# Patient Record
Sex: Female | Born: 1950 | Race: White | Hispanic: No | Smoking: Current every day smoker
Health system: Southern US, Community
[De-identification: ages and names within clinical notes are randomized; demographics above are authoritative.]

## PROBLEM LIST (undated history)

## (undated) DIAGNOSIS — M1990 Unspecified osteoarthritis, unspecified site: Secondary | ICD-10-CM

## (undated) DIAGNOSIS — M069 Rheumatoid arthritis, unspecified: Secondary | ICD-10-CM

## (undated) DIAGNOSIS — N184 Chronic kidney disease, stage 4 (severe): Secondary | ICD-10-CM

## (undated) DIAGNOSIS — I10 Essential (primary) hypertension: Secondary | ICD-10-CM

## (undated) DIAGNOSIS — Z720 Tobacco use: Secondary | ICD-10-CM

---

## 2005-09-30 ENCOUNTER — Ambulatory Visit: Payer: Self-pay | Admitting: Gastroenterology

## 2005-11-11 ENCOUNTER — Encounter: Payer: Self-pay | Admitting: Specialist

## 2005-11-22 ENCOUNTER — Encounter: Payer: Self-pay | Admitting: Specialist

## 2006-08-22 ENCOUNTER — Ambulatory Visit: Payer: Self-pay | Admitting: Orthopaedic Surgery

## 2006-09-14 ENCOUNTER — Other Ambulatory Visit: Payer: Self-pay

## 2006-09-14 ENCOUNTER — Ambulatory Visit: Payer: Self-pay | Admitting: Orthopaedic Surgery

## 2006-09-19 ENCOUNTER — Ambulatory Visit: Payer: Self-pay | Admitting: Orthopaedic Surgery

## 2007-12-07 IMAGING — CR DG SHOULDER 3+V*R*
1 series · 2 of 2 positions shown · non-contrast
Comparison: none

REASON FOR EXAM: Status post right shoulder hemiarthroplasty
COMMENTS:   Bedside (portable):Y

PROCEDURE:     DXR - DXR SHOULDER RIGHT COMPLETE  - September 19, 2006  [DATE]
RESULT:     The patient has undergone RIGHT shoulder hemiarthroplasty.
Radiographic positioning of the prosthetic components is good. Orthopedic
skin staples are present. The native bones are osteopenic.

[Series 1: view not recorded · 0.17mm/px · 2 of 2 slices shown]
[im 1/2]
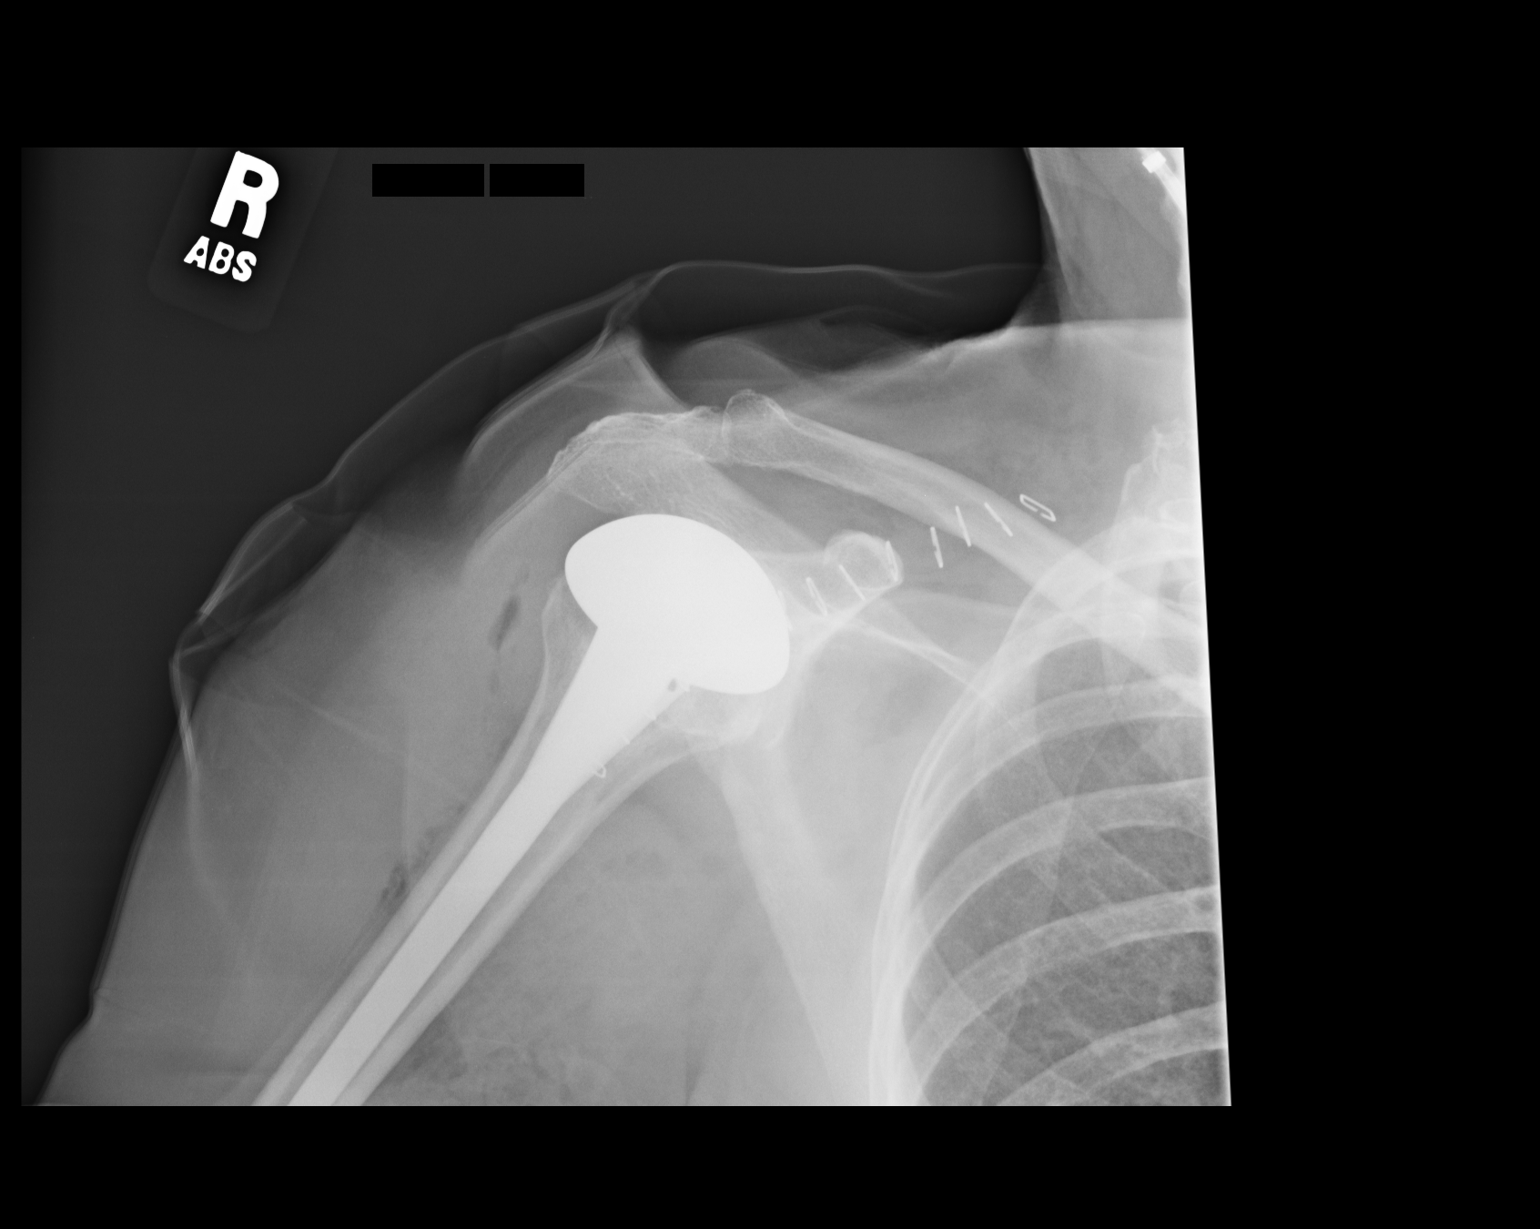
[im 2/2]
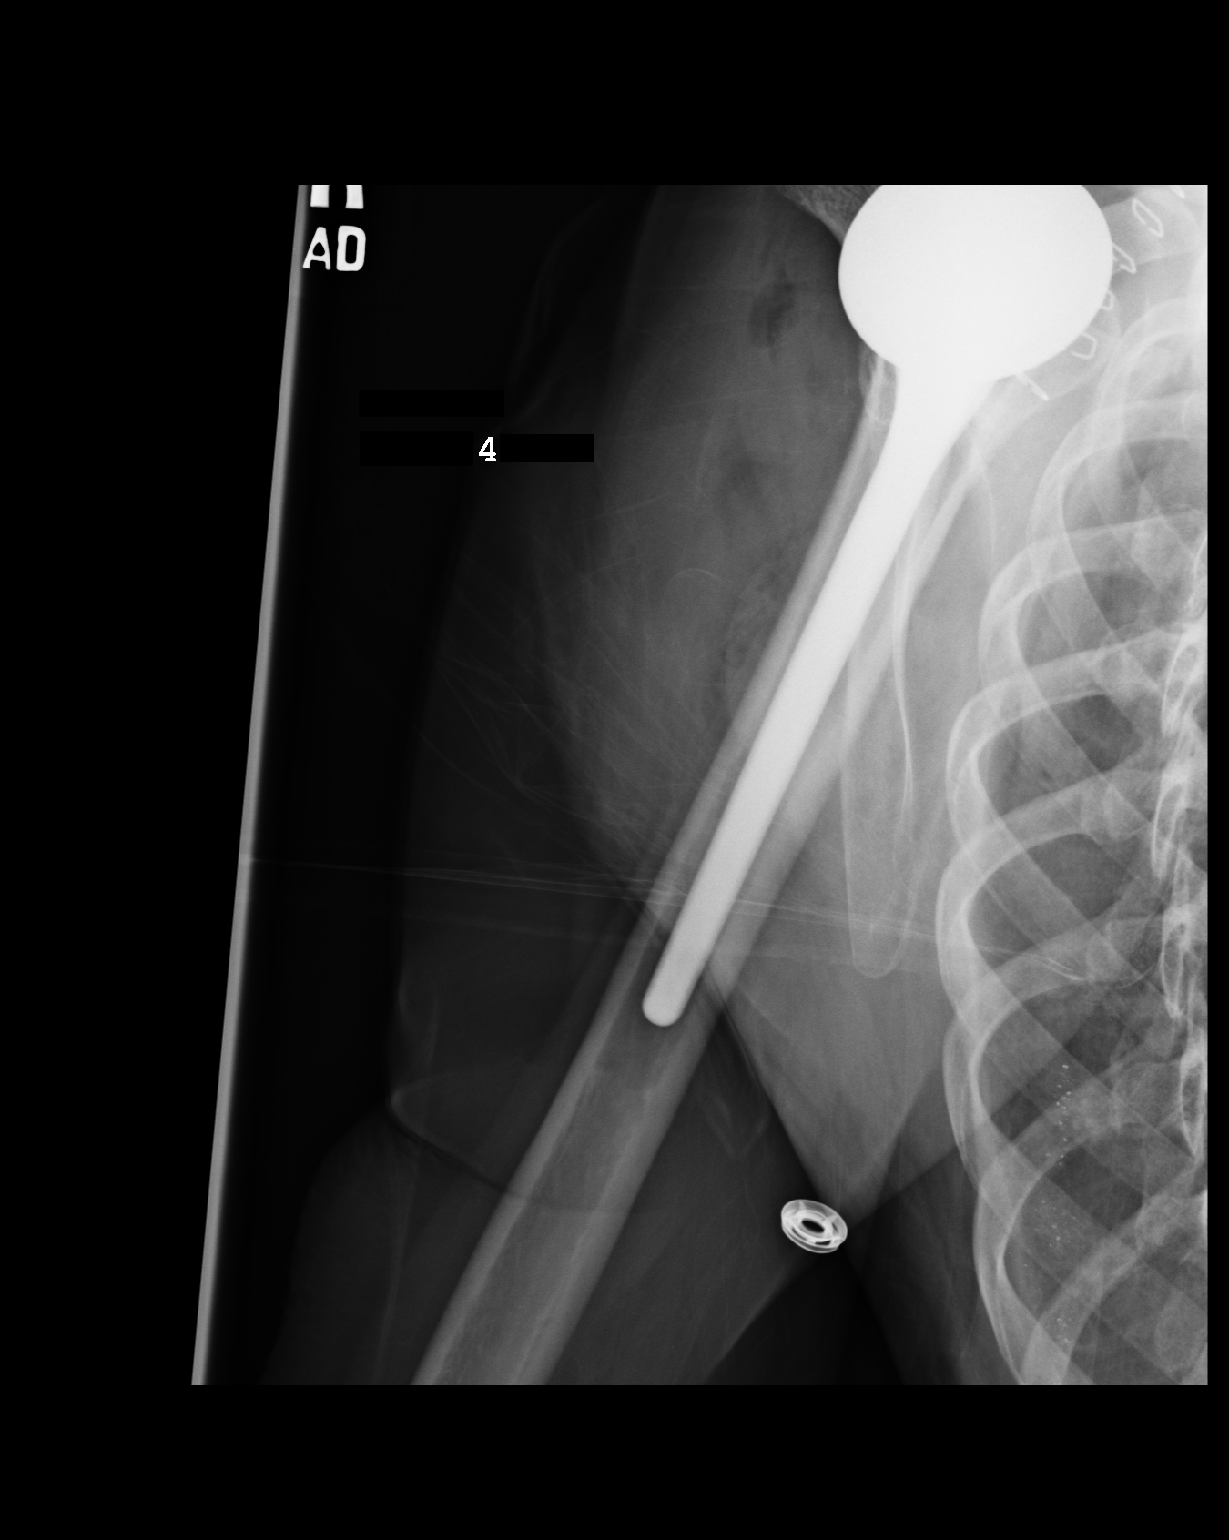

[2 of 2 positions shown; findings below may reference images not displayed]

IMPRESSION: The patient has undergone RIGHT shoulder hemiarthroplasty
with good radiographic positioning of the prosthetic components.

Further interpretation is deferred to Dr. Maria.

## 2008-02-27 ENCOUNTER — Ambulatory Visit: Payer: Self-pay | Admitting: Internal Medicine

## 2008-02-27 ENCOUNTER — Ambulatory Visit: Payer: Self-pay | Admitting: Ophthalmology

## 2008-03-04 ENCOUNTER — Ambulatory Visit: Payer: Self-pay | Admitting: Ophthalmology

## 2010-10-19 ENCOUNTER — Ambulatory Visit: Payer: Self-pay | Admitting: Ophthalmology

## 2010-11-02 ENCOUNTER — Ambulatory Visit: Payer: Self-pay | Admitting: Ophthalmology

## 2011-09-02 ENCOUNTER — Ambulatory Visit: Payer: Self-pay | Admitting: Rheumatology

## 2011-09-08 ENCOUNTER — Ambulatory Visit: Payer: Self-pay | Admitting: Internal Medicine

## 2011-09-23 ENCOUNTER — Ambulatory Visit: Payer: Self-pay | Admitting: Rheumatology

## 2011-11-03 DIAGNOSIS — M1990 Unspecified osteoarthritis, unspecified site: Secondary | ICD-10-CM | POA: Insufficient documentation

## 2012-10-17 ENCOUNTER — Ambulatory Visit: Payer: Self-pay | Admitting: Unknown Physician Specialty

## 2012-12-12 ENCOUNTER — Ambulatory Visit: Payer: Self-pay | Admitting: Unknown Physician Specialty

## 2013-01-20 ENCOUNTER — Emergency Department: Payer: Self-pay | Admitting: Emergency Medicine

## 2013-01-23 ENCOUNTER — Other Ambulatory Visit: Payer: Self-pay | Admitting: Unknown Physician Specialty

## 2013-01-24 LAB — WOUND CULTURE

## 2013-01-26 ENCOUNTER — Ambulatory Visit: Payer: Self-pay | Admitting: Unknown Physician Specialty

## 2013-01-26 ENCOUNTER — Emergency Department: Payer: Self-pay | Admitting: Emergency Medicine

## 2013-01-26 ENCOUNTER — Ambulatory Visit: Payer: Self-pay

## 2013-01-26 LAB — COMPREHENSIVE METABOLIC PANEL
Alkaline Phosphatase: 39 U/L — ABNORMAL LOW
BUN: 8 mg/dL (ref 7–18)
Bilirubin,Total: 0.4 mg/dL (ref 0.2–1.0)
Calcium, Total: 9.1 mg/dL (ref 8.5–10.1)
Chloride: 100 mmol/L (ref 98–107)
Creatinine: 0.53 mg/dL — ABNORMAL LOW (ref 0.60–1.30)
EGFR (Non-African Amer.): >60
Osmolality: 271 (ref 275–301)
Potassium: 4.2 mmol/L (ref 3.5–5.1)
Sodium: 135 mmol/L — ABNORMAL LOW (ref 136–145)
Total Protein: 6.1 g/dL — ABNORMAL LOW (ref 6.4–8.2)

## 2013-01-26 LAB — CBC WITH DIFFERENTIAL/PLATELET
Basophil %: 0.3 %
Eosinophil #: 0 10*3/uL (ref 0.0–0.7)
Eosinophil %: 0 %
HCT: 37.9 % (ref 35.0–47.0)
HGB: 12.9 g/dL (ref 12.0–16.0)
Lymphocyte %: 6.1 %
MCHC: 34.2 g/dL (ref 32.0–36.0)
Monocyte %: 0.6 %
Neutrophil %: 93 %
RBC: 3.8 10*6/uL (ref 3.80–5.20)
WBC: 9.1 10*3/uL (ref 3.6–11.0)

## 2013-01-26 LAB — SEDIMENTATION RATE: Erythrocyte Sed Rate: 5 mm/hr (ref 0–30)

## 2013-01-27 ENCOUNTER — Other Ambulatory Visit: Payer: Self-pay

## 2013-01-27 LAB — CREATININE, SERUM
Creatinine: 0.8 mg/dL (ref 0.60–1.30)
EGFR (African American): >60

## 2013-01-27 LAB — BUN: BUN: 12 mg/dL (ref 7–18)

## 2013-02-09 ENCOUNTER — Other Ambulatory Visit: Payer: Self-pay | Admitting: Unknown Physician Specialty

## 2013-02-14 ENCOUNTER — Ambulatory Visit: Payer: Self-pay

## 2014-06-15 NOTE — Op Note (Signed)
PATIENT NAMEERLEAN, Lynn Norton MR#:  [id number] DATE OF BIRTH:  1950-12-22  DATE OF PROCEDURE:  01/26/2013  PREOPERATIVE DIAGNOSES: 1.  Knee infection.  2.  Lower extremity cellulitis.     PROCEDURES:  1. Ultrasound guidance for vascular access to right brachial vein.  2. Fluoroscopic guidance for placement of catheter.  3.  Insertion of single lumen PICC line, right arm.  SURGEON: Hortencia Pilar, MD   ANESTHESIA: Local.   ESTIMATED BLOOD LOSS: Minimal.   INDICATION FOR PROCEDURE: Requiring IV antibiotics, greater than 5 days.  DESCRIPTION OF PROCEDURE: The patient's right arm was sterilely prepped and draped, and a sterile surgical field was created. The brachial vein was accessed under direct ultrasound guidance without difficulty with a micropuncture needle and permanent image was recorded. 0.018 wire was then placed into the superior vena cava. Peel-away sheath was placed over the wire. A single lumen peripherally inserted central venous catheter was then placed over the wire and the wire and peel-away sheath were removed. The catheter tip was placed into the superior vena cava and was secured at the skin at 30 cm with a sterile dressing. The catheter withdrew blood well and flushed easily with heparinized saline. The patient tolerated procedure well.  ____________________________ Katha Cabal, MD ggs:dmm D: 02/02/2013 18:09:00 ET T: 02/02/2013 19:15:53 ET JOB#: [id number]  cc: Katha Cabal, MD, <Dictator> Katha Cabal MD ELECTRONICALLY SIGNED 02/27/2013 19:27

## 2014-08-12 DIAGNOSIS — F17200 Nicotine dependence, unspecified, uncomplicated: Secondary | ICD-10-CM | POA: Insufficient documentation

## 2018-03-15 ENCOUNTER — Other Ambulatory Visit (INDEPENDENT_AMBULATORY_CARE_PROVIDER_SITE_OTHER): Payer: Self-pay | Admitting: Vascular Surgery

## 2018-03-15 DIAGNOSIS — I739 Peripheral vascular disease, unspecified: Secondary | ICD-10-CM

## 2018-03-16 ENCOUNTER — Ambulatory Visit (INDEPENDENT_AMBULATORY_CARE_PROVIDER_SITE_OTHER): Payer: Medicare Other

## 2018-03-16 ENCOUNTER — Ambulatory Visit (INDEPENDENT_AMBULATORY_CARE_PROVIDER_SITE_OTHER): Payer: Medicare Other | Admitting: Nurse Practitioner

## 2018-03-16 ENCOUNTER — Encounter (INDEPENDENT_AMBULATORY_CARE_PROVIDER_SITE_OTHER): Payer: Self-pay | Admitting: Nurse Practitioner

## 2018-03-16 VITALS — BP 138/77 | HR 74 | Resp 16 | Ht 62.0 in | Wt 115.0 lb

## 2018-03-16 DIAGNOSIS — F17210 Nicotine dependence, cigarettes, uncomplicated: Secondary | ICD-10-CM

## 2018-03-16 DIAGNOSIS — I10 Essential (primary) hypertension: Secondary | ICD-10-CM | POA: Diagnosis not present

## 2018-03-16 DIAGNOSIS — I739 Peripheral vascular disease, unspecified: Secondary | ICD-10-CM

## 2018-03-16 DIAGNOSIS — F17200 Nicotine dependence, unspecified, uncomplicated: Secondary | ICD-10-CM

## 2018-03-16 NOTE — Progress Notes (Signed)
Subjective:    Patient ID: Lynn Norton, female    DOB: 1950/07/18, 68 y.o.   MRN: [id number] Chief Complaint  Patient presents with  . Establish Care    HPI  Lynn Norton is a 68 y.o. female presents today for peripheral vascular disease evaluation following a annual wellness visit screening.  She states that the company that performs evaluations put something on her toe which showed that she had low blood flow which could possibly indicate a clot.  The patient has a longstanding history of rheumatoid arthritis, therefore she suffers from constant aches and pains.  However she denies any claudication-like symptoms or rest pain like symptoms.  She denies any chest pain or shortness of breath she denies any fever, chills, nausea, vomiting or diarrhea.  The patient denies any previous history of peripheral vascular interventions.  The patient is a current daily smoker.  The patient underwent bilateral ABIs today which reveal an ABI of 1.13 on the right and 1.03 on the left.  She has triphasic tibial artery waveforms bilaterally.  There are no previous studies for comparison.  No past medical history on file.  History reviewed. No pertinent surgical history.  Social History   Socioeconomic History  . Marital status: Married    Spouse name: Not on file  . Number of children: Not on file  . Years of education: Not on file  . Highest education level: Not on file  Occupational History  . Not on file  Social Needs  . Financial resource strain: Not on file  . Food insecurity:    Worry: Not on file    Inability: Not on file  . Transportation needs:    Medical: Not on file    Non-medical: Not on file  Tobacco Use  . Smoking status: Current Every Day Smoker    Packs/day: 1.00    Types: Cigarettes  . Smokeless tobacco: Never Used  Substance and Sexual Activity  . Alcohol use: Not on file  . Drug use: Never  . Sexual activity: Not on file  Lifestyle  . Physical activity:   Days per week: Not on file    Minutes per session: Not on file  . Stress: Not on file  Relationships  . Social connections:    Talks on phone: Not on file    Gets together: Not on file    Attends religious service: Not on file    Active member of club or organization: Not on file    Attends meetings of clubs or organizations: Not on file    Relationship status: Not on file  . Intimate partner violence:    Fear of current or ex partner: Not on file    Emotionally abused: Not on file    Physically abused: Not on file    Forced sexual activity: Not on file  Other Topics Concern  . Not on file  Social History Narrative  . Not on file    No family history on file.  Allergies  Allergen Reactions  . Sulfa Antibiotics Other (See Comments) and Itching  . Ceftriaxone Rash  . Penicillins Rash and Other (See Comments)     Review of Systems   Review of Systems: Negative Unless Checked Constitutional: [] Weight loss  [] Fever  [] Chills Cardiac: [] Chest pain   []  Atrial Fibrillation  [] Palpitations   [] Shortness of breath when laying flat   [] Shortness of breath with exertion. [] Shortness of breath at rest Vascular:  [] Pain in legs with walking   []   Pain in legs with standing [] Pain in legs when laying flat   [] Claudication    [] Pain in feet when laying flat    [] History of DVT   [] Phlebitis   [] Swelling in legs   [] Varicose veins   [] Non-healing ulcers Pulmonary:   [] Uses home oxygen   [] Productive cough   [] Hemoptysis   [] Wheeze  [] COPD   [] Asthma Neurologic:  [] Dizziness   [] Seizures  [] Blackouts [] History of stroke   [] History of TIA  [] Aphasia   [] Temporary Blindness   [] Weakness or numbness in arm   [x] Weakness or numbness in leg Musculoskeletal:   [x] Joint swelling   [x] Joint pain   [] Low back pain  []  History of Knee Replacement [x] Arthritis [] back Surgeries  []  Spinal Stenosis    Hematologic:  [] Easy bruising  [] Easy bleeding   [] Hypercoagulable state   [] Anemic Gastrointestinal:   [] Diarrhea   [] Vomiting  [] Gastroesophageal reflux/heartburn   [] Difficulty swallowing. [] Abdominal pain Genitourinary:  [] Chronic kidney disease   [] Difficult urination  [] Anuric   [] Blood in urine [] Frequent urination  [] Burning with urination   [] Hematuria Skin:  [] Rashes   [] Ulcers [] Wounds Psychological:  [] History of anxiety   []  History of major depression  []  Memory Difficulties     Objective:   Physical Exam  BP 138/77 (BP Location: Right Arm, Patient Position: Sitting, Cuff Size: Normal)   Pulse 74   Resp 16   Ht 5\' 2"  (1.575 m)   Wt 115 lb (52.2 kg)   BMI 21.03 kg/m   Gen: WD/WN, NAD Head: [place]/AT, No temporalis wasting.  Ear/Nose/Throat: Hearing grossly intact, nares w/o erythema or drainage Eyes: PER, EOMI, sclera nonicteric.  Neck: Supple, no masses.  No JVD.  Pulmonary:  Good air movement, no use of accessory muscles.  Cardiac: RRR Vascular:  Vessel Right Left  Radial Palpable Palpable  Dorsalis Pedis Palpable Palpable  Posterior Tibial Palpable Palpable   Gastrointestinal: soft, non-distended. No guarding/no peritoneal signs.  Musculoskeletal: M/S 5/5 throughout.  No deformity or atrophy.  Neurologic: Pain and light touch intact in extremities.  Symmetrical.  Speech is fluent. Motor exam as listed above. Psychiatric: Judgment intact, Mood & affect appropriate for pt's clinical situation. Dermatologic: No Venous rashes. No Ulcers Noted.  No changes consistent with cellulitis. Lymph : No Cervical lymphadenopathy, no lichenification or skin changes of chronic lymphedema.      Assessment & Plan:   1. PAD (peripheral artery disease) (HCC) Currently based on the ABIs that were done today it does not appear that the patient has peripheral artery disease.  She denies any hallmark pain complaints that he would exhibit with peripheral artery disease.  She has strong pulses on physical assessment.  Patient's largest risk factor for peripheral artery disease at this time  is smoking.  She has been cautioned to stop smoking as it can cause issues with peripheral arterial disease in the future.  Caution the patient that should she develop any lower extremity wounds, claudication-like symptoms, sudden coolness or discoloration of her lower extremities she should feel free to contact our office for further assessment.  However at this time she will follow-up on a as needed basis.  2. Essential hypertension Continue antihypertensive medications as already ordered, these medications have been reviewed and there are no changes at this time.   3. Tobacco use disorder Smoking cessation was discussed, 3-10 minutes spent on this topic specifically.  Cautioned that continued smoking mainly to peripheral artery disease at some point.     Current Outpatient  Medications on File Prior to Visit  Medication Sig Dispense Refill  . amLODipine (NORVASC) 5 MG tablet Take 5 mg by mouth daily.    . Colchicine (MITIGARE) 0.6 MG CAPS Take by mouth daily.    . cyclobenzaprine (FLEXERIL) 10 MG tablet Take 10 mg by mouth at bedtime.    Marland Kitchen doxycycline (VIBRAMYCIN) 100 MG capsule Take 100 mg by mouth daily.    . folic acid (FOLVITE) 1 MG tablet Take 1 mg by mouth daily.    . meloxicam (MOBIC) 15 MG tablet Take 15 mg by mouth daily.    . methotrexate (RHEUMATREX) 2.5 MG tablet Take by mouth once a week.    . predniSONE (DELTASONE) 2.5 MG tablet Take 2.5 mg by mouth daily.    . traZODone (DESYREL) 50 MG tablet TAKE 1 TABLET BY MOUTH  NIGHTLY AS NEEDED FOR SLEEP    . zolpidem (AMBIEN) 10 MG tablet Take by mouth as needed.     No current facility-administered medications on file prior to visit.     There are no Patient Instructions on file for this visit. No follow-ups on file.   Kris Hartmann, NP  This note was completed with Sales executive.  Any errors are purely unintentional.

## 2018-03-16 NOTE — Assessment & Plan Note (Signed)
Currently based on the ABIs that were done today it does not appear that the patient has peripheral artery disease.  She denies any hallmark pain complaints that he would exhibit with peripheral artery disease.  She has strong pulses on physical assessment.  Patient's largest risk factor for peripheral artery disease at this time is smoking.  She has been cautioned to stop smoking as it can cause issues with peripheral arterial disease in the future.  Caution the patient that should she develop any lower extremity wounds, claudication-like symptoms, sudden coolness or discoloration of her lower extremities she should feel free to contact our office for further assessment.  However at this time she will follow-up on a as needed basis.

## 2018-04-27 ENCOUNTER — Encounter (INDEPENDENT_AMBULATORY_CARE_PROVIDER_SITE_OTHER): Payer: Self-pay

## 2019-04-27 ENCOUNTER — Ambulatory Visit: Payer: Medicare Other | Attending: Internal Medicine

## 2019-04-27 DIAGNOSIS — Z23 Encounter for immunization: Secondary | ICD-10-CM

## 2019-04-27 NOTE — Progress Notes (Signed)
   Covid-19 Vaccination Clinic  Name:  Aubree Doody    MRN: [id number] DOB: [birth year]  04/27/2019  Ms. Reiger was observed post Covid-19 immunization for 15 minutes without incident. She was provided with Vaccine Information Sheet and instruction to access the V-Safe system.   Ms. Weyand was instructed to call 911 with any severe reactions post vaccine: Marland Kitchen Difficulty breathing  . Swelling of face and throat  . A fast heartbeat  . A bad rash all over body  . Dizziness and weakness   Immunizations Administered    Name Date Dose VIS Date Route   Pfizer COVID-19 Vaccine 04/27/2019  9:34 AM 0.3 mL 02/02/2019 Intramuscular   Manufacturer: [place]   Lot: [id number]   [place]: [id number]

## 2019-05-16 ENCOUNTER — Ambulatory Visit: Payer: Medicare Other | Attending: Internal Medicine

## 2019-05-16 DIAGNOSIS — Z23 Encounter for immunization: Secondary | ICD-10-CM

## 2019-05-16 NOTE — Progress Notes (Signed)
   Covid-19 Vaccination Clinic  Name:  Lynn Norton    MRN: [id number] DOB: 05-15-1950  05/16/2019  [name] was observed post Covid-19 immunization for 15 minutes without incident. She was provided with Vaccine Information Sheet and instruction to access the V-Safe system.   [name] was instructed to call 911 with any severe reactions post vaccine: Marland Kitchen Difficulty breathing  . Swelling of face and throat  . A fast heartbeat  . A bad rash all over body  . Dizziness and weakness   Immunizations Administered    Name Date Dose VIS Date Route   Pfizer COVID-19 Vaccine 05/16/2019  9:28 AM 0.3 mL 02/02/2019 Intramuscular   Manufacturer: [place]   Lot: [id number]   [place]: [id number]

## 2019-05-18 ENCOUNTER — Ambulatory Visit: Payer: Medicare Other

## 2019-05-19 ENCOUNTER — Ambulatory Visit: Payer: Medicare Other

## 2019-10-15 ENCOUNTER — Ambulatory Visit: Payer: Self-pay | Attending: Internal Medicine

## 2019-10-15 DIAGNOSIS — Z23 Encounter for immunization: Secondary | ICD-10-CM

## 2019-10-15 NOTE — Progress Notes (Signed)
   Covid-19 Vaccination Clinic  Name:  Lynn Norton    MRN: [id number] DOB: February 13, 1951  10/15/2019  Lynn Norton was observed post Covid-19 immunization for 15 minutes without incident. She was provided with Vaccine Information Sheet and instruction to access the V-Safe system.   Lynn Norton was instructed to call 911 with any severe reactions post vaccine: Marland Kitchen Difficulty breathing  . Swelling of face and throat  . A fast heartbeat  . A bad rash all over body  . Dizziness and weakness

## 2021-04-22 ENCOUNTER — Other Ambulatory Visit: Payer: Self-pay

## 2021-04-22 ENCOUNTER — Emergency Department: Payer: Medicare Other

## 2021-04-22 ENCOUNTER — Inpatient Hospital Stay
Admission: EM | Admit: 2021-04-22 | Discharge: 2021-04-29 | DRG: 854 | Disposition: A | Discharge status: Skilled Nursing Facility | Payer: Medicare Other | Attending: Internal Medicine | Admitting: Internal Medicine

## 2021-04-22 DIAGNOSIS — M069 Rheumatoid arthritis, unspecified: Secondary | ICD-10-CM

## 2021-04-22 DIAGNOSIS — N390 Urinary tract infection, site not specified: Secondary | ICD-10-CM

## 2021-04-22 DIAGNOSIS — Z720 Tobacco use: Secondary | ICD-10-CM

## 2021-04-22 DIAGNOSIS — B9620 Unspecified Escherichia coli [E. coli] as the cause of diseases classified elsewhere: Secondary | ICD-10-CM | POA: Insufficient documentation

## 2021-04-22 DIAGNOSIS — S42401A Unspecified fracture of lower end of right humerus, initial encounter for closed fracture: Secondary | ICD-10-CM

## 2021-04-22 DIAGNOSIS — E871 Hypo-osmolality and hyponatremia: Secondary | ICD-10-CM

## 2021-04-22 DIAGNOSIS — W19XXXA Unspecified fall, initial encounter: Principal | ICD-10-CM

## 2021-04-22 DIAGNOSIS — I10 Essential (primary) hypertension: Secondary | ICD-10-CM

## 2021-04-22 DIAGNOSIS — R531 Weakness: Secondary | ICD-10-CM

## 2021-04-22 DIAGNOSIS — R7881 Bacteremia: Secondary | ICD-10-CM

## 2021-04-22 DIAGNOSIS — A419 Sepsis, unspecified organism: Secondary | ICD-10-CM

## 2021-04-22 DIAGNOSIS — N179 Acute kidney failure, unspecified: Secondary | ICD-10-CM

## 2021-04-22 DIAGNOSIS — R296 Repeated falls: Secondary | ICD-10-CM

## 2021-04-22 DIAGNOSIS — Z681 Body mass index (BMI) 19 or less, adult: Secondary | ICD-10-CM

## 2021-04-22 DIAGNOSIS — Z880 Allergy status to penicillin: Secondary | ICD-10-CM | POA: Diagnosis not present

## 2021-04-22 DIAGNOSIS — W1811XA Fall from or off toilet without subsequent striking against object, initial encounter: Secondary | ICD-10-CM | POA: Diagnosis present

## 2021-04-22 DIAGNOSIS — Z7952 Long term (current) use of systemic steroids: Secondary | ICD-10-CM

## 2021-04-22 DIAGNOSIS — A4151 Sepsis due to Escherichia coli [E. coli]: Principal | ICD-10-CM | POA: Diagnosis present

## 2021-04-22 DIAGNOSIS — S52024A Nondisplaced fracture of olecranon process without intraarticular extension of right ulna, initial encounter for closed fracture: Secondary | ICD-10-CM | POA: Diagnosis present

## 2021-04-22 DIAGNOSIS — E46 Unspecified protein-calorie malnutrition: Secondary | ICD-10-CM | POA: Diagnosis present

## 2021-04-22 DIAGNOSIS — Z20822 Contact with and (suspected) exposure to covid-19: Secondary | ICD-10-CM | POA: Diagnosis present

## 2021-04-22 DIAGNOSIS — Z881 Allergy status to other antibiotic agents status: Secondary | ICD-10-CM

## 2021-04-22 DIAGNOSIS — F17210 Nicotine dependence, cigarettes, uncomplicated: Secondary | ICD-10-CM | POA: Diagnosis present

## 2021-04-22 DIAGNOSIS — Z888 Allergy status to other drugs, medicaments and biological substances status: Secondary | ICD-10-CM

## 2021-04-22 DIAGNOSIS — W1830XA Fall on same level, unspecified, initial encounter: Secondary | ICD-10-CM | POA: Diagnosis present

## 2021-04-22 DIAGNOSIS — Z791 Long term (current) use of non-steroidal anti-inflammatories (NSAID): Secondary | ICD-10-CM

## 2021-04-22 DIAGNOSIS — E860 Dehydration: Secondary | ICD-10-CM | POA: Diagnosis present

## 2021-04-22 DIAGNOSIS — Z882 Allergy status to sulfonamides status: Secondary | ICD-10-CM

## 2021-04-22 DIAGNOSIS — M810 Age-related osteoporosis without current pathological fracture: Secondary | ICD-10-CM | POA: Diagnosis present

## 2021-04-22 DIAGNOSIS — Y92012 Bathroom of single-family (private) house as the place of occurrence of the external cause: Secondary | ICD-10-CM

## 2021-04-22 DIAGNOSIS — Y9389 Activity, other specified: Secondary | ICD-10-CM

## 2021-04-22 DIAGNOSIS — S52021A Displaced fracture of olecranon process without intraarticular extension of right ulna, initial encounter for closed fracture: Secondary | ICD-10-CM | POA: Diagnosis present

## 2021-04-22 DIAGNOSIS — Z96611 Presence of right artificial shoulder joint: Secondary | ICD-10-CM | POA: Diagnosis present

## 2021-04-22 HISTORY — DX: Unspecified osteoarthritis, unspecified site: M19.90

## 2021-04-22 LAB — CBC WITH DIFFERENTIAL/PLATELET
Abs Immature Granulocytes: 0.8 10*3/uL — ABNORMAL HIGH (ref 0.00–0.07)
Basophils Absolute: 0.1 10*3/uL (ref 0.0–0.1)
Basophils Relative: 0 %
Eosinophils Absolute: 0.1 10*3/uL (ref 0.0–0.5)
Eosinophils Relative: 0 %
HCT: 35.6 % — ABNORMAL LOW (ref 36.0–46.0)
Hemoglobin: 12.5 g/dL (ref 12.0–15.0)
Immature Granulocytes: 3 %
Lymphocytes Relative: 3 %
Lymphs Abs: 0.8 10*3/uL (ref 0.7–4.0)
MCH: 34.5 pg — ABNORMAL HIGH (ref 26.0–34.0)
MCHC: 35.1 g/dL (ref 30.0–36.0)
MCV: 98.3 fL (ref 80.0–100.0)
Monocytes Absolute: 2.9 10*3/uL — ABNORMAL HIGH (ref 0.1–1.0)
Monocytes Relative: 10 %
Neutro Abs: 25.9 10*3/uL — ABNORMAL HIGH (ref 1.7–7.7)
Neutrophils Relative %: 84 %
Platelets: 253 10*3/uL (ref 150–400)
RBC: 3.62 MIL/uL — ABNORMAL LOW (ref 3.87–5.11)
RDW: 14 % (ref 11.5–15.5)
Smear Review: NORMAL
WBC: 30.7 10*3/uL — ABNORMAL HIGH (ref 4.0–10.5)
nRBC: 0 % (ref 0.0–0.2)

## 2021-04-22 LAB — COMPREHENSIVE METABOLIC PANEL
ALT: 30 U/L (ref 0–44)
AST: 25 U/L (ref 15–41)
Albumin: 2.6 g/dL — ABNORMAL LOW (ref 3.5–5.0)
Alkaline Phosphatase: 102 U/L (ref 38–126)
Anion gap: 9 (ref 5–15)
BUN: 34 mg/dL — ABNORMAL HIGH (ref 8–23)
CO2: 24 mmol/L (ref 22–32)
Calcium: 7.3 mg/dL — ABNORMAL LOW (ref 8.9–10.3)
Chloride: 97 mmol/L — ABNORMAL LOW (ref 98–111)
Creatinine, Ser: 2.1 mg/dL — ABNORMAL HIGH (ref 0.44–1.00)
GFR, Estimated: 24 mL/min — ABNORMAL LOW (ref >60)
Glucose, Bld: 129 mg/dL — ABNORMAL HIGH (ref 70–99)
Potassium: 4.4 mmol/L (ref 3.5–5.1)
Sodium: 130 mmol/L — ABNORMAL LOW (ref 135–145)
Total Bilirubin: 0.8 mg/dL (ref 0.3–1.2)
Total Protein: 6.1 g/dL — ABNORMAL LOW (ref 6.5–8.1)

## 2021-04-22 LAB — SURGICAL PCR SCREEN
MRSA, PCR: NEGATIVE
Staphylococcus aureus: NEGATIVE

## 2021-04-22 LAB — URINALYSIS, ROUTINE W REFLEX MICROSCOPIC
Bilirubin Urine: NEGATIVE
Glucose, UA: NEGATIVE mg/dL
Ketones, ur: NEGATIVE mg/dL
Nitrite: POSITIVE — AB
Protein, ur: 30 mg/dL — AB
Specific Gravity, Urine: 1.008 (ref 1.005–1.030)
Squamous Epithelial / HPF: NONE SEEN (ref 0–5)
WBC, UA: >50 WBC/hpf — ABNORMAL HIGH (ref 0–5)
pH: 6 (ref 5.0–8.0)

## 2021-04-22 LAB — RESP PANEL BY RT-PCR (FLU A&B, COVID) ARPGX2
Influenza A by PCR: NEGATIVE
Influenza B by PCR: NEGATIVE
SARS Coronavirus 2 by RT PCR: NEGATIVE

## 2021-04-22 LAB — PROCALCITONIN: Procalcitonin: 3.77 ng/mL

## 2021-04-22 LAB — CK: Total CK: 124 U/L (ref 38–234)

## 2021-04-22 LAB — MRSA NEXT GEN BY PCR, NASAL: MRSA by PCR Next Gen: NOT DETECTED

## 2021-04-22 LAB — LACTIC ACID, PLASMA: Lactic Acid, Venous: 1.4 mmol/L (ref 0.5–1.9)

## 2021-04-22 LAB — TROPONIN I (HIGH SENSITIVITY)
Troponin I (High Sensitivity): 23 ng/L — ABNORMAL HIGH (ref <18)
Troponin I (High Sensitivity): 14 ng/L (ref <18)

## 2021-04-22 MED ORDER — SODIUM CHLORIDE 0.9 % IV SOLN: 1.0000 g | INTRAVENOUS | Status: DC

## 2021-04-22 MED ORDER — LACTATED RINGERS IV BOLUS (SEPSIS)
1000.0000 mL | Freq: Once | INTRAVENOUS | Status: AC
  Administered 2021-04-22: 1000 mL via INTRAVENOUS

## 2021-04-22 MED ORDER — MORPHINE SULFATE (PF) 2 MG/ML IV SOLN
2.0000 mg | INTRAVENOUS | Status: DC | PRN
  Administered 2021-04-22 – 2021-04-29 (×3): 2 mg via INTRAVENOUS
  Filled 2021-04-22 (×3): qty 1

## 2021-04-22 MED ORDER — MUPIROCIN 2 % EX OINT
1.0000 "application " | TOPICAL_OINTMENT | Freq: Two times a day (BID) | CUTANEOUS | Status: DC
  Administered 2021-04-22: 1 via NASAL
  Filled 2021-04-22: qty 22

## 2021-04-22 MED ORDER — LEVOFLOXACIN IN D5W 500 MG/100ML IV SOLN: 500.0000 mg | INTRAVENOUS | Status: DC

## 2021-04-22 MED ORDER — POLYETHYLENE GLYCOL 3350 17 G PO PACK
17.0000 g | PACK | Freq: Every day | ORAL | Status: DC | PRN
  Administered 2021-04-24 – 2021-04-25 (×2): 17 g via ORAL
  Filled 2021-04-22 (×2): qty 1

## 2021-04-22 MED ORDER — LACTATED RINGERS IV SOLN: INTRAVENOUS | Status: AC

## 2021-04-22 MED ORDER — MORPHINE SULFATE (PF) 4 MG/ML IV SOLN
6.0000 mg | Freq: Once | INTRAVENOUS | Status: AC
  Administered 2021-04-22: 6 mg via INTRAVENOUS
  Filled 2021-04-22: qty 2

## 2021-04-22 MED ORDER — HEPARIN SODIUM (PORCINE) 5000 UNIT/ML IJ SOLN
5000.0000 [IU] | Freq: Three times a day (TID) | INTRAMUSCULAR | Status: DC
  Administered 2021-04-22 – 2021-04-29 (×20): 5000 [IU] via SUBCUTANEOUS
  Filled 2021-04-22 (×20): qty 1

## 2021-04-22 MED ORDER — ONDANSETRON HCL 4 MG/2ML IJ SOLN
4.0000 mg | Freq: Once | INTRAMUSCULAR | Status: AC
  Administered 2021-04-22: 4 mg via INTRAVENOUS
  Filled 2021-04-22: qty 2

## 2021-04-22 MED ORDER — OXYCODONE HCL 5 MG PO TABS
5.0000 mg | ORAL_TABLET | Freq: Four times a day (QID) | ORAL | Status: DC | PRN
  Administered 2021-04-23 – 2021-04-29 (×11): 5 mg via ORAL
  Filled 2021-04-22 (×11): qty 1

## 2021-04-22 MED ORDER — LEVOFLOXACIN IN D5W 750 MG/150ML IV SOLN
750.0000 mg | Freq: Once | INTRAVENOUS | Status: AC
  Administered 2021-04-22: 750 mg via INTRAVENOUS
  Filled 2021-04-22: qty 150

## 2021-04-22 NOTE — Assessment & Plan Note (Addendum)
Thought to be secondary to hypovolemic hyponatremia.  Improving .Marland Kitchen  Monitor BMP ?

## 2021-04-22 NOTE — Consult Note (Signed)
Reason for Consult: Right elbow fracture Referring Physician: Dr. Prescilla Sours Lynn Norton is an 71 y.o. female.  HPI: Patient is a somewhat frail 71 year old who suffered a fall last night.  She takes care of her husband at home and he is undergoing cancer treatments and is in worse condition than she is.  She uses a cane to ambulate and suffered a fall onto her right arm.  Today she is found brought to the emergency room where she is found to have a proximal ulna fracture.  She is now splinted and in a sling she is right-hand dominant  Past Medical History:  Diagnosis Date   Arthritis     History reviewed. No pertinent surgical history.  No family history on file.  Social History:  has no history on file for tobacco use, alcohol use, and drug use.  Allergies:  Allergies  Allergen Reactions   Sulfa Antibiotics    Penicillins Rash   Rocephin [Ceftriaxone] Rash    Medications: I have reviewed the patient's current medications.  Results for orders placed or performed during the hospital encounter of 04/22/21 (from the past 48 hour(s))  Comprehensive metabolic panel     Status: Abnormal   Collection Time: 04/22/21  7:34 AM  Result Value Ref Range   Sodium 130 (L) 135 - 145 mmol/L   Potassium 4.4 3.5 - 5.1 mmol/L   Chloride 97 (L) 98 - 111 mmol/L   CO2 24 22 - 32 mmol/L   Glucose, Bld 129 (H) 70 - 99 mg/dL    Comment: Glucose reference range applies only to samples taken after fasting for at least 8 hours.   BUN 34 (H) 8 - 23 mg/dL   Creatinine, Ser 2.10 (H) 0.44 - 1.00 mg/dL   Calcium 7.3 (L) 8.9 - 10.3 mg/dL   Total Protein 6.1 (L) 6.5 - 8.1 g/dL   Albumin 2.6 (L) 3.5 - 5.0 g/dL   AST 25 15 - 41 U/L   ALT 30 0 - 44 U/L   Alkaline Phosphatase 102 38 - 126 U/L   Total Bilirubin 0.8 0.3 - 1.2 mg/dL   GFR, Estimated 24 (L) >60 mL/min    Comment: (NOTE) Calculated using the CKD-EPI Creatinine Equation (2021)    Anion gap 9 5 - 15    Comment: Performed at [facility], [address] [place], [place] [address]  Lactic acid, plasma     Status: None   Collection Time: 04/22/21  7:34 AM  Result Value Ref Range   Lactic Acid, Venous 1.4 0.5 - 1.9 mmol/L    Comment: Performed at [facility], [place], [place] [address]  Troponin I (High Sensitivity)     Status: Abnormal   Collection Time: 04/22/21  7:34 AM  Result Value Ref Range   Troponin I (High Sensitivity) 23 (H) <18 ng/L    Comment: (NOTE) Elevated high sensitivity troponin I (hsTnI) values and significant  changes across serial measurements may suggest ACS but many other  chronic and acute conditions are known to elevate hsTnI results.  Refer to the "Links" section for chest pain algorithms and additional  guidance. Performed at [facility], [address] [place], [place] [address]   CBC with Differential     Status: Abnormal   Collection Time: 04/22/21  7:34 AM  Result Value Ref Range   WBC 30.7 (H) 4.0 - 10.5 K/uL   RBC 3.62 (L) 3.87 - 5.11 MIL/uL   Hemoglobin  12.5 12.0 - 15.0 g/dL   HCT 35.6 (L) 36.0 - 46.0 %   MCV 98.3 80.0 - 100.0 fL   MCH 34.5 (H) 26.0 - 34.0 pg   MCHC 35.1 30.0 - 36.0 g/dL   RDW 14.0 11.5 - 15.5 %   Platelets 253 150 - 400 K/uL   nRBC 0.0 0.0 - 0.2 %   Neutrophils Relative % 84 %   Neutro Abs 25.9 (H) 1.7 - 7.7 K/uL   Lymphocytes Relative 3 %   Lymphs Abs 0.8 0.7 - 4.0 K/uL   Monocytes Relative 10 %   Monocytes Absolute 2.9 (H) 0.1 - 1.0 K/uL   Eosinophils Relative 0 %   Eosinophils Absolute 0.1 0.0 - 0.5 K/uL   Basophils Relative 0 %   Basophils Absolute 0.1 0.0 - 0.1 K/uL   WBC Morphology MORPHOLOGY UNREMARKABLE    RBC Morphology MORPHOLOGY UNREMARKABLE    Smear Review Normal platelet morphology    Immature Granulocytes 3 %   Abs Immature Granulocytes 0.80 (H) 0.00 - 0.07 K/uL    Comment: Performed at [facility], [address] [place], [place] [address]  CK     Status: None   Collection Time:  04/22/21  7:34 AM  Result Value Ref Range   Total CK 124 38 - 234 U/L    Comment: Performed at [facility], [address] [place], [place] [address]  Procalcitonin - Baseline     Status: None   Collection Time: 04/22/21  7:34 AM  Result Value Ref Range   Procalcitonin 3.77 ng/mL    Comment:        Interpretation: PCT > 2 ng/mL: Systemic infection (sepsis) is likely, unless other causes are known. (NOTE)       Sepsis PCT Algorithm           Lower Respiratory Tract                                      Infection PCT Algorithm    ----------------------------     ----------------------------         PCT < 0.25 ng/mL                PCT < 0.10 ng/mL          Strongly encourage             Strongly discourage   discontinuation of antibiotics    initiation of antibiotics    ----------------------------     -----------------------------       PCT 0.25 - 0.50 ng/mL            PCT 0.10 - 0.25 ng/mL               OR       >80% decrease in PCT            Discourage initiation of                                            antibiotics      Encourage discontinuation           of antibiotics    ----------------------------     -----------------------------         PCT >= 0.50 ng/mL  PCT 0.26 - 0.50 ng/mL               AND       <80% decrease in PCT              Encourage initiation of                                             antibiotics       Encourage continuation           of antibiotics    ----------------------------     -----------------------------        PCT >= 0.50 ng/mL                  PCT > 0.50 ng/mL               AND         increase in PCT                  Strongly encourage                                      initiation of antibiotics    Strongly encourage escalation           of antibiotics                                     -----------------------------                                           PCT <= 0.25 ng/mL                                                  OR                                        > 80% decrease in PCT                                      Discontinue / Do not initiate                                             antibiotics  Performed at [facility], [address] [place], [place] [address]   Urinalysis, Routine w reflex microscopic Urine, Clean Catch     Status: Abnormal   Collection Time: 04/22/21  9:29 AM  Result Value Ref Range   Color, Urine YELLOW (A) YELLOW   APPearance CLOUDY (A) CLEAR   Specific Gravity, Urine 1.008 1.005 - 1.030   pH 6.0 5.0 - 8.0   Glucose, UA NEGATIVE NEGATIVE mg/dL   Hgb  urine dipstick MODERATE (A) NEGATIVE   Bilirubin Urine NEGATIVE NEGATIVE   Ketones, ur NEGATIVE NEGATIVE mg/dL   Protein, ur 30 (A) NEGATIVE mg/dL   Nitrite POSITIVE (A) NEGATIVE   Leukocytes,Ua LARGE (A) NEGATIVE   RBC / HPF 0-5 0 - 5 RBC/hpf   WBC, UA >50 (H) 0 - 5 WBC/hpf   Bacteria, UA FEW (A) NONE SEEN   Squamous Epithelial / LPF NONE SEEN 0 - 5    Comment: Performed at [facility], [place], [place] [address]  Troponin I (High Sensitivity)     Status: None   Collection Time: 04/22/21 10:17 AM  Result Value Ref Range   Troponin I (High Sensitivity) 14 <18 ng/L    Comment: (NOTE) Elevated high sensitivity troponin I (hsTnI) values and significant  changes across serial measurements may suggest ACS but many other  chronic and acute conditions are known to elevate hsTnI results.  Refer to the "Links" section for chest pain algorithms and additional  guidance. Performed at [facility], [address], [place], [place] [address]     DG Shoulder Right  Result Date: 04/22/2021 CLINICAL DATA:  Status post fall.  Right shoulder pain. EXAM: RIGHT SHOULDER - 2+ VIEW COMPARISON:  None. FINDINGS: Post right humeral arthroplasty. No evidence of acute fracture. High riding prostatic humeral head on the glenoid, suggestive of chronic rotator cuff injury. The  visualized lung parenchyma is clear. IMPRESSION: 1. No acute fracture or dislocation identified about the right shoulder. 2. Post right humeral arthroplasty. 3. Chronic rotator cuff injury. Electronically Signed   By: Fidela Salisbury M.D.   On: 04/22/2021 08:39   DG Elbow Complete Right  Result Date: 04/22/2021 CLINICAL DATA:  Status post fall EXAM: RIGHT ELBOW - COMPLETE 3+ VIEW COMPARISON:  None. FINDINGS: Generalized osteopenia. Nondisplaced mildly comminuted fracture of right trochlea. No other fracture or dislocation. No aggressive osseous lesion. Mild osteoarthritis of the right elbow. No soft tissue abnormality. IMPRESSION: 1. Nondisplaced mildly comminuted fracture of right trochlea. Electronically Signed   By: Kathreen Devoid M.D.   On: 04/22/2021 08:37   CT Head Wo Contrast  Result Date: 04/22/2021 CLINICAL DATA:  Multiple falls over the last several days. EXAM: CT HEAD WITHOUT CONTRAST CT CERVICAL SPINE WITHOUT CONTRAST TECHNIQUE: Multidetector CT imaging of the head and cervical spine was performed following the standard protocol without intravenous contrast. Multiplanar CT image reconstructions of the cervical spine were also generated. RADIATION DOSE REDUCTION: This exam was performed according to the departmental dose-optimization program which includes automated exposure control, adjustment of the mA and/or kV according to patient size and/or use of iterative reconstruction technique. COMPARISON:  None. FINDINGS: Brain: No evidence of acute infarction, hemorrhage, extra-axial collection, ventriculomegaly, or mass effect. Generalized cerebral atrophy. Periventricular white matter low attenuation likely secondary to microangiopathy. Vascular: Cerebrovascular atherosclerotic calcifications are noted. No hyperdense vessels. Skull: Negative for fracture or focal lesion. Sinuses/Orbits: Visualized portions of the orbits are unremarkable. Visualized portions of the paranasal sinuses are unremarkable.  Visualized portions of the mastoid air cells are unremarkable. Other: None. CT CERVICAL SPINE FINDINGS Alignment: Normal. Skull base and vertebrae: No acute fracture. No primary bone lesion or focal pathologic process. Soft tissues and spinal canal: No prevertebral fluid or swelling. No visible canal hematoma. Disc levels: Degenerative disease with disc height loss at C4-5, C5-6, C6-7 and C7-T1. Mild bilateral facet arthropathy at C4-5, C5-6, C6-7 and C7-T1. Upper chest: Lung apices are clear. Other: No fluid collection or hematoma.  Bilateral carotid artery atherosclerosis. IMPRESSION: 1. No acute intracranial pathology. 2.  No acute osseous injury of the cervical spine. 3. Cervical spine spondylosis as described above. Electronically Signed   By: Kathreen Devoid M.D.   On: 04/22/2021 09:38   CT Cervical Spine Wo Contrast  Result Date: 04/22/2021 CLINICAL DATA:  Multiple falls over the last several days. EXAM: CT HEAD WITHOUT CONTRAST CT CERVICAL SPINE WITHOUT CONTRAST TECHNIQUE: Multidetector CT imaging of the head and cervical spine was performed following the standard protocol without intravenous contrast. Multiplanar CT image reconstructions of the cervical spine were also generated. RADIATION DOSE REDUCTION: This exam was performed according to the departmental dose-optimization program which includes automated exposure control, adjustment of the mA and/or kV according to patient size and/or use of iterative reconstruction technique. COMPARISON:  None. FINDINGS: Brain: No evidence of acute infarction, hemorrhage, extra-axial collection, ventriculomegaly, or mass effect. Generalized cerebral atrophy. Periventricular white matter low attenuation likely secondary to microangiopathy. Vascular: Cerebrovascular atherosclerotic calcifications are noted. No hyperdense vessels. Skull: Negative for fracture or focal lesion. Sinuses/Orbits: Visualized portions of the orbits are unremarkable. Visualized portions of the  paranasal sinuses are unremarkable. Visualized portions of the mastoid air cells are unremarkable. Other: None. CT CERVICAL SPINE FINDINGS Alignment: Normal. Skull base and vertebrae: No acute fracture. No primary bone lesion or focal pathologic process. Soft tissues and spinal canal: No prevertebral fluid or swelling. No visible canal hematoma. Disc levels: Degenerative disease with disc height loss at C4-5, C5-6, C6-7 and C7-T1. Mild bilateral facet arthropathy at C4-5, C5-6, C6-7 and C7-T1. Upper chest: Lung apices are clear. Other: No fluid collection or hematoma. Bilateral carotid artery atherosclerosis. IMPRESSION: 1. No acute intracranial pathology. 2.  No acute osseous injury of the cervical spine. 3. Cervical spine spondylosis as described above. Electronically Signed   By: Kathreen Devoid M.D.   On: 04/22/2021 09:38   DG Chest Portable 1 View  Result Date: 04/22/2021 CLINICAL DATA:  Multiple falls over the last several days. EXAM: PORTABLE CHEST 1 VIEW COMPARISON:  None. FINDINGS: No focal consolidation. No pleural effusion or pneumothorax. Heart and mediastinal contours are unremarkable. No acute osseous abnormality. Right shoulder arthroplasty. Loss of the normal left acromiohumeral distance as can be seen with a chronic rotator cuff tear. IMPRESSION: No active disease. Electronically Signed   By: Kathreen Devoid M.D.   On: 04/22/2021 08:38    Review of Systems Blood pressure (!) 112/58, pulse 96, temperature 97.9 F (36.6 C), temperature source Oral, resp. rate (!) 21, height 5\' 1"  (1.549 m), weight 45.8 kg, SpO2 (!) 89 %. Physical Exam She is in a splint and is able to flex and extend the fingers and thumb with intact sensation and palpable radial artery pulse. Assessment/Plan: She has a minimally displaced fracture of the proximal ulna going into the joint.  With her frail state of health and fracture pattern I think this would be amenable for single intramedullary screw as it would be less  invasive.  She will need to be in a sling postoperatively and not bear weight on this arm for at least 4 to 6 weeks.  This is not an urgent surgery and can wait until she is more medically stable.  Hessie Knows 04/22/2021, 11:32 AM

## 2021-04-22 NOTE — Assessment & Plan Note (Addendum)
Falling several times at home.  History of arthritis.  Ambulates with the help of cane.  Lives with husband.  PT/OT consulted, recommended skilled nursing facility on discharge ?

## 2021-04-22 NOTE — Assessment & Plan Note (Addendum)
-   No known history of CKD; no prior labs for comparison ?-Creatinine 2.1 on admission ?- Renal ultrasound negative for hydronephrosis or obstruction ?- Nephrology following ?- Now off fluids and tolerating diet well ?- BMP daily; creatinine improving slowly; some increase today, unsure significance or validity.  Repeat BMP this evening and resume IVF for now (per nephrology) ?

## 2021-04-22 NOTE — ED Provider Notes (Signed)
? ?[facility] ?Provider Note ? ? ? Event Date/Time  ? First MD Initiated Contact with Patient 04/22/21 [id number]   ?  (approximate) ? ? ?History  ? ?Fall ? ? ?HPI ? ?Lynn Norton is a 71 y.o. female who reports she has been weak and unsteady.  She is fallen several times.  Last night she felt and could not get up because she was too weak.  She complains of a lot of pain in his shoulder and elbow now.  Husband reports she is too weak for her to be cared for at home. ? ?  ? ? ?Physical Exam  ? ?Triage Vital Signs: ?ED Triage Vitals  ?Enc Vitals Group  ?   BP 04/22/21 0730 (!) 125/48  ?   Pulse Rate 04/22/21 0730 99  ?   Resp 04/22/21 0730 (!) 25  ?   Temp 04/22/21 0730 97.9 ?F (36.6 ?C)  ?   Temp Source 04/22/21 0730 Oral  ?   SpO2 04/22/21 0730 97 %  ?   Weight --   ?   Height 04/22/21 0731 5\' 1"  (1.549 m)  ?   Head Circumference --   ?   Peak Flow --   ?   Pain Score 04/22/21 0731 10  ?   Pain Loc --   ?   Pain Edu? --   ?   Excl. in [place]? --   ? ? ?Most recent vital signs: ?Vitals:  ? 04/22/21 1400 04/22/21 1430  ?BP: (!) 113/54 (!) 116/55  ?Pulse: 88 88  ?Resp: (!) 24 (!) 24  ?Temp:    ?SpO2: 94% 99%  ? ? ? ?General: Awake, no distress.  Does complain of a lot of pain when I touch her shoulder elbow ?Head normocephalic atraumatic ?Neck slightly tender to palpation ?CV:  Good peripheral perfusion.  Heart regular rate and rhythm no audible murmur ?Resp:  Normal effort.  Lungs are clear ?Abd:  No distention.  soft and nontender ?Extremities: Tender mass on palpation of the elbow and shoulder on the right side but no obvious bruising no swelling currently movement of either is painful as well. ? ? ?ED Results / Procedures / Treatments  ? ?Labs ?(all labs ordered are listed, but only abnormal results are displayed) ?Labs Reviewed  ?COMPREHENSIVE METABOLIC PANEL - Abnormal; Notable for the following components:  ?    Result Value  ? Sodium 130 (*)   ? Chloride 97 (*)   ? Glucose, Bld 129 (*)   ? BUN  34 (*)   ? Creatinine, Ser 2.10 (*)   ? Calcium 7.3 (*)   ? Total Protein 6.1 (*)   ? Albumin 2.6 (*)   ? GFR, Estimated 24 (*)   ? All other components within normal limits  ?CBC WITH DIFFERENTIAL/PLATELET - Abnormal; Notable for the following components:  ? WBC 30.7 (*)   ? RBC 3.62 (*)   ? HCT 35.6 (*)   ? MCH 34.5 (*)   ? Neutro Abs 25.9 (*)   ? Monocytes Absolute 2.9 (*)   ? Abs Immature Granulocytes 0.80 (*)   ? All other components within normal limits  ?URINALYSIS, ROUTINE W REFLEX MICROSCOPIC - Abnormal; Notable for the following components:  ? Color, Urine YELLOW (*)   ? APPearance CLOUDY (*)   ? Hgb urine dipstick MODERATE (*)   ? Protein, ur 30 (*)   ? Nitrite POSITIVE (*)   ? Leukocytes,Ua LARGE (*)   ?  WBC, UA >50 (*)   ? Bacteria, UA FEW (*)   ? All other components within normal limits  ?TROPONIN I (HIGH SENSITIVITY) - Abnormal; Notable for the following components:  ? Troponin I (High Sensitivity) 23 (*)   ? All other components within normal limits  ?CULTURE, BLOOD (SINGLE)  ?CULTURE, BLOOD (ROUTINE X 2)  ?CULTURE, BLOOD (ROUTINE X 2)  ?URINE CULTURE  ?RESP PANEL BY RT-PCR (FLU A&B, COVID) ARPGX2  ?LACTIC ACID, PLASMA  ?CK  ?PROCALCITONIN  ?TROPONIN I (HIGH SENSITIVITY)  ? ? ? ?EKG ? ?EKG read interpreted by me shows normal sinus rhythm rate of 99 left axis incomplete right bundle branch block some nonspecific ST-T changes ? ? ?RADIOLOGY ?Chest x-ray read by radiology reviewed by me shows no acute disease I reviewed the films ?Shoulder x-ray read by radiology and films reviewed by me show only the shoulder prosthesis no fractures ?Elbow x-ray read by radiology and reviewed by me again the films were reviewed by me shows a fracture of the [place] ? ? ?PROCEDURES: ? ?Critical Care performed: Medical care time 30 minutes.  This includes evaluating the patient carefully speaking with the patient and her husband reviewing the old records and current studies and  speaking with Dr. Rudene Christians the orthopedics.  I am speaking with the hospitalist. ? ?Procedures ? ? ?MEDICATIONS ORDERED IN ED: ?Medications  ?lactated ringers infusion (has no administration in time range)  ?heparin injection 5,000 Units (5,000 Units Subcutaneous Given 04/22/21 1436)  ?polyethylene glycol (MIRALAX / GLYCOLAX) packet 17 g (has no administration in time range)  ?morphine (PF) 2 MG/ML injection 2 mg (has no administration in time range)  ?oxyCODONE (Oxy IR/ROXICODONE) immediate release tablet 5 mg (has no administration in time range)  ?levofloxacin (LEVAQUIN) IVPB 500 mg (has no administration in time range)  ?morphine (PF) 4 MG/ML injection 6 mg (6 mg Intravenous Given 04/22/21 0935)  ?ondansetron [facility]) injection 4 mg (4 mg Intravenous Given 04/22/21 0936)  ?levofloxacin (LEVAQUIN) IVPB 750 mg (0 mg Intravenous Stopped 04/22/21 1213)  ?lactated ringers bolus 1,000 mL (0 mLs Intravenous Stopped 04/22/21 1438)  ? ? ? ?IMPRESSION / MDM / ASSESSMENT AND PLAN / ED COURSE  ?I reviewed the triage vital signs and the nursing notes. ?Discussed the patient with Dr. Rudene Christians on-call for orthopedics.  Patient likely will be admitted since the husband says he cannot care for at home.  We will put her in a posterior arm splint and sling padded well and see if we can make sure she is otherwise stable.  I have no old labs or records on her even in care everywhere her sodium currently is 130 BUN and creatinine are 34 and 2.1 with a GFR of 24 which could represent AKI.  I am waiting the CK since she was on the floor and will need a second troponin since the first troponin is 23 her white blood count is 30.7 without a source.[id number] ?Waiting for the differential as well. ? ?----------------------------------------- ?10:09 AM on 04/22/2021 ?----------------------------------------- ?CK is negative.  Patient now meets sepsis criteria with a white count and respiratory rate.  She has a UTI.  Lungs do not show pneumonia patient has no other  apparent source of infection.  We will get her in the hospital.  If she cannot be brought back up to her good strength we will have to place her unfortunately. ? ?The patient is on the cardiac monitor to evaluate for evidence of arrhythmia  and/or significant heart rate changes.  None were seen ? ?Possibly some hydration will fix her GFR.  Hopefully.  We will begin this now. ? ?  ? ? ?FINAL CLINICAL IMPRESSION(S) / ED DIAGNOSES  ? ?Final diagnoses:  ?Fall, initial encounter  ?Elbow fracture, right, closed, initial encounter  ?Weakness  ?Sepsis, due to unspecified organism, unspecified whether acute organ dysfunction present [facility])  ?Urinary tract infection without hematuria, site unspecified  ? ? ? ?Rx / DC Orders  ? ?ED Discharge Orders   ? ? None  ? ?  ? ? ? ?Note:  This document was prepared using Dragon voice recognition software and may include unintentional dictation errors. ?  ?Nena Polio, MD ?04/22/21 1603 ? ?

## 2021-04-22 NOTE — Evaluation (Signed)
Occupational Therapy Evaluation ?Patient Details ?Name: Lynn Norton ?MRN: [id number] ?DOB: 1950-11-13 ?Today's Date: 04/22/2021 ? ? ?History of Present Illness 71 y.o. female with history of arthritis who presented from home with complaints of multiple falls over last few days, pain on the right elbow.  Pt with R nondisplaced cpmminuted fx R trochlea.  ? ?Clinical Impression ?  ?Patient presenting with decreased Ind in self care,balance, functional mobility/transfers, endurance, and safety awareness. No family present to confirm baseline.  Patient reports "need help with stuff" when asked about PLOF. Per chart review, pt has been assisting her husband who is currently undergoing cancer treatments. Pt is oriented to self and location only this session. She is unable to tell me why she is here, reports it is October, and the year is 60. OT educated pt on NWB precautions and use of sling. Pt unable to maintain those precautions and needs max A for supine <>sit to EOB. Once EOB, pt begins to push self back and hips moving forwards on EOB. Unsafe to attempt standing as pt is not following simple commands for safety. Pt's husband is unable to provide this level of assist to her at home. Patient will benefit from acute OT to increase overall independence in the areas of ADLs, functional mobility, and safety awareness in order to safely discharge to next venue of care.  ?   ? ?Recommendations for follow up therapy are one component of a multi-disciplinary discharge planning process, led by the attending physician.  Recommendations may be updated based on patient status, additional functional criteria and insurance authorization.  ? ?Follow Up Recommendations ? Skilled nursing-short term rehab (<3 hours/day)  ?  ?Assistance Recommended at Discharge Frequent or constant Supervision/Assistance  ?Patient can return home with the following A lot of help with walking and/or transfers;A lot of help with  bathing/dressing/bathroom;Direct supervision/assist for medications management;Help with stairs or ramp for entrance;Assist for transportation;Direct supervision/assist for financial management;Assistance with cooking/housework ? ?  ?Functional Status Assessment ? Patient has had a recent decline in their functional status and demonstrates the ability to make significant improvements in function in a reasonable and predictable amount of time.  ?Equipment Recommendations ? Other (comment) (defer to next venue of care)  ?  ?   ?Precautions / Restrictions Precautions ?Precautions: Fall ?Required Braces or Orthoses: Sling ?Restrictions ?Weight Bearing Restrictions: Yes ?RUE Weight Bearing: Non weight bearing ?Other Position/Activity Restrictions: sling to be donned at all times  ? ?  ? ?Mobility Bed Mobility ?Overal bed mobility: Needs Assistance ?Bed Mobility: Supine to Sit, Sit to Supine ?  ?  ?Supine to sit: Max assist ?Sit to supine: Total assist, Max assist ?  ?  ?  ? ?Transfers ?  ?  ?  ?  ?  ?  ?  ?  ?  ?General transfer comment: not attempted secondary to safety concerns ?  ? ?  ?Balance Overall balance assessment: Needs assistance ?  ?Sitting balance-Leahy Scale: Poor ?Sitting balance - Comments: Pt pushing trunk backwards and moving forwards on EOB. Not following commands ?Postural control: Posterior lean ?  ?  ?  ?  ?  ?  ?  ?  ?  ?  ?  ?  ?  ?  ?   ? ?ADL either performed or assessed with clinical judgement  ? ?ADL Overall ADL's : Needs assistance/impaired ?  ?  ?  ?  ?  ?  ?  ?  ?  ?  ?  ?  ?  ?  ?  ?  ?  ?  ?  ?  General ADL Comments: max A for self care tasks  ? ? ? ?Vision Patient Visual Report: No change from baseline ?   ?   ?   ?   ? ?Pertinent Vitals/Pain Pain Assessment ?Pain Assessment: Faces ?Faces Pain Scale: Hurts whole lot ?Pain Location: R UE ?Pain Descriptors / Indicators: Discomfort, Aching, Grimacing, Guarding ?Pain Intervention(s): Limited activity within patient's tolerance, Repositioned,  Monitored during session  ? ? ? ?Hand Dominance Right ?  ?Extremity/Trunk Assessment Upper Extremity Assessment ?Upper Extremity Assessment: RUE deficits/detail;Generalized weakness ?RUE Deficits / Details: fx- NWB in sling ?  ?Lower Extremity Assessment ?Lower Extremity Assessment: Generalized weakness ?  ?  ?  ?Communication Communication ?Communication: No difficulties ?  ?Cognition Arousal/Alertness: Awake/alert ?Behavior During Therapy: [facility] for tasks assessed/performed ?Overall Cognitive Status: No family/caregiver present to determine baseline cognitive functioning ?  ?  ?  ?  ?  ?  ?  ?  ?  ?  ?  ?  ?  ?  ?  ?  ?General Comments: Pt is oriented to self and location only. She does not remember staff discussing fx with her. She reports it is Oct 1993 ?  ?  ?   ?   ?   ? ? ?Home Living Family/patient expects to be discharged to:: Private residence ?Living Arrangements: Spouse/significant other ?Available Help at Discharge: Family;Available PRN/intermittently ?Type of Home: House ?Home Access: Stairs to enter ?Entrance Stairs-Number of Steps: " a few" ?  ?Home Layout: One level ?  ?  ?  ?  ?  ?  ?  ?Home Equipment: Kasandra Knudsen - single point ?  ?  ?  ? ?  ?Prior Functioning/Environment Prior Level of Function : Independent/Modified Independent ?  ?  ?  ?  ?  ?  ?  ?  ?  ? ?  ?  ?OT Problem List: Decreased strength;Decreased activity tolerance;Decreased range of motion;Impaired balance (sitting and/or standing);Decreased safety awareness;Pain;Decreased cognition;Cardiopulmonary status limiting activity;Impaired UE functional use;Decreased coordination;Decreased knowledge of use of DME or AE ?  ?   ?OT Treatment/Interventions: Self-care/ADL training;Therapeutic exercise;Patient/family education;Energy conservation;DME and/or AE instruction;Balance training;Therapeutic activities;Cognitive remediation/compensation;Manual therapy  ?  ?OT Goals(Current goals can be found in the care plan section) Acute Rehab OT  Goals ?Patient Stated Goal: to go home ?OT Goal Formulation: With patient ?Time For Goal Achievement: 05/06/21 ?Potential to Achieve Goals: Fair ?ADL Goals ?Pt Will Perform Grooming: with min assist;standing ?Pt Will Perform Upper Body Bathing: with min assist;standing ?Pt Will Perform Upper Body Dressing: with min assist;standing ?Pt Will Perform Lower Body Dressing: with mod assist;sit to/from stand ?Pt Will Transfer to Toilet: with mod assist;stand pivot transfer ?Pt Will Perform Toileting - Clothing Manipulation and hygiene: with mod assist;sit to/from stand  ?OT Frequency: Min 2X/week ?  ? ?   ?AM-PAC OT "6 Clicks" Daily Activity     ?Outcome Measure Help from another person eating meals?: A Little ?Help from another person taking care of personal grooming?: A Lot ?Help from another person toileting, which includes using toliet, bedpan, or urinal?: Total ?Help from another person bathing (including washing, rinsing, drying)?: A Lot ?Help from another person to put on and taking off regular upper body clothing?: A Lot ?Help from another person to put on and taking off regular lower body clothing?: Total ?6 Click Score: 11 ?  ?End of Session Equipment Utilized During Treatment: Other (comment) (sling) ?Nurse Communication: Mobility status ? ?Activity Tolerance: Patient limited by pain ?Patient left: in bed;with call bell/phone  within reach;with nursing/sitter in room ? ?OT Visit Diagnosis: Unsteadiness on feet (R26.81);Repeated falls (R29.6);Muscle weakness (generalized) (M62.81);Pain ?Pain - Right/Left: Right ?Pain - part of body: Arm  ?              ?Time: 1007-1219 ?OT Time Calculation (min): 17 min ?Charges:  OT General Charges ?$OT Visit: 1 Visit ?OT Evaluation ?$OT Eval Moderate Complexity: 1 Mod ?OT Treatments ?$Therapeutic Activity: 8-22 mins ? ?Darleen Crocker, MS, OTR/L , CBIS ?ascom [phone]  ?04/22/21, 3:11 PM  ?

## 2021-04-22 NOTE — Sepsis Progress Note (Signed)
Code Sepsis protocol being monitored by [employer]. 

## 2021-04-22 NOTE — ED Triage Notes (Addendum)
Patient to ER via [facility] from home. Patient reports multiple falls over the last several days. Last night she fell and was unable to get up. Denies LOC. Unknown if hit head. Patient denies taking blood thinners.  ? ?Patient complaining of right shoulder pain that radiates into right elbow. Reports falling onto extremity. ? ?Hx of RA. ? ?EMS VS- HR 104, RR 36, BP 134/68, cbg 188, temp 97.6 ?

## 2021-04-22 NOTE — Consult Note (Signed)
CODE SEPSIS - PHARMACY COMMUNICATION ? ?**Broad Spectrum Antibiotics should be administered within 1 hour of Sepsis diagnosis** ? ?Time Code Sepsis Called/Page Received: 2341 ? ?Antibiotics Ordered: 1015 ? ?Time of 1st antibiotic administration: 4436 ? ? ? ?Darrick Penna ,PharmD ?Clinical Pharmacist  ?04/22/2021  10:16 AM ? ?

## 2021-04-22 NOTE — Assessment & Plan Note (Addendum)
Presented with elevated leukocytes, elevated procalcitonin, AKI.  Source is UTI.   ?-Continue aztreonam ?

## 2021-04-22 NOTE — H&P (Signed)
History and Physical    Lynn Norton TDV:[id number] DOB: 04-08-1950 DOA: 04/22/2021  PCP: [name], MD   Patient coming from: Home    Chief Complaint: Fall  HPI: Lynn Norton is a 71 y.o. female with history of arthritis who presented from home with complaints of multiple falls over last few days, pain on the right elbow.  Patient lives with her husband in a house.  She ambulates with help of cane.  Over the last several weeks, she has been weak, and steady and fallen several times.  Last fall was last night when she could not get up and was very weak.  While going to the bathroom, she fell on her right side and developed pain on her right shoulder and right elbow.  Husband brought her to the emergency department. On presentation she was hemodynamically stable. Patient seen and examined at the bedside in the emergency department this morning.  Husband at the bedside.  She was alert and oriented.  Looks very deconditioned.  She denies any fever, chills, abdominal pain, nausea, vomiting, diarrhea, chest pain, shortness of breath or palpitations.  She complains of some dysuria. Husband stated that he is unable to take care of her at home and wishes for rehabilitation.  ED Course: X-ray of the right elbow showed nondisplaced mildly comminuted fracture of right trochlea.  Orthopedics consulted.  Sling applied.  Lab work showed severe leukocytosis, AKI, elevated procalcitonin.  UA was strongly suggestive of UTI.  Patient was given Levaquin IV.  Patient admitted for the management of sepsis secondary to UTI, right elbow fracture.  Review of Systems: As per HPI otherwise 10 point review of systems negative.    Past Medical History:  Diagnosis Date   Arthritis     History reviewed. No pertinent surgical history.   has no history on file for tobacco use, alcohol use, and drug use.  Allergies  Allergen Reactions   Sulfa Antibiotics    Penicillins Rash   Rocephin [Ceftriaxone]  Rash    No family history on file.   Prior to Admission medications   Not on File    Physical Exam: Vitals:   04/22/21 0900 04/22/21 1018 04/22/21 1019 04/22/21 1030  BP: 124/61 122/67  129/84  Pulse: 88 92  92  Resp: (!) 29 (!) 21  (!) 24  Temp:      TempSrc:      SpO2: 98% (!) 88% 93% 94%  Height:        Constitutional: Very deconditioned, pleasant elderly female Vitals:   04/22/21 0900 04/22/21 1018 04/22/21 1019 04/22/21 1030  BP: 124/61 122/67  129/84  Pulse: 88 92  92  Resp: (!) 29 (!) 21  (!) 24  Temp:      TempSrc:      SpO2: 98% (!) 88% 93% 94%  Height:       Eyes: PERRL, lids and conjunctivae normal ENMT: Mucous membranes are moist.  Neck: normal, supple, no masses, no thyromegaly Respiratory: clear to auscultation bilaterally, no wheezing, no crackles. Normal respiratory effort. No accessory muscle use.  Cardiovascular: Regular rate and rhythm, no murmurs / rubs / gallops. No extremity edema.  Abdomen: no tenderness, no masses palpated. No hepatosplenomegaly. Bowel sounds positive.  Musculoskeletal: no clubbing / cyanosis.  Tenderness on the right elbow, sling on the right upper extremity  skin: no rashes, lesions, ulcers. No induration Neurologic: CN 2-12 grossly intact.  Strength 5/5 in all 4.  Psychiatric: Normal judgment and insight. Alert  and oriented x 3. Normal mood.   Foley Catheter:None  Labs on Admission: I have personally reviewed following labs and imaging studies  CBC: Recent Labs  Lab 04/22/21 0734  WBC 30.7*  NEUTROABS 25.9*  HGB 12.5  HCT 35.6*  MCV 98.3  PLT 962   Basic Metabolic Panel: Recent Labs  Lab 04/22/21 0734  NA 130*  K 4.4  CL 97*  CO2 24  GLUCOSE 129*  BUN 34*  CREATININE 2.10*  CALCIUM 7.3*   GFR: CrCl cannot be calculated (Unknown ideal weight.). Liver Function Tests: Recent Labs  Lab 04/22/21 0734  AST 25  ALT 30  ALKPHOS 102  BILITOT 0.8  PROT 6.1*  ALBUMIN 2.6*   No results for input(s):  LIPASE, AMYLASE in the last 168 hours. No results for input(s): AMMONIA in the last 168 hours. Coagulation Profile: No results for input(s): INR, PROTIME in the last 168 hours. Cardiac Enzymes: Recent Labs  Lab 04/22/21 0734  CKTOTAL 124   BNP (last 3 results) No results for input(s): PROBNP in the last 8760 hours. HbA1C: No results for input(s): HGBA1C in the last 72 hours. CBG: No results for input(s): GLUCAP in the last 168 hours. Lipid Profile: No results for input(s): CHOL, HDL, LDLCALC, TRIG, CHOLHDL, LDLDIRECT in the last 72 hours. Thyroid Function Tests: No results for input(s): TSH, T4TOTAL, FREET4, T3FREE, THYROIDAB in the last 72 hours. Anemia Panel: No results for input(s): VITAMINB12, FOLATE, FERRITIN, TIBC, IRON, RETICCTPCT in the last 72 hours. Urine analysis:    Component Value Date/Time   COLORURINE YELLOW (A) 04/22/2021 0929   APPEARANCEUR CLOUDY (A) 04/22/2021 0929   LABSPEC 1.008 04/22/2021 0929   PHURINE 6.0 04/22/2021 0929   GLUCOSEU NEGATIVE 04/22/2021 0929   HGBUR MODERATE (A) 04/22/2021 0929   BILIRUBINUR NEGATIVE 04/22/2021 0929   KETONESUR NEGATIVE 04/22/2021 0929   PROTEINUR 30 (A) 04/22/2021 0929   NITRITE POSITIVE (A) 04/22/2021 0929   LEUKOCYTESUR LARGE (A) 04/22/2021 0929    Radiological Exams on Admission: DG Shoulder Right  Result Date: 04/22/2021 CLINICAL DATA:  Status post fall.  Right shoulder pain. EXAM: RIGHT SHOULDER - 2+ VIEW COMPARISON:  None. FINDINGS: Post right humeral arthroplasty. No evidence of acute fracture. High riding prostatic humeral head on the glenoid, suggestive of chronic rotator cuff injury. The visualized lung parenchyma is clear. IMPRESSION: 1. No acute fracture or dislocation identified about the right shoulder. 2. Post right humeral arthroplasty. 3. Chronic rotator cuff injury. Electronically Signed   By: Fidela Salisbury M.D.   On: 04/22/2021 08:39   DG Elbow Complete Right  Result Date: 04/22/2021 CLINICAL  DATA:  Status post fall EXAM: RIGHT ELBOW - COMPLETE 3+ VIEW COMPARISON:  None. FINDINGS: Generalized osteopenia. Nondisplaced mildly comminuted fracture of right trochlea. No other fracture or dislocation. No aggressive osseous lesion. Mild osteoarthritis of the right elbow. No soft tissue abnormality. IMPRESSION: 1. Nondisplaced mildly comminuted fracture of right trochlea. Electronically Signed   By: Kathreen Devoid M.D.   On: 04/22/2021 08:37   CT Head Wo Contrast  Result Date: 04/22/2021 CLINICAL DATA:  Multiple falls over the last several days. EXAM: CT HEAD WITHOUT CONTRAST CT CERVICAL SPINE WITHOUT CONTRAST TECHNIQUE: Multidetector CT imaging of the head and cervical spine was performed following the standard protocol without intravenous contrast. Multiplanar CT image reconstructions of the cervical spine were also generated. RADIATION DOSE REDUCTION: This exam was performed according to the departmental dose-optimization program which includes automated exposure control, adjustment of the mA and/or kV  according to patient size and/or use of iterative reconstruction technique. COMPARISON:  None. FINDINGS: Brain: No evidence of acute infarction, hemorrhage, extra-axial collection, ventriculomegaly, or mass effect. Generalized cerebral atrophy. Periventricular white matter low attenuation likely secondary to microangiopathy. Vascular: Cerebrovascular atherosclerotic calcifications are noted. No hyperdense vessels. Skull: Negative for fracture or focal lesion. Sinuses/Orbits: Visualized portions of the orbits are unremarkable. Visualized portions of the paranasal sinuses are unremarkable. Visualized portions of the mastoid air cells are unremarkable. Other: None. CT CERVICAL SPINE FINDINGS Alignment: Normal. Skull base and vertebrae: No acute fracture. No primary bone lesion or focal pathologic process. Soft tissues and spinal canal: No prevertebral fluid or swelling. No visible canal hematoma. Disc levels:  Degenerative disease with disc height loss at C4-5, C5-6, C6-7 and C7-T1. Mild bilateral facet arthropathy at C4-5, C5-6, C6-7 and C7-T1. Upper chest: Lung apices are clear. Other: No fluid collection or hematoma. Bilateral carotid artery atherosclerosis. IMPRESSION: 1. No acute intracranial pathology. 2.  No acute osseous injury of the cervical spine. 3. Cervical spine spondylosis as described above. Electronically Signed   By: Kathreen Devoid M.D.   On: 04/22/2021 09:38   CT Cervical Spine Wo Contrast  Result Date: 04/22/2021 CLINICAL DATA:  Multiple falls over the last several days. EXAM: CT HEAD WITHOUT CONTRAST CT CERVICAL SPINE WITHOUT CONTRAST TECHNIQUE: Multidetector CT imaging of the head and cervical spine was performed following the standard protocol without intravenous contrast. Multiplanar CT image reconstructions of the cervical spine were also generated. RADIATION DOSE REDUCTION: This exam was performed according to the departmental dose-optimization program which includes automated exposure control, adjustment of the mA and/or kV according to patient size and/or use of iterative reconstruction technique. COMPARISON:  None. FINDINGS: Brain: No evidence of acute infarction, hemorrhage, extra-axial collection, ventriculomegaly, or mass effect. Generalized cerebral atrophy. Periventricular white matter low attenuation likely secondary to microangiopathy. Vascular: Cerebrovascular atherosclerotic calcifications are noted. No hyperdense vessels. Skull: Negative for fracture or focal lesion. Sinuses/Orbits: Visualized portions of the orbits are unremarkable. Visualized portions of the paranasal sinuses are unremarkable. Visualized portions of the mastoid air cells are unremarkable. Other: None. CT CERVICAL SPINE FINDINGS Alignment: Normal. Skull base and vertebrae: No acute fracture. No primary bone lesion or focal pathologic process. Soft tissues and spinal canal: No prevertebral fluid or swelling. No  visible canal hematoma. Disc levels: Degenerative disease with disc height loss at C4-5, C5-6, C6-7 and C7-T1. Mild bilateral facet arthropathy at C4-5, C5-6, C6-7 and C7-T1. Upper chest: Lung apices are clear. Other: No fluid collection or hematoma. Bilateral carotid artery atherosclerosis. IMPRESSION: 1. No acute intracranial pathology. 2.  No acute osseous injury of the cervical spine. 3. Cervical spine spondylosis as described above. Electronically Signed   By: Kathreen Devoid M.D.   On: 04/22/2021 09:38   DG Chest Portable 1 View  Result Date: 04/22/2021 CLINICAL DATA:  Multiple falls over the last several days. EXAM: PORTABLE CHEST 1 VIEW COMPARISON:  None. FINDINGS: No focal consolidation. No pleural effusion or pneumothorax. Heart and mediastinal contours are unremarkable. No acute osseous abnormality. Right shoulder arthroplasty. Loss of the normal left acromiohumeral distance as can be seen with a chronic rotator cuff tear. IMPRESSION: No active disease. Electronically Signed   By: Kathreen Devoid M.D.   On: 04/22/2021 08:38     Assessment/Plan Principal Problem:   Sepsis secondary to UTI [facility]) Active Problems:   Closed fracture dislocation of right elbow   AKI (acute kidney injury) ([place])   Hyponatremia   Frequent falls  Assessment and Plan: * Sepsis secondary to UTI [facility]) Presented with elevated leukocytes, elevated procalcitonin, AKI.  Source of sepsis could be UTI.  UA was strongly suggestive of UTI.  Continue current antibiotics.  Follow-up cultures.  Patient complains of dysuria.  Currently afebrile, normotensive  Closed fracture dislocation of right elbow Presented with fall from home.  Complained of right elbow pain on presentation.  X-ray of the right elbow showed nondisplaced mildly comminuted fracture of right trochlea.  Currently in a sling.  Orthopedics consulted and following.  Continue pain management, supportive care.  PT/OT consulted  AKI (acute kidney injury) ([place]) No  history of chronic kidney disease as per the patient.  Recent blood works not on system.  Presented with creatinine in the range of 2.1.  Likely secondary to decreased oral intake, dehydration.  Continue IV fluids.  Monitor BMP.  Patient has been encouraged to drink plenty of fluids.  Hyponatremia This is most likely secondary to hypovolemic hyponatremia.  Continue IV fluids.  Monitor BMP  Frequent falls Following several times at home.  History of arthritis.  Ambulates with the help of cane.  Lives with husband.  PT/OT consulted.        Severity of Illness: The appropriate patient status for this patient is INPATIENT.   DVT prophylaxis: Heparin subcu Code Status: Full code Family Communication: Husband at the bedside Consults called: Orthopedics     Shelly Coss MD [facility]  04/22/2021, 10:47 AM

## 2021-04-22 NOTE — Assessment & Plan Note (Addendum)
Presented with fall from home.  Complained of right elbow pain on presentation.  X-ray of the right elbow showed nondisplaced mildly comminuted fracture of right trochlea.  Currently in a sling.  Orthopedics consulted and following.   ?-ORIF on Monday, 3/6. Follow up after surgery  ?

## 2021-04-23 ENCOUNTER — Encounter: Payer: Self-pay | Admitting: Internal Medicine

## 2021-04-23 ENCOUNTER — Inpatient Hospital Stay: Payer: Medicare Other

## 2021-04-23 DIAGNOSIS — R7881 Bacteremia: Secondary | ICD-10-CM

## 2021-04-23 DIAGNOSIS — B9620 Unspecified Escherichia coli [E. coli] as the cause of diseases classified elsewhere: Secondary | ICD-10-CM | POA: Insufficient documentation

## 2021-04-23 DIAGNOSIS — N390 Urinary tract infection, site not specified: Secondary | ICD-10-CM | POA: Diagnosis not present

## 2021-04-23 DIAGNOSIS — A419 Sepsis, unspecified organism: Secondary | ICD-10-CM | POA: Diagnosis not present

## 2021-04-23 LAB — CBC
HCT: 31.2 % — ABNORMAL LOW (ref 36.0–46.0)
Hemoglobin: 10.9 g/dL — ABNORMAL LOW (ref 12.0–15.0)
MCH: 34.5 pg — ABNORMAL HIGH (ref 26.0–34.0)
MCHC: 34.9 g/dL (ref 30.0–36.0)
MCV: 98.7 fL (ref 80.0–100.0)
Platelets: 296 10*3/uL (ref 150–400)
RBC: 3.16 MIL/uL — ABNORMAL LOW (ref 3.87–5.11)
RDW: 14.2 % (ref 11.5–15.5)
WBC: 25.1 10*3/uL — ABNORMAL HIGH (ref 4.0–10.5)
nRBC: 0 % (ref 0.0–0.2)

## 2021-04-23 LAB — BLOOD CULTURE ID PANEL (REFLEXED) - BCID2
A.calcoaceticus-baumannii: NOT DETECTED
A.calcoaceticus-baumannii: NOT DETECTED
Bacteroides fragilis: NOT DETECTED
Bacteroides fragilis: NOT DETECTED
CTX-M ESBL: NOT DETECTED
CTX-M ESBL: NOT DETECTED
Candida albicans: NOT DETECTED
Candida albicans: NOT DETECTED
Candida auris: NOT DETECTED
Candida auris: NOT DETECTED
Candida glabrata: NOT DETECTED
Candida glabrata: NOT DETECTED
Candida krusei: NOT DETECTED
Candida krusei: NOT DETECTED
Candida parapsilosis: NOT DETECTED
Candida parapsilosis: NOT DETECTED
Candida tropicalis: NOT DETECTED
Candida tropicalis: NOT DETECTED
Carbapenem resist OXA 48 LIKE: NOT DETECTED
Carbapenem resist OXA 48 LIKE: NOT DETECTED
Carbapenem resistance IMP: NOT DETECTED
Carbapenem resistance IMP: NOT DETECTED
Carbapenem resistance KPC: NOT DETECTED
Carbapenem resistance KPC: NOT DETECTED
Carbapenem resistance NDM: NOT DETECTED
Carbapenem resistance NDM: NOT DETECTED
Carbapenem resistance VIM: NOT DETECTED
Carbapenem resistance VIM: NOT DETECTED
Cryptococcus neoformans/gattii: NOT DETECTED
Cryptococcus neoformans/gattii: NOT DETECTED
Enterobacter cloacae complex: NOT DETECTED
Enterobacter cloacae complex: NOT DETECTED
Enterobacterales: DETECTED — AB
Enterobacterales: DETECTED — AB
Enterococcus Faecium: NOT DETECTED
Enterococcus Faecium: NOT DETECTED
Enterococcus faecalis: NOT DETECTED
Enterococcus faecalis: NOT DETECTED
Escherichia coli: DETECTED — AB
Escherichia coli: DETECTED — AB
Haemophilus influenzae: NOT DETECTED
Haemophilus influenzae: NOT DETECTED
Klebsiella aerogenes: NOT DETECTED
Klebsiella aerogenes: NOT DETECTED
Klebsiella oxytoca: NOT DETECTED
Klebsiella oxytoca: NOT DETECTED
Klebsiella pneumoniae: NOT DETECTED
Klebsiella pneumoniae: NOT DETECTED
Listeria monocytogenes: NOT DETECTED
Listeria monocytogenes: NOT DETECTED
Neisseria meningitidis: NOT DETECTED
Neisseria meningitidis: NOT DETECTED
Proteus species: NOT DETECTED
Proteus species: NOT DETECTED
Pseudomonas aeruginosa: NOT DETECTED
Pseudomonas aeruginosa: NOT DETECTED
Salmonella species: NOT DETECTED
Salmonella species: NOT DETECTED
Serratia marcescens: NOT DETECTED
Serratia marcescens: NOT DETECTED
Staphylococcus aureus (BCID): NOT DETECTED
Staphylococcus aureus (BCID): NOT DETECTED
Staphylococcus epidermidis: NOT DETECTED
Staphylococcus epidermidis: NOT DETECTED
Staphylococcus lugdunensis: NOT DETECTED
Staphylococcus lugdunensis: NOT DETECTED
Staphylococcus species: NOT DETECTED
Staphylococcus species: DETECTED — AB
Stenotrophomonas maltophilia: NOT DETECTED
Stenotrophomonas maltophilia: NOT DETECTED
Streptococcus agalactiae: NOT DETECTED
Streptococcus agalactiae: NOT DETECTED
Streptococcus pneumoniae: NOT DETECTED
Streptococcus pneumoniae: NOT DETECTED
Streptococcus pyogenes: NOT DETECTED
Streptococcus pyogenes: NOT DETECTED
Streptococcus species: NOT DETECTED
Streptococcus species: NOT DETECTED

## 2021-04-23 LAB — BASIC METABOLIC PANEL
Anion gap: 10 (ref 5–15)
BUN: 37 mg/dL — ABNORMAL HIGH (ref 8–23)
CO2: 22 mmol/L (ref 22–32)
Calcium: 7.1 mg/dL — ABNORMAL LOW (ref 8.9–10.3)
Chloride: 100 mmol/L (ref 98–111)
Creatinine, Ser: 2.26 mg/dL — ABNORMAL HIGH (ref 0.44–1.00)
GFR, Estimated: 22 mL/min — ABNORMAL LOW (ref >60)
Glucose, Bld: 107 mg/dL — ABNORMAL HIGH (ref 70–99)
Potassium: 4.9 mmol/L (ref 3.5–5.1)
Sodium: 132 mmol/L — ABNORMAL LOW (ref 135–145)

## 2021-04-23 LAB — PROCALCITONIN: Procalcitonin: 2.96 ng/mL

## 2021-04-23 LAB — HEPATITIS B SURFACE ANTIGEN: Hepatitis B Surface Ag: NONREACTIVE

## 2021-04-23 LAB — BRAIN NATRIURETIC PEPTIDE: B Natriuretic Peptide: 130.4 pg/mL — ABNORMAL HIGH (ref 0.0–100.0)

## 2021-04-23 LAB — SODIUM, URINE, RANDOM: Sodium, Ur: 42 mmol/L

## 2021-04-23 MED ORDER — SODIUM CHLORIDE 0.9 % IV SOLN
1.0000 g | Freq: Three times a day (TID) | INTRAVENOUS | Status: DC
  Administered 2021-04-23 – 2021-04-26 (×9): 1 g via INTRAVENOUS
  Filled 2021-04-23 (×11): qty 1

## 2021-04-23 MED ORDER — ADULT MULTIVITAMIN W/MINERALS CH
1.0000 | ORAL_TABLET | Freq: Every day | ORAL | Status: DC
  Administered 2021-04-23 – 2021-04-29 (×7): 1 via ORAL
  Filled 2021-04-23 (×7): qty 1

## 2021-04-23 MED ORDER — LEVOFLOXACIN IN D5W 500 MG/100ML IV SOLN: 500.0000 mg | INTRAVENOUS | Status: DC

## 2021-04-23 MED ORDER — SODIUM CHLORIDE 0.9 % IV SOLN: INTRAVENOUS | Status: DC

## 2021-04-23 MED ORDER — ENSURE ENLIVE PO LIQD
237.0000 mL | Freq: Two times a day (BID) | ORAL | Status: DC
  Administered 2021-04-24 – 2021-04-29 (×8): 237 mL via ORAL

## 2021-04-23 MED ORDER — SODIUM CHLORIDE 0.9 % IV SOLN: INTRAVENOUS | Status: AC

## 2021-04-23 NOTE — Plan of Care (Signed)
?  Problem: Education: ?Goal: Knowledge of General Education information will improve ?Description: Including pain rating scale, medication(s)/side effects and non-pharmacologic comfort measures ?Outcome: Progressing ?  ?Problem: Health Behavior/Discharge Planning: ?Goal: Ability to manage health-related needs will improve ?Outcome: Progressing ?  ?Problem: Clinical Measurements: ?Goal: Ability to maintain clinical measurements within normal limits will improve ?Outcome: Progressing ?Goal: Will remain free from infection ?Outcome: Progressing ?Goal: Diagnostic test results will improve ?Outcome: Progressing ?Goal: Respiratory complications will improve ?Outcome: Progressing ?Goal: Cardiovascular complication will be avoided ?Outcome: Progressing ?  ?Problem: Nutrition: ?Goal: Adequate nutrition will be maintained ?Outcome: Progressing ?  ?Problem: Activity: ?Goal: Risk for activity intolerance will decrease ?Outcome: Progressing ?  ?Problem: Coping: ?Goal: Level of anxiety will decrease ?Outcome: Progressing ?  ?Problem: Elimination: ?Goal: Will not experience complications related to bowel motility ?Outcome: Progressing ?Goal: Will not experience complications related to urinary retention ?Outcome: Progressing ?  ?Problem: Pain Managment: ?Goal: General experience of comfort will improve ?Outcome: Progressing ?  ?Problem: Skin Integrity: ?Goal: Risk for impaired skin integrity will decrease ?Outcome: Progressing ?  ?

## 2021-04-23 NOTE — Assessment & Plan Note (Addendum)
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  Blood cultures noted with E. Coli ?-Treated with aztreonam due to allergy profile initially ?-Discussed with pharmacy on 04/26/2021, will transition to Keflex to complete total of 10-day course ?- Repeat cultures have been negative ?

## 2021-04-23 NOTE — Progress Notes (Signed)
With creatinine being higher and blood cultures positive we will hold off on surgery at present until more medically stable. ?

## 2021-04-23 NOTE — Evaluation (Signed)
Physical Therapy Evaluation Patient Details Name: Lynn Norton MRN: [id number] DOB: [birth year] Today's Date: 04/23/2021  History of Present Illness  71 y.o. female with history of arthritis who presented from home with complaints of multiple falls over last few days, pain on the right elbow.  Pt with R nondisplaced cpmminuted fx R trochlea.  Clinical Impression  Pt received seated in recliner upon arrival to room and pt agreeable to therapy.  Pt still having difficulty with verbalizing precautions of there R UE during initial evaluation.  Pt agreeable to ambulate, and with use of a quad cane, it able to ambulate ~6 feet, however has significant LOB that has to be corrected by therapist to prevent uncontrolled descent to the flor.  Pt also continues to attempt to use the R UE following education on the precautions of her R UE.  Pt navigated to foot of bed and then back to bed, where she was assisted into the supine position.  Pt then requesting that she needed to use the restroom.  Pt assisted back to her feet and performed stand pivot transfer to the [facility] where she was left with call bell in place.  Nurse and NT notified of pt being on [facility] and would be checking in on her.  Current discharge plans to SNF are appropriate at this time due to inability to navigate current precautions or transfers without assistance.  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  Pt will continue to benefit from skilled therapy in order to address deficits listed below.      Recommendations for follow up therapy are one component of a multi-disciplinary discharge planning process, led by the attending physician.  Recommendations may be updated based on patient status, additional functional criteria and insurance authorization.  Follow Up Recommendations Skilled nursing-short term rehab (<3 hours/day)    Assistance Recommended at Discharge Frequent or constant  Supervision/Assistance  Patient can return home with the following  A lot of help with bathing/dressing/bathroom;Assistance with cooking/housework;Help with stairs or ramp for entrance;A lot of help with walking and/or transfers    Equipment Recommendations Other (comment) (Pt reporting that she has a cane at home; TBD by next venue of care.)  Recommendations for Other Services       Functional Status Assessment Patient has had a recent decline in their functional status and demonstrates the ability to make significant improvements in function in a reasonable and predictable amount of time.     Precautions / Restrictions Precautions Precautions: Fall Required Braces or Orthoses: Sling Restrictions Weight Bearing Restrictions: Yes RUE Weight Bearing: Non weight bearing Other Position/Activity Restrictions: sling to be donned at all times      Mobility  Bed Mobility               General bed mobility comments: Pt upright in recliner upon arrival, returned to bed, then asked to utilize [facility] where pt was left with call bell and nurse and nurse tech notified of the pt being on the [facility]    Transfers Overall transfer level: Needs assistance Equipment used: Quad cane Transfers: Sit to/from Stand, Bed to chair/wheelchair/BSC Sit to Stand: Mod assist   Step pivot transfers: Min assist       General transfer comment: Pt with decreased ability to perform transfers safely due to restrictions of the UE.  Pt unable to follow those restrictions at this time.    Ambulation/Gait Ambulation/Gait assistance: Min assist Gait  Distance (Feet): 8 Feet Assistive device: Quad cane Gait Pattern/deviations: Step-to pattern Gait velocity: decreased     General Gait Details: Pt wants to utilize the R UE on the windowsill of the room and reaches for therapist support with the same UE at times as well.  Pt has decreased safety awareness and awareness of precautions.  Stairs             Wheelchair Mobility    Modified Rankin (Stroke Patients Only)       Balance Overall balance assessment: Needs assistance   Sitting balance-Leahy Scale: Poor Sitting balance - Comments: Pt with difficulty following commands. Postural control: Posterior lean Standing balance support: Single extremity supported Standing balance-Leahy Scale: Poor                               Pertinent Vitals/Pain Pain Assessment Pain Assessment: Faces Faces Pain Scale: Hurts whole lot Pain Location: R UE when mobing the shoulder Pain Descriptors / Indicators: Discomfort, Aching, Grimacing, Guarding Pain Intervention(s): Limited activity within patient's tolerance, Monitored during session, Repositioned    Home Living Family/patient expects to be discharged to:: Private residence Living Arrangements: Spouse/significant other Available Help at Discharge: Family;Available PRN/intermittently Type of Home: House Home Access: Stairs to enter   [employer] of Steps: "a few"   Home Layout: One level Home Equipment: Cane - single point      Prior Function Prior Level of Function : Independent/Modified Independent                     Hand Dominance   Dominant Hand: Right    Extremity/Trunk Assessment   Upper Extremity Assessment Upper Extremity Assessment: RUE deficits/detail;Generalized weakness RUE Deficits / Details: fx- NWB in sling    Lower Extremity Assessment Lower Extremity Assessment: Generalized weakness       Communication   Communication: No difficulties  Cognition Arousal/Alertness: Awake/alert Behavior During Therapy: WFL for tasks assessed/performed Overall Cognitive Status: No family/caregiver present to determine baseline cognitive functioning                                 General Comments: Pt confused and has difficulty with memory recall, specifically exercise instructions.        General Comments       Exercises Total Joint Exercises Ankle Circles/Pumps: AROM, Strengthening, Both, 10 reps, Seated Quad Sets: AROM, Strengthening, Both, 10 reps, Seated Gluteal Sets: AROM, Strengthening, Both, 10 reps, Seated Straight Leg Raises: AROM, Strengthening, Both, 10 reps, Seated Long Arc Quad: AROM, Strengthening, Both, 10 reps, Seated Marching in Standing: AROM, Strengthening, Both, 10 reps, Standing Other Exercises Other Exercises: Pt edcuated on role of PT and services provided during hospital stay.   Assessment/Plan    PT Assessment Patient needs continued PT services  PT Problem List Decreased strength;Decreased activity tolerance;Decreased balance;Decreased mobility;Decreased cognition;Decreased knowledge of use of DME;Decreased safety awareness;Decreased knowledge of precautions       PT Treatment Interventions DME instruction;Gait training;Functional mobility training;Therapeutic activities;Therapeutic exercise;Balance training;Neuromuscular re-education    PT Goals (Current goals can be found in the Care Plan section)  Acute Rehab PT Goals Patient Stated Goal: to go home. PT Goal Formulation: With patient Time For Goal Achievement: 05/07/21 Potential to Achieve Goals: Poor    Frequency Min 2X/week     Co-evaluation  AM-PAC PT "6 Clicks" Mobility  Outcome Measure Help needed turning from your back to your side while in a flat bed without using bedrails?: A Lot Help needed moving from lying on your back to sitting on the side of a flat bed without using bedrails?: A Lot Help needed moving to and from a bed to a chair (including a wheelchair)?: A Lot Help needed standing up from a chair using your arms (e.g., wheelchair or bedside chair)?: A Lot Help needed to walk in hospital room?: A Lot Help needed climbing 3-5 steps with a railing? : Total 6 Click Score: 11    End of Session Equipment Utilized During Treatment: Gait belt Activity Tolerance: Patient  limited by pain (in RUE; also limited due to balance deficits.) Patient left: in chair;with call bell/phone within reach;with nursing/sitter in room Nurse Communication: Mobility status (Nurse and NT notified of pt sitting on BSC upon leaving room.) PT Visit Diagnosis: Unsteadiness on feet (R26.81);Other abnormalities of gait and mobility (R26.89);Muscle weakness (generalized) (M62.81);History of falling (Z91.81);Difficulty in walking, not elsewhere classified (R26.2)    Time: 5176-1607 PT Time Calculation (min) (ACUTE ONLY): 35 min   Charges:   PT Evaluation $PT Eval Moderate Complexity: 1 Mod PT Treatments $Gait Training: 8-22 mins $Therapeutic Exercise: 8-22 mins        Gwenlyn Saran, PT, DPT 04/23/21, 2:19 PM   Christie Nottingham 04/23/2021, 2:15 PM

## 2021-04-23 NOTE — Consult Note (Signed)
[facility]  CONSULT NOTE    Date: 04/23/2021                  Patient Name:  Lynn Norton  MRN: [id number]  DOB: [birth year]  Age / Sex: 71 y.o., female         PCP: [name], MD                 Service Requesting Consult: [place]                 Reason for Consult: Acute kidney injury            History of Present Illness: Ms. Lynn Norton is a 71 y.o.  female with past medical conditions including arthritis, hematuria, hypertension, OA, RA, hepatitis, and osteoporosis, who was admitted to [facility] on 04/22/2021 for Weakness [R53.1] Fall, initial encounter [W19.XXXA] Elbow fracture, right, closed, initial encounter [S42.401A] Sepsis ([place]) [A41.9] Urinary tract infection without hematuria, site unspecified [N39.0] Sepsis, due to unspecified organism, unspecified whether acute organ dysfunction present [facility]) [A41.9]  Patient presents to the emergency department with a complaint of frequent falls and right elbow pain.  Patient states she lives at home with her husband and ambulates with a cane.  She states there is no reason for her fall i.e. tripped, slipped, dizziness.  She states she just falls.  Husband currently not at bedside, patient states he receives cancer treatments.  Denies loss of appetite, nausea, vomiting, or diarrhea.  Denies shortness of breath or cough.  Denies chest pain.  Reports right shoulder and arm pain with movement.  Currently sees Dr. Ginette Pitman at [facility].  Denies previous knowledge of kidney damage.  Labs on ED arrival include sodium 130, 129, BUN 34, calcium 7.3, and creatinine 2.1 with GFR 24.  Elevated WBCs 30.7.  Respiratory panel negative for influenza and COVID-19.  Blood cultures pending.  UA has cloudy appearance with moderate hematuria and large leukocytes.  Urine culture pending.  Right elbow x-ray shows mild fracture of right trochlea.  Right shoulder x-ray negative for fracture.  Chest x-ray negative for acute changes.   CT head and cervical spine also negative.  Renal ultrasound negative for obstruction.   Medications: Outpatient medications: Medications Prior to Admission  Medication Sig Dispense Refill Last Dose   albuterol (VENTOLIN HFA) 108 (90 Base) MCG/ACT inhaler Inhale 1-2 puffs into the lungs 4 (four) times daily as needed for wheezing or shortness of breath.   Unknown at PRN   amLODipine (NORVASC) 2.5 MG tablet Take 2.5 mg by mouth daily. (Take with 5mg  tablet to equal 7.5mg  total)   04/21/2021 at Unknown   amLODipine (NORVASC) 5 MG tablet Take 5 mg by mouth daily. (Take with 2.5mg  tablet to equal 7.5mg  total)   1/47/8295 at Unknown   folic acid (FOLVITE) 1 MG tablet Take 1 mg by mouth daily.      glucosamine-chondroitin 500-400 MG tablet Take 1 tablet by mouth 2 (two) times daily.      meloxicam (MOBIC) 15 MG tablet Take 15 mg by mouth daily.   04/21/2021 at Unknown   methotrexate (RHEUMATREX) 2.5 MG tablet Take 15 mg by mouth every Wednesday.   04/15/2021 at Unknown   Multiple Vitamins-Minerals (MULTIVITAMIN WITH MINERALS) tablet Take 1 tablet by mouth daily.      Omega-3 Fatty Acids (FISH OIL) 1200 MG CAPS Take 1 capsule by mouth daily.      omeprazole (PRILOSEC OTC) 20 MG tablet Take  20 mg by mouth daily.   04/21/2021 at Unknown   predniSONE (DELTASONE) 2.5 MG tablet Take 2.5 mg by mouth daily.   04/21/2021 at Unknown   traZODone (DESYREL) 50 MG tablet Take 50 mg by mouth at bedtime.   04/21/2021 at 2100   zolpidem (AMBIEN) 10 MG tablet Take 10 mg by mouth at bedtime as needed for sleep.   Unknown at PRN    Current medications: Current Facility-Administered Medications  Medication Dose Route Frequency Provider Last Rate Last Admin   aztreonam (AZACTAM) 1 g in sodium chloride 0.9 % 100 mL IVPB  1 g Intravenous Q8H Adhikari, Amrit, MD 200 mL/hr at 04/23/21 1308 1 g at 04/23/21 1308   feeding supplement (ENSURE ENLIVE / ENSURE PLUS) liquid 237 mL  237 mL Oral BID BM Adhikari, Amrit, MD       heparin  injection 5,000 Units  5,000 Units Subcutaneous Q8H Adhikari, Amrit, MD   5,000 Units at 04/23/21 1308   morphine (PF) 2 MG/ML injection 2 mg  2 mg Intravenous Q4H PRN Shelly Coss, MD   2 mg at 04/23/21 0606   multivitamin with minerals tablet 1 tablet  1 tablet Oral Daily Shelly Coss, MD   1 tablet at 04/23/21 1308   oxyCODONE (Oxy IR/ROXICODONE) immediate release tablet 5 mg  5 mg Oral Q6H PRN Shelly Coss, MD   5 mg at 04/23/21 1013   polyethylene glycol (MIRALAX / GLYCOLAX) packet 17 g  17 g Oral Daily PRN Shelly Coss, MD          Allergies: Allergies  Allergen Reactions   Rocephin [Ceftriaxone] Hives    Spoke with patient. States she had hives that spread in response to rocephin   Sulfa Antibiotics    Penicillins Itching      Past Medical History: Past Medical History:  Diagnosis Date   Arthritis      Past Surgical History: History reviewed. No pertinent surgical history.   Family History: History reviewed. No pertinent family history.   Social History: Social History   Socioeconomic History   Marital status: Married    Spouse name: Not on file   Number of children: Not on file   Years of education: Not on file   Highest education level: Not on file  Occupational History   Not on file  Tobacco Use   Smoking status: Every Day    Packs/day: 1.00    Years: 50.00    Pack years: 50.00    Types: Cigarettes   Smokeless tobacco: Not on file  Vaping Use   Vaping Use: Never used  Substance and Sexual Activity   Alcohol use: Not on file   Drug use: Not on file   Sexual activity: Not on file  Other Topics Concern   Not on file  Social History Narrative   Not on file   Social Determinants of Health   Financial Resource Strain: Not on file  Food Insecurity: Not on file  Transportation Needs: Not on file  Physical Activity: Not on file  Stress: Not on file  Social Connections: Not on file  Intimate Partner Violence: Not on file     Review  of Systems: Review of Systems  Constitutional:  Negative for chills, fever and malaise/fatigue.  HENT:  Negative for congestion, sore throat and tinnitus.   Eyes:  Negative for blurred vision and redness.  Respiratory:  Negative for cough, shortness of breath and wheezing.   Cardiovascular:  Negative for chest pain, palpitations,  claudication and leg swelling.  Gastrointestinal:  Negative for abdominal pain, blood in stool, diarrhea, nausea and vomiting.  Genitourinary:  Negative for flank pain, frequency and hematuria.  Musculoskeletal:  Positive for falls. Negative for back pain and myalgias.  Skin:  Negative for rash.  Neurological:  Negative for dizziness, weakness and headaches.  Endo/Heme/Allergies:  Does not bruise/bleed easily.  Psychiatric/Behavioral:  Negative for depression. The patient is not nervous/anxious and does not have insomnia.    Vital Signs: Blood pressure (!) 126/56, pulse 98, temperature 98.2 F (36.8 C), resp. rate 18, height 5\' 1"  (1.549 m), weight 46.1 kg, SpO2 91 %.  Weight trends: Filed Weights   04/22/21 1100 04/22/21 1900  Weight: 45.8 kg 46.1 kg    Physical Exam: General: NAD, resting comfortably  Head: Normocephalic, atraumatic. Moist oral mucosal membranes  Eyes: Anicteric  Lungs:  Clear to auscultation, normal effort, room air  Heart: Regular rate and rhythm  Abdomen:  Soft, nontender  Extremities: No peripheral edema.  Right arm splint  Neurologic: Nonfocal, moving all four extremities  Skin: No lesions        Lab results: Basic Metabolic Panel: Recent Labs  Lab 04/22/21 0734 04/23/21 0324  NA 130* 132*  K 4.4 4.9  CL 97* 100  CO2 24 22  GLUCOSE 129* 107*  BUN 34* 37*  CREATININE 2.10* 2.26*  CALCIUM 7.3* 7.1*    Liver Function Tests: Recent Labs  Lab 04/22/21 0734  AST 25  ALT 30  ALKPHOS 102  BILITOT 0.8  PROT 6.1*  ALBUMIN 2.6*   No results for input(s): LIPASE, AMYLASE in the last 168 hours. No results for  input(s): AMMONIA in the last 168 hours.  CBC: Recent Labs  Lab 04/22/21 0734 04/23/21 0324  WBC 30.7* 25.1*  NEUTROABS 25.9*  --   HGB 12.5 10.9*  HCT 35.6* 31.2*  MCV 98.3 98.7  PLT 253 296    Cardiac Enzymes: Recent Labs  Lab 04/22/21 0734  CKTOTAL 124    BNP: Invalid input(s): POCBNP  CBG: No results for input(s): GLUCAP in the last 168 hours.  Microbiology: Results for orders placed or performed during the hospital encounter of 04/22/21  Culture, blood (single)     Status: None (Preliminary result)   Collection Time: 04/22/21 10:18 AM   Specimen: BLOOD  Result Value Ref Range Status   Specimen Description   Final    BLOOD LEFT [facility] Performed at [facility], [address], [place], [place] [address]    Special Requests   Final    BOTTLES DRAWN AEROBIC AND ANAEROBIC Blood Culture adequate volume Performed at [facility], [address] [place], [place] [address]    Culture  Setup Time   Final    GRAM NEGATIVE RODS IN BOTH AEROBIC AND ANAEROBIC BOTTLES CRITICAL RESULT CALLED TO, READ BACK BY AND VERIFIED WITH: JASON ROBBINS @ [phone] ON 04/23/2021.Marland KitchenMarland KitchenTKR GRAM POSITIVE COCCI ANAEROBIC BOTTLE ONLY CRITICAL RESULT CALLED TO, READ BACK BY AND VERIFIED WITH: D,MITCHELL PHARMD@1048  04/23/21 EB Performed at [facility] [address], [place], [place] [address]    Culture Lonell Grandchild NEGATIVE RODS [facility] POSITIVE COCCI   Final   Report Status PENDING  Incomplete  Blood Culture ID Panel (Reflexed)     Status: Abnormal   Collection Time: 04/22/21 10:18 AM  Result Value Ref Range Status   Enterococcus faecalis NOT DETECTED NOT DETECTED Final   Enterococcus Faecium NOT DETECTED NOT DETECTED Final   Listeria monocytogenes NOT  DETECTED NOT DETECTED Final   Staphylococcus species NOT DETECTED NOT DETECTED Final   Staphylococcus aureus (BCID) NOT DETECTED NOT DETECTED Final   Staphylococcus epidermidis NOT DETECTED NOT DETECTED Final   Staphylococcus  lugdunensis NOT DETECTED NOT DETECTED Final   Streptococcus species NOT DETECTED NOT DETECTED Final   Streptococcus agalactiae NOT DETECTED NOT DETECTED Final   Streptococcus pneumoniae NOT DETECTED NOT DETECTED Final   Streptococcus pyogenes NOT DETECTED NOT DETECTED Final   A.calcoaceticus-baumannii NOT DETECTED NOT DETECTED Final   Bacteroides fragilis NOT DETECTED NOT DETECTED Final   Enterobacterales DETECTED (A) NOT DETECTED Final    Comment: Enterobacterales represent a large order of gram negative bacteria, not a single organism. CRITICAL RESULT CALLED TO, READ BACK BY AND VERIFIED WITH: JASON ROBBINS @ [phone] ON 04/23/2021.Marland KitchenMarland KitchenTKR    Enterobacter cloacae complex NOT DETECTED NOT DETECTED Final   Escherichia coli DETECTED (A) NOT DETECTED Final    Comment: CRITICAL RESULT CALLED TO, READ BACK BY AND VERIFIED WITH: JASON ROBBINS @ 4742 ON 04/23/2021.Marland KitchenMarland KitchenTKR    Klebsiella aerogenes NOT DETECTED NOT DETECTED Final   Klebsiella oxytoca NOT DETECTED NOT DETECTED Final   Klebsiella pneumoniae NOT DETECTED NOT DETECTED Final   Proteus species NOT DETECTED NOT DETECTED Final   Salmonella species NOT DETECTED NOT DETECTED Final   Serratia marcescens NOT DETECTED NOT DETECTED Final   Haemophilus influenzae NOT DETECTED NOT DETECTED Final   Neisseria meningitidis NOT DETECTED NOT DETECTED Final   Pseudomonas aeruginosa NOT DETECTED NOT DETECTED Final   Stenotrophomonas maltophilia NOT DETECTED NOT DETECTED Final   Candida albicans NOT DETECTED NOT DETECTED Final   Candida auris NOT DETECTED NOT DETECTED Final   Candida glabrata NOT DETECTED NOT DETECTED Final   Candida krusei NOT DETECTED NOT DETECTED Final   Candida parapsilosis NOT DETECTED NOT DETECTED Final   Candida tropicalis NOT DETECTED NOT DETECTED Final   Cryptococcus neoformans/gattii NOT DETECTED NOT DETECTED Final   CTX-M ESBL NOT DETECTED NOT DETECTED Final   Carbapenem resistance IMP NOT DETECTED NOT DETECTED Final   Carbapenem  resistance KPC NOT DETECTED NOT DETECTED Final   Carbapenem resistance NDM NOT DETECTED NOT DETECTED Final   Carbapenem resist OXA 48 LIKE NOT DETECTED NOT DETECTED Final   Carbapenem resistance VIM NOT DETECTED NOT DETECTED Final    Comment: Performed at [facility], [address] [place], [place] [address]  Blood Culture ID Panel (Reflexed)     Status: Abnormal   Collection Time: 04/22/21 10:18 AM  Result Value Ref Range Status   Enterococcus faecalis NOT DETECTED NOT DETECTED Final   Enterococcus Faecium NOT DETECTED NOT DETECTED Final   Listeria monocytogenes NOT DETECTED NOT DETECTED Final   Staphylococcus species DETECTED (A) NOT DETECTED Final    Comment: CRITICAL RESULT CALLED TO, READ BACK BY AND VERIFIED WITH: D,MITCHELL PHARMD@1048  04/23/21 EB    Staphylococcus aureus (BCID) NOT DETECTED NOT DETECTED Final   Staphylococcus epidermidis NOT DETECTED NOT DETECTED Final   Staphylococcus lugdunensis NOT DETECTED NOT DETECTED Final   Streptococcus species NOT DETECTED NOT DETECTED Final   Streptococcus agalactiae NOT DETECTED NOT DETECTED Final   Streptococcus pneumoniae NOT DETECTED NOT DETECTED Final   Streptococcus pyogenes NOT DETECTED NOT DETECTED Final   A.calcoaceticus-baumannii NOT DETECTED NOT DETECTED Final   Bacteroides fragilis NOT DETECTED NOT DETECTED Final   Enterobacterales DETECTED (A) NOT DETECTED Final    Comment: Enterobacterales represent a large order of gram negative bacteria, not a single organism. CRITICAL RESULT CALLED TO, READ BACK  BY AND VERIFIED WITH: D,MITCHELL PHARMD@1048  04/23/21 EB    Enterobacter cloacae complex NOT DETECTED NOT DETECTED Final   Escherichia coli DETECTED (A) NOT DETECTED Final    Comment: CRITICAL RESULT CALLED TO, READ BACK BY AND VERIFIED WITH: D,MITCHELL PHARMD@1048  04/23/21 EB    Klebsiella aerogenes NOT DETECTED NOT DETECTED Final   Klebsiella oxytoca NOT DETECTED NOT DETECTED Final   Klebsiella pneumoniae NOT  DETECTED NOT DETECTED Final   Proteus species NOT DETECTED NOT DETECTED Final   Salmonella species NOT DETECTED NOT DETECTED Final   Serratia marcescens NOT DETECTED NOT DETECTED Final   Haemophilus influenzae NOT DETECTED NOT DETECTED Final   Neisseria meningitidis NOT DETECTED NOT DETECTED Final   Pseudomonas aeruginosa NOT DETECTED NOT DETECTED Final   Stenotrophomonas maltophilia NOT DETECTED NOT DETECTED Final   Candida albicans NOT DETECTED NOT DETECTED Final   Candida auris NOT DETECTED NOT DETECTED Final   Candida glabrata NOT DETECTED NOT DETECTED Final   Candida krusei NOT DETECTED NOT DETECTED Final   Candida parapsilosis NOT DETECTED NOT DETECTED Final   Candida tropicalis NOT DETECTED NOT DETECTED Final   Cryptococcus neoformans/gattii NOT DETECTED NOT DETECTED Final   CTX-M ESBL NOT DETECTED NOT DETECTED Final   Carbapenem resistance IMP NOT DETECTED NOT DETECTED Final   Carbapenem resistance KPC NOT DETECTED NOT DETECTED Final   Carbapenem resistance NDM NOT DETECTED NOT DETECTED Final   Carbapenem resist OXA 48 LIKE NOT DETECTED NOT DETECTED Final   Carbapenem resistance VIM NOT DETECTED NOT DETECTED Final    Comment: Performed at [facility], [address], [place], [place] [address]  Resp Panel by RT-PCR (Flu A&B, Covid) Nasopharyngeal Swab     Status: None   Collection Time: 04/22/21  5:07 PM   Specimen: Nasopharyngeal Swab; Nasopharyngeal(NP) swabs in vial transport medium  Result Value Ref Range Status   SARS Coronavirus 2 by RT PCR NEGATIVE NEGATIVE Final    Comment: (NOTE) SARS-CoV-2 target nucleic acids are NOT DETECTED.  The SARS-CoV-2 RNA is generally detectable in upper respiratory specimens during the acute phase of infection. The lowest concentration of SARS-CoV-2 viral copies this assay can detect is 138 copies/mL. A negative result does not preclude SARS-Cov-2 infection and should not be used as the sole basis for treatment or other patient  management decisions. A negative result may occur with  improper specimen collection/handling, submission of specimen other than nasopharyngeal swab, presence of viral mutation(s) within the areas targeted by this assay, and inadequate number of viral copies(<138 copies/mL). A negative result must be combined with clinical observations, patient history, and epidemiological information. The expected result is Negative.  Fact Sheet for Patients:  EntrepreneurPulse.com.au  Fact Sheet for Healthcare Providers:  IncredibleEmployment.be  This test is no t yet approved or cleared by the [place] FDA and  has been authorized for detection and/or diagnosis of SARS-CoV-2 by FDA under an Emergency Use Authorization (EUA). This EUA will remain  in effect (meaning this test can be used) for the duration of the COVID-19 declaration under Section 564(b)(1) of the Act, 21 U.S.C.section 360bbb-3(b)(1), unless the authorization is terminated  or revoked sooner.       Influenza A by PCR NEGATIVE NEGATIVE Final   Influenza B by PCR NEGATIVE NEGATIVE Final    Comment: (NOTE) The Xpert Xpress SARS-CoV-2/FLU/RSV plus assay is intended as an aid in the diagnosis of influenza from Nasopharyngeal swab specimens and should not be used as a sole basis for treatment. Nasal washings  and aspirates are unacceptable for Xpert Xpress SARS-CoV-2/FLU/RSV testing.  Fact Sheet for Patients: EntrepreneurPulse.com.au  Fact Sheet for Healthcare Providers: IncredibleEmployment.be  This test is not yet approved or cleared by the [place] FDA and has been authorized for detection and/or diagnosis of SARS-CoV-2 by FDA under an Emergency Use Authorization (EUA). This EUA will remain in effect (meaning this test can be used) for the duration of the COVID-19 declaration under Section 564(b)(1) of the Act, 21 U.S.C. section 360bbb-3(b)(1),  unless the authorization is terminated or revoked.  Performed at [facility], [address], [place], [place] [address]   Surgical PCR screen     Status: None   Collection Time: 04/22/21  5:10 PM   Specimen: Nasal Mucosa; Nasal Swab  Result Value Ref Range Status   MRSA, PCR NEGATIVE NEGATIVE Final   Staphylococcus aureus NEGATIVE NEGATIVE Final    Comment: (NOTE) The Xpert SA Assay (FDA approved for NASAL specimens in patients 97 years of age and older), is one component of a comprehensive surveillance program. It is not intended to diagnose infection nor to guide or monitor treatment. Performed at [facility], [address] [place], [place] [address]   MRSA Next Gen by PCR, Nasal     Status: None   Collection Time: 04/22/21  6:42 PM   Specimen: Nasal Mucosa; Nasal Swab  Result Value Ref Range Status   MRSA by PCR Next Gen NOT DETECTED NOT DETECTED Final    Comment: (NOTE) The GeneXpert MRSA Assay (FDA approved for NASAL specimens only), is one component of a comprehensive MRSA colonization surveillance program. It is not intended to diagnose MRSA infection nor to guide or monitor treatment for MRSA infections. Test performance is not FDA approved in patients less than 46 years old. Performed at [facility], [address] [place], [place] [address]   CULTURE, BLOOD (ROUTINE X 2) w Reflex to ID Panel     Status: None (Preliminary result)   Collection Time: 04/22/21  6:48 PM   Specimen: BLOOD  Result Value Ref Range Status   Specimen Description BLOOD BLOOD RIGHT HAND  Final   Special Requests   Final    BOTTLES DRAWN AEROBIC AND ANAEROBIC Blood Culture adequate volume   Culture   Final    NO GROWTH < 24 HOURS Performed at [facility], [address] [place], [place] [address]    Report Status PENDING  Incomplete  CULTURE, BLOOD (ROUTINE X 2) w Reflex to ID Panel     Status: None (Preliminary result)   Collection Time:  04/22/21  7:03 PM   Specimen: BLOOD  Result Value Ref Range Status   Specimen Description BLOOD BLOOD LEFT HAND  Final   Special Requests   Final    BOTTLES DRAWN AEROBIC AND ANAEROBIC Blood Culture adequate volume   Culture   Final    NO GROWTH < 24 HOURS Performed at [facility], [address] [place], [place] [address]    Report Status PENDING  Incomplete    Coagulation Studies: No results for input(s): LABPROT, INR in the last 72 hours.  Urinalysis: Recent Labs    04/22/21 0929  COLORURINE YELLOW*  LABSPEC 1.008  PHURINE 6.0  GLUCOSEU NEGATIVE  HGBUR MODERATE*  BILIRUBINUR NEGATIVE  KETONESUR NEGATIVE  PROTEINUR 30*  NITRITE POSITIVE*  LEUKOCYTESUR LARGE*      Imaging: DG Shoulder Right  Result Date: 04/22/2021 CLINICAL DATA:  Status post fall.  Right shoulder pain. EXAM:  RIGHT SHOULDER - 2+ VIEW COMPARISON:  None. FINDINGS: Post right humeral arthroplasty. No evidence of acute fracture. High riding prostatic humeral head on the glenoid, suggestive of chronic rotator cuff injury. The visualized lung parenchyma is clear. IMPRESSION: 1. No acute fracture or dislocation identified about the right shoulder. 2. Post right humeral arthroplasty. 3. Chronic rotator cuff injury. Electronically Signed   By: Fidela Salisbury M.D.   On: 04/22/2021 08:39   DG Elbow Complete Right  Result Date: 04/22/2021 CLINICAL DATA:  Status post fall EXAM: RIGHT ELBOW - COMPLETE 3+ VIEW COMPARISON:  None. FINDINGS: Generalized osteopenia. Nondisplaced mildly comminuted fracture of right trochlea. No other fracture or dislocation. No aggressive osseous lesion. Mild osteoarthritis of the right elbow. No soft tissue abnormality. IMPRESSION: 1. Nondisplaced mildly comminuted fracture of right trochlea. Electronically Signed   By: Kathreen Devoid M.D.   On: 04/22/2021 08:37   CT Head Wo Contrast  Result Date: 04/22/2021 CLINICAL DATA:  Multiple falls over the last several days. EXAM: CT  HEAD WITHOUT CONTRAST CT CERVICAL SPINE WITHOUT CONTRAST TECHNIQUE: Multidetector CT imaging of the head and cervical spine was performed following the standard protocol without intravenous contrast. Multiplanar CT image reconstructions of the cervical spine were also generated. RADIATION DOSE REDUCTION: This exam was performed according to the departmental dose-optimization program which includes automated exposure control, adjustment of the mA and/or kV according to patient size and/or use of iterative reconstruction technique. COMPARISON:  None. FINDINGS: Brain: No evidence of acute infarction, hemorrhage, extra-axial collection, ventriculomegaly, or mass effect. Generalized cerebral atrophy. Periventricular white matter low attenuation likely secondary to microangiopathy. Vascular: Cerebrovascular atherosclerotic calcifications are noted. No hyperdense vessels. Skull: Negative for fracture or focal lesion. Sinuses/Orbits: Visualized portions of the orbits are unremarkable. Visualized portions of the paranasal sinuses are unremarkable. Visualized portions of the mastoid air cells are unremarkable. Other: None. CT CERVICAL SPINE FINDINGS Alignment: Normal. Skull base and vertebrae: No acute fracture. No primary bone lesion or focal pathologic process. Soft tissues and spinal canal: No prevertebral fluid or swelling. No visible canal hematoma. Disc levels: Degenerative disease with disc height loss at C4-5, C5-6, C6-7 and C7-T1. Mild bilateral facet arthropathy at C4-5, C5-6, C6-7 and C7-T1. Upper chest: Lung apices are clear. Other: No fluid collection or hematoma. Bilateral carotid artery atherosclerosis. IMPRESSION: 1. No acute intracranial pathology. 2.  No acute osseous injury of the cervical spine. 3. Cervical spine spondylosis as described above. Electronically Signed   By: Kathreen Devoid M.D.   On: 04/22/2021 09:38   CT Cervical Spine Wo Contrast  Result Date: 04/22/2021 CLINICAL DATA:  Multiple falls over  the last several days. EXAM: CT HEAD WITHOUT CONTRAST CT CERVICAL SPINE WITHOUT CONTRAST TECHNIQUE: Multidetector CT imaging of the head and cervical spine was performed following the standard protocol without intravenous contrast. Multiplanar CT image reconstructions of the cervical spine were also generated. RADIATION DOSE REDUCTION: This exam was performed according to the departmental dose-optimization program which includes automated exposure control, adjustment of the mA and/or kV according to patient size and/or use of iterative reconstruction technique. COMPARISON:  None. FINDINGS: Brain: No evidence of acute infarction, hemorrhage, extra-axial collection, ventriculomegaly, or mass effect. Generalized cerebral atrophy. Periventricular white matter low attenuation likely secondary to microangiopathy. Vascular: Cerebrovascular atherosclerotic calcifications are noted. No hyperdense vessels. Skull: Negative for fracture or focal lesion. Sinuses/Orbits: Visualized portions of the orbits are unremarkable. Visualized portions of the paranasal sinuses are unremarkable. Visualized portions of the mastoid air cells are unremarkable. Other: None. CT  CERVICAL SPINE FINDINGS Alignment: Normal. Skull base and vertebrae: No acute fracture. No primary bone lesion or focal pathologic process. Soft tissues and spinal canal: No prevertebral fluid or swelling. No visible canal hematoma. Disc levels: Degenerative disease with disc height loss at C4-5, C5-6, C6-7 and C7-T1. Mild bilateral facet arthropathy at C4-5, C5-6, C6-7 and C7-T1. Upper chest: Lung apices are clear. Other: No fluid collection or hematoma. Bilateral carotid artery atherosclerosis. IMPRESSION: 1. No acute intracranial pathology. 2.  No acute osseous injury of the cervical spine. 3. Cervical spine spondylosis as described above. Electronically Signed   By: Kathreen Devoid M.D.   On: 04/22/2021 09:38   US RENAL  Result Date: 04/23/2021 CLINICAL DATA:  Acute  kidney injury EXAM: RENAL / URINARY TRACT ULTRASOUND COMPLETE COMPARISON:  None. FINDINGS: Right Kidney: Renal measurements: 11.8 x 5.9 x 4.3 cm = volume: 155 mL. Echogenicity within normal limits. No mass or hydronephrosis visualized. Right extrarenal pelvis. Left Kidney: Renal measurements: 10.6 x 6 x 5.5 cm = volume: 185 mL. Echogenicity within normal limits. No mass or hydronephrosis visualized. Bladder: No bladder wall thickening. Debris dependently along the bladder base. Other: None. IMPRESSION: 1. Normal renal ultrasound. Electronically Signed   By: Kathreen Devoid M.D.   On: 04/23/2021 10:07   DG Chest Portable 1 View  Result Date: 04/22/2021 CLINICAL DATA:  Multiple falls over the last several days. EXAM: PORTABLE CHEST 1 VIEW COMPARISON:  None. FINDINGS: No focal consolidation. No pleural effusion or pneumothorax. Heart and mediastinal contours are unremarkable. No acute osseous abnormality. Right shoulder arthroplasty. Loss of the normal left acromiohumeral distance as can be seen with a chronic rotator cuff tear. IMPRESSION: No active disease. Electronically Signed   By: Kathreen Devoid M.D.   On: 04/22/2021 08:38     Assessment & Plan: Ms. Joesphine Schemm is a 71 y.o.  female with past medical conditions including arthritis, hematuria, hypertension, OA, RA, hepatitis, and osteoporosis, who was admitted to [facility] on 04/22/2021 for Weakness [R53.1] Fall, initial encounter [W19.XXXA] Elbow fracture, right, closed, initial encounter [S42.401A] Sepsis ([place]) [A41.9] Urinary tract infection without hematuria, site unspecified [N39.0] Sepsis, due to unspecified organism, unspecified whether acute organ dysfunction present ([place]) [A41.9]  Acute kidney injury likely secondary to progression of urinary tract infection.  Normal renal function July 2022.  Renal ultrasound negative for obstruction.  No IV contrast exposure.  We will treat with IV hydration, 1 L normal saline at 200 mL/h.  Encourage oral intake.   We will continue to monitor renal function.   2.  Hypertension .  Home medications include amlodipine.  Currently held  3.  Sepsis secondary to UTI.  UA suggestive of UTI, currently awaiting urine culture.  Patient currently on a aztreonam for bacteremia.  Follow-up cultures ordered   LOS: 1 [place] 3/2/20234:56 PM

## 2021-04-23 NOTE — Progress Notes (Signed)
PHARMACY - PHYSICIAN COMMUNICATION ?CRITICAL VALUE ALERT - BLOOD CULTURE IDENTIFICATION (BCID) ? ?Lynn Norton is an 71 y.o. female who presented to [facility] on 04/22/2021 with a chief complaint of sepsis, UTI ? ?Assessment:  E coli in 2 of 6 bottles, no resistance  (include suspected source if known) ? ?Name of physician (or Provider) Contacted: Rachael Fee, NP  ? ?Current antibiotics: Levaquin 500 mg IV Q48H  ? ?Changes to prescribed antibiotics recommended:  ?NP would like to switch to ceftriaxone; pt has allergy to ceftriaxone (hives) so will continue levaquin.  ? ?Results for orders placed or performed during the hospital encounter of 04/22/21  ?Blood Culture ID Panel (Reflexed) (Collected: 04/22/2021 10:18 AM)  ?Result Value Ref Range  ? Enterococcus faecalis NOT DETECTED NOT DETECTED  ? Enterococcus Faecium NOT DETECTED NOT DETECTED  ? Listeria monocytogenes NOT DETECTED NOT DETECTED  ? Staphylococcus species NOT DETECTED NOT DETECTED  ? Staphylococcus aureus (BCID) NOT DETECTED NOT DETECTED  ? Staphylococcus epidermidis NOT DETECTED NOT DETECTED  ? Staphylococcus lugdunensis NOT DETECTED NOT DETECTED  ? Streptococcus species NOT DETECTED NOT DETECTED  ? Streptococcus agalactiae NOT DETECTED NOT DETECTED  ? Streptococcus pneumoniae NOT DETECTED NOT DETECTED  ? Streptococcus pyogenes NOT DETECTED NOT DETECTED  ? A.calcoaceticus-baumannii NOT DETECTED NOT DETECTED  ? Bacteroides fragilis NOT DETECTED NOT DETECTED  ? Enterobacterales DETECTED (A) NOT DETECTED  ? Enterobacter cloacae complex NOT DETECTED NOT DETECTED  ? Escherichia coli DETECTED (A) NOT DETECTED  ? Klebsiella aerogenes NOT DETECTED NOT DETECTED  ? Klebsiella oxytoca NOT DETECTED NOT DETECTED  ? Klebsiella pneumoniae NOT DETECTED NOT DETECTED  ? Proteus species NOT DETECTED NOT DETECTED  ? Salmonella species NOT DETECTED NOT DETECTED  ? Serratia marcescens NOT DETECTED NOT DETECTED  ? Haemophilus influenzae NOT DETECTED NOT DETECTED  ? Neisseria  meningitidis NOT DETECTED NOT DETECTED  ? Pseudomonas aeruginosa NOT DETECTED NOT DETECTED  ? Stenotrophomonas maltophilia NOT DETECTED NOT DETECTED  ? Candida albicans NOT DETECTED NOT DETECTED  ? Candida auris NOT DETECTED NOT DETECTED  ? Candida glabrata NOT DETECTED NOT DETECTED  ? Candida krusei NOT DETECTED NOT DETECTED  ? Candida parapsilosis NOT DETECTED NOT DETECTED  ? Candida tropicalis NOT DETECTED NOT DETECTED  ? Cryptococcus neoformans/gattii NOT DETECTED NOT DETECTED  ? CTX-M ESBL NOT DETECTED NOT DETECTED  ? Carbapenem resistance IMP NOT DETECTED NOT DETECTED  ? Carbapenem resistance KPC NOT DETECTED NOT DETECTED  ? Carbapenem resistance NDM NOT DETECTED NOT DETECTED  ? Carbapenem resist OXA 48 LIKE NOT DETECTED NOT DETECTED  ? Carbapenem resistance VIM NOT DETECTED NOT DETECTED  ? ? ?[name] D ?04/23/2021  1:36 AM ? ?

## 2021-04-23 NOTE — Progress Notes (Signed)
PHARMACY - PHYSICIAN COMMUNICATION ?CRITICAL VALUE ALERT - BLOOD CULTURE IDENTIFICATION (BCID) ? ?Lynn Norton is an 71 y.o. female who presented to [facility] on 04/22/2021 with a chief complaint of sepsis, UTI ? ?Assessment:  E coli in 2 of 6 bottles, no resistance.  On 3/2 lab called with [facility] in 1 of 6 bottles, BCID detected Staphylococcus species.  Not S. Aureus or epidermidis ? ?Name of physician (or Provider) Contacted: Dr Tawanna Solo ? ?Current antibiotics: aztreonam 1gm IV q8h  ? ?Changes to prescribed antibiotics recommended:  ?- continue aztreonam for E COli bacteremia ?- Staphylococcus species likely contaminant ? ?Results for orders placed or performed during the hospital encounter of 04/22/21  ?Blood Culture ID Panel (Reflexed) (Collected: 04/22/2021 10:18 AM)  ?Result Value Ref Range  ? Enterococcus faecalis NOT DETECTED NOT DETECTED  ? Enterococcus Faecium NOT DETECTED NOT DETECTED  ? Listeria monocytogenes NOT DETECTED NOT DETECTED  ? Staphylococcus species DETECTED (A) NOT DETECTED  ? Staphylococcus aureus (BCID) NOT DETECTED NOT DETECTED  ? Staphylococcus epidermidis NOT DETECTED NOT DETECTED  ? Staphylococcus lugdunensis NOT DETECTED NOT DETECTED  ? Streptococcus species NOT DETECTED NOT DETECTED  ? Streptococcus agalactiae NOT DETECTED NOT DETECTED  ? Streptococcus pneumoniae NOT DETECTED NOT DETECTED  ? Streptococcus pyogenes NOT DETECTED NOT DETECTED  ? A.calcoaceticus-baumannii NOT DETECTED NOT DETECTED  ? Bacteroides fragilis NOT DETECTED NOT DETECTED  ? Enterobacterales DETECTED (A) NOT DETECTED  ? Enterobacter cloacae complex NOT DETECTED NOT DETECTED  ? Escherichia coli DETECTED (A) NOT DETECTED  ? Klebsiella aerogenes NOT DETECTED NOT DETECTED  ? Klebsiella oxytoca NOT DETECTED NOT DETECTED  ? Klebsiella pneumoniae NOT DETECTED NOT DETECTED  ? Proteus species NOT DETECTED NOT DETECTED  ? Salmonella species NOT DETECTED NOT DETECTED  ? Serratia marcescens NOT DETECTED NOT DETECTED  ?  Haemophilus influenzae NOT DETECTED NOT DETECTED  ? Neisseria meningitidis NOT DETECTED NOT DETECTED  ? Pseudomonas aeruginosa NOT DETECTED NOT DETECTED  ? Stenotrophomonas maltophilia NOT DETECTED NOT DETECTED  ? Candida albicans NOT DETECTED NOT DETECTED  ? Candida auris NOT DETECTED NOT DETECTED  ? Candida glabrata NOT DETECTED NOT DETECTED  ? Candida krusei NOT DETECTED NOT DETECTED  ? Candida parapsilosis NOT DETECTED NOT DETECTED  ? Candida tropicalis NOT DETECTED NOT DETECTED  ? Cryptococcus neoformans/gattii NOT DETECTED NOT DETECTED  ? CTX-M ESBL NOT DETECTED NOT DETECTED  ? Carbapenem resistance IMP NOT DETECTED NOT DETECTED  ? Carbapenem resistance KPC NOT DETECTED NOT DETECTED  ? Carbapenem resistance NDM NOT DETECTED NOT DETECTED  ? Carbapenem resist OXA 48 LIKE NOT DETECTED NOT DETECTED  ? Carbapenem resistance VIM NOT DETECTED NOT DETECTED  ? ? ?Doreene Eland, PharmD, BCPS, BCIDP ?Work Cell: [phone] ?04/23/2021 11:45 AM ? ? ? ?

## 2021-04-23 NOTE — TOC Progression Note (Signed)
Transition of Care (TOC) - Progression Note  ? ? ?Patient Details  ?Name: Lynn Norton ?MRN: [id number] ?Date of Birth: 05-28-1950 ? ?Transition of Care (TOC) CM/SW Contact  ?Conception Oms, RN ?Phone Number: ?04/23/2021, 10:29 AM ? ?Clinical Narrative:   TOC to continue to follow, the surgery is postponed until stable,  ? ?May need to go to [facility] ? ?  ?  ? ?Expected Discharge Plan and Services ?  ?  ?  ?  ?  ?                ?  ?  ?  ?  ?  ?  ?  ?  ?  ?  ? ? ?Social Determinants of Health (SDOH) Interventions ?  ? ?Readmission Risk Interventions ?No flowsheet data found. ? ?

## 2021-04-23 NOTE — Progress Notes (Signed)
Initial Nutrition Assessment ? ?DOCUMENTATION CODES:  ? ?Not applicable ? ?INTERVENTION:  ? ?-MVI with minerals daily ?-Ensure Enlive po BID, each supplement provides 350 kcal and 20 grams of protein.  ? ?NUTRITION DIAGNOSIS:  ? ?Increased nutrient needs related to post-op healing as evidenced by estimated needs. ? ?GOAL:  ? ?Patient will meet greater than or equal to 90% of their needs ? ?MONITOR:  ? ?PO intake, Supplement acceptance, Labs, Weight trends, Skin, I & O's ? ?REASON FOR ASSESSMENT:  ? ?Malnutrition Screening Tool ?  ? ?ASSESSMENT:  ? ?Lynn Norton is a 71 y.o. female with history of arthritis who presented from home with complaints of multiple falls over last few days, pain on the right elbow.  Patient lives with her husband in a house.  She ambulates with help of cane.  Over the last several weeks, she has been weak, and steady and fallen several times.  Last fall was last night when she could not get up and was very weak.  While going to the bathroom, she fell on her right side and developed pain on her right shoulder and right elbow.  Husband brought her to the emergency department. ? ?Pt admitted with sepsis, UTI, and rt elbow fracture.  ? ?Reviewed I/O's: +1.9 L x 24 hours  ? ?UOP: 700 ml x 24 hours ?  ?Per orthopedics notes, plan to hold off on surgery until pt is more medically stable.  ? ?Pt working with physical therapy at time of visit. RD unable to obtain further nutrition-related history or complete nutrition-focused physical exam at this time.   ? ?No wt hx available to assess changes at this time.  ? ?Medications reviewed.  ? ?Labs reviewed: Na: 132.  ? ?Diet Order:   ?Diet Order   ? ?       ?  Diet regular Room service appropriate? Yes; Fluid consistency: Thin  Diet effective now       ?  ? ?  ?  ? ?  ? ? ?EDUCATION NEEDS:  ? ?No education needs have been identified at this time ? ?Skin:  Skin Assessment: Reviewed RN Assessment ? ?Last BM:  Unknown ? ?Height:  ? ?Ht Readings from Last  1 Encounters:  ?04/22/21 5\' 1"  (1.549 m)  ? ? ?Weight:  ? ?Wt Readings from Last 1 Encounters:  ?04/22/21 46.1 kg  ? ? ?Ideal Body Weight:  47.7 kg ? ?BMI:  Body mass index is 19.2 kg/m?. ? ?Estimated Nutritional Needs:  ? ?Kcal:  1450-1650 ? ?Protein:  70-85 grams ? ?Fluid:  > 1.4 L ? ? ? ?Loistine Chance, RD, LDN, CDCES ?Registered Dietitian II ?Certified Diabetes Care and Education Specialist ?Please refer to [facility] for RD and/or RD on-call/weekend/after hours pager  ?

## 2021-04-23 NOTE — Progress Notes (Signed)
Bladder scanned patient at 6:30 am due to her not voiding throughout the night. Patient is a side sleeper and stated she could not sleep on her back and that maybe is the reason why she can't void. The bladder scan resulted in 960 ml of urine. Placed patient on bedside commode and patient was able to void 700 ml of dark amber colored urine after sitting for about 20 minutes.  ?

## 2021-04-23 NOTE — Progress Notes (Signed)
Occupational Therapy Treatment ?Patient Details ?Name: Lynn Norton ?MRN: [id number] ?DOB: 1950/04/23 ?Today's Date: 04/23/2021 ? ? ?History of present illness 71 y.o. female with history of arthritis who presented from home with complaints of multiple falls over last few days, pain on the right elbow.  Pt with R nondisplaced comminuted fx R trochlea. ?  ?OT comments ? Upon entering the room, pt supine in bed and agreeable to OT intervention. Pt noted to have increased edema to R hand. OT performing retrograde massage and pt tolerating with some success. Pt reports need to use bathroom. Pt needing max A for bed mobility with cuing and increased assist to maintain R UE precaution. Mod A to stand and stand pivot to 3 in 1 BSC. Pt able to void and perform hygiene but needing max A for clothing management and balance. Pt returning to bed and repositioned for comfort with R hand elevated. All needs within reach and bed alarm activated for safety.   ? ?Recommendations for follow up therapy are one component of a multi-disciplinary discharge planning process, led by the attending physician.  Recommendations may be updated based on patient status, additional functional criteria and insurance authorization. ?   ?Follow Up Recommendations ? Skilled nursing-short term rehab (<3 hours/day)  ?  ?Assistance Recommended at Discharge Frequent or constant Supervision/Assistance  ?Patient can return home with the following ? A lot of help with walking and/or transfers;A lot of help with bathing/dressing/bathroom;Direct supervision/assist for medications management;Help with stairs or ramp for entrance;Assist for transportation;Direct supervision/assist for financial management;Assistance with cooking/housework ?  ?Equipment Recommendations ? Other (comment) (defer to next venue of care)  ?  ?   ?Precautions / Restrictions Precautions ?Precautions: Fall ?Required Braces or Orthoses: Sling ?Restrictions ?Weight Bearing Restrictions:  Yes ?RUE Weight Bearing: Non weight bearing ?Other Position/Activity Restrictions: sling to be donned at all times  ? ? ?  ? ?Mobility Bed Mobility ?Overal bed mobility: Needs Assistance ?Bed Mobility: Supine to Sit, Sit to Supine ?  ?  ?Supine to sit: Max assist ?Sit to supine: Max assist ?  ?General bed mobility comments: for trunk support and B LEs to EOB ?  ? ?Transfers ?Overall transfer level: Needs assistance ?Equipment used: 1 person hand held assist ?Transfers: Sit to/from Stand, Bed to chair/wheelchair/BSC ?Sit to Stand: Mod assist ?  ?  ?Step pivot transfers: Mod assist ?  ?  ?  ?  ?  ?Balance Overall balance assessment: Needs assistance ?  ?Sitting balance-Leahy Scale: Poor ?Sitting balance - Comments: Pt with difficulty following commands. ?Postural control: Posterior lean ?Standing balance support: Single extremity supported ?Standing balance-Leahy Scale: Poor ?  ?  ?  ?  ?  ?  ?  ?  ?  ?  ?  ?  ?   ? ?ADL either performed or assessed with clinical judgement  ? ?ADL Overall ADL's : Needs assistance/impaired ?  ?  ?  ?  ?  ?  ?  ?  ?  ?  ?  ?  ?Toilet Transfer: Moderate assistance;BSC/3in1;Stand-pivot ?  ?Toileting- Clothing Manipulation and Hygiene: Maximal assistance;Sit to/from stand ?  ?  ?  ?  ?  ?  ? ?Extremity/Trunk Assessment Upper Extremity Assessment ?Upper Extremity Assessment: RUE deficits/detail ?RUE Deficits / Details: fx- NWB in sling ?  ?Lower Extremity Assessment ?Lower Extremity Assessment: Generalized weakness ?  ?  ?  ? ?Vision Patient Visual Report: No change from baseline ?  ?  ?   ?   ? ?  Cognition Arousal/Alertness: Awake/alert ?Behavior During Therapy: [facility] for tasks assessed/performed ?Overall Cognitive Status: No family/caregiver present to determine baseline cognitive functioning ?  ?  ?  ?  ?  ?  ?  ?  ?  ?  ?  ?  ?  ?  ?  ?  ?General Comments: Pt confused and has difficulty with memory recall and needing cuing/ manual assist to maintain R UE precautions ?  ?  ?   ?   ?   ?    ? ? ?Pertinent Vitals/ Pain       Pain Assessment ?Pain Assessment: Faces ?Faces Pain Scale: Hurts whole lot ?Pain Location: R UE ?Pain Descriptors / Indicators: Discomfort, Aching, Grimacing, Guarding ?Pain Intervention(s): Limited activity within patient's tolerance, Monitored during session, Repositioned ? ?Home Living Family/patient expects to be discharged to:: Private residence ?Living Arrangements: Spouse/significant other ?Available Help at Discharge: Family;Available PRN/intermittently ?Type of Home: House ?Home Access: Stairs to enter ?Entrance Stairs-Number of Steps: "a few" ?  ?Home Layout: One level ?  ?  ?  ?  ?  ?  ?  ?Home Equipment: Kasandra Knudsen - single point ?  ?  ?  ? ?  ?   ? ?Frequency ? Min 2X/week  ? ? ? ? ?  ?Progress Toward Goals ? ?OT Goals(current goals can now be found in the care plan section) ? Progress towards OT goals: Progressing toward goals ? ?Acute Rehab OT Goals ?Patient Stated Goal: to go home ?OT Goal Formulation: With patient ?Time For Goal Achievement: 05/06/21 ?Potential to Achieve Goals: Fair  ?Plan Discharge plan remains appropriate;Frequency remains appropriate   ? ?   ?AM-PAC OT "6 Clicks" Daily Activity     ?Outcome Measure ? ? Help from another person eating meals?: A Little ?Help from another person taking care of personal grooming?: A Lot ?Help from another person toileting, which includes using toliet, bedpan, or urinal?: Total ?Help from another person bathing (including washing, rinsing, drying)?: A Lot ?Help from another person to put on and taking off regular upper body clothing?: A Lot ?Help from another person to put on and taking off regular lower body clothing?: Total ?6 Click Score: 11 ? ?  ?End of Session Equipment Utilized During Treatment: Other (comment) (sling) ? ?OT Visit Diagnosis: Unsteadiness on feet (R26.81);Repeated falls (R29.6);Muscle weakness (generalized) (M62.81);Pain ?Pain - Right/Left: Right ?Pain - part of body: Arm ?  ?Activity Tolerance  Patient limited by pain ?  ?Patient Left in bed;with call bell/phone within reach;with nursing/sitter in room ?  ?Nurse Communication Mobility status ?  ? ?   ? ?Time: 4270-6237 ?OT Time Calculation (min): 24 min ? ?Charges: OT General Charges ?$OT Visit: 1 Visit ?OT Treatments ?$Self Care/Home Management : 23-37 mins ? ?Darleen Crocker, [place], OTR/L , CBIS ?ascom [phone]  ?04/23/21, 3:26 PM  ?

## 2021-04-23 NOTE — Progress Notes (Addendum)
PROGRESS NOTE  Lynn Norton  ZOX:[id number] DOB: 1950-07-13 DOA: 04/22/2021 PCP: [name], MD   Brief Narrative: Lynn Norton is a 71 y.o. female with history of arthritis who presented from home with complaints of multiple falls over last few days, pain on the right elbow.  Patient lives with her husband in a house.  She ambulates with help of cane.  Over the last several weeks, she has been weak, and steady and fallen several times.X-ray of the right elbow showed nondisplaced mildly comminuted fracture of right trochlea.  Orthopedics consulted.  Sling applied.  Lab work showed severe leukocytosis, AKI, elevated procalcitonin.  UA was strongly suggestive of UTI.  Patient admitted for the management of sepsis secondary to UTI, right elbow fracture.  Blood cultures are showing E. coli.  Nephrology also consulted for AKI.   Assessment & Plan:  Principal Problem:   Sepsis secondary to UTI [facility]) Active Problems:   Bacteremia   Closed fracture dislocation of right elbow   AKI (acute kidney injury) ([place])   Hyponatremia   Frequent falls   Assessment and Plan: * Sepsis secondary to UTI [facility]) Presented with elevated leukocytes, elevated procalcitonin, AKI.  Source of sepsis could be UTI.  UA was strongly suggestive of UTI.  Continue current antibiotics.  Follow-up cultures.  Patient complains of dysuria.  Currently afebrile, normotensive  Bacteremia Blood cultures showing E. coli.  Patient is allergic to penicillin, cephalosporin.  Currently on aztreonam follow-up final culture and sensitivity.  Closed fracture dislocation of right elbow Presented with fall from home.  Complained of right elbow pain on presentation.  X-ray of the right elbow showed nondisplaced mildly comminuted fracture of right trochlea.  Currently in a sling.  Orthopedics consulted and following.  Continue pain management, supportive care.  PT/OT consulted  AKI (acute kidney injury) ([place]) No history of chronic  kidney disease as per the patient.  Recent blood works not on system.  Presented with creatinine in the range of 2.1.  Thought to be secondary to decreased oral intake, dehydration.   Kidney function did not improve with IV fluids.  Renal ultrasound did not show any hydronephrosis or obstruction .mild elevated BNP.  Urine sodium 40.  Nephrology consulted today.  This could be CKD as well  Hyponatremia Thought to be secondary to hypovolemic hyponatremia.  Improving .Marland Kitchen  Monitor BMP  Frequent falls Following several times at home.  History of arthritis.  Ambulates with the help of cane.  Lives with husband.  PT/OT consulted.           DVT prophylaxis:heparin injection 5,000 Units Start: 04/22/21 1400     Code Status: Full Code  Family Communication: called and discussed with husband on phone on 3/2  Patient status:Inpatient  Patient is from :Home  Anticipated discharge WJ:XBJY vs SnF  Estimated DC date:Unsure    Consultants: Orthopedics, nephrology  Procedures: None yet  Antimicrobials:  Anti-infectives (From admission, onward)    Start     Dose/Rate Route Frequency Ordered Stop   04/24/21 0800  levofloxacin (LEVAQUIN) IVPB 500 mg  Status:  Discontinued        500 mg 100 mL/hr over 60 Minutes Intravenous Every 48 hours 04/22/21 1226 04/23/21 0132   04/24/21 0800  levofloxacin (LEVAQUIN) IVPB 500 mg  Status:  Discontinued        500 mg 100 mL/hr over 60 Minutes Intravenous Every 48 hours 04/23/21 0136 04/23/21 1021   04/23/21 1400  aztreonam (AZACTAM) 1 g in sodium  chloride 0.9 % 100 mL IVPB        1 g 200 mL/hr over 30 Minutes Intravenous Every 8 hours 04/23/21 1021     04/23/21 1000  ertapenem (INVANZ) 1,000 mg in sodium chloride 0.9 % 100 mL IVPB  Status:  Discontinued        1 g 200 mL/hr over 30 Minutes Intravenous Every 24 hours 04/22/21 1040 04/22/21 1226   04/23/21 0000  ertapenem (INVANZ) 1,000 mg in sodium chloride 0.9 % 100 mL IVPB  Status:  Discontinued         1 g 200 mL/hr over 30 Minutes Intravenous Every 24 hours 04/22/21 1040 04/22/21 1040   04/22/21 1015  levofloxacin (LEVAQUIN) IVPB 750 mg        750 mg 100 mL/hr over 90 Minutes Intravenous  Once 04/22/21 1008 04/22/21 1213       Subjective: Patient seen and examined at the bedside this morning.  Hemodynamically stable.  She just came from her ultrasound.  Overall looks comfortable.  Denies any complaints.  Having urine output.  Objective: Vitals:   04/22/21 1940 04/22/21 2328 04/23/21 0453 04/23/21 0850  BP: 128/61 (!) 123/51 (!) 137/59 (!) 132/58  Pulse: 90 92 89 91  Resp: 20 20 20 18   Temp: 98.7 F (37.1 C) 98.5 F (36.9 C) 98.8 F (37.1 C) 98.8 F (37.1 C)  TempSrc:      SpO2: 99% 93% 93% 98%  Weight:      Height:        Intake/Output Summary (Last 24 hours) at 04/23/2021 1049 Last data filed at 04/23/2021 1000 Gross per 24 hour  Intake 2636.59 ml  Output 1000 ml  Net 1636.59 ml   Filed Weights   04/22/21 1100 04/22/21 1900  Weight: 45.8 kg 46.1 kg    Examination:  General exam: Overall comfortable, not in distress, very deconditioned, chronically ill looking HEENT: PERRL Respiratory system:  no wheezes or crackles  Cardiovascular system: S1 & S2 heard, RRR.  Gastrointestinal system: Abdomen is nondistended, soft and nontender. Central nervous system: Alert and oriented Extremities: No edema, no clubbing ,no cyanosis, tenderness on the right upper extremity with sling Skin: No rashes, no ulcers,no icterus     Data Reviewed: I have personally reviewed following labs and imaging studies  CBC: Recent Labs  Lab 04/22/21 0734 04/23/21 0324  WBC 30.7* 25.1*  NEUTROABS 25.9*  --   HGB 12.5 10.9*  HCT 35.6* 31.2*  MCV 98.3 98.7  PLT 253 920   Basic Metabolic Panel: Recent Labs  Lab 04/22/21 0734 04/23/21 0324  NA 130* 132*  K 4.4 4.9  CL 97* 100  CO2 24 22  GLUCOSE 129* 107*  BUN 34* 37*  CREATININE 2.10* 2.26*  CALCIUM 7.3* 7.1*      Recent Results (from the past 240 hour(s))  Culture, blood (single)     Status: None (Preliminary result)   Collection Time: 04/22/21 10:18 AM   Specimen: BLOOD  Result Value Ref Range Status   Specimen Description   Final    BLOOD LEFT AC Performed at [facility], [address] [place], [place] [address]    Special Requests   Final    BOTTLES DRAWN AEROBIC AND ANAEROBIC Blood Culture adequate volume Performed at [facility], [address] [place], [place] [address]    Culture  Setup Time   Final    GRAM NEGATIVE RODS IN BOTH AEROBIC AND ANAEROBIC BOTTLES CRITICAL RESULT CALLED TO, READ  BACK BY AND VERIFIED WITH: JASON ROBBINS @ 6962 ON 04/23/2021.Marland KitchenMarland KitchenTKR Performed at [facility], [place] [address], [place], [place] [address]    Culture GRAM NEGATIVE RODS  Final   Report Status PENDING  Incomplete  Blood Culture ID Panel (Reflexed)     Status: Abnormal   Collection Time: 04/22/21 10:18 AM  Result Value Ref Range Status   Enterococcus faecalis NOT DETECTED NOT DETECTED Final   Enterococcus Faecium NOT DETECTED NOT DETECTED Final   Listeria monocytogenes NOT DETECTED NOT DETECTED Final   Staphylococcus species NOT DETECTED NOT DETECTED Final   Staphylococcus aureus (BCID) NOT DETECTED NOT DETECTED Final   Staphylococcus epidermidis NOT DETECTED NOT DETECTED Final   Staphylococcus lugdunensis NOT DETECTED NOT DETECTED Final   Streptococcus species NOT DETECTED NOT DETECTED Final   Streptococcus agalactiae NOT DETECTED NOT DETECTED Final   Streptococcus pneumoniae NOT DETECTED NOT DETECTED Final   Streptococcus pyogenes NOT DETECTED NOT DETECTED Final   A.calcoaceticus-baumannii NOT DETECTED NOT DETECTED Final   Bacteroides fragilis NOT DETECTED NOT DETECTED Final   Enterobacterales DETECTED (A) NOT DETECTED Final    Comment: Enterobacterales represent a large order of gram negative bacteria, not a single organism. CRITICAL RESULT CALLED TO, READ  BACK BY AND VERIFIED WITH: JASON ROBBINS @ 1324 ON 04/23/2021.Marland KitchenMarland KitchenTKR    Enterobacter cloacae complex NOT DETECTED NOT DETECTED Final   Escherichia coli DETECTED (A) NOT DETECTED Final    Comment: CRITICAL RESULT CALLED TO, READ BACK BY AND VERIFIED WITH: JASON ROBBINS @ 4010 ON 04/23/2021.Marland KitchenMarland KitchenTKR    Klebsiella aerogenes NOT DETECTED NOT DETECTED Final   Klebsiella oxytoca NOT DETECTED NOT DETECTED Final   Klebsiella pneumoniae NOT DETECTED NOT DETECTED Final   Proteus species NOT DETECTED NOT DETECTED Final   Salmonella species NOT DETECTED NOT DETECTED Final   Serratia marcescens NOT DETECTED NOT DETECTED Final   Haemophilus influenzae NOT DETECTED NOT DETECTED Final   Neisseria meningitidis NOT DETECTED NOT DETECTED Final   Pseudomonas aeruginosa NOT DETECTED NOT DETECTED Final   Stenotrophomonas maltophilia NOT DETECTED NOT DETECTED Final   Candida albicans NOT DETECTED NOT DETECTED Final   Candida auris NOT DETECTED NOT DETECTED Final   Candida glabrata NOT DETECTED NOT DETECTED Final   Candida krusei NOT DETECTED NOT DETECTED Final   Candida parapsilosis NOT DETECTED NOT DETECTED Final   Candida tropicalis NOT DETECTED NOT DETECTED Final   Cryptococcus neoformans/gattii NOT DETECTED NOT DETECTED Final   CTX-M ESBL NOT DETECTED NOT DETECTED Final   Carbapenem resistance IMP NOT DETECTED NOT DETECTED Final   Carbapenem resistance KPC NOT DETECTED NOT DETECTED Final   Carbapenem resistance NDM NOT DETECTED NOT DETECTED Final   Carbapenem resist OXA 48 LIKE NOT DETECTED NOT DETECTED Final   Carbapenem resistance VIM NOT DETECTED NOT DETECTED Final    Comment: Performed at [facility], [address] [place],  [address]  Resp Panel by RT-PCR (Flu A&B, Covid) Nasopharyngeal Swab     Status: None   Collection Time: 04/22/21  5:07 PM   Specimen: Nasopharyngeal Swab; Nasopharyngeal(NP) swabs in vial transport medium  Result Value Ref Range Status   SARS Coronavirus 2 by RT  PCR NEGATIVE NEGATIVE Final    Comment: (NOTE) SARS-CoV-2 target nucleic acids are NOT DETECTED.  The SARS-CoV-2 RNA is generally detectable in upper respiratory specimens during the acute phase of infection. The lowest concentration of SARS-CoV-2 viral copies this assay can detect is 138 copies/mL. A negative result does not preclude SARS-Cov-2 infection and  should not be used as the sole basis for treatment or other patient management decisions. A negative result may occur with  improper specimen collection/handling, submission of specimen other than nasopharyngeal swab, presence of viral mutation(s) within the areas targeted by this assay, and inadequate number of viral copies(<138 copies/mL). A negative result must be combined with clinical observations, patient history, and epidemiological information. The expected result is Negative.  Fact Sheet for Patients:  EntrepreneurPulse.com.au  Fact Sheet for Healthcare Providers:  IncredibleEmployment.be  This test is no t yet approved or cleared by the [place] FDA and  has been authorized for detection and/or diagnosis of SARS-CoV-2 by FDA under an Emergency Use Authorization (EUA). This EUA will remain  in effect (meaning this test can be used) for the duration of the COVID-19 declaration under Section 564(b)(1) of the Act, 21 U.S.C.section 360bbb-3(b)(1), unless the authorization is terminated  or revoked sooner.       Influenza A by PCR NEGATIVE NEGATIVE Final   Influenza B by PCR NEGATIVE NEGATIVE Final    Comment: (NOTE) The Xpert Xpress SARS-CoV-2/FLU/RSV plus assay is intended as an aid in the diagnosis of influenza from Nasopharyngeal swab specimens and should not be used as a sole basis for treatment. Nasal washings and aspirates are unacceptable for Xpert Xpress SARS-CoV-2/FLU/RSV testing.  Fact Sheet for Patients: EntrepreneurPulse.com.au  Fact Sheet for  Healthcare Providers: IncredibleEmployment.be  This test is not yet approved or cleared by the [place] FDA and has been authorized for detection and/or diagnosis of SARS-CoV-2 by FDA under an Emergency Use Authorization (EUA). This EUA will remain in effect (meaning this test can be used) for the duration of the COVID-19 declaration under Section 564(b)(1) of the Act, 21 U.S.C. section 360bbb-3(b)(1), unless the authorization is terminated or revoked.  Performed at [facility], [address], [place], [place] [address]   Surgical PCR screen     Status: None   Collection Time: 04/22/21  5:10 PM   Specimen: Nasal Mucosa; Nasal Swab  Result Value Ref Range Status   MRSA, PCR NEGATIVE NEGATIVE Final   Staphylococcus aureus NEGATIVE NEGATIVE Final    Comment: (NOTE) The Xpert SA Assay (FDA approved for NASAL specimens in patients 43 years of age and older), is one component of a comprehensive surveillance program. It is not intended to diagnose infection nor to guide or monitor treatment. Performed at [facility], [address] [place], [place] [address]   MRSA Next Gen by PCR, Nasal     Status: None   Collection Time: 04/22/21  6:42 PM   Specimen: Nasal Mucosa; Nasal Swab  Result Value Ref Range Status   MRSA by PCR Next Gen NOT DETECTED NOT DETECTED Final    Comment: (NOTE) The GeneXpert MRSA Assay (FDA approved for NASAL specimens only), is one component of a comprehensive MRSA colonization surveillance program. It is not intended to diagnose MRSA infection nor to guide or monitor treatment for MRSA infections. Test performance is not FDA approved in patients less than 20 years old. Performed at [facility], [address] [place], [place] [address]   CULTURE, BLOOD (ROUTINE X 2) w Reflex to ID Panel     Status: None (Preliminary result)   Collection Time: 04/22/21  6:48 PM   Specimen: BLOOD  Result Value Ref  Range Status   Specimen Description BLOOD BLOOD RIGHT HAND  Final   Special Requests   Final    BOTTLES DRAWN AEROBIC AND ANAEROBIC Blood  Culture adequate volume   Culture   Final    NO GROWTH < 24 HOURS Performed at [facility], [address] [place], [place] [address]    Report Status PENDING  Incomplete  CULTURE, BLOOD (ROUTINE X 2) w Reflex to ID Panel     Status: None (Preliminary result)   Collection Time: 04/22/21  7:03 PM   Specimen: BLOOD  Result Value Ref Range Status   Specimen Description BLOOD BLOOD LEFT HAND  Final   Special Requests   Final    BOTTLES DRAWN AEROBIC AND ANAEROBIC Blood Culture adequate volume   Culture   Final    NO GROWTH < 24 HOURS Performed at [facility], [address], [place], [place] [address]    Report Status PENDING  Incomplete     Radiology Studies: DG Shoulder Right  Result Date: 04/22/2021 CLINICAL DATA:  Status post fall.  Right shoulder pain. EXAM: RIGHT SHOULDER - 2+ VIEW COMPARISON:  None. FINDINGS: Post right humeral arthroplasty. No evidence of acute fracture. High riding prostatic humeral head on the glenoid, suggestive of chronic rotator cuff injury. The visualized lung parenchyma is clear. IMPRESSION: 1. No acute fracture or dislocation identified about the right shoulder. 2. Post right humeral arthroplasty. 3. Chronic rotator cuff injury. Electronically Signed   By: Fidela Salisbury M.D.   On: 04/22/2021 08:39   DG Elbow Complete Right  Result Date: 04/22/2021 CLINICAL DATA:  Status post fall EXAM: RIGHT ELBOW - COMPLETE 3+ VIEW COMPARISON:  None. FINDINGS: Generalized osteopenia. Nondisplaced mildly comminuted fracture of right trochlea. No other fracture or dislocation. No aggressive osseous lesion. Mild osteoarthritis of the right elbow. No soft tissue abnormality. IMPRESSION: 1. Nondisplaced mildly comminuted fracture of right trochlea. Electronically Signed   By: Kathreen Devoid M.D.   On: 04/22/2021 08:37    CT Head Wo Contrast  Result Date: 04/22/2021 CLINICAL DATA:  Multiple falls over the last several days. EXAM: CT HEAD WITHOUT CONTRAST CT CERVICAL SPINE WITHOUT CONTRAST TECHNIQUE: Multidetector CT imaging of the head and cervical spine was performed following the standard protocol without intravenous contrast. Multiplanar CT image reconstructions of the cervical spine were also generated. RADIATION DOSE REDUCTION: This exam was performed according to the departmental dose-optimization program which includes automated exposure control, adjustment of the mA and/or kV according to patient size and/or use of iterative reconstruction technique. COMPARISON:  None. FINDINGS: Brain: No evidence of acute infarction, hemorrhage, extra-axial collection, ventriculomegaly, or mass effect. Generalized cerebral atrophy. Periventricular white matter low attenuation likely secondary to microangiopathy. Vascular: Cerebrovascular atherosclerotic calcifications are noted. No hyperdense vessels. Skull: Negative for fracture or focal lesion. Sinuses/Orbits: Visualized portions of the orbits are unremarkable. Visualized portions of the paranasal sinuses are unremarkable. Visualized portions of the mastoid air cells are unremarkable. Other: None. CT CERVICAL SPINE FINDINGS Alignment: Normal. Skull base and vertebrae: No acute fracture. No primary bone lesion or focal pathologic process. Soft tissues and spinal canal: No prevertebral fluid or swelling. No visible canal hematoma. Disc levels: Degenerative disease with disc height loss at C4-5, C5-6, C6-7 and C7-T1. Mild bilateral facet arthropathy at C4-5, C5-6, C6-7 and C7-T1. Upper chest: Lung apices are clear. Other: No fluid collection or hematoma. Bilateral carotid artery atherosclerosis. IMPRESSION: 1. No acute intracranial pathology. 2.  No acute osseous injury of the cervical spine. 3. Cervical spine spondylosis as described above. Electronically Signed   By: Kathreen Devoid M.D.    On: 04/22/2021 09:38   CT Cervical Spine Wo Contrast  Result Date: 04/22/2021 CLINICAL DATA:  Multiple falls over the last several days. EXAM: CT HEAD WITHOUT CONTRAST CT CERVICAL SPINE WITHOUT CONTRAST TECHNIQUE: Multidetector CT imaging of the head and cervical spine was performed following the standard protocol without intravenous contrast. Multiplanar CT image reconstructions of the cervical spine were also generated. RADIATION DOSE REDUCTION: This exam was performed according to the departmental dose-optimization program which includes automated exposure control, adjustment of the mA and/or kV according to patient size and/or use of iterative reconstruction technique. COMPARISON:  None. FINDINGS: Brain: No evidence of acute infarction, hemorrhage, extra-axial collection, ventriculomegaly, or mass effect. Generalized cerebral atrophy. Periventricular white matter low attenuation likely secondary to microangiopathy. Vascular: Cerebrovascular atherosclerotic calcifications are noted. No hyperdense vessels. Skull: Negative for fracture or focal lesion. Sinuses/Orbits: Visualized portions of the orbits are unremarkable. Visualized portions of the paranasal sinuses are unremarkable. Visualized portions of the mastoid air cells are unremarkable. Other: None. CT CERVICAL SPINE FINDINGS Alignment: Normal. Skull base and vertebrae: No acute fracture. No primary bone lesion or focal pathologic process. Soft tissues and spinal canal: No prevertebral fluid or swelling. No visible canal hematoma. Disc levels: Degenerative disease with disc height loss at C4-5, C5-6, C6-7 and C7-T1. Mild bilateral facet arthropathy at C4-5, C5-6, C6-7 and C7-T1. Upper chest: Lung apices are clear. Other: No fluid collection or hematoma. Bilateral carotid artery atherosclerosis. IMPRESSION: 1. No acute intracranial pathology. 2.  No acute osseous injury of the cervical spine. 3. Cervical spine spondylosis as described above. Electronically  Signed   By: Kathreen Devoid M.D.   On: 04/22/2021 09:38   US RENAL  Result Date: 04/23/2021 CLINICAL DATA:  Acute kidney injury EXAM: RENAL / URINARY TRACT ULTRASOUND COMPLETE COMPARISON:  None. FINDINGS: Right Kidney: Renal measurements: 11.8 x 5.9 x 4.3 cm = volume: 155 mL. Echogenicity within normal limits. No mass or hydronephrosis visualized. Right extrarenal pelvis. Left Kidney: Renal measurements: 10.6 x 6 x 5.5 cm = volume: 185 mL. Echogenicity within normal limits. No mass or hydronephrosis visualized. Bladder: No bladder wall thickening. Debris dependently along the bladder base. Other: None. IMPRESSION: 1. Normal renal ultrasound. Electronically Signed   By: Kathreen Devoid M.D.   On: 04/23/2021 10:07   DG Chest Portable 1 View  Result Date: 04/22/2021 CLINICAL DATA:  Multiple falls over the last several days. EXAM: PORTABLE CHEST 1 VIEW COMPARISON:  None. FINDINGS: No focal consolidation. No pleural effusion or pneumothorax. Heart and mediastinal contours are unremarkable. No acute osseous abnormality. Right shoulder arthroplasty. Loss of the normal left acromiohumeral distance as can be seen with a chronic rotator cuff tear. IMPRESSION: No active disease. Electronically Signed   By: Kathreen Devoid M.D.   On: 04/22/2021 08:38    Scheduled Meds:  heparin  5,000 Units Subcutaneous Q8H   Continuous Infusions:  aztreonam       LOS: 1 day   Shelly Coss, MD [facility] P3/03/2021, 10:49 AM

## 2021-04-24 DIAGNOSIS — M069 Rheumatoid arthritis, unspecified: Secondary | ICD-10-CM

## 2021-04-24 DIAGNOSIS — Z720 Tobacco use: Secondary | ICD-10-CM

## 2021-04-24 DIAGNOSIS — A419 Sepsis, unspecified organism: Secondary | ICD-10-CM | POA: Diagnosis not present

## 2021-04-24 DIAGNOSIS — N390 Urinary tract infection, site not specified: Secondary | ICD-10-CM | POA: Diagnosis not present

## 2021-04-24 LAB — BASIC METABOLIC PANEL
Anion gap: 6 (ref 5–15)
BUN: 41 mg/dL — ABNORMAL HIGH (ref 8–23)
CO2: 21 mmol/L — ABNORMAL LOW (ref 22–32)
Calcium: 7 mg/dL — ABNORMAL LOW (ref 8.9–10.3)
Chloride: 107 mmol/L (ref 98–111)
Creatinine, Ser: 2.23 mg/dL — ABNORMAL HIGH (ref 0.44–1.00)
GFR, Estimated: 23 mL/min — ABNORMAL LOW (ref >60)
Glucose, Bld: 96 mg/dL (ref 70–99)
Potassium: 4.9 mmol/L (ref 3.5–5.1)
Sodium: 134 mmol/L — ABNORMAL LOW (ref 135–145)

## 2021-04-24 LAB — CBC WITH DIFFERENTIAL/PLATELET
Abs Immature Granulocytes: 0.25 10*3/uL — ABNORMAL HIGH (ref 0.00–0.07)
Basophils Absolute: 0.1 10*3/uL (ref 0.0–0.1)
Basophils Relative: 0 %
Eosinophils Absolute: 0.1 10*3/uL (ref 0.0–0.5)
Eosinophils Relative: 1 %
HCT: 32.4 % — ABNORMAL LOW (ref 36.0–46.0)
Hemoglobin: 11 g/dL — ABNORMAL LOW (ref 12.0–15.0)
Immature Granulocytes: 1 %
Lymphocytes Relative: 3 %
Lymphs Abs: 0.7 10*3/uL (ref 0.7–4.0)
MCH: 34.2 pg — ABNORMAL HIGH (ref 26.0–34.0)
MCHC: 34 g/dL (ref 30.0–36.0)
MCV: 100.6 fL — ABNORMAL HIGH (ref 80.0–100.0)
Monocytes Absolute: 2.2 10*3/uL — ABNORMAL HIGH (ref 0.1–1.0)
Monocytes Relative: 9 %
Neutro Abs: 20.8 10*3/uL — ABNORMAL HIGH (ref 1.7–7.7)
Neutrophils Relative %: 86 %
Platelets: 352 10*3/uL (ref 150–400)
RBC: 3.22 MIL/uL — ABNORMAL LOW (ref 3.87–5.11)
RDW: 14.3 % (ref 11.5–15.5)
WBC: 24.2 10*3/uL — ABNORMAL HIGH (ref 4.0–10.5)
nRBC: 0 % (ref 0.0–0.2)

## 2021-04-24 LAB — ENA+DNA/DS+SJORGEN'S
ENA SM Ab Ser-aCnc: <0.2 AI (ref 0.0–0.9)
Ribonucleic Protein: <0.2 AI (ref 0.0–0.9)
SSA (Ro) (ENA) Antibody, IgG: <0.2 AI (ref 0.0–0.9)
SSB (La) (ENA) Antibody, IgG: 6.1 AI — ABNORMAL HIGH (ref 0.0–0.9)
ds DNA Ab: 2 IU/mL (ref 0–9)

## 2021-04-24 LAB — C3 COMPLEMENT: C3 Complement: 116 mg/dL (ref 82–167)

## 2021-04-24 LAB — HEPATITIS B SURFACE ANTIBODY, QUANTITATIVE: Hep B S AB Quant (Post): <3.1 m[IU]/mL — ABNORMAL LOW (ref >9.9)

## 2021-04-24 LAB — ANA W/REFLEX: Anti Nuclear Antibody (ANA): POSITIVE — AB

## 2021-04-24 LAB — PROCALCITONIN: Procalcitonin: 1.5 ng/mL

## 2021-04-24 LAB — KAPPA/LAMBDA LIGHT CHAINS
Kappa free light chain: 47.3 mg/L — ABNORMAL HIGH (ref 3.3–19.4)
Kappa, lambda light chain ratio: 1.14 (ref 0.26–1.65)
Lambda free light chains: 41.6 mg/L — ABNORMAL HIGH (ref 5.7–26.3)

## 2021-04-24 LAB — C4 COMPLEMENT: Complement C4, Body Fluid: 23 mg/dL (ref 12–38)

## 2021-04-24 MED ORDER — TRAZODONE HCL 50 MG PO TABS
50.0000 mg | ORAL_TABLET | Freq: Every day | ORAL | Status: DC
  Administered 2021-04-24 – 2021-04-28 (×5): 50 mg via ORAL
  Filled 2021-04-24 (×5): qty 1

## 2021-04-24 MED ORDER — NICOTINE 14 MG/24HR TD PT24
14.0000 mg | MEDICATED_PATCH | Freq: Every day | TRANSDERMAL | Status: DC
  Administered 2021-04-24 – 2021-04-29 (×6): 14 mg via TRANSDERMAL
  Filled 2021-04-24 (×8): qty 1

## 2021-04-24 NOTE — Progress Notes (Signed)
PROGRESS NOTE  Lynn Norton  URK:[id number] DOB: [birth year] DOA: 04/22/2021 PCP: [name], MD   Brief Narrative: Lynn Norton is a 71 y.o. female with history of rheumatoid arthritis who presented from home with complaints of multiple falls over last few days, pain on the right elbow.  Patient lives with her husband in a house.  She ambulates with help of cane.  Over the last several weeks, she has been weak, and steady and fallen several times.X-ray of the right elbow showed nondisplaced mildly comminuted fracture of right trochlea.  Orthopedics consulted.  Sling applied.  Lab work showed severe leukocytosis, AKI, elevated procalcitonin.  UA was strongly suggestive of UTI.  Patient admitted for the management of sepsis secondary to UTI, right elbow fracture.  Blood cultures are showing E. coli.  Nephrology also consulted for AKI.  Ortho planning for ORIF on Monday   Assessment & Plan:  Principal Problem:   Sepsis secondary to UTI [facility]) Active Problems:   Bacteremia   AKI (acute kidney injury) ([place])   Closed fracture dislocation of right elbow   Rheumatoid arthritis ([place])   Tobacco use   Hyponatremia   Frequent falls   Assessment and Plan: * Sepsis secondary to UTI [facility]) Presented with elevated leukocytes, elevated procalcitonin, AKI.  Source is UTI.  She was complaining of dysuria.  UA was strongly suggestive of UTI. Currently afebrile, normotensive.  Currently on aztreonam.  Bacteremia Blood cultures showing E. coli.  Patient is allergic to penicillin, cephalosporin.  Currently on aztreonam ,follow-up final culture and sensitivity.  AKI (acute kidney injury) ([place]) No history of chronic kidney disease as per the patient.  Recent blood works not on system.  Presented with creatinine in the range of 2.1.  Thought to be secondary to decreased oral intake, dehydration.    Renal ultrasound did not show any hydronephrosis or obstruction .mild elevated BNP.  Urine sodium 40.   Nephrology consulted , continue on IV fluids.  Closed fracture dislocation of right elbow Presented with fall from home.  Complained of right elbow pain on presentation.  X-ray of the right elbow showed nondisplaced mildly comminuted fracture of right trochlea.  Currently in a sling.  Orthopedics consulted and following.  Plan for ORIF on Monday  Rheumatoid arthritis ([place]) Takes methotrexate and prednisone at home.  Currently on hold  Tobacco use Smokes 20 cigarettes a day.  Counseled for cessation.  Offered nicotine patch  Frequent falls Falling several times at home.  History of arthritis.  Ambulates with the help of cane.  Lives with husband.  PT/OT consulted, recommended skilled nursing facility on discharge  Hyponatremia Thought to be secondary to hypovolemic hyponatremia.  Improving .Marland Kitchen  Monitor BMP      Nutrition Problem: Increased nutrient needs Etiology: post-op healing    DVT prophylaxis:heparin injection 5,000 Units Start: 04/22/21 1400     Code Status: Full Code  Family Communication: called and discussed with husband on phone on 3/2  Patient status:Inpatient  Patient is from :Home  Anticipated discharge to:[facility]  Estimated DC date:Unsure    Consultants: Orthopedics, nephrology  Procedures: None yet  Antimicrobials:  Anti-infectives (From admission, onward)    Start     Dose/Rate Route Frequency Ordered Stop   04/24/21 0800  levofloxacin (LEVAQUIN) IVPB 500 mg  Status:  Discontinued        500 mg 100 mL/hr over 60 Minutes Intravenous Every 48 hours 04/22/21 1226 04/23/21 0132   04/24/21 0800  levofloxacin (LEVAQUIN) IVPB 500 mg  Status:  Discontinued        500 mg 100 mL/hr over 60 Minutes Intravenous Every 48 hours 04/23/21 0136 04/23/21 1021   04/23/21 1400  aztreonam (AZACTAM) 1 g in sodium chloride 0.9 % 100 mL IVPB        1 g 200 mL/hr over 30 Minutes Intravenous Every 8 hours 04/23/21 1021     04/23/21 1000  ertapenem (INVANZ) 1,000 mg in  sodium chloride 0.9 % 100 mL IVPB  Status:  Discontinued        1 g 200 mL/hr over 30 Minutes Intravenous Every 24 hours 04/22/21 1040 04/22/21 1226   04/23/21 0000  ertapenem (INVANZ) 1,000 mg in sodium chloride 0.9 % 100 mL IVPB  Status:  Discontinued        1 g 200 mL/hr over 30 Minutes Intravenous Every 24 hours 04/22/21 1040 04/22/21 1040   04/22/21 1015  levofloxacin (LEVAQUIN) IVPB 750 mg        750 mg 100 mL/hr over 90 Minutes Intravenous  Once 04/22/21 1008 04/22/21 1213       Subjective: Patient seen and examined at the bedside this morning.  Hemodynamically stable.  Feels better today.  No significant pain on the fracture site.  Denies any new complaints.  Requesting for nicotine patch.  Objective: Vitals:   04/23/21 2330 04/24/21 0433 04/24/21 0600 04/24/21 0925  BP:  (!) 147/66  129/61  Pulse:  91  88  Resp:  20  15  Temp:  98 F (36.7 C)  98.1 F (36.7 C)  TempSrc:      SpO2: 92% 91% 97% 93%  Weight:      Height:        Intake/Output Summary (Last 24 hours) at 04/24/2021 1141 Last data filed at 04/24/2021 3151 Gross per 24 hour  Intake 1067.15 ml  Output 1350 ml  Net -282.85 ml   Filed Weights   04/22/21 1100 04/22/21 1900  Weight: 45.8 kg 46.1 kg    Examination:   General exam: Overall comfortable, very deconditioned, chronically ill looking, malnourished HEENT: PERRL Respiratory system:  no wheezes or crackles  Cardiovascular system: S1 & S2 heard, RRR.  Gastrointestinal system: Abdomen is nondistended, soft and nontender. Central nervous system: Alert and oriented Extremities: No edema, no clubbing ,no cyanosis, right upper extremity sling Skin: No rashes, no ulcers,no icterus     Data Reviewed: I have personally reviewed following labs and imaging studies  CBC: Recent Labs  Lab 04/22/21 0734 04/23/21 0324 04/24/21 0553  WBC 30.7* 25.1* 24.2*  NEUTROABS 25.9*  --  20.8*  HGB 12.5 10.9* 11.0*  HCT 35.6* 31.2* 32.4*  MCV 98.3 98.7 100.6*   PLT 253 296 761   Basic Metabolic Panel: Recent Labs  Lab 04/22/21 0734 04/23/21 0324 04/24/21 0553  NA 130* 132* 134*  K 4.4 4.9 4.9  CL 97* 100 107  CO2 24 22 21*  GLUCOSE 129* 107* 96  BUN 34* 37* 41*  CREATININE 2.10* 2.26* 2.23*  CALCIUM 7.3* 7.1* 7.0*     Recent Results (from the past 240 hour(s))  Urine Culture     Status: Abnormal (Preliminary result)   Collection Time: 04/22/21  9:29 AM   Specimen: Urine, Clean Catch  Result Value Ref Range Status   Specimen Description   Final    URINE, CLEAN CATCH Performed at [facility], [address] [place], [place] [address]    Special Requests   Final    NONE Performed  at [facility], [address], [place], [place] [address]    Culture (A)  Final    >=100,000 COLONIES/mL ESCHERICHIA COLI SUSCEPTIBILITIES TO FOLLOW Performed at [place] [address], [place], [place] [address]    Report Status PENDING  Incomplete  Culture, blood (single)     Status: Abnormal (Preliminary result)   Collection Time: 04/22/21 10:18 AM   Specimen: BLOOD  Result Value Ref Range Status   Specimen Description   Final    BLOOD LEFT AC Performed at [facility], [address], [place], [place] [address]    Special Requests   Final    BOTTLES DRAWN AEROBIC AND ANAEROBIC Blood Culture adequate volume Performed at [facility], [address] [place], [place] [address]    Culture  Setup Time   Final    GRAM NEGATIVE RODS IN BOTH AEROBIC AND ANAEROBIC BOTTLES CRITICAL RESULT CALLED TO, READ BACK BY AND VERIFIED WITH: JASON ROBBINS @ [phone] ON 04/23/2021.Marland KitchenMarland KitchenTKR GRAM POSITIVE COCCI ANAEROBIC BOTTLE ONLY CRITICAL RESULT CALLED TO, READ BACK BY AND VERIFIED WITH: D,MITCHELL PHARMD@1048  04/23/21 EB    Culture (A)  Final    ESCHERICHIA COLI GRAM POSITIVE COCCI CULTURE REINCUBATED FOR BETTER GROWTH SUSCEPTIBILITIES TO FOLLOW Performed at [facility], [place] [address]  [place],  [address]    Report Status PENDING  Incomplete  Blood Culture ID Panel (Reflexed)     Status: Abnormal   Collection Time: 04/22/21 10:18 AM  Result Value Ref Range Status   Enterococcus faecalis NOT DETECTED NOT DETECTED Final   Enterococcus Faecium NOT DETECTED NOT DETECTED Final   Listeria monocytogenes NOT DETECTED NOT DETECTED Final   Staphylococcus species NOT DETECTED NOT DETECTED Final   Staphylococcus aureus (BCID) NOT DETECTED NOT DETECTED Final   Staphylococcus epidermidis NOT DETECTED NOT DETECTED Final   Staphylococcus lugdunensis NOT DETECTED NOT DETECTED Final   Streptococcus species NOT DETECTED NOT DETECTED Final   Streptococcus agalactiae NOT DETECTED NOT DETECTED Final   Streptococcus pneumoniae NOT DETECTED NOT DETECTED Final   Streptococcus pyogenes NOT DETECTED NOT DETECTED Final   A.calcoaceticus-baumannii NOT DETECTED NOT DETECTED Final   Bacteroides fragilis NOT DETECTED NOT DETECTED Final   Enterobacterales DETECTED (A) NOT DETECTED Final    Comment: Enterobacterales represent a large order of gram negative bacteria, not a single organism. CRITICAL RESULT CALLED TO, READ BACK BY AND VERIFIED WITH: JASON ROBBINS @ [phone] ON 04/23/2021.Marland KitchenMarland KitchenTKR    Enterobacter cloacae complex NOT DETECTED NOT DETECTED Final   Escherichia coli DETECTED (A) NOT DETECTED Final    Comment: CRITICAL RESULT CALLED TO, READ BACK BY AND VERIFIED WITH: JASON ROBBINS @ [phone] ON 04/23/2021.Marland KitchenMarland KitchenTKR    Klebsiella aerogenes NOT DETECTED NOT DETECTED Final   Klebsiella oxytoca NOT DETECTED NOT DETECTED Final   Klebsiella pneumoniae NOT DETECTED NOT DETECTED Final   Proteus species NOT DETECTED NOT DETECTED Final   Salmonella species NOT DETECTED NOT DETECTED Final   Serratia marcescens NOT DETECTED NOT DETECTED Final   Haemophilus influenzae NOT DETECTED NOT DETECTED Final   Neisseria meningitidis NOT DETECTED NOT DETECTED Final   Pseudomonas aeruginosa NOT DETECTED NOT DETECTED Final    Stenotrophomonas maltophilia NOT DETECTED NOT DETECTED Final   Candida albicans NOT DETECTED NOT DETECTED Final   Candida auris NOT DETECTED NOT DETECTED Final   Candida glabrata NOT DETECTED NOT DETECTED Final   Candida krusei NOT DETECTED NOT DETECTED Final   Candida parapsilosis NOT DETECTED NOT DETECTED Final  Candida tropicalis NOT DETECTED NOT DETECTED Final   Cryptococcus neoformans/gattii NOT DETECTED NOT DETECTED Final   CTX-M ESBL NOT DETECTED NOT DETECTED Final   Carbapenem resistance IMP NOT DETECTED NOT DETECTED Final   Carbapenem resistance KPC NOT DETECTED NOT DETECTED Final   Carbapenem resistance NDM NOT DETECTED NOT DETECTED Final   Carbapenem resist OXA 48 LIKE NOT DETECTED NOT DETECTED Final   Carbapenem resistance VIM NOT DETECTED NOT DETECTED Final    Comment: Performed at [facility], [address] [place], [place] [address]  Blood Culture ID Panel (Reflexed)     Status: Abnormal   Collection Time: 04/22/21 10:18 AM  Result Value Ref Range Status   Enterococcus faecalis NOT DETECTED NOT DETECTED Final   Enterococcus Faecium NOT DETECTED NOT DETECTED Final   Listeria monocytogenes NOT DETECTED NOT DETECTED Final   Staphylococcus species DETECTED (A) NOT DETECTED Final    Comment: CRITICAL RESULT CALLED TO, READ BACK BY AND VERIFIED WITH: D,MITCHELL PHARMD@1048  04/23/21 EB    Staphylococcus aureus (BCID) NOT DETECTED NOT DETECTED Final   Staphylococcus epidermidis NOT DETECTED NOT DETECTED Final   Staphylococcus lugdunensis NOT DETECTED NOT DETECTED Final   Streptococcus species NOT DETECTED NOT DETECTED Final   Streptococcus agalactiae NOT DETECTED NOT DETECTED Final   Streptococcus pneumoniae NOT DETECTED NOT DETECTED Final   Streptococcus pyogenes NOT DETECTED NOT DETECTED Final   A.calcoaceticus-baumannii NOT DETECTED NOT DETECTED Final   Bacteroides fragilis NOT DETECTED NOT DETECTED Final   Enterobacterales DETECTED (A) NOT DETECTED Final     Comment: Enterobacterales represent a large order of gram negative bacteria, not a single organism. CRITICAL RESULT CALLED TO, READ BACK BY AND VERIFIED WITH: D,MITCHELL PHARMD@1048  07/25/67 EB    Enterobacter cloacae complex NOT DETECTED NOT DETECTED Final   Escherichia coli DETECTED (A) NOT DETECTED Final    Comment: CRITICAL RESULT CALLED TO, READ BACK BY AND VERIFIED WITH: D,MITCHELL PHARMD@1048  04/23/21 EB    Klebsiella aerogenes NOT DETECTED NOT DETECTED Final   Klebsiella oxytoca NOT DETECTED NOT DETECTED Final   Klebsiella pneumoniae NOT DETECTED NOT DETECTED Final   Proteus species NOT DETECTED NOT DETECTED Final   Salmonella species NOT DETECTED NOT DETECTED Final   Serratia marcescens NOT DETECTED NOT DETECTED Final   Haemophilus influenzae NOT DETECTED NOT DETECTED Final   Neisseria meningitidis NOT DETECTED NOT DETECTED Final   Pseudomonas aeruginosa NOT DETECTED NOT DETECTED Final   Stenotrophomonas maltophilia NOT DETECTED NOT DETECTED Final   Candida albicans NOT DETECTED NOT DETECTED Final   Candida auris NOT DETECTED NOT DETECTED Final   Candida glabrata NOT DETECTED NOT DETECTED Final   Candida krusei NOT DETECTED NOT DETECTED Final   Candida parapsilosis NOT DETECTED NOT DETECTED Final   Candida tropicalis NOT DETECTED NOT DETECTED Final   Cryptococcus neoformans/gattii NOT DETECTED NOT DETECTED Final   CTX-M ESBL NOT DETECTED NOT DETECTED Final   Carbapenem resistance IMP NOT DETECTED NOT DETECTED Final   Carbapenem resistance KPC NOT DETECTED NOT DETECTED Final   Carbapenem resistance NDM NOT DETECTED NOT DETECTED Final   Carbapenem resist OXA 48 LIKE NOT DETECTED NOT DETECTED Final   Carbapenem resistance VIM NOT DETECTED NOT DETECTED Final    Comment: Performed at [facility], [address] [place], [place] [address]  Resp Panel by RT-PCR (Flu A&B, Covid) Nasopharyngeal Swab     Status: None   Collection Time: 04/22/21  5:07 PM   Specimen:  Nasopharyngeal Swab; Nasopharyngeal(NP) swabs in vial transport medium  Result Value Ref Range Status   SARS Coronavirus 2 by RT PCR NEGATIVE NEGATIVE Final    Comment: (NOTE) SARS-CoV-2 target nucleic acids are NOT DETECTED.  The SARS-CoV-2 RNA is generally detectable in upper respiratory specimens during the acute phase of infection. The lowest concentration of SARS-CoV-2 viral copies this assay can detect is 138 copies/mL. A negative result does not preclude SARS-Cov-2 infection and should not be used as the sole basis for treatment or other patient management decisions. A negative result may occur with  improper specimen collection/handling, submission of specimen other than nasopharyngeal swab, presence of viral mutation(s) within the areas targeted by this assay, and inadequate number of viral copies(<138 copies/mL). A negative result must be combined with clinical observations, patient history, and epidemiological information. The expected result is Negative.  Fact Sheet for Patients:  EntrepreneurPulse.com.au  Fact Sheet for Healthcare Providers:  IncredibleEmployment.be  This test is no t yet approved or cleared by the [place] FDA and  has been authorized for detection and/or diagnosis of SARS-CoV-2 by FDA under an Emergency Use Authorization (EUA). This EUA will remain  in effect (meaning this test can be used) for the duration of the COVID-19 declaration under Section 564(b)(1) of the Act, 21 U.S.C.section 360bbb-3(b)(1), unless the authorization is terminated  or revoked sooner.       Influenza A by PCR NEGATIVE NEGATIVE Final   Influenza B by PCR NEGATIVE NEGATIVE Final    Comment: (NOTE) The Xpert Xpress SARS-CoV-2/FLU/RSV plus assay is intended as an aid in the diagnosis of influenza from Nasopharyngeal swab specimens and should not be used as a sole basis for treatment. Nasal washings and aspirates are unacceptable for  Xpert Xpress SARS-CoV-2/FLU/RSV testing.  Fact Sheet for Patients: EntrepreneurPulse.com.au  Fact Sheet for Healthcare Providers: IncredibleEmployment.be  This test is not yet approved or cleared by the [place] FDA and has been authorized for detection and/or diagnosis of SARS-CoV-2 by FDA under an Emergency Use Authorization (EUA). This EUA will remain in effect (meaning this test can be used) for the duration of the COVID-19 declaration under Section 564(b)(1) of the Act, 21 U.S.C. section 360bbb-3(b)(1), unless the authorization is terminated or revoked.  Performed at [facility], [address], [place], [place] [address]   Surgical PCR screen     Status: None   Collection Time: 04/22/21  5:10 PM   Specimen: Nasal Mucosa; Nasal Swab  Result Value Ref Range Status   MRSA, PCR NEGATIVE NEGATIVE Final   Staphylococcus aureus NEGATIVE NEGATIVE Final    Comment: (NOTE) The Xpert SA Assay (FDA approved for NASAL specimens in patients 1 years of age and older), is one component of a comprehensive surveillance program. It is not intended to diagnose infection nor to guide or monitor treatment. Performed at [facility], [address] [place],  [address]   MRSA Next Gen by PCR, Nasal     Status: None   Collection Time: 04/22/21  6:42 PM   Specimen: Nasal Mucosa; Nasal Swab  Result Value Ref Range Status   MRSA by PCR Next Gen NOT DETECTED NOT DETECTED Final    Comment: (NOTE) The GeneXpert MRSA Assay (FDA approved for NASAL specimens only), is one component of a comprehensive MRSA colonization surveillance program. It is not intended to diagnose MRSA infection nor to guide or monitor treatment for MRSA infections. Test performance is not FDA approved in patients less than 63 years old. Performed at [facility], [address],  [place], [place] [address]   CULTURE, BLOOD (ROUTINE X 2) w Reflex  to ID Panel     Status: None (Preliminary result)   Collection Time: 04/22/21  6:48 PM   Specimen: BLOOD  Result Value Ref Range Status   Specimen Description BLOOD BLOOD RIGHT HAND  Final   Special Requests   Final    BOTTLES DRAWN AEROBIC AND ANAEROBIC Blood Culture adequate volume   Culture   Final    NO GROWTH 2 DAYS Performed at [facility], [address] [place], [place] [address]    Report Status PENDING  Incomplete  CULTURE, BLOOD (ROUTINE X 2) w Reflex to ID Panel     Status: None (Preliminary result)   Collection Time: 04/22/21  7:03 PM   Specimen: BLOOD  Result Value Ref Range Status   Specimen Description BLOOD BLOOD LEFT HAND  Final   Special Requests   Final    BOTTLES DRAWN AEROBIC AND ANAEROBIC Blood Culture adequate volume   Culture   Final    NO GROWTH 2 DAYS Performed at [facility], [address], [place],  [address]    Report Status PENDING  Incomplete     Radiology Studies: US RENAL  Result Date: 04/23/2021 CLINICAL DATA:  Acute kidney injury EXAM: RENAL / URINARY TRACT ULTRASOUND COMPLETE COMPARISON:  None. FINDINGS: Right Kidney: Renal measurements: 11.8 x 5.9 x 4.3 cm = volume: 155 mL. Echogenicity within normal limits. No mass or hydronephrosis visualized. Right extrarenal pelvis. Left Kidney: Renal measurements: 10.6 x 6 x 5.5 cm = volume: 185 mL. Echogenicity within normal limits. No mass or hydronephrosis visualized. Bladder: No bladder wall thickening. Debris dependently along the bladder base. Other: None. IMPRESSION: 1. Normal renal ultrasound. Electronically Signed   By: Kathreen Devoid M.D.   On: 04/23/2021 10:07    Scheduled Meds:  feeding supplement  237 mL Oral BID BM   heparin  5,000 Units Subcutaneous Q8H   multivitamin with minerals  1 tablet Oral Daily   nicotine  14 mg Transdermal Daily   Continuous Infusions:  aztreonam 1 g (04/24/21 0604)     LOS: 2 days   Shelly Coss, MD [facility] P3/04/2021, 11:41 AM

## 2021-04-24 NOTE — Progress Notes (Signed)
BUN elevated creatinine still remains elevated.  With her bacteremia we will hold off on surgery until Monday.  Discussed with patient ?

## 2021-04-24 NOTE — Progress Notes (Signed)
[facility]  ROUNDING NOTE   Subjective:   Patient seen sitting up in chair currently eating breakfast States she feels much better today Denies nausea vomiting and diarrhea Denies shortness of breath, currently on room air  Creatinine unchanged Urine output 1.6 L in past 24 hours  Objective:  Vital signs in last 24 hours:  Temp:  [98 F (36.7 C)-98.7 F (37.1 C)] 98.1 F (36.7 C) (03/03 0925) Pulse Rate:  [87-99] 88 (03/03 0925) Resp:  [15-20] 15 (03/03 0925) BP: (126-147)/(56-68) 129/61 (03/03 0925) SpO2:  [90 %-97 %] 93 % (03/03 0925)  Weight change:  Filed Weights   04/22/21 1100 04/22/21 1900  Weight: 45.8 kg 46.1 kg    Intake/Output: I/O last 3 completed shifts: In: 2556.9 [I.V.:2356.9; IV Piggyback:200] Out: 2350 [Urine:2350]   Intake/Output this shift:  No intake/output data recorded.  Physical Exam: General: NAD, sitting in chair  Head: Normocephalic, atraumatic. Moist oral mucosal membranes  Eyes: Anicteric  Lungs:  Clear to auscultation, normal effort  Heart: Regular rate and rhythm  Abdomen:  Soft, nontender  Extremities: No peripheral edema.  Neurologic: Nonfocal, moving all four extremities  Skin: No lesions       Basic Metabolic Panel: Recent Labs  Lab 04/22/21 0734 04/23/21 0324 04/24/21 0553  NA 130* 132* 134*  K 4.4 4.9 4.9  CL 97* 100 107  CO2 24 22 21*  GLUCOSE 129* 107* 96  BUN 34* 37* 41*  CREATININE 2.10* 2.26* 2.23*  CALCIUM 7.3* 7.1* 7.0*    Liver Function Tests: Recent Labs  Lab 04/22/21 0734  AST 25  ALT 30  ALKPHOS 102  BILITOT 0.8  PROT 6.1*  ALBUMIN 2.6*   No results for input(s): LIPASE, AMYLASE in the last 168 hours. No results for input(s): AMMONIA in the last 168 hours.  CBC: Recent Labs  Lab 04/22/21 0734 04/23/21 0324 04/24/21 0553  WBC 30.7* 25.1* 24.2*  NEUTROABS 25.9*  --  20.8*  HGB 12.5 10.9* 11.0*  HCT 35.6* 31.2* 32.4*  MCV 98.3 98.7 100.6*  PLT 253 296 352     Cardiac Enzymes: Recent Labs  Lab 04/22/21 0734  CKTOTAL 124    BNP: Invalid input(s): POCBNP  CBG: No results for input(s): GLUCAP in the last 168 hours.  Microbiology: Results for orders placed or performed during the hospital encounter of 04/22/21  Urine Culture     Status: Abnormal (Preliminary result)   Collection Time: 04/22/21  9:29 AM   Specimen: Urine, Clean Catch  Result Value Ref Range Status   Specimen Description   Final    URINE, CLEAN CATCH Performed at [facility], [address], [place], [place] [address]    Special Requests   Final    NONE Performed at [facility], [address] [place], [place] [address]    Culture (A)  Final    >=100,000 COLONIES/mL ESCHERICHIA COLI SUSCEPTIBILITIES TO FOLLOW Performed at [facility], [place] [address] [place], [place] [address]    Report Status PENDING  Incomplete  Culture, blood (single)     Status: Abnormal (Preliminary result)   Collection Time: 04/22/21 10:18 AM   Specimen: BLOOD  Result Value Ref Range Status   Specimen Description   Final    BLOOD LEFT AC Performed at [facility], [address], [place], [place] [address]    Special Requests   Final    BOTTLES DRAWN AEROBIC AND ANAEROBIC Blood Culture adequate volume Performed at [facility], [address] [place], [place] [address]    Culture  Setup Time   Final    GRAM NEGATIVE RODS IN BOTH AEROBIC AND ANAEROBIC BOTTLES CRITICAL RESULT CALLED TO, READ BACK BY AND VERIFIED WITH: JASON ROBBINS @ [id number] ON 04/23/2021.Marland KitchenMarland KitchenTKR GRAM POSITIVE COCCI ANAEROBIC BOTTLE ONLY CRITICAL RESULT CALLED TO, READ BACK BY AND VERIFIED WITH: D,MITCHELL PHARMD@1048  04/23/21 EB    Culture (A)  Final    ESCHERICHIA COLI GRAM POSITIVE COCCI CULTURE REINCUBATED FOR BETTER GROWTH SUSCEPTIBILITIES TO FOLLOW Performed at [facility], [place] [address] [place], [place] [address]    Report Status PENDING  Incomplete   Blood Culture ID Panel (Reflexed)     Status: Abnormal   Collection Time: 04/22/21 10:18 AM  Result Value Ref Range Status   Enterococcus faecalis NOT DETECTED NOT DETECTED Final   Enterococcus Faecium NOT DETECTED NOT DETECTED Final   Listeria monocytogenes NOT DETECTED NOT DETECTED Final   Staphylococcus species NOT DETECTED NOT DETECTED Final   Staphylococcus aureus (BCID) NOT DETECTED NOT DETECTED Final   Staphylococcus epidermidis NOT DETECTED NOT DETECTED Final   Staphylococcus lugdunensis NOT DETECTED NOT DETECTED Final   Streptococcus species NOT DETECTED NOT DETECTED Final   Streptococcus agalactiae NOT DETECTED NOT DETECTED Final   Streptococcus pneumoniae NOT DETECTED NOT DETECTED Final   Streptococcus pyogenes NOT DETECTED NOT DETECTED Final   A.calcoaceticus-baumannii NOT DETECTED NOT DETECTED Final   Bacteroides fragilis NOT DETECTED NOT DETECTED Final   Enterobacterales DETECTED (A) NOT DETECTED Final    Comment: Enterobacterales represent a large order of gram negative bacteria, not a single organism. CRITICAL RESULT CALLED TO, READ BACK BY AND VERIFIED WITH: JASON ROBBINS @ [phone] ON 04/23/2021.Marland KitchenMarland KitchenTKR    Enterobacter cloacae complex NOT DETECTED NOT DETECTED Final   Escherichia coli DETECTED (A) NOT DETECTED Final    Comment: CRITICAL RESULT CALLED TO, READ BACK BY AND VERIFIED WITH: JASON ROBBINS @ [phone] ON 04/23/2021.Marland KitchenMarland KitchenTKR    Klebsiella aerogenes NOT DETECTED NOT DETECTED Final   Klebsiella oxytoca NOT DETECTED NOT DETECTED Final   Klebsiella pneumoniae NOT DETECTED NOT DETECTED Final   Proteus species NOT DETECTED NOT DETECTED Final   Salmonella species NOT DETECTED NOT DETECTED Final   Serratia marcescens NOT DETECTED NOT DETECTED Final   Haemophilus influenzae NOT DETECTED NOT DETECTED Final   Neisseria meningitidis NOT DETECTED NOT DETECTED Final   Pseudomonas aeruginosa NOT DETECTED NOT DETECTED Final   Stenotrophomonas maltophilia NOT DETECTED NOT DETECTED Final    Candida albicans NOT DETECTED NOT DETECTED Final   Candida auris NOT DETECTED NOT DETECTED Final   Candida glabrata NOT DETECTED NOT DETECTED Final   Candida krusei NOT DETECTED NOT DETECTED Final   Candida parapsilosis NOT DETECTED NOT DETECTED Final   Candida tropicalis NOT DETECTED NOT DETECTED Final   Cryptococcus neoformans/gattii NOT DETECTED NOT DETECTED Final   CTX-M ESBL NOT DETECTED NOT DETECTED Final   Carbapenem resistance IMP NOT DETECTED NOT DETECTED Final   Carbapenem resistance KPC NOT DETECTED NOT DETECTED Final   Carbapenem resistance NDM NOT DETECTED NOT DETECTED Final   Carbapenem resist OXA 48 LIKE NOT DETECTED NOT DETECTED Final   Carbapenem resistance VIM NOT DETECTED NOT DETECTED Final    Comment: Performed at [facility], [address] [place],  [address]  Blood Culture ID Panel (Reflexed)     Status: Abnormal   Collection Time: 04/22/21 10:18 AM  Result Value Ref Range Status   Enterococcus faecalis NOT DETECTED NOT DETECTED  Final   Enterococcus Faecium NOT DETECTED NOT DETECTED Final   Listeria monocytogenes NOT DETECTED NOT DETECTED Final   Staphylococcus species DETECTED (A) NOT DETECTED Final    Comment: CRITICAL RESULT CALLED TO, READ BACK BY AND VERIFIED WITH: D,MITCHELL PHARMD@1048  04/23/21 EB    Staphylococcus aureus (BCID) NOT DETECTED NOT DETECTED Final   Staphylococcus epidermidis NOT DETECTED NOT DETECTED Final   Staphylococcus lugdunensis NOT DETECTED NOT DETECTED Final   Streptococcus species NOT DETECTED NOT DETECTED Final   Streptococcus agalactiae NOT DETECTED NOT DETECTED Final   Streptococcus pneumoniae NOT DETECTED NOT DETECTED Final   Streptococcus pyogenes NOT DETECTED NOT DETECTED Final   A.calcoaceticus-baumannii NOT DETECTED NOT DETECTED Final   Bacteroides fragilis NOT DETECTED NOT DETECTED Final   Enterobacterales DETECTED (A) NOT DETECTED Final    Comment: Enterobacterales represent a large order of gram  negative bacteria, not a single organism. CRITICAL RESULT CALLED TO, READ BACK BY AND VERIFIED WITH: D,MITCHELL PHARMD@1048  04/23/21 EB    Enterobacter cloacae complex NOT DETECTED NOT DETECTED Final   Escherichia coli DETECTED (A) NOT DETECTED Final    Comment: CRITICAL RESULT CALLED TO, READ BACK BY AND VERIFIED WITH: D,MITCHELL PHARMD@1048  04/23/21 EB    Klebsiella aerogenes NOT DETECTED NOT DETECTED Final   Klebsiella oxytoca NOT DETECTED NOT DETECTED Final   Klebsiella pneumoniae NOT DETECTED NOT DETECTED Final   Proteus species NOT DETECTED NOT DETECTED Final   Salmonella species NOT DETECTED NOT DETECTED Final   Serratia marcescens NOT DETECTED NOT DETECTED Final   Haemophilus influenzae NOT DETECTED NOT DETECTED Final   Neisseria meningitidis NOT DETECTED NOT DETECTED Final   Pseudomonas aeruginosa NOT DETECTED NOT DETECTED Final   Stenotrophomonas maltophilia NOT DETECTED NOT DETECTED Final   Candida albicans NOT DETECTED NOT DETECTED Final   Candida auris NOT DETECTED NOT DETECTED Final   Candida glabrata NOT DETECTED NOT DETECTED Final   Candida krusei NOT DETECTED NOT DETECTED Final   Candida parapsilosis NOT DETECTED NOT DETECTED Final   Candida tropicalis NOT DETECTED NOT DETECTED Final   Cryptococcus neoformans/gattii NOT DETECTED NOT DETECTED Final   CTX-M ESBL NOT DETECTED NOT DETECTED Final   Carbapenem resistance IMP NOT DETECTED NOT DETECTED Final   Carbapenem resistance KPC NOT DETECTED NOT DETECTED Final   Carbapenem resistance NDM NOT DETECTED NOT DETECTED Final   Carbapenem resist OXA 48 LIKE NOT DETECTED NOT DETECTED Final   Carbapenem resistance VIM NOT DETECTED NOT DETECTED Final    Comment: Performed at [facility], [address] [place], [place] [address]  Resp Panel by RT-PCR (Flu A&B, Covid) Nasopharyngeal Swab     Status: None   Collection Time: 04/22/21  5:07 PM   Specimen: Nasopharyngeal Swab; Nasopharyngeal(NP) swabs in vial transport medium   Result Value Ref Range Status   SARS Coronavirus 2 by RT PCR NEGATIVE NEGATIVE Final    Comment: (NOTE) SARS-CoV-2 target nucleic acids are NOT DETECTED.  The SARS-CoV-2 RNA is generally detectable in upper respiratory specimens during the acute phase of infection. The lowest concentration of SARS-CoV-2 viral copies this assay can detect is 138 copies/mL. A negative result does not preclude SARS-Cov-2 infection and should not be used as the sole basis for treatment or other patient management decisions. A negative result may occur with  improper specimen collection/handling, submission of specimen other than nasopharyngeal swab, presence of viral mutation(s) within the areas targeted by this assay, and inadequate number of viral copies(<138 copies/mL). A negative result must be combined with  clinical observations, patient history, and epidemiological information. The expected result is Negative.  Fact Sheet for Patients:  EntrepreneurPulse.com.au  Fact Sheet for Healthcare Providers:  IncredibleEmployment.be  This test is no t yet approved or cleared by the [place] FDA and  has been authorized for detection and/or diagnosis of SARS-CoV-2 by FDA under an Emergency Use Authorization (EUA). This EUA will remain  in effect (meaning this test can be used) for the duration of the COVID-19 declaration under Section 564(b)(1) of the Act, 21 U.S.C.section 360bbb-3(b)(1), unless the authorization is terminated  or revoked sooner.       Influenza A by PCR NEGATIVE NEGATIVE Final   Influenza B by PCR NEGATIVE NEGATIVE Final    Comment: (NOTE) The Xpert Xpress SARS-CoV-2/FLU/RSV plus assay is intended as an aid in the diagnosis of influenza from Nasopharyngeal swab specimens and should not be used as a sole basis for treatment. Nasal washings and aspirates are unacceptable for Xpert Xpress SARS-CoV-2/FLU/RSV testing.  Fact Sheet for  Patients: EntrepreneurPulse.com.au  Fact Sheet for Healthcare Providers: IncredibleEmployment.be  This test is not yet approved or cleared by the [place] FDA and has been authorized for detection and/or diagnosis of SARS-CoV-2 by FDA under an Emergency Use Authorization (EUA). This EUA will remain in effect (meaning this test can be used) for the duration of the COVID-19 declaration under Section 564(b)(1) of the Act, 21 U.S.C. section 360bbb-3(b)(1), unless the authorization is terminated or revoked.  Performed at [facility], [address], [place], [place] [address]   Surgical PCR screen     Status: None   Collection Time: 04/22/21  5:10 PM   Specimen: Nasal Mucosa; Nasal Swab  Result Value Ref Range Status   MRSA, PCR NEGATIVE NEGATIVE Final   Staphylococcus aureus NEGATIVE NEGATIVE Final    Comment: (NOTE) The Xpert SA Assay (FDA approved for NASAL specimens in patients 71 years of age and older), is one component of a comprehensive surveillance program. It is not intended to diagnose infection nor to guide or monitor treatment. Performed at [facility], [address] [place], [place] [address]   MRSA Next Gen by PCR, Nasal     Status: None   Collection Time: 04/22/21  6:42 PM   Specimen: Nasal Mucosa; Nasal Swab  Result Value Ref Range Status   MRSA by PCR Next Gen NOT DETECTED NOT DETECTED Final    Comment: (NOTE) The GeneXpert MRSA Assay (FDA approved for NASAL specimens only), is one component of a comprehensive MRSA colonization surveillance program. It is not intended to diagnose MRSA infection nor to guide or monitor treatment for MRSA infections. Test performance is not FDA approved in patients less than 5 years old. Performed at [facility], [address] [place], [place] [address]   CULTURE, BLOOD (ROUTINE X 2) w Reflex to ID Panel     Status: None (Preliminary result)    Collection Time: 04/22/21  6:48 PM   Specimen: BLOOD  Result Value Ref Range Status   Specimen Description BLOOD BLOOD RIGHT HAND  Final   Special Requests   Final    BOTTLES DRAWN AEROBIC AND ANAEROBIC Blood Culture adequate volume   Culture   Final    NO GROWTH 2 DAYS Performed at [facility], [address] [place], [place] [address]    Report Status PENDING  Incomplete  CULTURE, BLOOD (ROUTINE X 2) w Reflex to ID Panel     Status: None (Preliminary result)  Collection Time: 04/22/21  7:03 PM   Specimen: BLOOD  Result Value Ref Range Status   Specimen Description BLOOD BLOOD LEFT HAND  Final   Special Requests   Final    BOTTLES DRAWN AEROBIC AND ANAEROBIC Blood Culture adequate volume   Culture   Final    NO GROWTH 2 DAYS Performed at [facility], [address] [place], [place] [address]    Report Status PENDING  Incomplete    Coagulation Studies: No results for input(s): LABPROT, INR in the last 72 hours.  Urinalysis: Recent Labs    04/22/21 0929  COLORURINE YELLOW*  LABSPEC 1.008  PHURINE 6.0  GLUCOSEU NEGATIVE  HGBUR MODERATE*  BILIRUBINUR NEGATIVE  KETONESUR NEGATIVE  PROTEINUR 30*  NITRITE POSITIVE*  LEUKOCYTESUR LARGE*      Imaging: US RENAL  Result Date: 04/23/2021 CLINICAL DATA:  Acute kidney injury EXAM: RENAL / URINARY TRACT ULTRASOUND COMPLETE COMPARISON:  None. FINDINGS: Right Kidney: Renal measurements: 11.8 x 5.9 x 4.3 cm = volume: 155 mL. Echogenicity within normal limits. No mass or hydronephrosis visualized. Right extrarenal pelvis. Left Kidney: Renal measurements: 10.6 x 6 x 5.5 cm = volume: 185 mL. Echogenicity within normal limits. No mass or hydronephrosis visualized. Bladder: No bladder wall thickening. Debris dependently along the bladder base. Other: None. IMPRESSION: 1. Normal renal ultrasound. Electronically Signed   By: Kathreen Devoid M.D.   On: 04/23/2021 10:07     Medications:    aztreonam 1 g (04/24/21 1449)     feeding supplement  237 mL Oral BID BM   heparin  5,000 Units Subcutaneous Q8H   multivitamin with minerals  1 tablet Oral Daily   nicotine  14 mg Transdermal Daily   morphine injection, oxyCODONE, polyethylene glycol  Assessment/ Plan:  Ms. Lynn Norton is a 71 y.o.  female with past medical conditions including arthritis, hematuria, hypertension, OA, RA, hepatitis, and osteoporosis, who was admitted to [facility] on 04/22/2021 for Weakness [R53.1] Fall, initial encounter [W19.XXXA] Elbow fracture, right, closed, initial encounter [S42.401A] Sepsis ([place]) [A41.9] Urinary tract infection without hematuria, site unspecified [N39.0] Sepsis, due to unspecified organism, unspecified whether acute organ dysfunction present ([place]) [A41.9]   Acute kidney injury likely secondary to progression of urinary tract infection.  Normal renal function July 2022.  Renal ultrasound negative for obstruction.  No IV contrast exposure.    Renal function relatively unchanged with adequate urine output.  Continue to encourage oral intake.  1 L normal saline given yesterday.  We will continue to monitor renal function.  Expect improvement with treatment of underlying  UTI.  Lab Results  Component Value Date   CREATININE 2.23 (H) 04/24/2021   CREATININE 2.26 (H) 04/23/2021   CREATININE 2.10 (H) 04/22/2021    Intake/Output Summary (Last 24 hours) at 04/24/2021 1450 Last data filed at 04/24/2021 0609 Gross per 24 hour  Intake 1067.15 ml  Output 1350 ml  Net -282.85 ml   2. Hypertension .  Home medication include amlodipine.  Blood pressure stable.  All medications remain held.   3.  Sepsis secondary to UTI.  UA suggestive of UTI, currently awaiting urine culture.  Patient currently on a aztreonam for bacteremia.  Follow-up cultures ordered    LOS: 2 [place] 3/3/20232:50 PM

## 2021-04-24 NOTE — Care Management Important Message (Signed)
Important Message ? ?Patient Details  ?Name: Lynn Norton ?MRN: [id number] ?Date of Birth: May 20, 1950 ? ? ?Medicare Important Message Given:  N/A - LOS <3 / Initial given by admissions ? ? ? ? ?Juliann Pulse A [name] ?04/24/2021, 9:33 AM ?

## 2021-04-24 NOTE — Assessment & Plan Note (Signed)
Takes methotrexate and prednisone at home.  Currently on hold ?

## 2021-04-24 NOTE — Assessment & Plan Note (Addendum)
Smokes 20 cigarettes a day.  Counseled for cessation.  Offered nicotine patch ?

## 2021-04-24 NOTE — Progress Notes (Signed)
Physical Therapy Treatment ?Patient Details ?Name: Lynn Norton ?MRN: [id number] ?DOB: 1950-03-04 ?Today's Date: 04/24/2021 ? ? ?History of Present Illness 71 y.o. female with history of arthritis who presented from home with complaints of multiple falls over last few days, pain on the right elbow.  Pt with R nondisplaced comminuted fx R trochlea. ? ?  ?PT Comments  ? ? Pt received seated in recliner agreeable to PT. Re-education provided on RUE NWB precautions. Session limited due to poor safety awareness, incorrect quad cane use, and balance deficits along with fear of falling.  Pt requires minA to stand to quad cane with frequent attempts to reach with RUE despite max VC's to prevent. Pt taking small shuffling steps with poor use of quad cane despite education and max VC's for correct use. Pt requesting to sit due to anxiousness and fear of falls. Returned to supine in bed with minA at [employer]. Education provided on RUE positioning in sling and for edema management as R hand is significantly swollen. RUE propped on pillows with all needs in reach. D/c recs remain appropriate due to aformentioned deficits.  ?  ?Recommendations for follow up therapy are one component of a multi-disciplinary discharge planning process, led by the attending physician.  Recommendations may be updated based on patient status, additional functional criteria and insurance authorization. ? ?Follow Up Recommendations ? Skilled nursing-short term rehab (<3 hours/day) ?  ?  ?Assistance Recommended at Discharge Frequent or constant Supervision/Assistance  ?Patient can return home with the following A lot of help with bathing/dressing/bathroom;Assistance with cooking/housework;Help with stairs or ramp for entrance;A lot of help with walking and/or transfers ?  ?Equipment Recommendations ? Other (comment) (tbd by next venue of care)  ?  ?Recommendations for Other Services   ? ? ?  ?Precautions / Restrictions Precautions ?Precautions: Fall ?Required  Braces or Orthoses: Sling ?Restrictions ?Weight Bearing Restrictions: Yes ?RUE Weight Bearing: Non weight bearing ?Other Position/Activity Restrictions: sling to be donned at all times  ?  ? ?Mobility ? Bed Mobility ?Overal bed mobility: Needs Assistance ?Bed Mobility: Sit to Supine ?  ?  ?  ?Sit to supine: Min assist ?  ?General bed mobility comments: In recliner pre session. MinA for [place] to return to supine in bed ?Patient Response: Cooperative ? ?Transfers ?Overall transfer level: Needs assistance ?Equipment used: Quad cane ?Transfers: Sit to/from Stand ?Sit to Stand: Min assist ?  ?  ?  ?  ?  ?  ?  ? ?Ambulation/Gait ?Ambulation/Gait assistance: Min assist ?Gait Distance (Feet): 2 Feet ?Assistive device: Quad cane ?Gait Pattern/deviations: Step-to pattern ?  ?  ?  ?General Gait Details: Continues to want to reach with RUE throughout gait for support. Limited gait due to pt requesting to sit due to fear of falling. ? ? ?Stairs ?  ?  ?  ?  ?  ? ? ?Wheelchair Mobility ?  ? ?Modified Rankin (Stroke Patients Only) ?  ? ? ?  ?Balance Overall balance assessment: Needs assistance ?Sitting-balance support: Single extremity supported (LUE) ?Sitting balance-Leahy Scale: Fair ?  ?  ?Standing balance support: Single extremity supported ?Standing balance-Leahy Scale: Poor ?Standing balance comment: Significant sway in standing with use of quad cane on LUE. ?  ?  ?  ?  ?  ?  ?  ?  ?  ?  ?  ?  ? ?  ?Cognition Arousal/Alertness: Awake/alert ?Behavior During Therapy: [facility] for tasks assessed/performed ?Overall Cognitive Status: No family/caregiver present to determine baseline cognitive functioning ?  ?  ?  ?  ?  ?  ?  ?  ?  ?  ?  ?  ?  ?  ?  ?  ?  General Comments: Remains heavy VC's for maintaining NWB precautions on RUE. ?  ?  ? ?  ?Exercises General Exercises - Lower Extremity ?Long Arc Quad: AROM, Strengthening, Both, 10 reps, Seated ?Hip Flexion/Marching: AROM, Seated, Both, 10 reps ?Toe Raises: AROM, Both, 10 reps,  Seated ?Heel Raises: AROM, Strengthening, Both, 10 reps, Seated ?Other Exercises ?Other Exercises: Educated on proper positioning for edema management. Re-education on RUE precautions. Proper use of quad cane with transfers and ambulation. ? ?  ?General Comments   ?  ?  ? ?Pertinent Vitals/Pain Pain Assessment ?Pain Assessment: Faces ?Faces Pain Scale: Hurts whole lot ?Pain Location: R UE ?Pain Descriptors / Indicators: Discomfort, Aching, Grimacing, Guarding ?Pain Intervention(s): Limited activity within patient's tolerance, Monitored during session, Repositioned  ? ? ?Home Living   ?  ?  ?  ?  ?  ?  ?  ?  ?  ?   ?  ?Prior Function    ?  ?  ?   ? ?PT Goals (current goals can now be found in the care plan section) Acute Rehab PT Goals ?Patient Stated Goal: to go home. ?PT Goal Formulation: With patient ?Time For Goal Achievement: 05/07/21 ?Potential to Achieve Goals: Poor ?Progress towards PT goals: Not progressing toward goals - comment (limited by fear of falling and poor balance with safe comletion of household distances.) ? ?  ?Frequency ? ? ? Min 2X/week ? ? ? ?  ?PT Plan Current plan remains appropriate  ? ? ?Co-evaluation   ?  ?  ?  ?  ? ?  ?AM-PAC PT "6 Clicks" Mobility   ?Outcome Measure ? Help needed turning from your back to your side while in a flat bed without using bedrails?: A Lot ?Help needed moving from lying on your back to sitting on the side of a flat bed without using bedrails?: A Lot ?Help needed moving to and from a bed to a chair (including a wheelchair)?: A Lot ?Help needed standing up from a chair using your arms (e.g., wheelchair or bedside chair)?: A Lot ?Help needed to walk in hospital room?: A Lot ?Help needed climbing 3-5 steps with a railing? : Total ?6 Click Score: 11 ? ?  ?End of Session Equipment Utilized During Treatment: Gait belt ?Activity Tolerance: Other (comment) (limited by fear of falling) ?Patient left: in bed;with call bell/phone within reach;with bed alarm set ?Nurse  Communication: Mobility status ?PT Visit Diagnosis: Unsteadiness on feet (R26.81);Other abnormalities of gait and mobility (R26.89);Muscle weakness (generalized) (M62.81);History of falling (Z91.81);Difficulty in walking, not elsewhere classified (R26.2) ?  ? ? ?Time: 5188-4166 ?PT Time Calculation (min) (ACUTE ONLY): 20 min ? ?Charges:  $Therapeutic Activity: 8-22 mins          ?          ?Salem Caster. Fairly IV, PT, DPT ?Physical Therapist- [place]  ?[facility]  ?04/24/2021, 12:20 PM ? ?

## 2021-04-25 DIAGNOSIS — B9620 Unspecified Escherichia coli [E. coli] as the cause of diseases classified elsewhere: Secondary | ICD-10-CM

## 2021-04-25 DIAGNOSIS — S42401A Unspecified fracture of lower end of right humerus, initial encounter for closed fracture: Secondary | ICD-10-CM

## 2021-04-25 DIAGNOSIS — N179 Acute kidney failure, unspecified: Secondary | ICD-10-CM

## 2021-04-25 DIAGNOSIS — R7881 Bacteremia: Secondary | ICD-10-CM

## 2021-04-25 DIAGNOSIS — A419 Sepsis, unspecified organism: Secondary | ICD-10-CM | POA: Diagnosis not present

## 2021-04-25 LAB — CBC
HCT: 35.7 % — ABNORMAL LOW (ref 36.0–46.0)
Hemoglobin: 12.4 g/dL (ref 12.0–15.0)
MCH: 34.6 pg — ABNORMAL HIGH (ref 26.0–34.0)
MCHC: 34.7 g/dL (ref 30.0–36.0)
MCV: 99.7 fL (ref 80.0–100.0)
Platelets: 447 10*3/uL — ABNORMAL HIGH (ref 150–400)
RBC: 3.58 MIL/uL — ABNORMAL LOW (ref 3.87–5.11)
RDW: 14.4 % (ref 11.5–15.5)
WBC: 22.9 10*3/uL — ABNORMAL HIGH (ref 4.0–10.5)
nRBC: 0 % (ref 0.0–0.2)

## 2021-04-25 LAB — BASIC METABOLIC PANEL
Anion gap: 7 (ref 5–15)
BUN: 41 mg/dL — ABNORMAL HIGH (ref 8–23)
CO2: 24 mmol/L (ref 22–32)
Calcium: 7.8 mg/dL — ABNORMAL LOW (ref 8.9–10.3)
Chloride: 105 mmol/L (ref 98–111)
Creatinine, Ser: 2.17 mg/dL — ABNORMAL HIGH (ref 0.44–1.00)
GFR, Estimated: 23 mL/min — ABNORMAL LOW (ref >60)
Glucose, Bld: 109 mg/dL — ABNORMAL HIGH (ref 70–99)
Potassium: 5 mmol/L (ref 3.5–5.1)
Sodium: 136 mmol/L (ref 135–145)

## 2021-04-25 LAB — URINE CULTURE: Culture: >=100000 — AB

## 2021-04-25 NOTE — Hospital Course (Signed)
Lynn Norton is a 71 y.o. female with history of rheumatoid arthritis who presented from home with complaints of multiple falls over last few days prior to admission and pain in the right elbow. Patient lives with her husband in a house.  She ambulates with help of a cane.   ?She had become progressively more weak and unsteady resulting in falls at home. ?X-ray on admission showed a right nondisplaced mildly comminuted fracture through the right trochlea. ?Orthopedic surgery was consulted and she was placed in a sling with plans for operative repair. ?Her work-up was also notable for evidence of a UTI with complication of bacteremia as well.  Surgery was delayed while infection was treated.  She also developed an AKI for which nephrology was also consulted. ?

## 2021-04-25 NOTE — Progress Notes (Signed)
?Progress Note ? ? ?Patient: Lynn Norton YCX:[id number] DOB: 1950/06/16 DOA: 04/22/2021     3 ?DOS: the patient was seen and examined on 04/25/2021 ?  ?Brief hospital course: ?Lynn Norton is a 71 y.o. female with history of rheumatoid arthritis who presented from home with complaints of multiple falls over last few days prior to admission and pain in the right elbow. Patient lives with her husband in a house.  She ambulates with help of a cane.   ?She had become progressively more weak and unsteady resulting in falls at home. ?X-ray on admission showed a right nondisplaced mildly comminuted fracture through the right trochlea. ?Orthopedic surgery was consulted and she was placed in a sling with plans for operative repair. ?Her work-up was also notable for evidence of a UTI with complication of bacteremia as well.  Surgery was delayed while infection was treated.  She also developed an AKI for which nephrology was also consulted. ? ?Assessment and Plan: ?* Sepsis secondary to UTI [facility]) ?Presented with elevated leukocytes, elevated procalcitonin, AKI.  Source is UTI.   ?-Continue aztreonam ? ?Closed fracture dislocation of right elbow ?Presented with fall from home.  Complained of right elbow pain on presentation.  X-ray of the right elbow showed nondisplaced mildly comminuted fracture of right trochlea.  Currently in a sling.  Orthopedics consulted and following.   ?-Tentative plan is for ORIF on Monday ? ?Bacteremia due to Escherichia coli ?- Suspected translocation from urinary source.  Blood cultures noted with E. coli ?- Continue aztreonam ?- Repeat cultures have been negative ? ?Frequent falls ?Falling several times at home.  History of arthritis.  Ambulates with the help of cane.  Lives with husband.  PT/OT consulted, recommended skilled nursing facility on discharge ? ?AKI (acute kidney injury) ([place]) ?- No known history of CKD ?- Renal ultrasound negative for hydronephrosis or obstruction ?- Nephrology  following ?- Continue fluids ?- BMP daily ? ?Rheumatoid arthritis ([place]) ?Takes methotrexate and prednisone at home.  Currently on hold ? ?Tobacco use ?Smokes 20 cigarettes a day.  Counseled for cessation.  Offered nicotine patch ? ?Hyponatremia ?Thought to be secondary to hypovolemic hyponatremia.  Improving .Marland Kitchen  Monitor BMP ? ? ? ? ?Subjective: No events overnight. Resting comfortably in bed. She understands plan is for surgery on Monday. ? ?Physical Exam: ?Vitals:  ? 04/24/21 2346 04/25/21 0357 04/25/21 0800 04/25/21 1119  ?BP: (!) 154/71 (!) 144/63 (!) 150/68 (!) 154/59  ?Pulse: 97 94 100 94  ?Resp: 15 14 13 18   ?Temp: 97.9 ?F (36.6 ?C) 98.3 ?F (36.8 ?C)  (!) 97.5 ?F (36.4 ?C)  ?TempSrc:      ?SpO2: 92% 97% 93% 99%  ?Weight:      ?Height:      ? ?Physical Exam ?Constitutional:   ?   Appearance: Normal appearance.  ?HENT:  ?   Head: Normocephalic and atraumatic.  ?   Mouth/Throat:  ?   Mouth: Mucous membranes are moist.  ?Eyes:  ?   Extraocular Movements: Extraocular movements intact.  ?Cardiovascular:  ?   Rate and Rhythm: Normal rate and regular rhythm.  ?Pulmonary:  ?   Effort: Pulmonary effort is normal.  ?   Breath sounds: Normal breath sounds.  ?Abdominal:  ?   General: Bowel sounds are normal. There is no distension.  ?   Palpations: Abdomen is soft.  ?   Tenderness: There is no abdominal tenderness.  ?Musculoskeletal:  ?   Cervical back: Normal range of motion  and neck supple.  ?   Comments: Right upper extremity in sling with dressing noted underneath.  Right hand is edematous approximately 3+  ?Skin: ?   General: Skin is warm and dry.  ?Neurological:  ?   General: No focal deficit present.  ?   Mental Status: She is alert.  ?Psychiatric:     ?   Mood and Affect: Mood normal.     ?   Behavior: Behavior normal.  ? ? ? ?Data Reviewed: ? ?Results for orders placed or performed during the hospital encounter of 04/22/21 (from the past 24 hour(s))  ?Basic metabolic panel     Status: Abnormal  ? Collection Time:  04/25/21  8:34 AM  ?Result Value Ref Range  ? Sodium 136 135 - 145 mmol/L  ? Potassium 5.0 3.5 - 5.1 mmol/L  ? Chloride 105 98 - 111 mmol/L  ? CO2 24 22 - 32 mmol/L  ? Glucose, Bld 109 (H) 70 - 99 mg/dL  ? BUN 41 (H) 8 - 23 mg/dL  ? Creatinine, Ser 2.17 (H) 0.44 - 1.00 mg/dL  ? Calcium 7.8 (L) 8.9 - 10.3 mg/dL  ? GFR, Estimated 23 (L) >60 mL/min  ? Anion gap 7 5 - 15  ?CBC     Status: Abnormal  ? Collection Time: 04/25/21  8:34 AM  ?Result Value Ref Range  ? WBC 22.9 (H) 4.0 - 10.5 K/uL  ? RBC 3.58 (L) 3.87 - 5.11 MIL/uL  ? Hemoglobin 12.4 12.0 - 15.0 g/dL  ? HCT 35.7 (L) 36.0 - 46.0 %  ? MCV 99.7 80.0 - 100.0 fL  ? MCH 34.6 (H) 26.0 - 34.0 pg  ? MCHC 34.7 30.0 - 36.0 g/dL  ? RDW 14.4 11.5 - 15.5 %  ? Platelets 447 (H) 150 - 400 K/uL  ? nRBC 0.0 0.0 - 0.2 %  ?  ?I have Reviewed nursing notes, Vitals, and Lab results since pt's last encounter. Pertinent lab results : see above ?I have ordered test including BMP, CBC, Mg ?I have reviewed the last note from staff over past 24 hours ?I have discussed pt's care plan and test results with nursing staff, case manager ? ? ?Family Communication:  ? ?Disposition: ?Status is: Inpatient ?Remains inpatient appropriate because: Treatment as outlined in A&P ? ? Planned Discharge Destination: Skilled nursing facility ? ?Antimicrobials: ?Aztreonam 04/23/2021 >> current ? ?Consultants: ?Orthopedic surgery ?Nephrology ? ?Procedures:  ? ? ?DVT ppx:  ?heparin injection 5,000 Units Start: 04/22/21 1400 ? ? ?  Code Status: Full Code  ? ? ?Author: ?Dwyane Dee, MD ?04/25/2021 12:45 PM ? ?For on call review [email or web address].  ? ?

## 2021-04-25 NOTE — Progress Notes (Signed)
[facility]  ROUNDING NOTE   Subjective:   Patient resting comfortably  Objective:  Vital signs in last 24 hours:  Temp:  [97.5 F (36.4 C)-98.3 F (36.8 C)] 97.5 F (36.4 C) (03/04 1119) Pulse Rate:  [94-102] 94 (03/04 1119) Resp:  [13-18] 18 (03/04 1119) BP: (131-161)/(59-83) 154/59 (03/04 1119) SpO2:  [88 %-99 %] 99 % (03/04 1119)  Weight change:  Filed Weights   04/22/21 1100 04/22/21 1900  Weight: 45.8 kg 46.1 kg    Intake/Output: I/O last 3 completed shifts: In: 1487.2 [P.O.:120; I.V.:867.2; IV Piggyback:500] Out: 1750 [Urine:1750]   Intake/Output this shift:  Total I/O In: 120 [P.O.:120] Out: 725 [Urine:725]  Physical Exam: General: NAD, sitting in chair  Head: Normocephalic, atraumatic. Moist oral mucosal membranes  Eyes: Anicteric  Lungs:  Clear to auscultation, normal effort  Heart: Regular rate and rhythm  Abdomen:  Soft, nontender  Extremities: No peripheral edema.  Neurologic: Nonfocal, moving all four extremities  Skin: No lesions       Basic Metabolic Panel: Recent Labs  Lab 04/22/21 0734 04/23/21 0324 04/24/21 0553 04/25/21 0834  NA 130* 132* 134* 136  K 4.4 4.9 4.9 5.0  CL 97* 100 107 105  CO2 24 22 21* 24  GLUCOSE 129* 107* 96 109*  BUN 34* 37* 41* 41*  CREATININE 2.10* 2.26* 2.23* 2.17*  CALCIUM 7.3* 7.1* 7.0* 7.8*    Liver Function Tests: Recent Labs  Lab 04/22/21 0734  AST 25  ALT 30  ALKPHOS 102  BILITOT 0.8  PROT 6.1*  ALBUMIN 2.6*   No results for input(s): LIPASE, AMYLASE in the last 168 hours. No results for input(s): AMMONIA in the last 168 hours.  CBC: Recent Labs  Lab 04/22/21 0734 04/23/21 0324 04/24/21 0553 04/25/21 0834  WBC 30.7* 25.1* 24.2* 22.9*  NEUTROABS 25.9*  --  20.8*  --   HGB 12.5 10.9* 11.0* 12.4  HCT 35.6* 31.2* 32.4* 35.7*  MCV 98.3 98.7 100.6* 99.7  PLT 253 296 352 447*    Cardiac Enzymes: Recent Labs  Lab 04/22/21 0734  CKTOTAL 124    BNP: Invalid input(s):  POCBNP  CBG: No results for input(s): GLUCAP in the last 168 hours.  Microbiology: Results for orders placed or performed during the hospital encounter of 04/22/21  Urine Culture     Status: Abnormal   Collection Time: 04/22/21  9:29 AM   Specimen: Urine, Clean Catch  Result Value Ref Range Status   Specimen Description   Final    URINE, CLEAN CATCH Performed at [facility], [address], [place], [place] [address]    Special Requests   Final    NONE Performed at [facility], [address] [place], [place] [address]    Culture >=100,000 COLONIES/mL ESCHERICHIA COLI (A)  Final   Report Status 04/25/2021 FINAL  Final   Organism ID, Bacteria ESCHERICHIA COLI (A)  Final      Susceptibility   Escherichia coli - MIC*    AMPICILLIN >=32 RESISTANT Resistant     CEFAZOLIN <=4 SENSITIVE Sensitive     CEFEPIME <=0.12 SENSITIVE Sensitive     CEFTRIAXONE <=0.25 SENSITIVE Sensitive     CIPROFLOXACIN <=0.25 SENSITIVE Sensitive     GENTAMICIN <=1 SENSITIVE Sensitive     IMIPENEM <=0.25 SENSITIVE Sensitive     NITROFURANTOIN <=16 SENSITIVE Sensitive     TRIMETH/SULFA <=20 SENSITIVE Sensitive     AMPICILLIN/SULBACTAM 16 INTERMEDIATE Intermediate     PIP/TAZO <=4 SENSITIVE Sensitive     * >=  100,000 COLONIES/mL ESCHERICHIA COLI  Culture, blood (single)     Status: Abnormal (Preliminary result)   Collection Time: 04/22/21 10:18 AM   Specimen: BLOOD  Result Value Ref Range Status   Specimen Description   Final    BLOOD LEFT AC Performed at [facility], [address], [place], [place] [address]    Special Requests   Final    BOTTLES DRAWN AEROBIC AND ANAEROBIC Blood Culture adequate volume Performed at [facility], [address] [place], [place] [address]    Culture  Setup Time   Final    GRAM NEGATIVE RODS IN BOTH AEROBIC AND ANAEROBIC BOTTLES CRITICAL RESULT CALLED TO, READ BACK BY AND VERIFIED WITH: JASON ROBBINS @ 1194 ON  04/23/2021.Marland KitchenMarland KitchenTKR GRAM POSITIVE COCCI ANAEROBIC BOTTLE ONLY CRITICAL RESULT CALLED TO, READ BACK BY AND VERIFIED WITH: D,MITCHELL PHARMD@1048  04/23/21 EB    Culture (A)  Final    ESCHERICHIA COLI GRAM POSITIVE COCCI CULTURE REINCUBATED FOR BETTER GROWTH Performed at [facility], [place] [address] [place], [place] [address]    Report Status PENDING  Incomplete   Organism ID, Bacteria ESCHERICHIA COLI  Final      Susceptibility   Escherichia coli - MIC*    AMPICILLIN >=32 RESISTANT Resistant     CEFAZOLIN <=4 SENSITIVE Sensitive     CEFEPIME <=0.12 SENSITIVE Sensitive     CEFTAZIDIME <=1 SENSITIVE Sensitive     CEFTRIAXONE <=0.25 SENSITIVE Sensitive     CIPROFLOXACIN <=0.25 SENSITIVE Sensitive     GENTAMICIN <=1 SENSITIVE Sensitive     IMIPENEM 0.5 SENSITIVE Sensitive     TRIMETH/SULFA <=20 SENSITIVE Sensitive     AMPICILLIN/SULBACTAM 8 SENSITIVE Sensitive     PIP/TAZO <=4 SENSITIVE Sensitive     * ESCHERICHIA COLI  Blood Culture ID Panel (Reflexed)     Status: Abnormal   Collection Time: 04/22/21 10:18 AM  Result Value Ref Range Status   Enterococcus faecalis NOT DETECTED NOT DETECTED Final   Enterococcus Faecium NOT DETECTED NOT DETECTED Final   Listeria monocytogenes NOT DETECTED NOT DETECTED Final   Staphylococcus species NOT DETECTED NOT DETECTED Final   Staphylococcus aureus (BCID) NOT DETECTED NOT DETECTED Final   Staphylococcus epidermidis NOT DETECTED NOT DETECTED Final   Staphylococcus lugdunensis NOT DETECTED NOT DETECTED Final   Streptococcus species NOT DETECTED NOT DETECTED Final   Streptococcus agalactiae NOT DETECTED NOT DETECTED Final   Streptococcus pneumoniae NOT DETECTED NOT DETECTED Final   Streptococcus pyogenes NOT DETECTED NOT DETECTED Final   A.calcoaceticus-baumannii NOT DETECTED NOT DETECTED Final   Bacteroides fragilis NOT DETECTED NOT DETECTED Final   Enterobacterales DETECTED (A) NOT DETECTED Final    Comment: Enterobacterales represent a large  order of gram negative bacteria, not a single organism. CRITICAL RESULT CALLED TO, READ BACK BY AND VERIFIED WITH: JASON ROBBINS @ 1448 ON 04/23/2021.Marland KitchenMarland KitchenTKR    Enterobacter cloacae complex NOT DETECTED NOT DETECTED Final   Escherichia coli DETECTED (A) NOT DETECTED Final    Comment: CRITICAL RESULT CALLED TO, READ BACK BY AND VERIFIED WITH: JASON ROBBINS @ 1856 ON 04/23/2021.Marland KitchenMarland KitchenTKR    Klebsiella aerogenes NOT DETECTED NOT DETECTED Final   Klebsiella oxytoca NOT DETECTED NOT DETECTED Final   Klebsiella pneumoniae NOT DETECTED NOT DETECTED Final   Proteus species NOT DETECTED NOT DETECTED Final   Salmonella species NOT DETECTED NOT DETECTED Final   Serratia marcescens NOT DETECTED NOT DETECTED Final   Haemophilus influenzae NOT DETECTED NOT DETECTED Final   Neisseria meningitidis NOT DETECTED NOT  DETECTED Final   Pseudomonas aeruginosa NOT DETECTED NOT DETECTED Final   Stenotrophomonas maltophilia NOT DETECTED NOT DETECTED Final   Candida albicans NOT DETECTED NOT DETECTED Final   Candida auris NOT DETECTED NOT DETECTED Final   Candida glabrata NOT DETECTED NOT DETECTED Final   Candida krusei NOT DETECTED NOT DETECTED Final   Candida parapsilosis NOT DETECTED NOT DETECTED Final   Candida tropicalis NOT DETECTED NOT DETECTED Final   Cryptococcus neoformans/gattii NOT DETECTED NOT DETECTED Final   CTX-M ESBL NOT DETECTED NOT DETECTED Final   Carbapenem resistance IMP NOT DETECTED NOT DETECTED Final   Carbapenem resistance KPC NOT DETECTED NOT DETECTED Final   Carbapenem resistance NDM NOT DETECTED NOT DETECTED Final   Carbapenem resist OXA 48 LIKE NOT DETECTED NOT DETECTED Final   Carbapenem resistance VIM NOT DETECTED NOT DETECTED Final    Comment: Performed at [facility], [address] [place], [place] [address]  Blood Culture ID Panel (Reflexed)     Status: Abnormal   Collection Time: 04/22/21 10:18 AM  Result Value Ref Range Status   Enterococcus faecalis NOT DETECTED NOT  DETECTED Final   Enterococcus Faecium NOT DETECTED NOT DETECTED Final   Listeria monocytogenes NOT DETECTED NOT DETECTED Final   Staphylococcus species DETECTED (A) NOT DETECTED Final    Comment: CRITICAL RESULT CALLED TO, READ BACK BY AND VERIFIED WITH: D,MITCHELL PHARMD@1048  04/23/21 EB    Staphylococcus aureus (BCID) NOT DETECTED NOT DETECTED Final   Staphylococcus epidermidis NOT DETECTED NOT DETECTED Final   Staphylococcus lugdunensis NOT DETECTED NOT DETECTED Final   Streptococcus species NOT DETECTED NOT DETECTED Final   Streptococcus agalactiae NOT DETECTED NOT DETECTED Final   Streptococcus pneumoniae NOT DETECTED NOT DETECTED Final   Streptococcus pyogenes NOT DETECTED NOT DETECTED Final   A.calcoaceticus-baumannii NOT DETECTED NOT DETECTED Final   Bacteroides fragilis NOT DETECTED NOT DETECTED Final   Enterobacterales DETECTED (A) NOT DETECTED Final    Comment: Enterobacterales represent a large order of gram negative bacteria, not a single organism. CRITICAL RESULT CALLED TO, READ BACK BY AND VERIFIED WITH: D,MITCHELL PHARMD@1048  04/23/21 EB    Enterobacter cloacae complex NOT DETECTED NOT DETECTED Final   Escherichia coli DETECTED (A) NOT DETECTED Final    Comment: CRITICAL RESULT CALLED TO, READ BACK BY AND VERIFIED WITH: D,MITCHELL PHARMD@1048  04/23/21 EB    Klebsiella aerogenes NOT DETECTED NOT DETECTED Final   Klebsiella oxytoca NOT DETECTED NOT DETECTED Final   Klebsiella pneumoniae NOT DETECTED NOT DETECTED Final   Proteus species NOT DETECTED NOT DETECTED Final   Salmonella species NOT DETECTED NOT DETECTED Final   Serratia marcescens NOT DETECTED NOT DETECTED Final   Haemophilus influenzae NOT DETECTED NOT DETECTED Final   Neisseria meningitidis NOT DETECTED NOT DETECTED Final   Pseudomonas aeruginosa NOT DETECTED NOT DETECTED Final   Stenotrophomonas maltophilia NOT DETECTED NOT DETECTED Final   Candida albicans NOT DETECTED NOT DETECTED Final   Candida auris NOT  DETECTED NOT DETECTED Final   Candida glabrata NOT DETECTED NOT DETECTED Final   Candida krusei NOT DETECTED NOT DETECTED Final   Candida parapsilosis NOT DETECTED NOT DETECTED Final   Candida tropicalis NOT DETECTED NOT DETECTED Final   Cryptococcus neoformans/gattii NOT DETECTED NOT DETECTED Final   CTX-M ESBL NOT DETECTED NOT DETECTED Final   Carbapenem resistance IMP NOT DETECTED NOT DETECTED Final   Carbapenem resistance KPC NOT DETECTED NOT DETECTED Final   Carbapenem resistance NDM NOT DETECTED NOT DETECTED Final   Carbapenem resist OXA 48 LIKE  NOT DETECTED NOT DETECTED Final   Carbapenem resistance VIM NOT DETECTED NOT DETECTED Final    Comment: Performed at [facility], [place] [address], [place], [place] [address]  Resp Panel by RT-PCR (Flu A&B, Covid) Nasopharyngeal Swab     Status: None   Collection Time: 04/22/21  5:07 PM   Specimen: Nasopharyngeal Swab; Nasopharyngeal(NP) swabs in vial transport medium  Result Value Ref Range Status   SARS Coronavirus 2 by RT PCR NEGATIVE NEGATIVE Final    Comment: (NOTE) SARS-CoV-2 target nucleic acids are NOT DETECTED.  The SARS-CoV-2 RNA is generally detectable in upper respiratory specimens during the acute phase of infection. The lowest concentration of SARS-CoV-2 viral copies this assay can detect is 138 copies/mL. A negative result does not preclude SARS-Cov-2 infection and should not be used as the sole basis for treatment or other patient management decisions. A negative result may occur with  improper specimen collection/handling, submission of specimen other than nasopharyngeal swab, presence of viral mutation(s) within the areas targeted by this assay, and inadequate number of viral copies(<138 copies/mL). A negative result must be combined with clinical observations, patient history, and epidemiological information. The expected result is Negative.  Fact Sheet for Patients:   EntrepreneurPulse.com.au  Fact Sheet for Healthcare Providers:  IncredibleEmployment.be  This test is no t yet approved or cleared by the [place] FDA and  has been authorized for detection and/or diagnosis of SARS-CoV-2 by FDA under an Emergency Use Authorization (EUA). This EUA will remain  in effect (meaning this test can be used) for the duration of the COVID-19 declaration under Section 564(b)(1) of the Act, 21 U.S.C.section 360bbb-3(b)(1), unless the authorization is terminated  or revoked sooner.       Influenza A by PCR NEGATIVE NEGATIVE Final   Influenza B by PCR NEGATIVE NEGATIVE Final    Comment: (NOTE) The Xpert Xpress SARS-CoV-2/FLU/RSV plus assay is intended as an aid in the diagnosis of influenza from Nasopharyngeal swab specimens and should not be used as a sole basis for treatment. Nasal washings and aspirates are unacceptable for Xpert Xpress SARS-CoV-2/FLU/RSV testing.  Fact Sheet for Patients: EntrepreneurPulse.com.au  Fact Sheet for Healthcare Providers: IncredibleEmployment.be  This test is not yet approved or cleared by the [place] FDA and has been authorized for detection and/or diagnosis of SARS-CoV-2 by FDA under an Emergency Use Authorization (EUA). This EUA will remain in effect (meaning this test can be used) for the duration of the COVID-19 declaration under Section 564(b)(1) of the Act, 21 U.S.C. section 360bbb-3(b)(1), unless the authorization is terminated or revoked.  Performed at [facility], [address], [place], [place] [address]   Surgical PCR screen     Status: None   Collection Time: 04/22/21  5:10 PM   Specimen: Nasal Mucosa; Nasal Swab  Result Value Ref Range Status   MRSA, PCR NEGATIVE NEGATIVE Final   Staphylococcus aureus NEGATIVE NEGATIVE Final    Comment: (NOTE) The Xpert SA Assay (FDA approved for NASAL specimens in patients  4 years of age and older), is one component of a comprehensive surveillance program. It is not intended to diagnose infection nor to guide or monitor treatment. Performed at [facility], [address] [place], [place] [address]   MRSA Next Gen by PCR, Nasal     Status: None   Collection Time: 04/22/21  6:42 PM   Specimen: Nasal Mucosa; Nasal Swab  Result Value Ref Range Status   MRSA by PCR Next Gen  NOT DETECTED NOT DETECTED Final    Comment: (NOTE) The GeneXpert MRSA Assay (FDA approved for NASAL specimens only), is one component of a comprehensive MRSA colonization surveillance program. It is not intended to diagnose MRSA infection nor to guide or monitor treatment for MRSA infections. Test performance is not FDA approved in patients less than 68 years old. Performed at [facility], [address] [place], [place] [address]   CULTURE, BLOOD (ROUTINE X 2) w Reflex to ID Panel     Status: None (Preliminary result)   Collection Time: 04/22/21  6:48 PM   Specimen: BLOOD  Result Value Ref Range Status   Specimen Description BLOOD BLOOD RIGHT HAND  Final   Special Requests   Final    BOTTLES DRAWN AEROBIC AND ANAEROBIC Blood Culture adequate volume   Culture   Final    NO GROWTH 3 DAYS Performed at [facility], [address] [place], [place] [address]    Report Status PENDING  Incomplete  CULTURE, BLOOD (ROUTINE X 2) w Reflex to ID Panel     Status: None (Preliminary result)   Collection Time: 04/22/21  7:03 PM   Specimen: BLOOD  Result Value Ref Range Status   Specimen Description BLOOD BLOOD LEFT HAND  Final   Special Requests   Final    BOTTLES DRAWN AEROBIC AND ANAEROBIC Blood Culture adequate volume   Culture   Final    NO GROWTH 3 DAYS Performed at [facility], [address] [place], [place] [address]    Report Status PENDING  Incomplete    Coagulation Studies: No results for input(s): LABPROT, INR in the last 72  hours.  Urinalysis: No results for input(s): COLORURINE, LABSPEC, PHURINE, GLUCOSEU, HGBUR, BILIRUBINUR, KETONESUR, PROTEINUR, UROBILINOGEN, NITRITE, LEUKOCYTESUR in the last 72 hours.  Invalid input(s): APPERANCEUR     Imaging: No results found.   Medications:    aztreonam 1 g (04/25/21 0551)    feeding supplement  237 mL Oral BID BM   heparin  5,000 Units Subcutaneous Q8H   multivitamin with minerals  1 tablet Oral Daily   nicotine  14 mg Transdermal Daily   traZODone  50 mg Oral QHS   morphine injection, oxyCODONE, polyethylene glycol  Assessment/ Plan:  Ms. Lynn Norton is a 71 y.o.  female with past medical conditions including arthritis, hematuria, hypertension, OA, RA, hepatitis, and osteoporosis, who was admitted to [facility] on 04/22/2021 for Weakness [R53.1] Fall, initial encounter [W19.XXXA] Elbow fracture, right, closed, initial encounter [S42.401A] Sepsis ([place]) [A41.9] Urinary tract infection without hematuria, site unspecified [N39.0] Sepsis, due to unspecified organism, unspecified whether acute organ dysfunction present ([place]) [A41.9]   Acute kidney injury likely secondary to progression of urinary tract infection.  Normal renal function July 2022.  Renal ultrasound negative for obstruction.  No IV contrast exposure.    Renal function relatively unchanged with adequate urine output.             Continue to encourage oral intake.  1             We will continue to monitor renal function.    Lab Results  Component Value Date   CREATININE 2.17 (H) 04/25/2021   CREATININE 2.23 (H) 04/24/2021   CREATININE 2.26 (H) 04/23/2021    Intake/Output Summary (Last 24 hours) at 04/25/2021 1445 Last data filed at 04/25/2021 1241 Gross per 24 hour  Intake 540 ml  Output 1425 ml  Net -885 ml   2. Hypertension .  Home medication include amlodipine.  Blood pressure stable.  All medications remain held.   3.  Sepsis secondary to UTI.  UA suggestive of UTI, currently awaiting  urine culture.  Patient currently on a aztreonam for bacteremia.  Follow-up cultures ordered  4. Anemia of chronic disease Patient hemoglobin is stable  5.Hyponatremia Patient sodium is improving   LOS: 3 Lynn Norton s Haruo Stepanek 3/4/20232:45 PM

## 2021-04-26 DIAGNOSIS — I10 Essential (primary) hypertension: Secondary | ICD-10-CM

## 2021-04-26 DIAGNOSIS — N179 Acute kidney failure, unspecified: Secondary | ICD-10-CM | POA: Diagnosis not present

## 2021-04-26 DIAGNOSIS — R7881 Bacteremia: Secondary | ICD-10-CM | POA: Diagnosis not present

## 2021-04-26 DIAGNOSIS — A419 Sepsis, unspecified organism: Secondary | ICD-10-CM | POA: Diagnosis not present

## 2021-04-26 DIAGNOSIS — S42401A Unspecified fracture of lower end of right humerus, initial encounter for closed fracture: Secondary | ICD-10-CM | POA: Diagnosis not present

## 2021-04-26 LAB — BASIC METABOLIC PANEL
Anion gap: 7 (ref 5–15)
BUN: 38 mg/dL — ABNORMAL HIGH (ref 8–23)
CO2: 20 mmol/L — ABNORMAL LOW (ref 22–32)
Calcium: 7.2 mg/dL — ABNORMAL LOW (ref 8.9–10.3)
Chloride: 109 mmol/L (ref 98–111)
Creatinine, Ser: 1.85 mg/dL — ABNORMAL HIGH (ref 0.44–1.00)
GFR, Estimated: 28 mL/min — ABNORMAL LOW (ref >60)
Glucose, Bld: 80 mg/dL (ref 70–99)
Potassium: 4.7 mmol/L (ref 3.5–5.1)
Sodium: 136 mmol/L (ref 135–145)

## 2021-04-26 LAB — CBC WITH DIFFERENTIAL/PLATELET
Abs Immature Granulocytes: 0.15 10*3/uL — ABNORMAL HIGH (ref 0.00–0.07)
Basophils Absolute: 0.1 10*3/uL (ref 0.0–0.1)
Basophils Relative: 1 %
Eosinophils Absolute: 0.2 10*3/uL (ref 0.0–0.5)
Eosinophils Relative: 1 %
HCT: 32.4 % — ABNORMAL LOW (ref 36.0–46.0)
Hemoglobin: 11.3 g/dL — ABNORMAL LOW (ref 12.0–15.0)
Immature Granulocytes: 1 %
Lymphocytes Relative: 4 %
Lymphs Abs: 0.7 10*3/uL (ref 0.7–4.0)
MCH: 34.2 pg — ABNORMAL HIGH (ref 26.0–34.0)
MCHC: 34.9 g/dL (ref 30.0–36.0)
MCV: 98.2 fL (ref 80.0–100.0)
Monocytes Absolute: 1.9 10*3/uL — ABNORMAL HIGH (ref 0.1–1.0)
Monocytes Relative: 11 %
Neutro Abs: 13.6 10*3/uL — ABNORMAL HIGH (ref 1.7–7.7)
Neutrophils Relative %: 82 %
Platelets: 463 10*3/uL — ABNORMAL HIGH (ref 150–400)
RBC: 3.3 MIL/uL — ABNORMAL LOW (ref 3.87–5.11)
RDW: 14.4 % (ref 11.5–15.5)
WBC: 16.6 10*3/uL — ABNORMAL HIGH (ref 4.0–10.5)
nRBC: 0 % (ref 0.0–0.2)

## 2021-04-26 LAB — CULTURE, BLOOD (SINGLE): Special Requests: ADEQUATE

## 2021-04-26 LAB — MAGNESIUM: Magnesium: 1.8 mg/dL (ref 1.7–2.4)

## 2021-04-26 MED ORDER — CEPHALEXIN 500 MG PO CAPS
500.0000 mg | ORAL_CAPSULE | Freq: Two times a day (BID) | ORAL | Status: DC
  Administered 2021-04-26 – 2021-04-29 (×6): 500 mg via ORAL
  Filled 2021-04-26 (×6): qty 1

## 2021-04-26 MED ORDER — AMLODIPINE BESYLATE 5 MG PO TABS
5.0000 mg | ORAL_TABLET | Freq: Every day | ORAL | Status: DC
  Administered 2021-04-26 – 2021-04-29 (×4): 5 mg via ORAL
  Filled 2021-04-26 (×4): qty 1

## 2021-04-26 MED ORDER — LABETALOL HCL 5 MG/ML IV SOLN
10.0000 mg | Freq: Once | INTRAVENOUS | Status: AC
  Administered 2021-04-26: 10 mg via INTRAVENOUS
  Filled 2021-04-26: qty 4

## 2021-04-26 NOTE — Progress Notes (Signed)
[facility]  ROUNDING NOTE   Subjective:   Patient seen sitting up in bed, drowsy but able to answer simple questions Tolerating small meals Denies shortness of breath Patient seen later in morning requesting assistance with breakfast tray, at bedside.  Creatinine 1.85 Urine output 1.7 L in 24 hours  Objective:  Vital signs in last 24 hours:  Temp:  [97.5 F (36.4 C)-98.5 F (36.9 C)] 98.5 F (36.9 C) (03/05 0816) Pulse Rate:  [87-105] 87 (03/05 0816) Resp:  [16-20] 16 (03/05 0816) BP: (151-171)/(59-91) 151/67 (03/05 0816) SpO2:  [90 %-99 %] 97 % (03/05 0816)  Weight change:  Filed Weights   04/22/21 1100 04/22/21 1900  Weight: 45.8 kg 46.1 kg    Intake/Output: I/O last 3 completed shifts: In: 320 [P.O.:120; IV Piggyback:200] Out: 1740 [Urine:2475]   Intake/Output this shift:  Total I/O In: -  Out: 650 [Urine:650]  Physical Exam: General: NAD  Head: Normocephalic, atraumatic. Moist oral mucosal membranes  Eyes: Anicteric  Lungs:  Clear to auscultation, normal effort  Heart: Regular rate and rhythm  Abdomen:  Soft, nontender  Extremities: No peripheral edema.  Neurologic: Nonfocal, moving all four extremities  Skin: No lesions       Basic Metabolic Panel: Recent Labs  Lab 04/22/21 0734 04/23/21 0324 04/24/21 0553 04/25/21 0834 04/26/21 0802  NA 130* 132* 134* 136 136  K 4.4 4.9 4.9 5.0 4.7  CL 97* 100 107 105 109  CO2 24 22 21* 24 20*  GLUCOSE 129* 107* 96 109* 80  BUN 34* 37* 41* 41* 38*  CREATININE 2.10* 2.26* 2.23* 2.17* 1.85*  CALCIUM 7.3* 7.1* 7.0* 7.8* 7.2*  MG  --   --   --   --  1.8     Liver Function Tests: Recent Labs  Lab 04/22/21 0734  AST 25  ALT 30  ALKPHOS 102  BILITOT 0.8  PROT 6.1*  ALBUMIN 2.6*    No results for input(s): LIPASE, AMYLASE in the last 168 hours. No results for input(s): AMMONIA in the last 168 hours.  CBC: Recent Labs  Lab 04/22/21 0734 04/23/21 0324 04/24/21 0553 04/25/21 0834  04/26/21 0802  WBC 30.7* 25.1* 24.2* 22.9* 16.6*  NEUTROABS 25.9*  --  20.8*  --  13.6*  HGB 12.5 10.9* 11.0* 12.4 11.3*  HCT 35.6* 31.2* 32.4* 35.7* 32.4*  MCV 98.3 98.7 100.6* 99.7 98.2  PLT 253 296 352 447* 463*     Cardiac Enzymes: Recent Labs  Lab 04/22/21 0734  CKTOTAL 124     BNP: Invalid input(s): POCBNP  CBG: No results for input(s): GLUCAP in the last 168 hours.  Microbiology: Results for orders placed or performed during the hospital encounter of 04/22/21  Urine Culture     Status: Abnormal   Collection Time: 04/22/21  9:29 AM   Specimen: Urine, Clean Catch  Result Value Ref Range Status   Specimen Description   Final    URINE, CLEAN CATCH Performed at [facility], [address] [place], [place] [address]    Special Requests   Final    NONE Performed at [facility], [address] [place], [place] [address]    Culture >=100,000 COLONIES/mL ESCHERICHIA COLI (A)  Final   Report Status 04/25/2021 FINAL  Final   Organism ID, Bacteria ESCHERICHIA COLI (A)  Final      Susceptibility   Escherichia coli - MIC*    AMPICILLIN >=32 RESISTANT Resistant     CEFAZOLIN <=4 SENSITIVE Sensitive  CEFEPIME <=0.12 SENSITIVE Sensitive     CEFTRIAXONE <=0.25 SENSITIVE Sensitive     CIPROFLOXACIN <=0.25 SENSITIVE Sensitive     GENTAMICIN <=1 SENSITIVE Sensitive     IMIPENEM <=0.25 SENSITIVE Sensitive     NITROFURANTOIN <=16 SENSITIVE Sensitive     TRIMETH/SULFA <=20 SENSITIVE Sensitive     AMPICILLIN/SULBACTAM 16 INTERMEDIATE Intermediate     PIP/TAZO <=4 SENSITIVE Sensitive     * >=100,000 COLONIES/mL ESCHERICHIA COLI  Culture, blood (single)     Status: Abnormal   Collection Time: 04/22/21 10:18 AM   Specimen: BLOOD  Result Value Ref Range Status   Specimen Description   Final    BLOOD LEFT AC Performed at [facility], [address] [place], [place] [address]    Special Requests   Final    BOTTLES DRAWN AEROBIC AND  ANAEROBIC Blood Culture adequate volume Performed at [facility], [address] [place], [place] [address]    Culture  Setup Time   Final    GRAM NEGATIVE RODS IN BOTH AEROBIC AND ANAEROBIC BOTTLES CRITICAL RESULT CALLED TO, READ BACK BY AND VERIFIED WITH: JASON ROBBINS @ 4166 ON 04/23/2021.Marland KitchenMarland KitchenTKR GRAM POSITIVE COCCI ANAEROBIC BOTTLE ONLY CRITICAL RESULT CALLED TO, READ BACK BY AND VERIFIED WITH: D,MITCHELL PHARMD@1048  04/23/21 EB    Culture (A)  Final    ESCHERICHIA COLI STAPHYLOCOCCUS HOMINIS THE SIGNIFICANCE OF ISOLATING THIS ORGANISM FROM A SINGLE SET OF BLOOD CULTURES WHEN MULTIPLE SETS ARE DRAWN IS UNCERTAIN. PLEASE NOTIFY THE MICROBIOLOGY DEPARTMENT WITHIN ONE WEEK IF SPECIATION AND SENSITIVITIES ARE REQUIRED. Performed at [facility], [place] [address], [place], [place] [address]    Report Status 04/26/2021 FINAL  Final   Organism ID, Bacteria ESCHERICHIA COLI  Final      Susceptibility   Escherichia coli - MIC*    AMPICILLIN >=32 RESISTANT Resistant     CEFAZOLIN <=4 SENSITIVE Sensitive     CEFEPIME <=0.12 SENSITIVE Sensitive     CEFTAZIDIME <=1 SENSITIVE Sensitive     CEFTRIAXONE <=0.25 SENSITIVE Sensitive     CIPROFLOXACIN <=0.25 SENSITIVE Sensitive     GENTAMICIN <=1 SENSITIVE Sensitive     IMIPENEM 0.5 SENSITIVE Sensitive     TRIMETH/SULFA <=20 SENSITIVE Sensitive     AMPICILLIN/SULBACTAM 8 SENSITIVE Sensitive     PIP/TAZO <=4 SENSITIVE Sensitive     * ESCHERICHIA COLI  Blood Culture ID Panel (Reflexed)     Status: Abnormal   Collection Time: 04/22/21 10:18 AM  Result Value Ref Range Status   Enterococcus faecalis NOT DETECTED NOT DETECTED Final   Enterococcus Faecium NOT DETECTED NOT DETECTED Final   Listeria monocytogenes NOT DETECTED NOT DETECTED Final   Staphylococcus species NOT DETECTED NOT DETECTED Final   Staphylococcus aureus (BCID) NOT DETECTED NOT DETECTED Final   Staphylococcus epidermidis NOT DETECTED NOT DETECTED Final   Staphylococcus  lugdunensis NOT DETECTED NOT DETECTED Final   Streptococcus species NOT DETECTED NOT DETECTED Final   Streptococcus agalactiae NOT DETECTED NOT DETECTED Final   Streptococcus pneumoniae NOT DETECTED NOT DETECTED Final   Streptococcus pyogenes NOT DETECTED NOT DETECTED Final   A.calcoaceticus-baumannii NOT DETECTED NOT DETECTED Final   Bacteroides fragilis NOT DETECTED NOT DETECTED Final   Enterobacterales DETECTED (A) NOT DETECTED Final    Comment: Enterobacterales represent a large order of gram negative bacteria, not a single organism. CRITICAL RESULT CALLED TO, READ BACK BY AND VERIFIED WITH: JASON ROBBINS @ 6010 ON 04/23/2021.Marland KitchenMarland KitchenTKR    Enterobacter cloacae complex NOT DETECTED NOT DETECTED Final  Escherichia coli DETECTED (A) NOT DETECTED Final    Comment: CRITICAL RESULT CALLED TO, READ BACK BY AND VERIFIED WITH: JASON ROBBINS @ 4174 ON 04/23/2021.Marland KitchenMarland KitchenTKR    Klebsiella aerogenes NOT DETECTED NOT DETECTED Final   Klebsiella oxytoca NOT DETECTED NOT DETECTED Final   Klebsiella pneumoniae NOT DETECTED NOT DETECTED Final   Proteus species NOT DETECTED NOT DETECTED Final   Salmonella species NOT DETECTED NOT DETECTED Final   Serratia marcescens NOT DETECTED NOT DETECTED Final   Haemophilus influenzae NOT DETECTED NOT DETECTED Final   Neisseria meningitidis NOT DETECTED NOT DETECTED Final   Pseudomonas aeruginosa NOT DETECTED NOT DETECTED Final   Stenotrophomonas maltophilia NOT DETECTED NOT DETECTED Final   Candida albicans NOT DETECTED NOT DETECTED Final   Candida auris NOT DETECTED NOT DETECTED Final   Candida glabrata NOT DETECTED NOT DETECTED Final   Candida krusei NOT DETECTED NOT DETECTED Final   Candida parapsilosis NOT DETECTED NOT DETECTED Final   Candida tropicalis NOT DETECTED NOT DETECTED Final   Cryptococcus neoformans/gattii NOT DETECTED NOT DETECTED Final   CTX-M ESBL NOT DETECTED NOT DETECTED Final   Carbapenem resistance IMP NOT DETECTED NOT DETECTED Final   Carbapenem  resistance KPC NOT DETECTED NOT DETECTED Final   Carbapenem resistance NDM NOT DETECTED NOT DETECTED Final   Carbapenem resist OXA 48 LIKE NOT DETECTED NOT DETECTED Final   Carbapenem resistance VIM NOT DETECTED NOT DETECTED Final    Comment: Performed at [facility], [address] [place], [place] [address]  Blood Culture ID Panel (Reflexed)     Status: Abnormal   Collection Time: 04/22/21 10:18 AM  Result Value Ref Range Status   Enterococcus faecalis NOT DETECTED NOT DETECTED Final   Enterococcus Faecium NOT DETECTED NOT DETECTED Final   Listeria monocytogenes NOT DETECTED NOT DETECTED Final   Staphylococcus species DETECTED (A) NOT DETECTED Final    Comment: CRITICAL RESULT CALLED TO, READ BACK BY AND VERIFIED WITH: D,MITCHELL PHARMD@1048  04/23/21 EB    Staphylococcus aureus (BCID) NOT DETECTED NOT DETECTED Final   Staphylococcus epidermidis NOT DETECTED NOT DETECTED Final   Staphylococcus lugdunensis NOT DETECTED NOT DETECTED Final   Streptococcus species NOT DETECTED NOT DETECTED Final   Streptococcus agalactiae NOT DETECTED NOT DETECTED Final   Streptococcus pneumoniae NOT DETECTED NOT DETECTED Final   Streptococcus pyogenes NOT DETECTED NOT DETECTED Final   A.calcoaceticus-baumannii NOT DETECTED NOT DETECTED Final   Bacteroides fragilis NOT DETECTED NOT DETECTED Final   Enterobacterales DETECTED (A) NOT DETECTED Final    Comment: Enterobacterales represent a large order of gram negative bacteria, not a single organism. CRITICAL RESULT CALLED TO, READ BACK BY AND VERIFIED WITH: D,MITCHELL PHARMD@1048  04/23/21 EB    Enterobacter cloacae complex NOT DETECTED NOT DETECTED Final   Escherichia coli DETECTED (A) NOT DETECTED Final    Comment: CRITICAL RESULT CALLED TO, READ BACK BY AND VERIFIED WITH: D,MITCHELL PHARMD@1048  04/23/21 EB    Klebsiella aerogenes NOT DETECTED NOT DETECTED Final   Klebsiella oxytoca NOT DETECTED NOT DETECTED Final   Klebsiella pneumoniae NOT  DETECTED NOT DETECTED Final   Proteus species NOT DETECTED NOT DETECTED Final   Salmonella species NOT DETECTED NOT DETECTED Final   Serratia marcescens NOT DETECTED NOT DETECTED Final   Haemophilus influenzae NOT DETECTED NOT DETECTED Final   Neisseria meningitidis NOT DETECTED NOT DETECTED Final   Pseudomonas aeruginosa NOT DETECTED NOT DETECTED Final   Stenotrophomonas maltophilia NOT DETECTED NOT DETECTED Final   Candida albicans NOT DETECTED NOT DETECTED Final  Candida auris NOT DETECTED NOT DETECTED Final   Candida glabrata NOT DETECTED NOT DETECTED Final   Candida krusei NOT DETECTED NOT DETECTED Final   Candida parapsilosis NOT DETECTED NOT DETECTED Final   Candida tropicalis NOT DETECTED NOT DETECTED Final   Cryptococcus neoformans/gattii NOT DETECTED NOT DETECTED Final   CTX-M ESBL NOT DETECTED NOT DETECTED Final   Carbapenem resistance IMP NOT DETECTED NOT DETECTED Final   Carbapenem resistance KPC NOT DETECTED NOT DETECTED Final   Carbapenem resistance NDM NOT DETECTED NOT DETECTED Final   Carbapenem resist OXA 48 LIKE NOT DETECTED NOT DETECTED Final   Carbapenem resistance VIM NOT DETECTED NOT DETECTED Final    Comment: Performed at [facility] [address] [place], [place] [address]  Resp Panel by RT-PCR (Flu A&B, Covid) Nasopharyngeal Swab     Status: None   Collection Time: 04/22/21  5:07 PM   Specimen: Nasopharyngeal Swab; Nasopharyngeal(NP) swabs in vial transport medium  Result Value Ref Range Status   SARS Coronavirus 2 by RT PCR NEGATIVE NEGATIVE Final    Comment: (NOTE) SARS-CoV-2 target nucleic acids are NOT DETECTED.  The SARS-CoV-2 RNA is generally detectable in upper respiratory specimens during the acute phase of infection. The lowest concentration of SARS-CoV-2 viral copies this assay can detect is 138 copies/mL. A negative result does not preclude SARS-Cov-2 infection and should not be used as the sole basis for treatment or other patient  management decisions. A negative result may occur with  improper specimen collection/handling, submission of specimen other than nasopharyngeal swab, presence of viral mutation(s) within the areas targeted by this assay, and inadequate number of viral copies(<138 copies/mL). A negative result must be combined with clinical observations, patient history, and epidemiological information. The expected result is Negative.  Fact Sheet for Patients:  EntrepreneurPulse.com.au  Fact Sheet for Healthcare Providers:  IncredibleEmployment.be  This test is no t yet approved or cleared by the [place] FDA and  has been authorized for detection and/or diagnosis of SARS-CoV-2 by FDA under an Emergency Use Authorization (EUA). This EUA will remain  in effect (meaning this test can be used) for the duration of the COVID-19 declaration under Section 564(b)(1) of the Act, 21 U.S.C.section 360bbb-3(b)(1), unless the authorization is terminated  or revoked sooner.       Influenza A by PCR NEGATIVE NEGATIVE Final   Influenza B by PCR NEGATIVE NEGATIVE Final    Comment: (NOTE) The Xpert Xpress SARS-CoV-2/FLU/RSV plus assay is intended as an aid in the diagnosis of influenza from Nasopharyngeal swab specimens and should not be used as a sole basis for treatment. Nasal washings and aspirates are unacceptable for Xpert Xpress SARS-CoV-2/FLU/RSV testing.  Fact Sheet for Patients: EntrepreneurPulse.com.au  Fact Sheet for Healthcare Providers: IncredibleEmployment.be  This test is not yet approved or cleared by the [place] FDA and has been authorized for detection and/or diagnosis of SARS-CoV-2 by FDA under an Emergency Use Authorization (EUA). This EUA will remain in effect (meaning this test can be used) for the duration of the COVID-19 declaration under Section 564(b)(1) of the Act, 21 U.S.C. section 360bbb-3(b)(1),  unless the authorization is terminated or revoked.  Performed at [facility], [address], [place],  [address]   Surgical PCR screen     Status: None   Collection Time: 04/22/21  5:10 PM   Specimen: Nasal Mucosa; Nasal Swab  Result Value Ref Range Status   MRSA, PCR NEGATIVE NEGATIVE Final   Staphylococcus aureus NEGATIVE NEGATIVE  Final    Comment: (NOTE) The Xpert SA Assay (FDA approved for NASAL specimens in patients 69 years of age and older), is one component of a comprehensive surveillance program. It is not intended to diagnose infection nor to guide or monitor treatment. Performed at [facility], [address] [place], [place] [address]   MRSA Next Gen by PCR, Nasal     Status: None   Collection Time: 04/22/21  6:42 PM   Specimen: Nasal Mucosa; Nasal Swab  Result Value Ref Range Status   MRSA by PCR Next Gen NOT DETECTED NOT DETECTED Final    Comment: (NOTE) The GeneXpert MRSA Assay (FDA approved for NASAL specimens only), is one component of a comprehensive MRSA colonization surveillance program. It is not intended to diagnose MRSA infection nor to guide or monitor treatment for MRSA infections. Test performance is not FDA approved in patients less than 65 years old. Performed at [facility], [address] [place], [place] [address]   CULTURE, BLOOD (ROUTINE X 2) w Reflex to ID Panel     Status: None (Preliminary result)   Collection Time: 04/22/21  6:48 PM   Specimen: BLOOD  Result Value Ref Range Status   Specimen Description BLOOD BLOOD RIGHT HAND  Final   Special Requests   Final    BOTTLES DRAWN AEROBIC AND ANAEROBIC Blood Culture adequate volume   Culture   Final    NO GROWTH 4 DAYS Performed at [facility], [address], [place], [place] [address]    Report Status PENDING  Incomplete  CULTURE, BLOOD (ROUTINE X 2) w Reflex to ID Panel     Status: None (Preliminary result)   Collection Time:  04/22/21  7:03 PM   Specimen: BLOOD  Result Value Ref Range Status   Specimen Description BLOOD BLOOD LEFT HAND  Final   Special Requests   Final    BOTTLES DRAWN AEROBIC AND ANAEROBIC Blood Culture adequate volume   Culture   Final    NO GROWTH 4 DAYS Performed at [facility], [address] [place], [place] [address]    Report Status PENDING  Incomplete    Coagulation Studies: No results for input(s): LABPROT, INR in the last 72 hours.  Urinalysis: No results for input(s): COLORURINE, LABSPEC, PHURINE, GLUCOSEU, HGBUR, BILIRUBINUR, KETONESUR, PROTEINUR, UROBILINOGEN, NITRITE, LEUKOCYTESUR in the last 72 hours.  Invalid input(s): APPERANCEUR     Imaging: No results found.   Medications:      cephALEXin  500 mg Oral Q12H   feeding supplement  237 mL Oral BID BM   heparin  5,000 Units Subcutaneous Q8H   multivitamin with minerals  1 tablet Oral Daily   nicotine  14 mg Transdermal Daily   traZODone  50 mg Oral QHS   morphine injection, oxyCODONE, polyethylene glycol  Assessment/ Plan:  Ms. Lynn Norton is a 71 y.o.  female with past medical conditions including arthritis, hematuria, hypertension, OA, RA, hepatitis, and osteoporosis, who was admitted to [facility] on 04/22/2021 for Weakness [R53.1] Fall, initial encounter [W19.XXXA] Elbow fracture, right, closed, initial encounter [S42.401A] Sepsis ([place]) [A41.9] Urinary tract infection without hematuria, site unspecified [N39.0] Sepsis, due to unspecified organism, unspecified whether acute organ dysfunction present ([place]) [A41.9]   Acute kidney injury likely secondary to progression of urinary tract infection.  Normal renal function July 2022.  Renal ultrasound negative for obstruction.  No IV contrast exposure.    Renal function slowly improving.  Continue to encourage oral intake  with Ensure option between meals.  No acute indication for dialysis.  Lab Results  Component Value Date   CREATININE 1.85 (H)  04/26/2021   CREATININE 2.17 (H) 04/25/2021   CREATININE 2.23 (H) 04/24/2021    Intake/Output Summary (Last 24 hours) at 04/26/2021 1107 Last data filed at 04/26/2021 0800 Gross per 24 hour  Intake --  Output 2300 ml  Net -2300 ml    2. Hypertension .  Blood pressure elevated overnight, given one-time dose IV labetalol 10 mg.  May consider restarting amlodipine.   3.  Sepsis secondary to UTI.  UA suggestive of UTI, currently awaiting urine culture.  IV antibiotics course completed.  Blood culture positive for E. coli from suspected urinary tract infection.  4. Anemia of chronic disease Hemoglobin remains within acceptable range  5.Hyponatremia Sodium stable   LOS: 4   3/5/202311:07 AM

## 2021-04-26 NOTE — Assessment & Plan Note (Addendum)
-   BP meds initially held on admission ?- Amlodipine resumed, blood pressure better controlled ?

## 2021-04-26 NOTE — Plan of Care (Signed)

## 2021-04-26 NOTE — Progress Notes (Signed)
?Progress Note ? ? ?Patient: Lynn Norton FBP:[id number] DOB: [birth year] DOA: 04/22/2021     4 ?DOS: the patient was seen and examined on 04/26/2021 ?  ?Brief hospital course: ?Lynn Norton is a 71 y.o. female with history of rheumatoid arthritis who presented from home with complaints of multiple falls over last few days prior to admission and pain in the right elbow. Patient lives with her husband in a house.  She ambulates with help of a cane.   ?She had become progressively more weak and unsteady resulting in falls at home. ?X-ray on admission showed a right nondisplaced mildly comminuted fracture through the right trochlea. ?Orthopedic surgery was consulted and she was placed in a sling with plans for operative repair. ?Her work-up was also notable for evidence of a UTI with complication of bacteremia as well.  Surgery was delayed while infection was treated.  She also developed an AKI for which nephrology was also consulted. ? ?Assessment and Plan: ?* Sepsis secondary to UTI (HCC)-resolved as of 04/26/2021 ?Presented with elevated leukocytes, elevated procalcitonin, AKI.  Source is UTI.   ?-Continue aztreonam ? ?Closed fracture dislocation of right elbow ?Presented with fall from home.  Complained of right elbow pain on presentation.  X-ray of the right elbow showed nondisplaced mildly comminuted fracture of right trochlea.  Currently in a sling.  Orthopedics consulted and following.   ?-Tentative plan is for ORIF on Monday ? ?Bacteremia due to Escherichia coli ?- Suspected translocation from urinary source.  Blood cultures noted with E. Coli ?-Treated with aztreonam due to allergy profile initially ?-Discussed with pharmacy on 04/26/2021, will transition to Keflex to complete total of 10-day course ?- Repeat cultures have been negative ? ?Frequent falls ?Falling several times at home.  History of arthritis.  Ambulates with the help of cane.  Lives with husband.  PT/OT consulted, recommended skilled nursing  facility on discharge ? ?AKI (acute kidney injury) ([place]) ?- No known history of CKD; no prior labs for comparison ?-Creatinine 2.1 on admission ?- Renal ultrasound negative for hydronephrosis or obstruction ?- Nephrology following ?- Now off fluids and tolerating diet well ?- BMP daily; creatinine improving slowly ? ?HTN (hypertension) ?- BP meds have been on hold ?- Blood pressure now trending up ?- Resume amlodipine and monitor response ? ?Rheumatoid arthritis ([place]) ?Takes methotrexate and prednisone at home.  Currently on hold ? ?Tobacco use ?Smokes 20 cigarettes a day.  Counseled for cessation.  Offered nicotine patch ? ?Hyponatremia-resolved as of 04/26/2021 ?Thought to be secondary to hypovolemic hyponatremia.  Improving .Marland Kitchen  Monitor BMP ? ? ? ? ?Subjective: No events overnight. Resting comfortably in bed. Still planned for surgery on Monday.  ?She was comfortable when seen.  ? ?Physical Exam: ?Vitals:  ? 04/25/21 2143 04/26/21 0345 04/26/21 0449 04/26/21 0816  ?BP: (!) 154/74 (!) 171/91 (!) 151/67 (!) 151/67  ?Pulse: 100 (!) 105  87  ?Resp: 16 20  16   ?Temp: 97.8 ?F (36.6 ?C) 98.3 ?F (36.8 ?C)  98.5 ?F (36.9 ?C)  ?TempSrc:      ?SpO2: 97% 90% 97% 97%  ?Weight:      ?Height:      ? ?Physical Exam ?Constitutional:   ?   Appearance: Normal appearance.  ?HENT:  ?   Head: Normocephalic and atraumatic.  ?   Mouth/Throat:  ?   Mouth: Mucous membranes are moist.  ?Eyes:  ?   Extraocular Movements: Extraocular movements intact.  ?Cardiovascular:  ?   Rate and Rhythm:  Normal rate and regular rhythm.  ?Pulmonary:  ?   Effort: Pulmonary effort is normal.  ?   Breath sounds: Normal breath sounds.  ?Abdominal:  ?   General: Bowel sounds are normal. There is no distension.  ?   Palpations: Abdomen is soft.  ?   Tenderness: There is no abdominal tenderness.  ?Musculoskeletal:  ?   Cervical back: Normal range of motion and neck supple.  ?   Comments: Right upper extremity in sling with dressing noted underneath.  Right hand is  edematous approximately 3+  ?Skin: ?   General: Skin is warm and dry.  ?Neurological:  ?   General: No focal deficit present.  ?   Mental Status: She is alert.  ?Psychiatric:     ?   Mood and Affect: Mood normal.     ?   Behavior: Behavior normal.  ? ? ? ?Data Reviewed: ? ?Results for orders placed or performed during the hospital encounter of 04/22/21 (from the past 24 hour(s))  ?Basic metabolic panel     Status: Abnormal  ? Collection Time: 04/26/21  8:02 AM  ?Result Value Ref Range  ? Sodium 136 135 - 145 mmol/L  ? Potassium 4.7 3.5 - 5.1 mmol/L  ? Chloride 109 98 - 111 mmol/L  ? CO2 20 (L) 22 - 32 mmol/L  ? Glucose, Bld 80 70 - 99 mg/dL  ? BUN 38 (H) 8 - 23 mg/dL  ? Creatinine, Ser 1.85 (H) 0.44 - 1.00 mg/dL  ? Calcium 7.2 (L) 8.9 - 10.3 mg/dL  ? GFR, Estimated 28 (L) >60 mL/min  ? Anion gap 7 5 - 15  ?Magnesium     Status: None  ? Collection Time: 04/26/21  8:02 AM  ?Result Value Ref Range  ? Magnesium 1.8 1.7 - 2.4 mg/dL  ?CBC with Differential/Platelet     Status: Abnormal  ? Collection Time: 04/26/21  8:02 AM  ?Result Value Ref Range  ? WBC 16.6 (H) 4.0 - 10.5 K/uL  ? RBC 3.30 (L) 3.87 - 5.11 MIL/uL  ? Hemoglobin 11.3 (L) 12.0 - 15.0 g/dL  ? HCT 32.4 (L) 36.0 - 46.0 %  ? MCV 98.2 80.0 - 100.0 fL  ? MCH 34.2 (H) 26.0 - 34.0 pg  ? MCHC 34.9 30.0 - 36.0 g/dL  ? RDW 14.4 11.5 - 15.5 %  ? Platelets 463 (H) 150 - 400 K/uL  ? nRBC 0.0 0.0 - 0.2 %  ? Neutrophils Relative % 82 %  ? Neutro Abs 13.6 (H) 1.7 - 7.7 K/uL  ? Lymphocytes Relative 4 %  ? Lymphs Abs 0.7 0.7 - 4.0 K/uL  ? Monocytes Relative 11 %  ? Monocytes Absolute 1.9 (H) 0.1 - 1.0 K/uL  ? Eosinophils Relative 1 %  ? Eosinophils Absolute 0.2 0.0 - 0.5 K/uL  ? Basophils Relative 1 %  ? Basophils Absolute 0.1 0.0 - 0.1 K/uL  ? Immature Granulocytes 1 %  ? Abs Immature Granulocytes 0.15 (H) 0.00 - 0.07 K/uL  ?  ?I have Reviewed nursing notes, Vitals, and Lab results since pt's last encounter. Pertinent lab results : see above ?I have ordered test including  BMP, CBC, Mg ?I have reviewed the last note from staff over past 24 hours ?I have discussed pt's care plan and test results with nursing staff, case manager ? ? ?Family Communication:  ? ?Disposition: ?Status is: Inpatient ?Remains inpatient appropriate because: Treatment as outlined in A&P ? ? Planned Discharge Destination: Skilled nursing facility ? ?  Antimicrobials: ?Aztreonam 04/23/2021 >>04/26/21 ?Keflex 04/26/21 >> current  ? ?Consultants: ?Orthopedic surgery ?Nephrology ? ?Procedures:  ? ? ?DVT ppx:  ?heparin injection 5,000 Units Start: 04/22/21 1400 ? ? ?  Code Status: Full Code  ? ? ?Author: ?Dwyane Dee, MD ?04/26/2021 1:34 PM ? ?For on call review [email or web address].  ? ?

## 2021-04-27 ENCOUNTER — Other Ambulatory Visit: Payer: Self-pay

## 2021-04-27 ENCOUNTER — Inpatient Hospital Stay: Payer: Medicare Other | Admitting: Anesthesiology

## 2021-04-27 ENCOUNTER — Encounter: Payer: Self-pay | Admitting: Internal Medicine

## 2021-04-27 ENCOUNTER — Encounter
Admission: EM | Disposition: A | Discharge status: Skilled Nursing Facility | Payer: Self-pay | Source: Home / Self Care | Attending: Internal Medicine

## 2021-04-27 ENCOUNTER — Inpatient Hospital Stay: Payer: Medicare Other

## 2021-04-27 HISTORY — PX: ORIF ELBOW FRACTURE: SHX5031

## 2021-04-27 LAB — BASIC METABOLIC PANEL
Anion gap: 6 (ref 5–15)
Anion gap: 9 (ref 5–15)
BUN: 47 mg/dL — ABNORMAL HIGH (ref 8–23)
BUN: 45 mg/dL — ABNORMAL HIGH (ref 8–23)
CO2: 24 mmol/L (ref 22–32)
CO2: 23 mmol/L (ref 22–32)
Calcium: 8.2 mg/dL — ABNORMAL LOW (ref 8.9–10.3)
Calcium: 8.1 mg/dL — ABNORMAL LOW (ref 8.9–10.3)
Chloride: 104 mmol/L (ref 98–111)
Chloride: 102 mmol/L (ref 98–111)
Creatinine, Ser: 2.26 mg/dL — ABNORMAL HIGH (ref 0.44–1.00)
Creatinine, Ser: 2.23 mg/dL — ABNORMAL HIGH (ref 0.44–1.00)
GFR, Estimated: 22 mL/min — ABNORMAL LOW (ref >60)
GFR, Estimated: 23 mL/min — ABNORMAL LOW (ref >60)
Glucose, Bld: 101 mg/dL — ABNORMAL HIGH (ref 70–99)
Glucose, Bld: 170 mg/dL — ABNORMAL HIGH (ref 70–99)
Potassium: 5.2 mmol/L — ABNORMAL HIGH (ref 3.5–5.1)
Potassium: 5.6 mmol/L — ABNORMAL HIGH (ref 3.5–5.1)
Sodium: 134 mmol/L — ABNORMAL LOW (ref 135–145)
Sodium: 134 mmol/L — ABNORMAL LOW (ref 135–145)

## 2021-04-27 LAB — CULTURE, BLOOD (ROUTINE X 2)
Culture: NO GROWTH
Culture: NO GROWTH
Special Requests: ADEQUATE
Special Requests: ADEQUATE

## 2021-04-27 LAB — PROTEIN ELECTROPHORESIS, SERUM
A/G Ratio: 0.8 (ref 0.7–1.7)
Albumin ELP: 2.4 g/dL — ABNORMAL LOW (ref 2.9–4.4)
Alpha-1-Globulin: 0.6 g/dL — ABNORMAL HIGH (ref 0.0–0.4)
Alpha-2-Globulin: 1 g/dL (ref 0.4–1.0)
Beta Globulin: 0.7 g/dL (ref 0.7–1.3)
Gamma Globulin: 0.6 g/dL (ref 0.4–1.8)
Globulin, Total: 2.9 g/dL (ref 2.2–3.9)
Total Protein ELP: 5.3 g/dL — ABNORMAL LOW (ref 6.0–8.5)

## 2021-04-27 LAB — ANCA PROFILE
Anti-MPO Antibodies: <0.2 units (ref 0.0–0.9)
Anti-PR3 Antibodies: <0.2 units (ref 0.0–0.9)
Atypical P-ANCA titer: <1:20 {titer}
C-ANCA: <1:20 {titer}
P-ANCA: <1:20 {titer}

## 2021-04-27 SURGERY — OPEN REDUCTION INTERNAL FIXATION (ORIF) ELBOW/OLECRANON FRACTURE
Anesthesia: General | Site: Elbow | Laterality: Right

## 2021-04-27 MED ORDER — BUPIVACAINE HCL (PF) 0.5 % IJ SOLN
INTRAMUSCULAR | Status: AC
  Filled 2021-04-27: qty 30

## 2021-04-27 MED ORDER — MIDAZOLAM HCL 2 MG/2ML IJ SOLN
INTRAMUSCULAR | Status: AC
  Filled 2021-04-27: qty 2

## 2021-04-27 MED ORDER — SODIUM CHLORIDE 0.9 % IV SOLN
INTRAVENOUS | Status: DC
Start: 2021-04-27 — End: 2021-04-29

## 2021-04-27 MED ORDER — PHENYLEPHRINE HCL (PRESSORS) 10 MG/ML IV SOLN
INTRAVENOUS | Status: DC | PRN
  Administered 2021-04-27: 160 ug via INTRAVENOUS

## 2021-04-27 MED ORDER — ONDANSETRON HCL 4 MG/2ML IJ SOLN: 4.0000 mg | Freq: Once | INTRAMUSCULAR | Status: DC | PRN

## 2021-04-27 MED ORDER — CEFAZOLIN SODIUM-DEXTROSE 1-4 GM/50ML-% IV SOLN
1.0000 g | INTRAVENOUS | Status: AC
  Administered 2021-04-27: 1 g via INTRAVENOUS

## 2021-04-27 MED ORDER — BUPIVACAINE HCL (PF) 0.5 % IJ SOLN
INTRAMUSCULAR | Status: DC | PRN
  Administered 2021-04-27: 10 mL

## 2021-04-27 MED ORDER — PROPOFOL 10 MG/ML IV BOLUS
INTRAVENOUS | Status: AC
  Filled 2021-04-27: qty 20

## 2021-04-27 MED ORDER — PROPOFOL 10 MG/ML IV BOLUS
INTRAVENOUS | Status: DC | PRN
  Administered 2021-04-27: 80 mg via INTRAVENOUS

## 2021-04-27 MED ORDER — DEXAMETHASONE SODIUM PHOSPHATE 10 MG/ML IJ SOLN
INTRAMUSCULAR | Status: DC | PRN
Start: 2021-04-27 — End: 2021-04-27
  Administered 2021-04-27: 5 mg via INTRAVENOUS

## 2021-04-27 MED ORDER — FENTANYL CITRATE (PF) 100 MCG/2ML IJ SOLN: 25.0000 ug | INTRAMUSCULAR | Status: DC | PRN

## 2021-04-27 MED ORDER — ONDANSETRON HCL 4 MG/2ML IJ SOLN
INTRAMUSCULAR | Status: DC | PRN
  Administered 2021-04-27: 4 mg via INTRAVENOUS

## 2021-04-27 MED ORDER — MIDAZOLAM HCL 2 MG/2ML IJ SOLN
INTRAMUSCULAR | Status: DC | PRN
Start: 2021-04-27 — End: 2021-04-27
  Administered 2021-04-27: 2 mg via INTRAVENOUS

## 2021-04-27 MED ORDER — CEFAZOLIN SODIUM-DEXTROSE 1-4 GM/50ML-% IV SOLN
INTRAVENOUS | Status: AC
  Filled 2021-04-27: qty 50

## 2021-04-27 MED ORDER — DEXAMETHASONE SODIUM PHOSPHATE 10 MG/ML IJ SOLN
INTRAMUSCULAR | Status: AC
  Filled 2021-04-27: qty 1

## 2021-04-27 MED ORDER — FENTANYL CITRATE (PF) 100 MCG/2ML IJ SOLN
INTRAMUSCULAR | Status: AC
  Filled 2021-04-27: qty 2

## 2021-04-27 MED ORDER — ONDANSETRON HCL 4 MG/2ML IJ SOLN
INTRAMUSCULAR | Status: AC
  Filled 2021-04-27: qty 2

## 2021-04-27 MED ORDER — FENTANYL CITRATE (PF) 100 MCG/2ML IJ SOLN
INTRAMUSCULAR | Status: DC | PRN
  Administered 2021-04-27: 50 ug via INTRAVENOUS
  Administered 2021-04-27 (×2): 25 ug via INTRAVENOUS

## 2021-04-27 MED ORDER — LIDOCAINE HCL (CARDIAC) PF 100 MG/5ML IV SOSY
PREFILLED_SYRINGE | INTRAVENOUS | Status: DC | PRN
  Administered 2021-04-27: 50 mg via INTRAVENOUS

## 2021-04-27 MED ORDER — 0.9 % SODIUM CHLORIDE (POUR BTL) OPTIME
TOPICAL | Status: DC | PRN
  Administered 2021-04-27: 500 mL

## 2021-04-27 SURGICAL SUPPLY — 44 items
BIT DRILL CANN LRG QC 5X300 (BIT) ×1 IMPLANT
BNDG ELASTIC 4X5.8 VLCR NS LF (GAUZE/BANDAGES/DRESSINGS) ×4 IMPLANT
CHLORAPREP W/TINT 26 (MISCELLANEOUS) ×2 IMPLANT
CUFF TOURN SGL QUICK 18X4 (TOURNIQUET CUFF) IMPLANT
CUFF TOURN SGL QUICK 24 (TOURNIQUET CUFF)
CUFF TRNQT CYL 24X4X16.5-23 (TOURNIQUET CUFF) IMPLANT
DRAPE 3/4 80X56 (DRAPES) ×2 IMPLANT
DRAPE C-ARM XRAY 36X54 (DRAPES) IMPLANT
ELECT CAUTERY BLADE 6.4 (BLADE) ×2 IMPLANT
ELECT REM PT RETURN 9FT ADLT (ELECTROSURGICAL) ×2
ELECTRODE REM PT RTRN 9FT ADLT (ELECTROSURGICAL) ×1 IMPLANT
GAUZE SPONGE 4X4 12PLY STRL (GAUZE/BANDAGES/DRESSINGS) ×2 IMPLANT
GAUZE XEROFORM 1X8 LF (GAUZE/BANDAGES/DRESSINGS) ×2 IMPLANT
GLOVE SURG SYN 9.0  PF PI (GLOVE) ×1
GLOVE SURG SYN 9.0 PF PI (GLOVE) ×1 IMPLANT
GOWN SRG 2XL LVL 4 RGLN SLV (GOWNS) ×1 IMPLANT
GOWN STRL NON-REIN 2XL LVL4 (GOWNS) ×1
GOWN STRL REUS W/ TWL LRG LVL3 (GOWN DISPOSABLE) ×1 IMPLANT
GOWN STRL REUS W/TWL LRG LVL3 (GOWN DISPOSABLE) ×1
GUIDEWIRE THREADED 2.8 (WIRE) ×1 IMPLANT
KIT TURNOVER KIT A (KITS) ×2 IMPLANT
MANIFOLD NEPTUNE II (INSTRUMENTS) ×2 IMPLANT
NDL FILTER BLUNT 18X1 1/2 (NEEDLE) ×1 IMPLANT
NEEDLE FILTER BLUNT 18X 1/2SAF (NEEDLE) ×1
NEEDLE FILTER BLUNT 18X1 1/2 (NEEDLE) ×1 IMPLANT
NS IRRIG 500ML POUR BTL (IV SOLUTION) ×2 IMPLANT
PACK EXTREMITY ARMC (MISCELLANEOUS) ×2 IMPLANT
PAD ABD DERMACEA PRESS 5X9 (GAUZE/BANDAGES/DRESSINGS) ×4 IMPLANT
PAD CAST CTTN 4X4 STRL (SOFTGOODS) ×3 IMPLANT
PAD PREP 24X41 OB/GYN DISP (PERSONAL CARE ITEMS) ×2 IMPLANT
PADDING CAST COTTON 4X4 STRL (SOFTGOODS) ×3
SCALPEL PROTECTED #15 DISP (BLADE) ×4 IMPLANT
SCREW 6.5MM CANN 32X80 (Screw) ×1 IMPLANT
SPLINT CAST 1 STEP 5X30 WHT (MISCELLANEOUS) ×2 IMPLANT
SPONGE T-LAP 18X18 ~~LOC~~+RFID (SPONGE) ×2 IMPLANT
STAPLER SKIN PROX 35W (STAPLE) ×2 IMPLANT
STRIP CLOSURE SKIN 1/2X4 (GAUZE/BANDAGES/DRESSINGS) ×2 IMPLANT
SUT ETHILON 3-0 FS-10 30 BLK (SUTURE) ×2
SUT VIC AB 0 CT2 27 (SUTURE) ×2 IMPLANT
SUT VIC AB 2-0 CT2 27 (SUTURE) ×2 IMPLANT
SUTURE EHLN 3-0 FS-10 30 BLK (SUTURE) ×1 IMPLANT
SYR 5ML LL (SYRINGE) ×2 IMPLANT
WATER STERILE IRR 500ML POUR (IV SOLUTION) ×2 IMPLANT
WIRE K 2.8X14 THR (MISCELLANEOUS) ×1 IMPLANT

## 2021-04-27 NOTE — Op Note (Signed)
04/27/2021 ? ?4:25 PM ? ?PATIENT:  Lynn Norton  71 y.o. female ? ?PRE-OPERATIVE DIAGNOSIS:  proximal ulna fracture ? ?POST-OPERATIVE DIAGNOSIS:  proximal ulna fracture ? ?PROCEDURE:  Procedure(s): ?OPEN REDUCTION INTERNAL FIXATION (ORIF) ELBOW/OLECRANON FRACTURE (Right) ? ?SURGEON: Laurene Footman, MD ? ?ASSISTANTS: None ? ?ANESTHESIA:   General ? ?EBL:  Total I/O ?In: 37 [IV Piggyback:50] ?Out: 825 [Urine:825] ? ?BLOOD ADMINISTERED:none ? ?DRAINS: none  ? ?LOCAL MEDICATIONS USED:  MARCAINE    ? ?SPECIMEN:  No Specimen ? ?DISPOSITION OF SPECIMEN:  N/A ? ?COUNTS:  YES ? ?TOURNIQUET: None ? ?IMPLANTS: 6.5 Synthes cannulated screw 80 mm length ? ?DICTATION: .Dragon Dictation patient was brought to the operating room and after adequate general anesthesia was obtained the right arm was prepped and draped in the usual sterile fashion.  After patient identification and timeout procedures were completed incision was made posterior to the olecranon with soft tissue spread with triceps split.  A single guidewire was inserted through the tip of the olecranon down the shaft and the midline in both AP and lateral imaging of the proximal distal fragments.  After this was placed measurement was made and proximal fragment drilled then the 80 mm screw was inserted with the fracture getting compressed.  There were no complications and the guidewire was removed followed by permanent mini C arm views.  The wound was irrigated and the triceps repaired using 0 Vicryl followed by 2 0 Vicryl subcutaneously and 3-0 nylon for the skin.  Xeroform 4 x 4's web roll and a splint holding the elbow in 90 degrees flexion were applied. ? ?PLAN OF CARE: Admit to inpatient  ? ?PATIENT DISPOSITION:  PACU - hemodynamically stable. ?  ? ?

## 2021-04-27 NOTE — Progress Notes (Signed)
PT Cancellation Note ? ?Patient Details ?Name: Lynn Norton ?MRN: [id number] ?DOB: [birth year] ? ? ?Cancelled Treatment:    Reason Eval/Treat Not Completed: Patient at procedure or test/unavailable. Per EMR, orthopedics planning for R elbow ORIF today. Will d/c current orders and hold until after procedure. Please place new orders when pt is appropriate to initiate mobility after operation.  ? ?Salem Caster. Fairly IV, PT, DPT ?Physical Therapist- [place]  [facility]  ?04/27/2021, 8:23 AM ?

## 2021-04-27 NOTE — Progress Notes (Signed)
OT Cancellation Note ? ?Patient Details ?Name: Lynn Norton ?MRN: [id number] ?DOB: 1950/04/14 ? ? ?Cancelled Treatment:    Reason Eval/Treat Not Completed: Other (comment)Per EMR, orthopedics planning for R elbow ORIF today. Will d/c current orders and hold until after procedure. Please place new orders when pt is appropriate to initiate mobility after operation.  ? ?Darleen Crocker, MS, OTR/L , CBIS ?ascom [phone]  ?04/27/21, 10:30 AM  ?

## 2021-04-27 NOTE — Progress Notes (Signed)
Patient improved medically, plan ORIF later today, site marked. ?

## 2021-04-27 NOTE — Transfer of Care (Signed)
Immediate Anesthesia Transfer of Care Note ? ?Patient: Lynn Norton ? ?Procedure(s) Performed: OPEN REDUCTION INTERNAL FIXATION (ORIF) ELBOW/OLECRANON FRACTURE (Right: Elbow) ? ?Patient Location: PACU ? ?Anesthesia Type:General ? ?Level of Consciousness: sedated ? ?Airway & Oxygen Therapy: Patient Spontanous Breathing and Patient connected to face mask oxygen ? ?Post-op Assessment: Report given to RN and Post -op Vital signs reviewed and stable ? ?Post vital signs: Reviewed and stable ? ?Last Vitals:  ?Vitals Value Taken Time  ?BP 107/62 04/27/21 1621  ?Temp    ?Pulse 78 04/27/21 1623  ?Resp 15 04/27/21 1623  ?SpO2 99 % 04/27/21 1623  ?Vitals shown include unvalidated device data. ? ?Last Pain:  ?Vitals:  ? 04/27/21 1501  ?TempSrc: Oral  ?PainSc: 6   ?   ? ?Patients Stated Pain Goal: 0 (04/25/21 2143) ? ?Complications: No notable events documented. ?

## 2021-04-27 NOTE — Progress Notes (Signed)
[facility]  ROUNDING NOTE   Subjective:   Patient resting quietly in bed, alert and oriented Currently n.p.o. for scheduled procedure Mild pain reported from right arm, managed well with medication  Creatinine increased 2.26 UOP 1.1L in past 24 hours  Objective:  Vital signs in last 24 hours:  Temp:  [97.7 F (36.5 C)-98.5 F (36.9 C)] 97.7 F (36.5 C) (03/06 1156) Pulse Rate:  [83-102] 83 (03/06 1156) Resp:  [16-18] 17 (03/06 1156) BP: (138-178)/(62-90) 140/62 (03/06 1156) SpO2:  [96 %-97 %] 97 % (03/06 1156)  Weight change:  Filed Weights   04/22/21 1100 04/22/21 1900  Weight: 45.8 kg 46.1 kg    Intake/Output: I/O last 3 completed shifts: In: -  Out: 2150 [Urine:2150]   Intake/Output this shift:  Total I/O In: -  Out: 825 [Urine:825]  Physical Exam: General: NAD  Head: Normocephalic, atraumatic. Moist oral mucosal membranes  Eyes: Anicteric  Lungs:  Clear to auscultation, normal effort  Heart: Regular rate and rhythm  Abdomen:  Soft, nontender  Extremities: No peripheral edema.  Neurologic: Nonfocal, moving all four extremities  Skin: No lesions       Basic Metabolic Panel: Recent Labs  Lab 04/23/21 0324 04/24/21 0553 04/25/21 0834 04/26/21 0802 04/27/21 0959  NA 132* 134* 136 136 134*  K 4.9 4.9 5.0 4.7 5.2*  CL 100 107 105 109 104  CO2 22 21* 24 20* 24  GLUCOSE 107* 96 109* 80 101*  BUN 37* 41* 41* 38* 47*  CREATININE 2.26* 2.23* 2.17* 1.85* 2.26*  CALCIUM 7.1* 7.0* 7.8* 7.2* 8.2*  MG  --   --   --  1.8  --      Liver Function Tests: Recent Labs  Lab 04/22/21 0734  AST 25  ALT 30  ALKPHOS 102  BILITOT 0.8  PROT 6.1*  ALBUMIN 2.6*    No results for input(s): LIPASE, AMYLASE in the last 168 hours. No results for input(s): AMMONIA in the last 168 hours.  CBC: Recent Labs  Lab 04/22/21 0734 04/23/21 0324 04/24/21 0553 04/25/21 0834 04/26/21 0802  WBC 30.7* 25.1* 24.2* 22.9* 16.6*  NEUTROABS 25.9*  --  20.8*   --  13.6*  HGB 12.5 10.9* 11.0* 12.4 11.3*  HCT 35.6* 31.2* 32.4* 35.7* 32.4*  MCV 98.3 98.7 100.6* 99.7 98.2  PLT 253 296 352 447* 463*     Cardiac Enzymes: Recent Labs  Lab 04/22/21 0734  CKTOTAL 124     BNP: Invalid input(s): POCBNP  CBG: No results for input(s): GLUCAP in the last 168 hours.  Microbiology: Results for orders placed or performed during the hospital encounter of 04/22/21  Urine Culture     Status: Abnormal   Collection Time: 04/22/21  9:29 AM   Specimen: Urine, Clean Catch  Result Value Ref Range Status   Specimen Description   Final    URINE, CLEAN CATCH Performed at [facility], [address] [place], [place] [address]    Special Requests   Final    NONE Performed at [facility], [address] [place], [place] [address]    Culture >=100,000 COLONIES/mL ESCHERICHIA COLI (A)  Final   Report Status 04/25/2021 FINAL  Final   Organism ID, Bacteria ESCHERICHIA COLI (A)  Final      Susceptibility   Escherichia coli - MIC*    AMPICILLIN >=32 RESISTANT Resistant     CEFAZOLIN <=4 SENSITIVE Sensitive     CEFEPIME <=0.12 SENSITIVE Sensitive  CEFTRIAXONE <=0.25 SENSITIVE Sensitive     CIPROFLOXACIN <=0.25 SENSITIVE Sensitive     GENTAMICIN <=1 SENSITIVE Sensitive     IMIPENEM <=0.25 SENSITIVE Sensitive     NITROFURANTOIN <=16 SENSITIVE Sensitive     TRIMETH/SULFA <=20 SENSITIVE Sensitive     AMPICILLIN/SULBACTAM 16 INTERMEDIATE Intermediate     PIP/TAZO <=4 SENSITIVE Sensitive     * >=100,000 COLONIES/mL ESCHERICHIA COLI  Culture, blood (single)     Status: Abnormal   Collection Time: 04/22/21 10:18 AM   Specimen: BLOOD  Result Value Ref Range Status   Specimen Description   Final    BLOOD LEFT AC Performed at [facility], [address] [place], [place] [address]    Special Requests   Final    BOTTLES DRAWN AEROBIC AND ANAEROBIC Blood Culture adequate volume Performed at [facility], [address] [place], [place] [address]    Culture  Setup Time   Final    GRAM NEGATIVE RODS IN BOTH AEROBIC AND ANAEROBIC BOTTLES CRITICAL RESULT CALLED TO, READ BACK BY AND VERIFIED WITH: JASON ROBBINS @ [phone] ON 04/23/2021.Marland KitchenMarland KitchenTKR GRAM POSITIVE COCCI ANAEROBIC BOTTLE ONLY CRITICAL RESULT CALLED TO, READ BACK BY AND VERIFIED WITH: D,MITCHELL PHARMD@1048  04/23/21 EB    Culture (A)  Final    ESCHERICHIA COLI STAPHYLOCOCCUS HOMINIS THE SIGNIFICANCE OF ISOLATING THIS ORGANISM FROM A SINGLE SET OF BLOOD CULTURES WHEN MULTIPLE SETS ARE DRAWN IS UNCERTAIN. PLEASE NOTIFY THE MICROBIOLOGY DEPARTMENT WITHIN ONE WEEK IF SPECIATION AND SENSITIVITIES ARE REQUIRED. Performed at [facility], [place] [address] [place], [place] [address]    Report Status 04/26/2021 FINAL  Final   Organism ID, Bacteria ESCHERICHIA COLI  Final      Susceptibility   Escherichia coli - MIC*    AMPICILLIN >=32 RESISTANT Resistant     CEFAZOLIN <=4 SENSITIVE Sensitive     CEFEPIME <=0.12 SENSITIVE Sensitive     CEFTAZIDIME <=1 SENSITIVE Sensitive     CEFTRIAXONE <=0.25 SENSITIVE Sensitive     CIPROFLOXACIN <=0.25 SENSITIVE Sensitive     GENTAMICIN <=1 SENSITIVE Sensitive     IMIPENEM 0.5 SENSITIVE Sensitive     TRIMETH/SULFA <=20 SENSITIVE Sensitive     AMPICILLIN/SULBACTAM 8 SENSITIVE Sensitive     PIP/TAZO <=4 SENSITIVE Sensitive     * ESCHERICHIA COLI  Blood Culture ID Panel (Reflexed)     Status: Abnormal   Collection Time: 04/22/21 10:18 AM  Result Value Ref Range Status   Enterococcus faecalis NOT DETECTED NOT DETECTED Final   Enterococcus Faecium NOT DETECTED NOT DETECTED Final   Listeria monocytogenes NOT DETECTED NOT DETECTED Final   Staphylococcus species NOT DETECTED NOT DETECTED Final   Staphylococcus aureus (BCID) NOT DETECTED NOT DETECTED Final   Staphylococcus epidermidis NOT DETECTED NOT DETECTED Final   Staphylococcus lugdunensis NOT DETECTED NOT DETECTED Final   Streptococcus species NOT DETECTED  NOT DETECTED Final   Streptococcus agalactiae NOT DETECTED NOT DETECTED Final   Streptococcus pneumoniae NOT DETECTED NOT DETECTED Final   Streptococcus pyogenes NOT DETECTED NOT DETECTED Final   A.calcoaceticus-baumannii NOT DETECTED NOT DETECTED Final   Bacteroides fragilis NOT DETECTED NOT DETECTED Final   Enterobacterales DETECTED (A) NOT DETECTED Final    Comment: Enterobacterales represent a large order of gram negative bacteria, not a single organism. CRITICAL RESULT CALLED TO, READ BACK BY AND VERIFIED WITH: JASON ROBBINS @ [phone] ON 04/23/2021.Marland KitchenMarland KitchenTKR    Enterobacter cloacae complex NOT DETECTED NOT DETECTED Final   Escherichia coli DETECTED (A) NOT DETECTED  Final    Comment: CRITICAL RESULT CALLED TO, READ BACK BY AND VERIFIED WITH: JASON ROBBINS @ 74 ON 04/23/2021.Marland KitchenMarland KitchenTKR    Klebsiella aerogenes NOT DETECTED NOT DETECTED Final   Klebsiella oxytoca NOT DETECTED NOT DETECTED Final   Klebsiella pneumoniae NOT DETECTED NOT DETECTED Final   Proteus species NOT DETECTED NOT DETECTED Final   Salmonella species NOT DETECTED NOT DETECTED Final   Serratia marcescens NOT DETECTED NOT DETECTED Final   Haemophilus influenzae NOT DETECTED NOT DETECTED Final   Neisseria meningitidis NOT DETECTED NOT DETECTED Final   Pseudomonas aeruginosa NOT DETECTED NOT DETECTED Final   Stenotrophomonas maltophilia NOT DETECTED NOT DETECTED Final   Candida albicans NOT DETECTED NOT DETECTED Final   Candida auris NOT DETECTED NOT DETECTED Final   Candida glabrata NOT DETECTED NOT DETECTED Final   Candida krusei NOT DETECTED NOT DETECTED Final   Candida parapsilosis NOT DETECTED NOT DETECTED Final   Candida tropicalis NOT DETECTED NOT DETECTED Final   Cryptococcus neoformans/gattii NOT DETECTED NOT DETECTED Final   CTX-M ESBL NOT DETECTED NOT DETECTED Final   Carbapenem resistance IMP NOT DETECTED NOT DETECTED Final   Carbapenem resistance KPC NOT DETECTED NOT DETECTED Final   Carbapenem resistance NDM NOT  DETECTED NOT DETECTED Final   Carbapenem resist OXA 48 LIKE NOT DETECTED NOT DETECTED Final   Carbapenem resistance VIM NOT DETECTED NOT DETECTED Final    Comment: Performed at [facility], [address] [place], [place] [address]  Blood Culture ID Panel (Reflexed)     Status: Abnormal   Collection Time: 04/22/21 10:18 AM  Result Value Ref Range Status   Enterococcus faecalis NOT DETECTED NOT DETECTED Final   Enterococcus Faecium NOT DETECTED NOT DETECTED Final   Listeria monocytogenes NOT DETECTED NOT DETECTED Final   Staphylococcus species DETECTED (A) NOT DETECTED Final    Comment: CRITICAL RESULT CALLED TO, READ BACK BY AND VERIFIED WITH: D,MITCHELL PHARMD@1048  04/23/21 EB    Staphylococcus aureus (BCID) NOT DETECTED NOT DETECTED Final   Staphylococcus epidermidis NOT DETECTED NOT DETECTED Final   Staphylococcus lugdunensis NOT DETECTED NOT DETECTED Final   Streptococcus species NOT DETECTED NOT DETECTED Final   Streptococcus agalactiae NOT DETECTED NOT DETECTED Final   Streptococcus pneumoniae NOT DETECTED NOT DETECTED Final   Streptococcus pyogenes NOT DETECTED NOT DETECTED Final   A.calcoaceticus-baumannii NOT DETECTED NOT DETECTED Final   Bacteroides fragilis NOT DETECTED NOT DETECTED Final   Enterobacterales DETECTED (A) NOT DETECTED Final    Comment: Enterobacterales represent a large order of gram negative bacteria, not a single organism. CRITICAL RESULT CALLED TO, READ BACK BY AND VERIFIED WITH: D,MITCHELL PHARMD@1048  04/23/21 EB    Enterobacter cloacae complex NOT DETECTED NOT DETECTED Final   Escherichia coli DETECTED (A) NOT DETECTED Final    Comment: CRITICAL RESULT CALLED TO, READ BACK BY AND VERIFIED WITH: D,MITCHELL PHARMD@1048  04/23/21 EB    Klebsiella aerogenes NOT DETECTED NOT DETECTED Final   Klebsiella oxytoca NOT DETECTED NOT DETECTED Final   Klebsiella pneumoniae NOT DETECTED NOT DETECTED Final   Proteus species NOT DETECTED NOT DETECTED Final    Salmonella species NOT DETECTED NOT DETECTED Final   Serratia marcescens NOT DETECTED NOT DETECTED Final   Haemophilus influenzae NOT DETECTED NOT DETECTED Final   Neisseria meningitidis NOT DETECTED NOT DETECTED Final   Pseudomonas aeruginosa NOT DETECTED NOT DETECTED Final   Stenotrophomonas maltophilia NOT DETECTED NOT DETECTED Final   Candida albicans NOT DETECTED NOT DETECTED Final   Candida auris NOT DETECTED NOT DETECTED  Final   Candida glabrata NOT DETECTED NOT DETECTED Final   Candida krusei NOT DETECTED NOT DETECTED Final   Candida parapsilosis NOT DETECTED NOT DETECTED Final   Candida tropicalis NOT DETECTED NOT DETECTED Final   Cryptococcus neoformans/gattii NOT DETECTED NOT DETECTED Final   CTX-M ESBL NOT DETECTED NOT DETECTED Final   Carbapenem resistance IMP NOT DETECTED NOT DETECTED Final   Carbapenem resistance KPC NOT DETECTED NOT DETECTED Final   Carbapenem resistance NDM NOT DETECTED NOT DETECTED Final   Carbapenem resist OXA 48 LIKE NOT DETECTED NOT DETECTED Final   Carbapenem resistance VIM NOT DETECTED NOT DETECTED Final    Comment: Performed at [facility], [place] [address] [place], [place] [address]  Resp Panel by RT-PCR (Flu A&B, Covid) Nasopharyngeal Swab     Status: None   Collection Time: 04/22/21  5:07 PM   Specimen: Nasopharyngeal Swab; Nasopharyngeal(NP) swabs in vial transport medium  Result Value Ref Range Status   SARS Coronavirus 2 by RT PCR NEGATIVE NEGATIVE Final    Comment: (NOTE) SARS-CoV-2 target nucleic acids are NOT DETECTED.  The SARS-CoV-2 RNA is generally detectable in upper respiratory specimens during the acute phase of infection. The lowest concentration of SARS-CoV-2 viral copies this assay can detect is 138 copies/mL. A negative result does not preclude SARS-Cov-2 infection and should not be used as the sole basis for treatment or other patient management decisions. A negative result may occur with  improper specimen  collection/handling, submission of specimen other than nasopharyngeal swab, presence of viral mutation(s) within the areas targeted by this assay, and inadequate number of viral copies(<138 copies/mL). A negative result must be combined with clinical observations, patient history, and epidemiological information. The expected result is Negative.  Fact Sheet for Patients:  EntrepreneurPulse.com.au  Fact Sheet for Healthcare Providers:  IncredibleEmployment.be  This test is no t yet approved or cleared by the [place] FDA and  has been authorized for detection and/or diagnosis of SARS-CoV-2 by FDA under an Emergency Use Authorization (EUA). This EUA will remain  in effect (meaning this test can be used) for the duration of the COVID-19 declaration under Section 564(b)(1) of the Act, 21 U.S.C.section 360bbb-3(b)(1), unless the authorization is terminated  or revoked sooner.       Influenza A by PCR NEGATIVE NEGATIVE Final   Influenza B by PCR NEGATIVE NEGATIVE Final    Comment: (NOTE) The Xpert Xpress SARS-CoV-2/FLU/RSV plus assay is intended as an aid in the diagnosis of influenza from Nasopharyngeal swab specimens and should not be used as a sole basis for treatment. Nasal washings and aspirates are unacceptable for Xpert Xpress SARS-CoV-2/FLU/RSV testing.  Fact Sheet for Patients: EntrepreneurPulse.com.au  Fact Sheet for Healthcare Providers: IncredibleEmployment.be  This test is not yet approved or cleared by the [place] FDA and has been authorized for detection and/or diagnosis of SARS-CoV-2 by FDA under an Emergency Use Authorization (EUA). This EUA will remain in effect (meaning this test can be used) for the duration of the COVID-19 declaration under Section 564(b)(1) of the Act, 21 U.S.C. section 360bbb-3(b)(1), unless the authorization is terminated or revoked.  Performed at [facility], [address] [place], [place] [address]   Surgical PCR screen     Status: None   Collection Time: 04/22/21  5:10 PM   Specimen: Nasal Mucosa; Nasal Swab  Result Value Ref Range Status   MRSA, PCR NEGATIVE NEGATIVE Final   Staphylococcus aureus NEGATIVE NEGATIVE Final    Comment: (NOTE)  The Xpert SA Assay (FDA approved for NASAL specimens in patients 73 years of age and older), is one component of a comprehensive surveillance program. It is not intended to diagnose infection nor to guide or monitor treatment. Performed at [facility], [address] [place], [place] [address]   MRSA Next Gen by PCR, Nasal     Status: None   Collection Time: 04/22/21  6:42 PM   Specimen: Nasal Mucosa; Nasal Swab  Result Value Ref Range Status   MRSA by PCR Next Gen NOT DETECTED NOT DETECTED Final    Comment: (NOTE) The GeneXpert MRSA Assay (FDA approved for NASAL specimens only), is one component of a comprehensive MRSA colonization surveillance program. It is not intended to diagnose MRSA infection nor to guide or monitor treatment for MRSA infections. Test performance is not FDA approved in patients less than 71 years old. Performed at [facility], [address] [place], [place] [address]   CULTURE, BLOOD (ROUTINE X 2) w Reflex to ID Panel     Status: None   Collection Time: 04/22/21  6:48 PM   Specimen: BLOOD  Result Value Ref Range Status   Specimen Description BLOOD BLOOD RIGHT HAND  Final   Special Requests   Final    BOTTLES DRAWN AEROBIC AND ANAEROBIC Blood Culture adequate volume   Culture   Final    NO GROWTH 5 DAYS Performed at [facility], [address] [place], [place] [address]    Report Status 04/27/2021 FINAL  Final  CULTURE, BLOOD (ROUTINE X 2) w Reflex to ID Panel     Status: None   Collection Time: 04/22/21  7:03 PM   Specimen: BLOOD  Result Value Ref Range Status   Specimen Description BLOOD BLOOD LEFT HAND  Final    Special Requests   Final    BOTTLES DRAWN AEROBIC AND ANAEROBIC Blood Culture adequate volume   Culture   Final    NO GROWTH 5 DAYS Performed at [facility], [address] [place], [place] [address]    Report Status 04/27/2021 FINAL  Final    Coagulation Studies: No results for input(s): LABPROT, INR in the last 72 hours.  Urinalysis: No results for input(s): COLORURINE, LABSPEC, PHURINE, GLUCOSEU, HGBUR, BILIRUBINUR, KETONESUR, PROTEINUR, UROBILINOGEN, NITRITE, LEUKOCYTESUR in the last 72 hours.  Invalid input(s): APPERANCEUR     Imaging: No results found.   Medications:    sodium chloride 75 mL/hr at 04/27/21 1227    ceFAZolin (ANCEF) IV       amLODipine  5 mg Oral Daily   cephALEXin  500 mg Oral Q12H   feeding supplement  237 mL Oral BID BM   heparin  5,000 Units Subcutaneous Q8H   multivitamin with minerals  1 tablet Oral Daily   nicotine  14 mg Transdermal Daily   traZODone  50 mg Oral QHS   morphine injection, oxyCODONE, polyethylene glycol  Assessment/ Plan:  Ms. Lynn Norton is a 71 y.o.  female with past medical conditions including arthritis, hematuria, hypertension, OA, RA, hepatitis, and osteoporosis, who was admitted to [facility] on 04/22/2021 for Weakness [R53.1] Fall, initial encounter [W19.XXXA] Elbow fracture, right, closed, initial encounter [S42.401A] Sepsis ([place]) [A41.9] Urinary tract infection without hematuria, site unspecified [N39.0] Sepsis, due to unspecified organism, unspecified whether acute organ dysfunction present ([place]) [A41.9]   Acute kidney injury likely secondary to progression of urinary tract infection.  Normal renal function July 2022.  Renal ultrasound negative for obstruction.  No IV contrast exposure.    Creatinine increased today, Currently NPO for procedure. Will prescribe normal saline @75ml /hr for hydration. Will monitor renal function.  Lab Results  Component Value Date   CREATININE 2.26 (H) 04/27/2021    CREATININE 1.85 (H) 04/26/2021   CREATININE 2.17 (H) 04/25/2021    Intake/Output Summary (Last 24 hours) at 04/27/2021 1445 Last data filed at 04/27/2021 1330 Gross per 24 hour  Intake --  Output 1275 ml  Net -1275 ml    2. Hypertension .  Blood pressure elevated overnight, given one-time dose IV labetalol 10 mg.    Blood pressure 140/62, amlodipine 5 mg daily restarted.   3.  Sepsis secondary to UTI.  UA suggestive of UTI, currently awaiting urine culture.  IV antibiotics course completed.  Blood culture positive for E. coli from suspected urinary tract infection.  4. Anemia of chronic disease Hemoglobin remains within acceptable range  5.Hyponatremia Sodium stable   LOS: 5   3/6/20232:45 PM

## 2021-04-27 NOTE — Plan of Care (Signed)

## 2021-04-27 NOTE — Progress Notes (Signed)
?Progress Note ? ? ?Patient: Lynn Norton GMW:[id number] DOB: 1951-02-05 DOA: 04/22/2021     5 ?DOS: the patient was seen and examined on 04/27/2021 ?  ?Brief hospital course: ?Ahnya Akre is a 71 y.o. female with history of rheumatoid arthritis who presented from home with complaints of multiple falls over last few days prior to admission and pain in the right elbow. Patient lives with her husband in a house.  She ambulates with help of a cane.   ?She had become progressively more weak and unsteady resulting in falls at home. ?X-ray on admission showed a right nondisplaced mildly comminuted fracture through the right trochlea. ?Orthopedic surgery was consulted and she was placed in a sling with plans for operative repair. ?Her work-up was also notable for evidence of a UTI with complication of bacteremia as well.  Surgery was delayed while infection was treated.  She also developed an AKI for which nephrology was also consulted. ? ?Assessment and Plan: ?* Sepsis secondary to UTI (HCC)-resolved as of 04/26/2021 ?Presented with elevated leukocytes, elevated procalcitonin, AKI.  Source is UTI.   ?-Continue aztreonam ? ?Closed fracture dislocation of right elbow ?Presented with fall from home.  Complained of right elbow pain on presentation.  X-ray of the right elbow showed nondisplaced mildly comminuted fracture of right trochlea.  Currently in a sling.  Orthopedics consulted and following.   ?-ORIF on Monday, 3/6. Follow up after surgery  ? ?Bacteremia due to Escherichia coli ?- Suspected translocation from urinary source.  Blood cultures noted with E. Coli ?-Treated with aztreonam due to allergy profile initially ?-Discussed with pharmacy on 04/26/2021, will transition to Keflex to complete total of 10-day course ?- Repeat cultures have been negative ? ?Frequent falls ?Falling several times at home.  History of arthritis.  Ambulates with the help of cane.  Lives with husband.  PT/OT consulted, recommended skilled  nursing facility on discharge ? ?AKI (acute kidney injury) ([place]) ?- No known history of CKD; no prior labs for comparison ?-Creatinine 2.1 on admission ?- Renal ultrasound negative for hydronephrosis or obstruction ?- Nephrology following ?- Now off fluids and tolerating diet well ?- BMP daily; creatinine improving slowly; some increase today, unsure significance or validity.  Repeat BMP this evening and resume IVF for now (per nephrology) ? ?HTN (hypertension) ?- BP meds initially held on admission ?- Amlodipine resumed, blood pressure better controlled ? ?Rheumatoid arthritis ([place]) ?Takes methotrexate and prednisone at home.  Currently on hold ? ?Tobacco use ?Smokes 20 cigarettes a day.  Counseled for cessation.  Offered nicotine patch ? ?Hyponatremia-resolved as of 04/26/2021 ?Thought to be secondary to hypovolemic hyponatremia.  Improving .Marland Kitchen  Monitor BMP ? ? ? ? ?Subjective: No events overnight. Ready for surgery later today. Feeling about the same otherwise.  ? ?Physical Exam: ?Vitals:  ? 04/27/21 0418 04/27/21 0418 04/27/21 0825 04/27/21 1156  ?BP:  (!) 178/90 138/66 140/62  ?Pulse:  (!) 102 89 83  ?Resp:  16 18 17   ?Temp: 98.5 ?F (36.9 ?C)  98 ?F (36.7 ?C) 97.7 ?F (36.5 ?C)  ?TempSrc: Oral  Oral Oral  ?SpO2:  97% 96% 97%  ?Weight:      ?Height:      ? ?Physical Exam ?Constitutional:   ?   Appearance: Normal appearance.  ?HENT:  ?   Head: Normocephalic and atraumatic.  ?   Mouth/Throat:  ?   Mouth: Mucous membranes are moist.  ?Eyes:  ?   Extraocular Movements: Extraocular movements intact.  ?Cardiovascular:  ?  Rate and Rhythm: Normal rate and regular rhythm.  ?Pulmonary:  ?   Effort: Pulmonary effort is normal.  ?   Breath sounds: Normal breath sounds.  ?Abdominal:  ?   General: Bowel sounds are normal. There is no distension.  ?   Palpations: Abdomen is soft.  ?   Tenderness: There is no abdominal tenderness.  ?Musculoskeletal:  ?   Cervical back: Normal range of motion and neck supple.  ?   Comments: Right  upper extremity in sling with dressing noted underneath.  Right hand is edematous approximately 3+  ?Skin: ?   General: Skin is warm and dry.  ?Neurological:  ?   General: No focal deficit present.  ?   Mental Status: She is alert.  ?Psychiatric:     ?   Mood and Affect: Mood normal.     ?   Behavior: Behavior normal.  ? ? ? ?Data Reviewed: ? ?Results for orders placed or performed during the hospital encounter of 04/22/21 (from the past 24 hour(s))  ?Basic metabolic panel     Status: Abnormal  ? Collection Time: 04/27/21  9:59 AM  ?Result Value Ref Range  ? Sodium 134 (L) 135 - 145 mmol/L  ? Potassium 5.2 (H) 3.5 - 5.1 mmol/L  ? Chloride 104 98 - 111 mmol/L  ? CO2 24 22 - 32 mmol/L  ? Glucose, Bld 101 (H) 70 - 99 mg/dL  ? BUN 47 (H) 8 - 23 mg/dL  ? Creatinine, Ser 2.26 (H) 0.44 - 1.00 mg/dL  ? Calcium 8.2 (L) 8.9 - 10.3 mg/dL  ? GFR, Estimated 22 (L) >60 mL/min  ? Anion gap 6 5 - 15  ?  ?I have Reviewed nursing notes, Vitals, and Lab results since pt's last encounter. Pertinent lab results : see above ?I have ordered test including BMP, CBC, Mg ?I have reviewed the last note from staff over past 24 hours ?I have discussed pt's care plan and test results with nursing staff, case manager ? ? ?Family Communication:  ? ?Disposition: ?Status is: Inpatient ?Remains inpatient appropriate because: Treatment as outlined in A&P ? ? Planned Discharge Destination: Skilled nursing facility ? ?Antimicrobials: ?Aztreonam 04/23/2021 >>04/26/21 ?Keflex 04/26/21 >> current  ? ?Consultants: ?Orthopedic surgery ?Nephrology ? ?Procedures:  ? ? ?DVT ppx:  ?heparin injection 5,000 Units Start: 04/22/21 1400 ? ? ?  Code Status: Full Code  ? ? ?Author: ?Dwyane Dee, MD ?04/27/2021 2:12 PM ? ?For on call review [email or web address].  ? ?

## 2021-04-27 NOTE — Anesthesia Preprocedure Evaluation (Signed)
Anesthesia Evaluation  ?Patient identified by MRN, date of birth, ID band ?Patient awake ? ? ? ?Reviewed: ?Allergy & Precautions, H&P , NPO status , Patient's Chart, lab work & pertinent test results, reviewed documented beta blocker date and time  ? ?History of Anesthesia Complications ?Negative for: history of anesthetic complications ? ?Airway ?Mallampati: III ? ?TM Distance: >3 FB ?Neck ROM: full ? ? ? Dental ? ?(+) Dental Advidsory Given, Teeth Intact ?  ?Pulmonary ?neg pulmonary ROS, neg shortness of breath, neg COPD, neg recent URI, Current Smoker and Patient abstained from smoking.,  ?  ?Pulmonary exam normal ?breath sounds clear to auscultation ? ? ? ? ? ? Cardiovascular ?Exercise Tolerance: Good ?hypertension, (-) angina(-) Past MI and (-) Cardiac Stents Normal cardiovascular exam(-) dysrhythmias (-) Valvular Problems/Murmurs ?Rhythm:regular Rate:Normal ? ? ?  ?Neuro/Psych ?negative neurological ROS ? negative psych ROS  ? GI/Hepatic ?negative GI ROS, Neg liver ROS,   ?Endo/Other  ?negative endocrine ROS ? Renal/GU ?negative Renal ROS  ?negative genitourinary ?  ?Musculoskeletal ? ? Abdominal ?  ?Peds ? Hematology ?negative hematology ROS ?(+)   ?Anesthesia Other Findings ?Past Medical History: ?No date: Rheumatoid Arthritis ? ? Reproductive/Obstetrics ?negative OB ROS ? ?  ? ? ? ? ? ? ? ? ? ? ? ? ? ?  ?  ? ? ? ? ? ? ? ? ?Anesthesia Physical ?Anesthesia Plan ? ?ASA: 3 ? ?Anesthesia Plan: General  ? ?Post-op Pain Management:   ? ?Induction: Intravenous ? ?PONV Risk Score and Plan: 2 and Ondansetron, Dexamethasone and Treatment may vary due to age or medical condition ? ?Airway Management Planned: LMA and Oral ETT ? ?Additional Equipment:  ? ?Intra-op Plan:  ? ?Post-operative Plan: Extubation in OR ? ?Informed Consent: I have reviewed the patients History and Physical, chart, labs and discussed the procedure including the risks, benefits and alternatives for the proposed  anesthesia with the patient or authorized representative who has indicated his/her understanding and acceptance.  ? ? ? ?Dental Advisory Given ? ?Plan Discussed with: Anesthesiologist, CRNA and Surgeon ? ?Anesthesia Plan Comments:   ? ? ? ? ? ? ?Anesthesia Quick Evaluation ? ?

## 2021-04-27 NOTE — Anesthesia Procedure Notes (Signed)
Procedure Name: LMA Insertion ?Date/Time: 04/27/2021 3:41 PM ?Performed by: Aline Brochure, CRNA ?Pre-anesthesia Checklist: Patient identified, Patient being monitored, Timeout performed, Emergency Drugs available and Suction available ?Patient Re-evaluated:Patient Re-evaluated prior to induction ?Oxygen Delivery Method: Circle system utilized ?Preoxygenation: Pre-oxygenation with 100% oxygen ?Induction Type: IV induction ?Ventilation: Mask ventilation without difficulty ?LMA: LMA inserted ?LMA Size: 3.5 ?Tube type: Oral ?Number of attempts: 1 ?Placement Confirmation: positive ETCO2 and breath sounds checked- equal and bilateral ?Tube secured with: Tape ?Dental Injury: Teeth and Oropharynx as per pre-operative assessment  ? ? ? ? ?

## 2021-04-28 ENCOUNTER — Encounter: Payer: Self-pay | Admitting: Orthopedic Surgery

## 2021-04-28 DIAGNOSIS — Z720 Tobacco use: Secondary | ICD-10-CM

## 2021-04-28 DIAGNOSIS — I10 Essential (primary) hypertension: Secondary | ICD-10-CM

## 2021-04-28 LAB — PROTEIN ELECTRO, RANDOM URINE
Albumin ELP, Urine: 32.1 %
Alpha-1-Globulin, U: 5.4 %
Alpha-2-Globulin, U: 17.9 %
Beta Globulin, U: 30.8 %
Gamma Globulin, U: 13.8 %
Total Protein, Urine: 47 mg/dL

## 2021-04-28 LAB — CBC WITH DIFFERENTIAL/PLATELET
Abs Immature Granulocytes: 0.07 10*3/uL (ref 0.00–0.07)
Basophils Absolute: 0 10*3/uL (ref 0.0–0.1)
Basophils Relative: 0 %
Eosinophils Absolute: 0 10*3/uL (ref 0.0–0.5)
Eosinophils Relative: 0 %
HCT: 31 % — ABNORMAL LOW (ref 36.0–46.0)
Hemoglobin: 10.8 g/dL — ABNORMAL LOW (ref 12.0–15.0)
Immature Granulocytes: 1 %
Lymphocytes Relative: 4 %
Lymphs Abs: 0.4 10*3/uL — ABNORMAL LOW (ref 0.7–4.0)
MCH: 34.2 pg — ABNORMAL HIGH (ref 26.0–34.0)
MCHC: 34.8 g/dL (ref 30.0–36.0)
MCV: 98.1 fL (ref 80.0–100.0)
Monocytes Absolute: 0.3 10*3/uL (ref 0.1–1.0)
Monocytes Relative: 3 %
Neutro Abs: 9.3 10*3/uL — ABNORMAL HIGH (ref 1.7–7.7)
Neutrophils Relative %: 92 %
Platelets: 567 10*3/uL — ABNORMAL HIGH (ref 150–400)
RBC: 3.16 MIL/uL — ABNORMAL LOW (ref 3.87–5.11)
RDW: 14.6 % (ref 11.5–15.5)
WBC: 10 10*3/uL (ref 4.0–10.5)
nRBC: 0 % (ref 0.0–0.2)

## 2021-04-28 LAB — BASIC METABOLIC PANEL
Anion gap: 9 (ref 5–15)
BUN: 50 mg/dL — ABNORMAL HIGH (ref 8–23)
CO2: 23 mmol/L (ref 22–32)
Calcium: 7.7 mg/dL — ABNORMAL LOW (ref 8.9–10.3)
Chloride: 102 mmol/L (ref 98–111)
Creatinine, Ser: 2.14 mg/dL — ABNORMAL HIGH (ref 0.44–1.00)
GFR, Estimated: 24 mL/min — ABNORMAL LOW (ref >60)
Glucose, Bld: 204 mg/dL — ABNORMAL HIGH (ref 70–99)
Potassium: 5 mmol/L (ref 3.5–5.1)
Sodium: 134 mmol/L — ABNORMAL LOW (ref 135–145)

## 2021-04-28 LAB — MAGNESIUM: Magnesium: 2 mg/dL (ref 1.7–2.4)

## 2021-04-28 NOTE — NC FL2 (Signed)
?[place] MEDICAID FL2 LEVEL OF CARE SCREENING TOOL  ?  ? ?IDENTIFICATION  ?Patient Name: ?Lynn Norton Birthdate: 03/12/1950 Sex: female Admission Date (Current Location): ?04/22/2021  ?[place] and [place] Number: ? [place] ?  Facility and Address:  ?[facility], [address] [place], [place] [address] ?     Provider Number: ?[id number]  ?Attending Physician Name and Address:  ?Fritzi Mandes, MD ? Relative Name and Phone Number:  ?Elenore Rota [phone] ?   ?Current Level of Care: ?Hospital Recommended Level of Care: ?[place] Prior Approval Number: ?  ? ?Date Approved/Denied: ?  PASRR Number: ?[id number] ? ?Discharge Plan: ?SNF ?  ? ?Current Diagnoses: ?Patient Active Problem List  ? Diagnosis Date Noted  ? HTN (hypertension) 04/26/2021  ? Rheumatoid arthritis ([place]) 04/24/2021  ? Tobacco use 04/24/2021  ? Bacteremia due to Escherichia coli 04/23/2021  ? Closed fracture dislocation of right elbow 04/22/2021  ? AKI (acute kidney injury) ([place]) 04/22/2021  ? Frequent falls 04/22/2021  ? ? ?Orientation RESPIRATION BLADDER Height & Weight   ?  ?Self, Time, Situation, Place ? Normal Continent Weight: 46.1 kg ?Height:  5\' 1"  (154.9 cm)  ?BEHAVIORAL SYMPTOMS/MOOD NEUROLOGICAL BOWEL NUTRITION STATUS  ?    Continent  (see dc summary)  ?AMBULATORY STATUS COMMUNICATION OF NEEDS Skin   ?Extensive Assist Verbally Normal, Surgical wounds ?  ?  ?  ?    ?     ?     ? ? ?Personal Care Assistance Level of Assistance  ?Bathing, Feeding, Dressing, Total care Bathing Assistance: Limited assistance ?Feeding assistance: Limited assistance ?Dressing Assistance: Limited assistance ?Total Care Assistance: Limited assistance  ? ?Functional Limitations Info  ?    ?  ?   ? ? ?SPECIAL CARE FACTORS FREQUENCY  ?PT (By licensed PT), OT (By licensed OT)   ?  ?PT Frequency: 5 times per week ?OT Frequency: 5 times  per week ?  ?  ?  ?   ? ? ?Contractures Contractures Info: Not present  ? ? ?Additional Factors  Info  ?Code Status, Allergies Code Status Info: full code ?Allergies Info: Rocephin (Ceftriaxone), Sulfa Antibiotics, Penicillins ?  ?  ?  ?   ? ?Current Medications (04/28/2021):  This is the current hospital active medication list ?Current Facility-Administered Medications  ?Medication Dose Route Frequency Provider Last Rate Last Admin  ? 0.9 %  sodium chloride infusion   Intravenous Continuous Hessie Knows, MD 75 mL/hr at 04/27/21 1748 Infusion Verify at 04/27/21 1748  ? amLODipine (NORVASC) tablet 5 mg  5 mg Oral Daily Hessie Knows, MD   5 mg at 04/28/21 8413  ? cephALEXin (KEFLEX) capsule 500 mg  500 mg Oral Q12H Hessie Knows, MD   500 mg at 04/28/21 2440  ? feeding supplement (ENSURE ENLIVE / ENSURE PLUS) liquid 237 mL  237 mL Oral BID BM Hessie Knows, MD   237 mL at 04/28/21 0844  ? heparin injection 5,000 Units  5,000 Units Subcutaneous Q8H Hessie Knows, MD   5,000 Units at 04/28/21 0524  ? morphine (PF) 2 MG/ML injection 2 mg  2 mg Intravenous Q4H PRN Hessie Knows, MD   2 mg at 04/23/21 0606  ? multivitamin with minerals tablet 1 tablet  1 tablet Oral Daily Hessie Knows, MD   1 tablet at 04/28/21 0844  ? nicotine (NICODERM CQ - dosed in mg/24 hours) patch 14 mg  14 mg Transdermal Daily Hessie Knows, MD   14 mg at 04/28/21 0844  ?  oxyCODONE (Oxy IR/ROXICODONE) immediate release tablet 5 mg  5 mg Oral Q6H PRN Hessie Knows, MD   5 mg at 04/28/21 0524  ? polyethylene glycol (MIRALAX / GLYCOLAX) packet 17 g  17 g Oral Daily PRN Hessie Knows, MD   17 g at 04/25/21 2220  ? traZODone (DESYREL) tablet 50 mg  50 mg Oral QHS Hessie Knows, MD   50 mg at 04/27/21 2113  ? ? ? ?Discharge Medications: ?Please see discharge summary for a list of discharge medications. ? ?Relevant Imaging Results: ? ?Relevant Lab Results: ? ? ?Additional Information ?[id number] ? ?Conception Oms, RN ? ? ? ? ?

## 2021-04-28 NOTE — Progress Notes (Signed)
? ?  Subjective: ?1 Day Post-Op Procedure(s) (LRB): ?OPEN REDUCTION INTERNAL FIXATION (ORIF) ELBOW/OLECRANON FRACTURE (Right) ?Patient reports pain as mild.   ?Patient is well, and has had no acute complaints or problems ?Denies any CP, SOB, ABD pain. ?We will continue therapy today.  ? ? ?Objective: ?Vital signs in last 24 hours: ?Temp:  [97 ?F (36.1 ?C)-98 ?F (36.7 ?C)] 97.7 ?F (36.5 ?C) (03/07 0804) ?Pulse Rate:  [73-94] 73 (03/07 0804) ?Resp:  [14-24] 16 (03/07 0804) ?BP: (111-148)/(61-90) 134/68 (03/07 0804) ?SpO2:  [91 %-100 %] 100 % (03/07 0804) ?Weight:  [46.1 kg] 46.1 kg (03/06 1501) ? ?Intake/Output from previous day: ?03/06 0701 - 03/07 0700 ?In: 636.6 [P.O.:240; I.V.:346.6; IV Piggyback:50] ?Out: 9211 [Urine:1825] ?Intake/Output this shift: ?No intake/output data recorded. ? ?Recent Labs  ?  04/26/21 ?0802 04/28/21 ?0310  ?HGB 11.3* 10.8*  ? ?Recent Labs  ?  04/26/21 ?0802 04/28/21 ?0310  ?WBC 16.6* 10.0  ?RBC 3.30* 3.16*  ?HCT 32.4* 31.0*  ?PLT 463* 567*  ? ?Recent Labs  ?  04/27/21 ?2004 04/28/21 ?0310  ?NA 134* 134*  ?K 5.6* 5.0  ?CL 102 102  ?CO2 23 23  ?BUN 45* 50*  ?CREATININE 2.23* 2.14*  ?GLUCOSE 170* 204*  ?CALCIUM 8.1* 7.7*  ? ?No results for input(s): LABPT, INR in the last 72 hours. ? ?EXAM ?General - Patient is Alert, Appropriate, and Oriented ?Extremity - Neurovascular intact ?Sensation intact distally ?Intact pulses distally ?Dorsiflexion/Plantar flexion intact ?No cellulitis present ?Compartment soft ?Mild swelling throughout digits ?Dressing - dressing C/D/I and no drainage, splint and sling intact ?Motor Function - intact, moving digits well on exam.  ? ?Past Medical History:  ?Diagnosis Date  ? Arthritis   ? ? ?Assessment/Plan:   ?1 Day Post-Op Procedure(s) (LRB): ?OPEN REDUCTION INTERNAL FIXATION (ORIF) ELBOW/OLECRANON FRACTURE (Right) ?Active Problems: ?  Closed fracture dislocation of right elbow ?  AKI (acute kidney injury) ([place]) ?  Frequent falls ?  Bacteremia due to Escherichia  coli ?  Rheumatoid arthritis ([place]) ?  Tobacco use ?  HTN (hypertension) ? ?Estimated body mass index is 19.2 kg/m? as calculated from the following: ?  Height as of this encounter: 5\' 1"  (1.549 m). ?  Weight as of this encounter: 46.1 kg. ?Advance diet ?Up with therapy, NWB RUE ?Follow up [facility] 05/11/2021 for wound check/suture removal ?Keep splint clean and dry ? ?T. Rachelle Hora, PA-C ?[place] ?04/28/2021, 11:30 AM ?  ?

## 2021-04-28 NOTE — Anesthesia Postprocedure Evaluation (Signed)
Anesthesia Post Note ? ?Patient: [name] ? ?Procedure(s) Performed: OPEN REDUCTION INTERNAL FIXATION (ORIF) ELBOW/OLECRANON FRACTURE (Right: Elbow) ? ?Patient location during evaluation: PACU ?Anesthesia Type: General ?Level of consciousness: awake and alert ?Pain management: pain level controlled ?Vital Signs Assessment: post-procedure vital signs reviewed and stable ?Respiratory status: spontaneous breathing, nonlabored ventilation, respiratory function stable and patient connected to nasal cannula oxygen ?Cardiovascular status: blood pressure returned to baseline and stable ?Postop Assessment: no apparent nausea or vomiting ?Anesthetic complications: no ? ? ?No notable events documented. ? ? ?Last Vitals:  ?Vitals:  ? 04/28/21 0804 04/28/21 1201  ?BP: 134/68 (!) 115/57  ?Pulse: 73 79  ?Resp: 16 18  ?Temp: 36.5 ?C 36.5 ?C  ?SpO2: 100% 92%  ?  ?Last Pain:  ?Vitals:  ? 04/28/21 0715  ?TempSrc:   ?PainSc: Asleep  ? ? ?  ?  ?  ?  ?  ?  ? ?Martha Clan ? ? ? ? ?

## 2021-04-28 NOTE — Evaluation (Signed)
Occupational Therapy Evaluation Patient Details Name: Lynn Norton MRN: [id number] DOB: 28-Jan-1951 Today's Date: 04/28/2021   History of Present Illness 71 y.o. female with history of arthritis who presented from home with complaints of multiple falls over last few days, pain on the right elbow.  Pt with R nondisplaced comminuted fx R trochlea and is now s/p R ORIF.   Clinical Impression   Ms. Exline was seen for OT re-evaluation this date. Pt presents s/p ORIF of her RUE with orders to remain NWB with sling on at all times. She remains significantly functionally limited 2/2 decreased functional use of her RUE, poor safety awareness, decreased balance, and decreased activity tolerance. Pt requires MOD-MAX A for ADL management t/o session with increased assist required for 2-handed tasks including LB clothing management from STS, functional transfers, toileting, and eating/feeding. See ADL section below for additional detail regarding occupational performance. Pt has made some progress toward OT goals since initial admission and continues to benefit from skilled OT services. POC updated to reflect current functional needs/deficits. DC recommendation for STR remains appropriate.      Recommendations for follow up therapy are one component of a multi-disciplinary discharge planning process, led by the attending physician.  Recommendations may be updated based on patient status, additional functional criteria and insurance authorization.   Follow Up Recommendations  Skilled nursing-short term rehab (<3 hours/day)    Assistance Recommended at Discharge Frequent or constant Supervision/Assistance  Patient can return home with the following A lot of help with walking and/or transfers;A lot of help with bathing/dressing/bathroom;Direct supervision/assist for medications management;Help with stairs or ramp for entrance;Assist for transportation;Direct supervision/assist for financial management;Assistance  with cooking/housework    Functional Status Assessment  Patient has had a recent decline in their functional status and demonstrates the ability to make significant improvements in function in a reasonable and predictable amount of time.  Equipment Recommendations  Other (comment) (defer to next venue of care)    Recommendations for Other Services       Precautions / Restrictions Precautions Precautions: Fall Required Braces or Orthoses: Sling Restrictions Weight Bearing Restrictions: Yes RUE Weight Bearing: Non weight bearing Other Position/Activity Restrictions: sling to be donned at all times      Mobility Bed Mobility Overal bed mobility: Needs Assistance Bed Mobility: Sit to Supine     Supine to sit: Min assist, HOB elevated          Transfers Overall transfer level: Needs assistance Equipment used: 1 person hand held assist Transfers: Sit to/from Stand Sit to Stand: Min assist, Mod assist, From elevated surface     Step pivot transfers: Mod assist     General transfer comment: Pt noted with significant decreased safety awareness and poor eccentric control during STS attempts t/o session. Requires cueing for safety and sequencing.      Balance Overall balance assessment: Needs assistance Sitting-balance support: Single extremity supported Sitting balance-Leahy Scale: Good Sitting balance - Comments: Steady static sitting, reaching within BOS. Postural control: Posterior lean Standing balance support: During functional activity, Single extremity supported, Reliant on assistive device for balance Standing balance-Leahy Scale: Poor Standing balance comment: Significant sway in standing despite support.                           ADL either performed or assessed with clinical judgement   ADL Overall ADL's : Needs assistance/impaired Eating/Feeding: Sitting;Moderate assistance;Cueing for safety Eating/Feeding Details (indicate cue type and  reason): Pt  with significant difficulty reaching and managing 2-handed meal tray items. Educated on one handed eating strategies and encouraged to order hand-held foods to maximize functional independence. Requires assist to cut food and support with one handed eating strategies during breakfast this date. Grooming: Sitting;Moderate assistance;Minimal assistance;Cueing for safety                   Toilet Transfer: BSC/3in1;Stand-pivot;Minimal assistance;Cueing for sequencing;Cueing for safety   Toileting- Clothing Manipulation and Hygiene: Maximal assistance;Sit to/from stand;Cueing for safety;Cueing for sequencing       Functional mobility during ADLs: Minimal assistance;Cueing for safety;Cueing for sequencing;Moderate assistance General ADL Comments: Increased cueing required to maintain NWB status with RUE during functional tasks. MAX A for LB clothing management after toileting. Pt is able to maintain seated balance on [facility] with supervision for safety. Decreased safety awareness with STS attempts and poor eccentric control during sitting.     Vision Patient Visual Report: No change from baseline       Perception     Praxis      Pertinent Vitals/Pain Pain Assessment Pain Assessment: Faces Faces Pain Scale: Hurts whole lot Pain Location: R UE Pain Descriptors / Indicators: Discomfort, Aching, Grimacing, Guarding Pain Intervention(s): Limited activity within patient's tolerance, Monitored during session, Repositioned     Hand Dominance Right   Extremity/Trunk Assessment Upper Extremity Assessment Upper Extremity Assessment: RUE deficits/detail RUE Deficits / Details: fx- NWB in sling; active digit flexion/extension   Lower Extremity Assessment Lower Extremity Assessment: Generalized weakness       Communication Communication Communication: No difficulties   Cognition Arousal/Alertness: Awake/alert Behavior During Therapy: WFL for tasks assessed/performed Overall  Cognitive Status: No family/caregiver present to determine baseline cognitive functioning                                 General Comments: Remains heavy VC's for maintaining NWB precautions on RUE.     General Comments  SpO2 monitored t/o session remains WFL with cues for PLB.    Exercises Other Exercises Other Exercises: Educated on proper positioning for edema management for RUE. Re-education on RUE precautions during functional activity. Provided with reinforcement of past education on safe transfer techniques and falls prevention t/o session. OT facilitated toilet transfer, toileting, peri hygiene, set-up of breakfast tray and feeding during session. Pt educated on safety and compensatory strategies t/o functional tasks.   Shoulder Instructions      Home Living Family/patient expects to be discharged to:: Private residence Living Arrangements: Spouse/significant other Available Help at Discharge: Family;Available PRN/intermittently (Per pt, spouse has cancer and requires assist himself for ADL management.) Type of Home: House Home Access: Stairs to enter [employer] of Steps: "a few"   Home Layout: One level               Home Equipment: Cane - single point          Prior Functioning/Environment Prior Level of Function : Independent/Modified Independent             Mobility Comments: Using [facility] for functional mobility. hx of multiple falls. ADLs Comments: Pt reports she is mod I for ADL management.        OT Problem List: Decreased strength;Decreased activity tolerance;Decreased range of motion;Impaired balance (sitting and/or standing);Decreased safety awareness;Pain;Decreased cognition;Cardiopulmonary status limiting activity;Impaired UE functional use;Decreased coordination;Decreased knowledge of use of DME or AE;Increased edema      OT Treatment/Interventions: Self-care/ADL training;Therapeutic  exercise;Patient/family  education;Energy conservation;DME and/or AE instruction;Balance training;Therapeutic activities;Cognitive remediation/compensation;Manual therapy;Modalities    OT Goals(Current goals can be found in the care plan section) Acute Rehab OT Goals Patient Stated Goal: To go to rehab OT Goal Formulation: With patient Time For Goal Achievement: 05/12/21 Potential to Achieve Goals: Good ADL Goals Pt Will Perform Grooming: with set-up;with supervision;sitting Pt Will Perform Upper Body Bathing: with set-up;with supervision;sitting Pt Will Perform Upper Body Dressing: sitting;with min assist Pt Will Perform Lower Body Dressing: sit to/from stand;with mod assist Pt Will Transfer to Toilet: with supervision;with set-up;bedside commode;ambulating Pt Will Perform Toileting - Clothing Manipulation and hygiene: sit to/from stand;with min assist  OT Frequency: Min 2X/week    Co-evaluation              AM-PAC OT "6 Clicks" Daily Activity     Outcome Measure Help from another person eating meals?: A Little Help from another person taking care of personal grooming?: A Lot Help from another person toileting, which includes using toliet, bedpan, or urinal?: A Lot Help from another person bathing (including washing, rinsing, drying)?: A Lot Help from another person to put on and taking off regular upper body clothing?: A Lot Help from another person to put on and taking off regular lower body clothing?: A Lot 6 Click Score: 13   End of Session Equipment Utilized During Treatment:  (sling) Nurse Communication: Mobility status  Activity Tolerance: Patient limited by pain Patient left: in bed;with call bell/phone within reach;with nursing/sitter in room  OT Visit Diagnosis: Unsteadiness on feet (R26.81);Repeated falls (R29.6);Muscle weakness (generalized) (M62.81);Pain Pain - Right/Left: Right Pain - part of body: Arm                Time: 5053-9767 OT Time Calculation (min): 49 min Charges:  OT  General Charges $OT Visit: 1 Visit OT Evaluation $OT Re-eval: 1 Re-eval OT Treatments $Self Care/Home Management : 23-37 mins  Shara Blazing, M.S., OTR/L [facility] - [place] Ascom: [phone] 04/28/21, 10:30 AM

## 2021-04-28 NOTE — Evaluation (Signed)
Physical Therapy Evaluation Patient Details Name: Lynn Norton MRN: [id number] DOB: July 24, 1950 Today's Date: 04/28/2021  History of Present Illness  71 y.o. female with history of arthritis who presented from home with complaints of multiple falls over last few days, pain on the right elbow.  Pt with R nondisplaced comminuted fx R trochlea and is now s/p R ORIF on 04/27/21.   Clinical Impression  Pt received upright in recliner, agreeable to PT re-eval after s/p RUE ORIF. Pt remains ISQ with mobility compared to pre-op requiring significant VC's for maintaining NWB on RUE throughout mobility with difficulty in mobility with deficits in balance, bed mobility, gait, maintaining safe WB'ing precautions all affecting safe functional mobility for return home. Pt does appear to ambulate and transfer better with HHA+1 versus use of quad cane which I anticipate due to the motor and cognitive task required for safe and correct QC use to add stability for gait. However with gait in room at EOB pt still displays high risk for falls with poor foot clearance, significant R/L lateral sway and decreased step lengths bilat requiring close minguard to minA with descent onto [facility], bed, and recliner. Pt is able to perform indep perihygiene but requires mod to max VC's for set up and sequencing in prep for transfer and standing but is able to perform standing without physical assist. Pt returned to seated EoB and required modA at LE's to return to supine in bed and maxA to scoot up in bed due to NWB use of RUE. All needs in reach with education on edema management in RUE with RUE propped with pillows to assist in swelling. D/c recs remain appropriate due to deficits mentioned above.      Recommendations for follow up therapy are one component of a multi-disciplinary discharge planning process, led by the attending physician.  Recommendations may be updated based on patient status, additional functional criteria and insurance  authorization.  Follow Up Recommendations Skilled nursing-short term rehab (<3 hours/day)    Assistance Recommended at Discharge Frequent or constant Supervision/Assistance  Patient can return home with the following  A lot of help with bathing/dressing/bathroom;Assistance with cooking/housework;Help with stairs or ramp for entrance;A lot of help with walking and/or transfers;Assist for transportation    Equipment Recommendations Other (comment) (tbd by next venue of care)  Recommendations for Other Services       Functional Status Assessment Patient has had a recent decline in their functional status and demonstrates the ability to make significant improvements in function in a reasonable and predictable amount of time.     Precautions / Restrictions Precautions Precautions: Fall Required Braces or Orthoses: Sling Restrictions Weight Bearing Restrictions: Yes RUE Weight Bearing: Non weight bearing Other Position/Activity Restrictions: sling to be donned at all times      Mobility  Bed Mobility   Bed Mobility: Sit to Supine     Supine to sit: Mod assist     General bed mobility comments: In recliner pre session. Returned to bed. Requires modA at LE's to return to supine in bed and maxA to scoot up in bed due to NWB precautions. Patient Response: Cooperative  Transfers       Sit to Stand: Min guard           General transfer comment: Pt with poor safety awareness requiring mod to max VC's for sequencing and safe use of UE's while maintaining NWB precautions. Remains with poor eccentric control.    Ambulation/Gait Ambulation/Gait assistance: Counsellor (  Feet): 12 Feet Assistive device: 1 person hand held assist Gait Pattern/deviations: Step-to pattern, Decreased stride length, Drifts right/left, Narrow base of support Gait velocity: decreased     General Gait Details: Improved gait with HHA versus QC. Still at high risk for falls.  Stairs             Wheelchair Mobility    Modified Rankin (Stroke Patients Only)       Balance Overall balance assessment: Needs assistance Sitting-balance support: Single extremity supported Sitting balance-Leahy Scale: Good Sitting balance - Comments: Steady static sitting, reaching within BOS.   Standing balance support: During functional activity, Single extremity supported, Reliant on assistive device for balance Standing balance-Leahy Scale: Poor Standing balance comment: Significant sway in standing despite support. Requires LUE support on sturdy object to maintain balance.                             Pertinent Vitals/Pain Pain Assessment Pain Assessment: Faces Faces Pain Scale: Hurts even more Pain Location: R UE Pain Descriptors / Indicators: Discomfort, Aching, Grimacing, Guarding Pain Intervention(s): Limited activity within patient's tolerance, Monitored during session, Repositioned    Home Living Family/patient expects to be discharged to:: Private residence Living Arrangements: Spouse/significant other Available Help at Discharge: Family;Available PRN/intermittently Type of Home: House Home Access: Stairs to enter   [employer] of Steps: "a few"   Home Layout: One level Home Equipment: Cane - single point      Prior Function Prior Level of Function : Independent/Modified Independent             Mobility Comments: Using [employer] for functional mobility. hx of multiple falls. ADLs Comments: Pt reports she is mod I for ADL management.     Hand Dominance   Dominant Hand: Right    Extremity/Trunk Assessment   Upper Extremity Assessment Upper Extremity Assessment: RUE deficits/detail RUE Deficits / Details: fx- NWB in sling; active digit flexion/extension    Lower Extremity Assessment Lower Extremity Assessment: Generalized weakness       Communication   Communication: No difficulties  Cognition Arousal/Alertness:  Awake/alert Behavior During Therapy: WFL for tasks assessed/performed Overall Cognitive Status: No family/caregiver present to determine baseline cognitive functioning                                 General Comments: Remains heavy VC's for maintaining NWB precautions on RUE.        General Comments General comments (skin integrity, edema, etc.): SpO2 monitored t/o session remains WFL with cues for PLB.    Exercises Other Exercises Other Exercises: Role of PT in acute setting, re-education on NWB precautions, d/c recs.   Assessment/Plan    PT Assessment Patient needs continued PT services  PT Problem List Decreased strength;Decreased activity tolerance;Decreased balance;Decreased mobility;Decreased cognition;Decreased knowledge of use of DME;Decreased safety awareness;Decreased knowledge of precautions       PT Treatment Interventions DME instruction;Gait training;Functional mobility training;Therapeutic activities;Therapeutic exercise;Balance training;Neuromuscular re-education    PT Goals (Current goals can be found in the Care Plan section)  Acute Rehab PT Goals Patient Stated Goal: to go home PT Goal Formulation: With patient Time For Goal Achievement: 05/07/21 Potential to Achieve Goals: Poor    Frequency Min 2X/week     Co-evaluation               AM-PAC PT "6 Clicks" Mobility  Outcome Measure  Help needed turning from your back to your side while in a flat bed without using bedrails?: A Lot   Help needed moving to and from a bed to a chair (including a wheelchair)?: A Lot Help needed standing up from a chair using your arms (e.g., wheelchair or bedside chair)?: A Lot Help needed to walk in hospital room?: A Lot Help needed climbing 3-5 steps with a railing? : Total 6 Click Score: 9    End of Session Equipment Utilized During Treatment: Gait belt Activity Tolerance: Patient tolerated treatment well Patient left: in bed;with call bell/phone  within reach;with bed alarm set Nurse Communication: Mobility status PT Visit Diagnosis: Unsteadiness on feet (R26.81);Other abnormalities of gait and mobility (R26.89);Muscle weakness (generalized) (M62.81);History of falling (Z91.81);Difficulty in walking, not elsewhere classified (R26.2)    Time: 1587-2761 PT Time Calculation (min) (ACUTE ONLY): 38 min   Charges:   PT Evaluation $PT Eval Moderate Complexity: 1 Mod PT Treatments $Gait Training: 8-22 mins $Therapeutic Activity: 8-22 mins        Ludwig Tugwell M. Fairly IV, PT, DPT Physical Therapist- [facility]  04/28/2021, 11:48 AM

## 2021-04-28 NOTE — Progress Notes (Signed)
[place] [facility] ? ? ?PATIENT NAME: Lynn Norton   ? ?MR#:  [id number] ? ?DATE OF BIRTH:  1950-03-28 ? ?SUBJECTIVE:  ? ?patient postop day one elbow surgery. Overall doing well. No family at bedside ?denies any complaints. Denies shortness of breath or chest pain. ? ?VITALS:  ?Blood pressure (!) 115/56, pulse 94, temperature 98.1 ?F (36.7 ?C), resp. rate 16, height 5\' 1"  (1.549 m), weight 46.1 kg, SpO2 91 %. ? ?PHYSICAL EXAMINATION:  ? ?GENERAL:  71 y.o.-year-old patient lying in the bed with no acute distress.  ?LUNGS: Normal breath sounds bilaterally, no wheezing, rales, rhonchi.  ?CARDIOVASCULAR: S1, S2 normal. No murmurs, rubs, or gallops.  ?ABDOMEN: Soft, nontender, nondistended. Bowel sounds present.  ?EXTREMITIES: No  edema b/l.   Right arm sling+ ?NEUROLOGIC: nonfocal  patient is alert and awake ?SKIN: No obvious rash, lesion, or ulcer.  ? ?LABORATORY PANEL:  ?CBC ?Recent Labs  ?Lab 04/28/21 ?0310  ?WBC 10.0  ?HGB 10.8*  ?HCT 31.0*  ?PLT 567*  ? ? ?Chemistries  ?Recent Labs  ?Lab 04/22/21 ?0734 04/23/21 ?0324 04/28/21 ?0310  ?NA 130*   < > 134*  ?K 4.4   < > 5.0  ?CL 97*   < > 102  ?CO2 24   < > 23  ?GLUCOSE 129*   < > 204*  ?BUN 34*   < > 50*  ?CREATININE 2.10*   < > 2.14*  ?CALCIUM 7.3*   < > 7.7*  ?MG  --    < > 2.0  ?AST 25  --   --   ?ALT 30  --   --   ?ALKPHOS 102  --   --   ?BILITOT 0.8  --   --   ? < > = values in this interval not displayed.  ? ?Cardiac Enzymes ?No results for input(s): TROPONINI in the last 168 hours. ?RADIOLOGY:  ?DG MINI C-ARM IMAGE ONLY ? ?Result Date: 04/27/2021 ?There is no interpretation for this exam.  This order is for images obtained during a surgical procedure.  Please See "Surgeries" Tab for more information regarding the procedure.   ? ?Assessment and Plan ?Lynn Norton is a 71 y.o. female with history of rheumatoid arthritis who presented from home with complaints of multiple falls over last few days prior to admission and pain in  the right elbow. Patient lives with her husband in a house.  She ambulates with help of a cane.   ?She had become progressively more weak and unsteady resulting in falls at home. ?X-ray on admission showed a right nondisplaced mildly comminuted fracture through the right trochlea. ?Orthopedic surgery was consulted and she was placed in a sling with plans for operative repair. ? ?Sepsis secondary to E. coli UTI ?-- resolved ?-- patient was on IV antibiotic not change to PO Keflex ?-- came in with elevated white count, elevated Pro calcitonin AK I ?-- overall improved ? ?close fracture dislocation right elbow status post mechanical fall ?-- patient was seen by Dr. Rudene Christians ?-- status post ORIF on Monday, March 6 ?-- seen by physical therapy occupational therapy-- recommends rehab ? ?frequent falls at home with history of arthritis ?-- ambulates with cane ?-- lives with husband ?-- patient will need rehab at discharge ? ?AK I ?-- no prior labs for comparison ?-- creatinine 2.1 on admission ?-- renal ultrasound negative for obstruction ?-- creatinine remains stable at 2.1 ?-- creatinine was normal in July 2022 ?-- avoid  nephrotoxic agents ?-- patient will need to follow-up with nephrology as outpatient ? ?chronic tobacco abuse ?-- counsel cessation ? ?rheumatoid arthritis ?-- takes methotrexate and prednisone at home ?-- will resume at discharge ? ? ? ?Procedures: ORIF right elbow ?Family communication : none today ?Consults : orthopedic ?CODE STATUS: full ?DVT Prophylaxis : heparin ?Level of care: Med-Surg ?Status is: Inpatient ?Remains inpatient appropriate because: postop day one ?anticipate discharge to rehab tomorrow if remains stable ?  ? ?TOTAL TIME TAKING CARE OF THIS PATIENT: 25 minutes.  ?>50% time spent on counselling and coordination of care ? ?Note: This dictation was prepared with Dragon dictation along with smaller phrase technology. Any transcriptional errors that result from this process are  unintentional. ? ?Fritzi Mandes M.D  ? ? ?[facility]  ? ?CC: ?Primary care physician; [name], MD  ?

## 2021-04-28 NOTE — Plan of Care (Signed)

## 2021-04-28 NOTE — Progress Notes (Signed)
[facility]  ROUNDING NOTE   Subjective:   Patient seen sitting up in chair Starting on breakfast with therapy No complaints at this time   Objective:  Vital signs in last 24 hours:  Temp:  [97 F (36.1 C)-98 F (36.7 C)] 97.7 F (36.5 C) (03/07 1201) Pulse Rate:  [73-94] 79 (03/07 1201) Resp:  [14-24] 18 (03/07 1201) BP: (111-148)/(57-90) 115/57 (03/07 1201) SpO2:  [91 %-100 %] 92 % (03/07 1201) Weight:  [46.1 kg] 46.1 kg (03/06 1501)  Weight change:  Filed Weights   04/22/21 1100 04/22/21 1900 04/27/21 1501  Weight: 45.8 kg 46.1 kg 46.1 kg    Intake/Output: I/O last 3 completed shifts: In: 636.6 [P.O.:240; I.V.:346.6; IV Piggyback:50] Out: 2275 [Urine:2275]   Intake/Output this shift:  No intake/output data recorded.  Physical Exam: General: NAD  Head: Normocephalic, atraumatic. Moist oral mucosal membranes  Eyes: Anicteric  Lungs:  Clear to auscultation, normal effort  Heart: Regular rate and rhythm  Abdomen:  Soft, nontender  Extremities: No peripheral edema.  Neurologic: Nonfocal, moving all four extremities  Skin: No lesions       Basic Metabolic Panel: Recent Labs  Lab 04/25/21 0834 04/26/21 0802 04/27/21 0959 04/27/21 2004 04/28/21 0310  NA 136 136 134* 134* 134*  K 5.0 4.7 5.2* 5.6* 5.0  CL 105 109 104 102 102  CO2 24 20* 24 23 23   GLUCOSE 109* 80 101* 170* 204*  BUN 41* 38* 47* 45* 50*  CREATININE 2.17* 1.85* 2.26* 2.23* 2.14*  CALCIUM 7.8* 7.2* 8.2* 8.1* 7.7*  MG  --  1.8  --   --  2.0     Liver Function Tests: Recent Labs  Lab 04/22/21 0734  AST 25  ALT 30  ALKPHOS 102  BILITOT 0.8  PROT 6.1*  ALBUMIN 2.6*    No results for input(s): LIPASE, AMYLASE in the last 168 hours. No results for input(s): AMMONIA in the last 168 hours.  CBC: Recent Labs  Lab 04/22/21 0734 04/23/21 0324 04/24/21 0553 04/25/21 0834 04/26/21 0802 04/28/21 0310  WBC 30.7* 25.1* 24.2* 22.9* 16.6* 10.0  NEUTROABS 25.9*  --  20.8*   --  13.6* 9.3*  HGB 12.5 10.9* 11.0* 12.4 11.3* 10.8*  HCT 35.6* 31.2* 32.4* 35.7* 32.4* 31.0*  MCV 98.3 98.7 100.6* 99.7 98.2 98.1  PLT 253 296 352 447* 463* 567*     Cardiac Enzymes: Recent Labs  Lab 04/22/21 0734  CKTOTAL 124     BNP: Invalid input(s): POCBNP  CBG: No results for input(s): GLUCAP in the last 168 hours.  Microbiology: Results for orders placed or performed during the hospital encounter of 04/22/21  Urine Culture     Status: Abnormal   Collection Time: 04/22/21  9:29 AM   Specimen: Urine, Clean Catch  Result Value Ref Range Status   Specimen Description   Final    URINE, CLEAN CATCH Performed at [facility], [address], [place], [place] [address]    Special Requests   Final    NONE Performed at [facility], [address] [place], [place] [address]    Culture >=100,000 COLONIES/mL ESCHERICHIA COLI (A)  Final   Report Status 04/25/2021 FINAL  Final   Organism ID, Bacteria ESCHERICHIA COLI (A)  Final      Susceptibility   Escherichia coli - MIC*    AMPICILLIN >=32 RESISTANT Resistant     CEFAZOLIN <=4 SENSITIVE Sensitive     CEFEPIME <=0.12 SENSITIVE Sensitive  CEFTRIAXONE <=0.25 SENSITIVE Sensitive     CIPROFLOXACIN <=0.25 SENSITIVE Sensitive     GENTAMICIN <=1 SENSITIVE Sensitive     IMIPENEM <=0.25 SENSITIVE Sensitive     NITROFURANTOIN <=16 SENSITIVE Sensitive     TRIMETH/SULFA <=20 SENSITIVE Sensitive     AMPICILLIN/SULBACTAM 16 INTERMEDIATE Intermediate     PIP/TAZO <=4 SENSITIVE Sensitive     * >=100,000 COLONIES/mL ESCHERICHIA COLI  Culture, blood (single)     Status: Abnormal   Collection Time: 04/22/21 10:18 AM   Specimen: BLOOD  Result Value Ref Range Status   Specimen Description   Final    BLOOD LEFT AC Performed at [facility], [address] [place], [place] [address]    Special Requests   Final    BOTTLES DRAWN AEROBIC AND ANAEROBIC Blood Culture adequate volume Performed at  [facility], [address] [place], [place] [address]    Culture  Setup Time   Final    GRAM NEGATIVE RODS IN BOTH AEROBIC AND ANAEROBIC BOTTLES CRITICAL RESULT CALLED TO, READ BACK BY AND VERIFIED WITH: JASON ROBBINS @ 4010 ON 04/23/2021.Marland KitchenMarland KitchenTKR GRAM POSITIVE COCCI ANAEROBIC BOTTLE ONLY CRITICAL RESULT CALLED TO, READ BACK BY AND VERIFIED WITH: D,MITCHELL PHARMD@1048  04/23/21 EB    Culture (A)  Final    ESCHERICHIA COLI STAPHYLOCOCCUS HOMINIS THE SIGNIFICANCE OF ISOLATING THIS ORGANISM FROM A SINGLE SET OF BLOOD CULTURES WHEN MULTIPLE SETS ARE DRAWN IS UNCERTAIN. PLEASE NOTIFY THE MICROBIOLOGY DEPARTMENT WITHIN ONE WEEK IF SPECIATION AND SENSITIVITIES ARE REQUIRED. Performed at [facility], [place] [address] [place], [place] [address]    Report Status 04/26/2021 FINAL  Final   Organism ID, Bacteria ESCHERICHIA COLI  Final      Susceptibility   Escherichia coli - MIC*    AMPICILLIN >=32 RESISTANT Resistant     CEFAZOLIN <=4 SENSITIVE Sensitive     CEFEPIME <=0.12 SENSITIVE Sensitive     CEFTAZIDIME <=1 SENSITIVE Sensitive     CEFTRIAXONE <=0.25 SENSITIVE Sensitive     CIPROFLOXACIN <=0.25 SENSITIVE Sensitive     GENTAMICIN <=1 SENSITIVE Sensitive     IMIPENEM 0.5 SENSITIVE Sensitive     TRIMETH/SULFA <=20 SENSITIVE Sensitive     AMPICILLIN/SULBACTAM 8 SENSITIVE Sensitive     PIP/TAZO <=4 SENSITIVE Sensitive     * ESCHERICHIA COLI  Blood Culture ID Panel (Reflexed)     Status: Abnormal   Collection Time: 04/22/21 10:18 AM  Result Value Ref Range Status   Enterococcus faecalis NOT DETECTED NOT DETECTED Final   Enterococcus Faecium NOT DETECTED NOT DETECTED Final   Listeria monocytogenes NOT DETECTED NOT DETECTED Final   Staphylococcus species NOT DETECTED NOT DETECTED Final   Staphylococcus aureus (BCID) NOT DETECTED NOT DETECTED Final   Staphylococcus epidermidis NOT DETECTED NOT DETECTED Final   Staphylococcus lugdunensis NOT DETECTED NOT DETECTED Final    Streptococcus species NOT DETECTED NOT DETECTED Final   Streptococcus agalactiae NOT DETECTED NOT DETECTED Final   Streptococcus pneumoniae NOT DETECTED NOT DETECTED Final   Streptococcus pyogenes NOT DETECTED NOT DETECTED Final   A.calcoaceticus-baumannii NOT DETECTED NOT DETECTED Final   Bacteroides fragilis NOT DETECTED NOT DETECTED Final   Enterobacterales DETECTED (A) NOT DETECTED Final    Comment: Enterobacterales represent a large order of gram negative bacteria, not a single organism. CRITICAL RESULT CALLED TO, READ BACK BY AND VERIFIED WITH: JASON ROBBINS @ 6644 ON 04/23/2021.Marland KitchenMarland KitchenTKR    Enterobacter cloacae complex NOT DETECTED NOT DETECTED Final   Escherichia coli DETECTED (A) NOT DETECTED  Final    Comment: CRITICAL RESULT CALLED TO, READ BACK BY AND VERIFIED WITH: JASON ROBBINS @ 21 ON 04/23/2021.Marland KitchenMarland KitchenTKR    Klebsiella aerogenes NOT DETECTED NOT DETECTED Final   Klebsiella oxytoca NOT DETECTED NOT DETECTED Final   Klebsiella pneumoniae NOT DETECTED NOT DETECTED Final   Proteus species NOT DETECTED NOT DETECTED Final   Salmonella species NOT DETECTED NOT DETECTED Final   Serratia marcescens NOT DETECTED NOT DETECTED Final   Haemophilus influenzae NOT DETECTED NOT DETECTED Final   Neisseria meningitidis NOT DETECTED NOT DETECTED Final   Pseudomonas aeruginosa NOT DETECTED NOT DETECTED Final   Stenotrophomonas maltophilia NOT DETECTED NOT DETECTED Final   Candida albicans NOT DETECTED NOT DETECTED Final   Candida auris NOT DETECTED NOT DETECTED Final   Candida glabrata NOT DETECTED NOT DETECTED Final   Candida krusei NOT DETECTED NOT DETECTED Final   Candida parapsilosis NOT DETECTED NOT DETECTED Final   Candida tropicalis NOT DETECTED NOT DETECTED Final   Cryptococcus neoformans/gattii NOT DETECTED NOT DETECTED Final   CTX-M ESBL NOT DETECTED NOT DETECTED Final   Carbapenem resistance IMP NOT DETECTED NOT DETECTED Final   Carbapenem resistance KPC NOT DETECTED NOT DETECTED Final    Carbapenem resistance NDM NOT DETECTED NOT DETECTED Final   Carbapenem resist OXA 48 LIKE NOT DETECTED NOT DETECTED Final   Carbapenem resistance VIM NOT DETECTED NOT DETECTED Final    Comment: Performed at [facility], [address] [place], [place] [address]  Blood Culture ID Panel (Reflexed)     Status: Abnormal   Collection Time: 04/22/21 10:18 AM  Result Value Ref Range Status   Enterococcus faecalis NOT DETECTED NOT DETECTED Final   Enterococcus Faecium NOT DETECTED NOT DETECTED Final   Listeria monocytogenes NOT DETECTED NOT DETECTED Final   Staphylococcus species DETECTED (A) NOT DETECTED Final    Comment: CRITICAL RESULT CALLED TO, READ BACK BY AND VERIFIED WITH: D,MITCHELL PHARMD@1048  04/23/21 EB    Staphylococcus aureus (BCID) NOT DETECTED NOT DETECTED Final   Staphylococcus epidermidis NOT DETECTED NOT DETECTED Final   Staphylococcus lugdunensis NOT DETECTED NOT DETECTED Final   Streptococcus species NOT DETECTED NOT DETECTED Final   Streptococcus agalactiae NOT DETECTED NOT DETECTED Final   Streptococcus pneumoniae NOT DETECTED NOT DETECTED Final   Streptococcus pyogenes NOT DETECTED NOT DETECTED Final   A.calcoaceticus-baumannii NOT DETECTED NOT DETECTED Final   Bacteroides fragilis NOT DETECTED NOT DETECTED Final   Enterobacterales DETECTED (A) NOT DETECTED Final    Comment: Enterobacterales represent a large order of gram negative bacteria, not a single organism. CRITICAL RESULT CALLED TO, READ BACK BY AND VERIFIED WITH: D,MITCHELL PHARMD@1048  04/23/21 EB    Enterobacter cloacae complex NOT DETECTED NOT DETECTED Final   Escherichia coli DETECTED (A) NOT DETECTED Final    Comment: CRITICAL RESULT CALLED TO, READ BACK BY AND VERIFIED WITH: D,MITCHELL PHARMD@1048  04/23/21 EB    Klebsiella aerogenes NOT DETECTED NOT DETECTED Final   Klebsiella oxytoca NOT DETECTED NOT DETECTED Final   Klebsiella pneumoniae NOT DETECTED NOT DETECTED Final   Proteus species NOT  DETECTED NOT DETECTED Final   Salmonella species NOT DETECTED NOT DETECTED Final   Serratia marcescens NOT DETECTED NOT DETECTED Final   Haemophilus influenzae NOT DETECTED NOT DETECTED Final   Neisseria meningitidis NOT DETECTED NOT DETECTED Final   Pseudomonas aeruginosa NOT DETECTED NOT DETECTED Final   Stenotrophomonas maltophilia NOT DETECTED NOT DETECTED Final   Candida albicans NOT DETECTED NOT DETECTED Final   Candida auris NOT DETECTED NOT DETECTED  Final   Candida glabrata NOT DETECTED NOT DETECTED Final   Candida krusei NOT DETECTED NOT DETECTED Final   Candida parapsilosis NOT DETECTED NOT DETECTED Final   Candida tropicalis NOT DETECTED NOT DETECTED Final   Cryptococcus neoformans/gattii NOT DETECTED NOT DETECTED Final   CTX-M ESBL NOT DETECTED NOT DETECTED Final   Carbapenem resistance IMP NOT DETECTED NOT DETECTED Final   Carbapenem resistance KPC NOT DETECTED NOT DETECTED Final   Carbapenem resistance NDM NOT DETECTED NOT DETECTED Final   Carbapenem resist OXA 48 LIKE NOT DETECTED NOT DETECTED Final   Carbapenem resistance VIM NOT DETECTED NOT DETECTED Final    Comment: Performed at [facility], [place] [address], [place], [place] [address]  Resp Panel by RT-PCR (Flu A&B, Covid) Nasopharyngeal Swab     Status: None   Collection Time: 04/22/21  5:07 PM   Specimen: Nasopharyngeal Swab; Nasopharyngeal(NP) swabs in vial transport medium  Result Value Ref Range Status   SARS Coronavirus 2 by RT PCR NEGATIVE NEGATIVE Final    Comment: (NOTE) SARS-CoV-2 target nucleic acids are NOT DETECTED.  The SARS-CoV-2 RNA is generally detectable in upper respiratory specimens during the acute phase of infection. The lowest concentration of SARS-CoV-2 viral copies this assay can detect is 138 copies/mL. A negative result does not preclude SARS-Cov-2 infection and should not be used as the sole basis for treatment or other patient management decisions. A negative result may occur  with  improper specimen collection/handling, submission of specimen other than nasopharyngeal swab, presence of viral mutation(s) within the areas targeted by this assay, and inadequate number of viral copies(<138 copies/mL). A negative result must be combined with clinical observations, patient history, and epidemiological information. The expected result is Negative.  Fact Sheet for Patients:  EntrepreneurPulse.com.au  Fact Sheet for Healthcare Providers:  IncredibleEmployment.be  This test is no t yet approved or cleared by the [place] FDA and  has been authorized for detection and/or diagnosis of SARS-CoV-2 by FDA under an Emergency Use Authorization (EUA). This EUA will remain  in effect (meaning this test can be used) for the duration of the COVID-19 declaration under Section 564(b)(1) of the Act, 21 U.S.C.section 360bbb-3(b)(1), unless the authorization is terminated  or revoked sooner.       Influenza A by PCR NEGATIVE NEGATIVE Final   Influenza B by PCR NEGATIVE NEGATIVE Final    Comment: (NOTE) The Xpert Xpress SARS-CoV-2/FLU/RSV plus assay is intended as an aid in the diagnosis of influenza from Nasopharyngeal swab specimens and should not be used as a sole basis for treatment. Nasal washings and aspirates are unacceptable for Xpert Xpress SARS-CoV-2/FLU/RSV testing.  Fact Sheet for Patients: EntrepreneurPulse.com.au  Fact Sheet for Healthcare Providers: IncredibleEmployment.be  This test is not yet approved or cleared by the [place] FDA and has been authorized for detection and/or diagnosis of SARS-CoV-2 by FDA under an Emergency Use Authorization (EUA). This EUA will remain in effect (meaning this test can be used) for the duration of the COVID-19 declaration under Section 564(b)(1) of the Act, 21 U.S.C. section 360bbb-3(b)(1), unless the authorization is terminated  or revoked.  Performed at [facility], [address] [place], [place] [address]   Surgical PCR screen     Status: None   Collection Time: 04/22/21  5:10 PM   Specimen: Nasal Mucosa; Nasal Swab  Result Value Ref Range Status   MRSA, PCR NEGATIVE NEGATIVE Final   Staphylococcus aureus NEGATIVE NEGATIVE Final    Comment: (NOTE)  The Xpert SA Assay (FDA approved for NASAL specimens in patients 39 years of age and older), is one component of a comprehensive surveillance program. It is not intended to diagnose infection nor to guide or monitor treatment. Performed at [facility], [address] [place], [place] [address]   MRSA Next Gen by PCR, Nasal     Status: None   Collection Time: 04/22/21  6:42 PM   Specimen: Nasal Mucosa; Nasal Swab  Result Value Ref Range Status   MRSA by PCR Next Gen NOT DETECTED NOT DETECTED Final    Comment: (NOTE) The GeneXpert MRSA Assay (FDA approved for NASAL specimens only), is one component of a comprehensive MRSA colonization surveillance program. It is not intended to diagnose MRSA infection nor to guide or monitor treatment for MRSA infections. Test performance is not FDA approved in patients less than 14 years old. Performed at [facility], [address] [place], [place] [address]   CULTURE, BLOOD (ROUTINE X 2) w Reflex to ID Panel     Status: None   Collection Time: 04/22/21  6:48 PM   Specimen: BLOOD  Result Value Ref Range Status   Specimen Description BLOOD BLOOD RIGHT HAND  Final   Special Requests   Final    BOTTLES DRAWN AEROBIC AND ANAEROBIC Blood Culture adequate volume   Culture   Final    NO GROWTH 5 DAYS Performed at [facility], [address] [place], [place] [address]    Report Status 04/27/2021 FINAL  Final  CULTURE, BLOOD (ROUTINE X 2) w Reflex to ID Panel     Status: None   Collection Time: 04/22/21  7:03 PM   Specimen: BLOOD  Result Value Ref Range Status   Specimen  Description BLOOD BLOOD LEFT HAND  Final   Special Requests   Final    BOTTLES DRAWN AEROBIC AND ANAEROBIC Blood Culture adequate volume   Culture   Final    NO GROWTH 5 DAYS Performed at [facility], [address], [place], [place] [address]    Report Status 04/27/2021 FINAL  Final    Coagulation Studies: No results for input(s): LABPROT, INR in the last 72 hours.  Urinalysis: No results for input(s): COLORURINE, LABSPEC, PHURINE, GLUCOSEU, HGBUR, BILIRUBINUR, KETONESUR, PROTEINUR, UROBILINOGEN, NITRITE, LEUKOCYTESUR in the last 72 hours.  Invalid input(s): APPERANCEUR     Imaging: DG MINI C-ARM IMAGE ONLY  Result Date: 04/27/2021 There is no interpretation for this exam.  This order is for images obtained during a surgical procedure.  Please See "Surgeries" Tab for more information regarding the procedure.     Medications:    sodium chloride 75 mL/hr at 04/27/21 1748     amLODipine  5 mg Oral Daily   cephALEXin  500 mg Oral Q12H   feeding supplement  237 mL Oral BID BM   heparin  5,000 Units Subcutaneous Q8H   multivitamin with minerals  1 tablet Oral Daily   nicotine  14 mg Transdermal Daily   traZODone  50 mg Oral QHS   morphine injection, oxyCODONE, polyethylene glycol  Assessment/ Plan:  Ms. Lynn Norton is a 71 y.o.  female with past medical conditions including arthritis, hematuria, hypertension, OA, RA, hepatitis, and osteoporosis, who was admitted to [facility] on 04/22/2021 for Weakness [R53.1] Fall, initial encounter [W19.XXXA] Elbow fracture, right, closed, initial encounter [S42.401A] Sepsis ([place]) [A41.9] Urinary tract infection without hematuria, site unspecified [N39.0] Sepsis, due to unspecified organism, unspecified whether acute organ dysfunction  present [facility]) [A41.9]   Acute kidney injury likely secondary to progression of urinary tract infection.  Normal renal function July 2022.  Renal ultrasound negative for obstruction.  No IV contrast  exposure.    Creatinine improved  Adequate urine output recorded. Will continue to monitor.   Lab Results  Component Value Date   CREATININE 2.14 (H) 04/28/2021   CREATININE 2.23 (H) 04/27/2021   CREATININE 2.26 (H) 04/27/2021    Intake/Output Summary (Last 24 hours) at 04/28/2021 1350 Last data filed at 04/28/2021 1043 Gross per 24 hour  Intake 636.6 ml  Output 1000 ml  Net -363.4 ml    2. Hypertension .  Blood pressure elevated overnight, given one-time dose IV labetalol 10 mg.    Blood pressure 115/57   3.  Sepsis secondary to UTI.  UA suggestive of UTI, currently awaiting urine culture.  IV antibiotics course completed.  Blood culture positive for E. coli from suspected urinary tract infection.  4. Anemia of chronic disease Hemoglobin remains within acceptable range  5.Hyponatremia Sodium stable  6. Right Elbow/olecranon fracture with ORIF on 04/27/21. Ortho following. Right arm sling in place, NWB.    LOS: 6   3/7/20231:50 PM

## 2021-04-28 NOTE — Progress Notes (Signed)
Nutrition Follow-up ? ?DOCUMENTATION CODES:  ? ?Not applicable ? ?INTERVENTION:  ? ?-Continue Ensure Enlive po BID, each supplement provides 350 kcal and 20 grams of protein.  ?-Continue MVI with minerals daily ?-Liberalize diet to 2 gram sodium for wider variety of meal selections ? ?NUTRITION DIAGNOSIS:  ? ?Increased nutrient needs related to post-op healing as evidenced by estimated needs. ? ?Ongoing ? ?GOAL:  ? ?Patient will meet greater than or equal to 90% of their needs ? ?Progressing  ? ?MONITOR:  ? ?PO intake, Supplement acceptance, Labs, Weight trends, Skin, I & O's ? ?REASON FOR ASSESSMENT:  ? ?Malnutrition Screening Tool ?  ? ?ASSESSMENT:  ? ?Lynn Norton is a 71 y.o. female with history of arthritis who presented from home with complaints of multiple falls over last few days, pain on the right elbow.  Patient lives with her husband in a house.  She ambulates with help of cane.  Over the last several weeks, she has been weak, and steady and fallen several times.  Last fall was last night when she could not get up and was very weak.  While going to the bathroom, she fell on her right side and developed pain on her right shoulder and right elbow.  Husband brought her to the emergency department. ? ?3/6- Procedure(s): ?OPEN REDUCTION INTERNAL FIXATION (ORIF) ELBOW/OLECRANON FRACTURE (Right) ? ?Reviewed I/O's: -1.2 L x 24 hours and -2.9 L since admission ? ?UOP: 1.8 L x 24 hours  ? ?Spoke with pt at bedside. Pt reports she has a good appetite and has been consuming 50-100% of her meals. Per pt, she had a good appetite PTA and usually consumed 2 meals per day (meat, starch, and vegetable).  ? ?Pt denies any weight loss. She reports her UBW is around 120#.  ? ?Discussed importance of good meal and supplement intake to promote healing. Pt amenable to current care plan.  ? ?Noted pt with AKI on renal diet; RD will liberalize diet for wider variety of meals selections. ? ?Per therapy notes, recommending SNF at  discharge. Pt reports concern about not seeing her husband, who is undergoing cancer treatments. RD provided supportive presence.  ? ?Labs reviewed: Na: 134.   ? ?NUTRITION - FOCUSED PHYSICAL EXAM: ? ?Flowsheet Row Most Recent Value  ?Orbital Region No depletion  ?Upper Arm Region Mild depletion  ?Thoracic and Lumbar Region No depletion  ?Buccal Region No depletion  ?Temple Region No depletion  ?Clavicle Bone Region Mild depletion  ?Clavicle and Acromion Bone Region No depletion  ?Scapular Bone Region No depletion  ?Dorsal Hand No depletion  ?Patellar Region No depletion  ?Anterior Thigh Region No depletion  ?Posterior Calf Region No depletion  ?Edema (RD Assessment) Mild  ?Hair Reviewed  ?Eyes Reviewed  ?Mouth Reviewed  ?Skin Reviewed  ?Nails Reviewed  ? ?  ? ? ?Diet Order:   ?Diet Order   ? ?       ?  Diet renal with fluid restriction Fluid restriction: Other (see comments); Room service appropriate? Yes; Fluid consistency: Thin  Diet effective now       ?  ? ?  ?  ? ?  ? ? ?EDUCATION NEEDS:  ? ?No education needs have been identified at this time ? ?Skin:  Skin Assessment: Skin Integrity Issues: ?Skin Integrity Issues:: Incisions ?Incisions: closed rt arm ? ?Last BM:  04/26/21 ? ?Height:  ? ?Ht Readings from Last 1 Encounters:  ?04/27/21 5\' 1"  (1.549 m)  ? ? ?Weight:  ? ?  Wt Readings from Last 1 Encounters:  ?04/27/21 46.1 kg  ? ? ?Ideal Body Weight:  47.7 kg ? ?BMI:  Body mass index is 19.2 kg/m?. ? ?Estimated Nutritional Needs:  ? ?Kcal:  1450-1650 ? ?Protein:  70-85 grams ? ?Fluid:  > 1.4 L ? ? ? ?Loistine Chance, RD, LDN, CDCES ?Registered Dietitian II ?Certified Diabetes Care and Education Specialist ?Please refer to [facility] for RD and/or RD on-call/weekend/after hours pager  ?

## 2021-04-29 ENCOUNTER — Encounter: Payer: Self-pay | Admitting: Orthopedic Surgery

## 2021-04-29 LAB — BASIC METABOLIC PANEL
Anion gap: 4 — ABNORMAL LOW (ref 5–15)
BUN: 49 mg/dL — ABNORMAL HIGH (ref 8–23)
CO2: 24 mmol/L (ref 22–32)
Calcium: 7.3 mg/dL — ABNORMAL LOW (ref 8.9–10.3)
Chloride: 108 mmol/L (ref 98–111)
Creatinine, Ser: 2.01 mg/dL — ABNORMAL HIGH (ref 0.44–1.00)
GFR, Estimated: 26 mL/min — ABNORMAL LOW (ref >60)
Glucose, Bld: 96 mg/dL (ref 70–99)
Potassium: 4.7 mmol/L (ref 3.5–5.1)
Sodium: 136 mmol/L (ref 135–145)

## 2021-04-29 LAB — MAGNESIUM: Magnesium: 2 mg/dL (ref 1.7–2.4)

## 2021-04-29 LAB — RESP PANEL BY RT-PCR (FLU A&B, COVID) ARPGX2
Influenza A by PCR: NEGATIVE
Influenza B by PCR: NEGATIVE
SARS Coronavirus 2 by RT PCR: NEGATIVE

## 2021-04-29 MED ORDER — CEPHALEXIN 500 MG PO CAPS
500.0000 mg | ORAL_CAPSULE | Freq: Two times a day (BID) | ORAL | 0 refills | Status: AC
Start: 2021-04-29 — End: 2021-05-01

## 2021-04-29 MED ORDER — POLYETHYLENE GLYCOL 3350 17 G PO PACK: 17.0000 g | PACK | Freq: Every day | ORAL | 0 refills | Status: DC | PRN

## 2021-04-29 MED ORDER — NICOTINE 14 MG/24HR TD PT24: 14.0000 mg | MEDICATED_PATCH | Freq: Every day | TRANSDERMAL | 0 refills | Status: DC

## 2021-04-29 MED ORDER — ENSURE ENLIVE PO LIQD: 237.0000 mL | Freq: Two times a day (BID) | ORAL | 12 refills | Status: DC

## 2021-04-29 MED ORDER — OXYCODONE HCL 5 MG PO TABS: 5.0000 mg | ORAL_TABLET | Freq: Four times a day (QID) | ORAL | 0 refills | Status: DC | PRN

## 2021-04-29 NOTE — Progress Notes (Signed)
Called and gave report to Melody at [facility].  ?

## 2021-04-29 NOTE — Care Management Important Message (Signed)
Important Message ? ?Patient Details  ?Name: Lynn Norton ?MRN: [id number] ?Date of Birth: 10-19-1950 ? ? ?Medicare Important Message Given:  Yes ? ? ? ? ?Lynn Norton ?04/29/2021, 10:59 AM ?

## 2021-04-29 NOTE — TOC Progression Note (Signed)
Transition of Care (TOC) - Progression Note  ? ? ?Patient Details  ?Name: Abegail Kloeppel ?MRN: [id number] ?Date of Birth: May 13, 1950 ? ?Transition of Care (TOC) CM/SW Contact  ?Conception Oms, RN ?Phone Number: ?04/29/2021, 12:06 PM ? ?Clinical Narrative:   Called EMS to transport to [facility] ?Covid neg ?She is 5th on the list ? ? ? ? ?Expected Discharge Plan: [place] ?Barriers to Discharge: Continued Medical Work up ? ?Expected Discharge Plan and Services ?Expected Discharge Plan: [place] ?  ?  ?  ?  ?Expected Discharge Date: 04/29/21               ?  ?  ?  ?  ?  ?  ?  ?  ?  ?  ? ? ?Social Determinants of Health (SDOH) Interventions ?  ? ?Readmission Risk Interventions ?No flowsheet data found. ? ?

## 2021-04-29 NOTE — Plan of Care (Signed)

## 2021-04-29 NOTE — Discharge Summary (Signed)
Physician Discharge Summary   Patient: Lynn Norton MRN: [id number] DOB: Nov 30, 1950  Admit date:     04/22/2021  Discharge date: 04/29/21  Discharge Physician: Fritzi Mandes   PCP: [name], MD   Recommendations at discharge:   follow-up orthopedic Dr. Rudene Christians on March 20 follow-up your PCP Dr. Ginette Pitman in 1 to 2 weeks follow-up Dr. Candiss Norse nephrology in 1 to 2 weeks  Discharge Diagnoses: E. coli sepsis UTI close fracture or dislocation of right elbow status post ORIF   Hospital Course:  Lynn Norton is a 71 y.o. female with history of rheumatoid arthritis who presented from home with complaints of multiple falls over last few days prior to admission and pain in the right elbow. Patient lives with her husband in a house.  She ambulates with help of a cane.   She had become progressively more weak and unsteady resulting in falls at home. X-ray on admission showed a right nondisplaced mildly comminuted fracture through the right trochlea. Orthopedic surgery was consulted and she was placed in a sling with plans for operative repair.   Sepsis secondary to E. coli UTI -- resolved -- patient was on IV antibiotic not change to PO Keflex -- came in with elevated white count, elevated Pro calcitonin AK I -- overall improved   close fracture dislocation right elbow status post mechanical fall -- patient was seen by Dr. Rudene Christians -- status post ORIF on Monday, March 6 -- seen by physical therapy occupational therapy-- recommends rehab   frequent falls at home with history of arthritis -- ambulates with cane -- lives with husband -- patient will need rehab at discharge   AK I -- no prior labs for comparison -- creatinine 2.6 on admission -- renal ultrasound negative for obstruction -- creatinine remains stable at 2.0-- there likely is chronic component to this. -- avoid nephrotoxic agents -- patient will need to follow-up with nephrology as outpatient. Mickel Baas send out labs will  need to be followed as outpatient. --good UOP   chronic tobacco abuse -- counselled cessation   rheumatoid arthritis -- takes methotrexate and prednisone at home -- will resume at discharge.  Overall hemodynamically stable. Progressing slowly. Okay from orthopedic standpoint for discharge. Patient is in agreement with [facility] to [facility]       Procedures: ORIF right elbow Family communication : none today Consults : orthopedic CODE STATUS: full DVT Prophylaxis : heparin Level of care: Med-Surg Status is: Inpatient   discharged to rehab today      Consultants: Dr. Candiss Norse nephrology, orthopedic Dr. Rudene Christians Procedures performed: ORIF right elbow Disposition: Skilled nursing facility Diet recommendation:  Discharge Diet Orders (From admission, onward)     Start     Ordered   04/29/21 0000  Diet - low sodium heart healthy        04/29/21 1042           Cardiac diet DISCHARGE MEDICATION: Allergies as of 04/29/2021       Reactions   Rocephin [ceftriaxone] Hives   Spoke with patient. States she had hives that spread in response to rocephin   Sulfa Antibiotics    Penicillins Itching        Medication List     TAKE these medications    albuterol 108 (90 Base) MCG/ACT inhaler Commonly known as: VENTOLIN HFA Inhale 1-2 puffs into the lungs 4 (four) times daily as needed for wheezing or shortness of breath.   amLODipine 5 MG tablet Commonly known as: NORVASC  Take 5 mg by mouth daily. (Take with 2.5mg  tablet to equal 7.5mg  total)   amLODipine 2.5 MG tablet Commonly known as: NORVASC Take 2.5 mg by mouth daily. (Take with 5mg  tablet to equal 7.5mg  total)   cephALEXin 500 MG capsule Commonly known as: KEFLEX Take 1 capsule (500 mg total) by mouth every 12 (twelve) hours for 5 doses.   feeding supplement Liqd Take 237 mLs by mouth 2 (two) times daily between meals.   Fish Oil 1200 MG Caps Take 1 capsule by mouth daily.   folic acid 1 MG  tablet Commonly known as: FOLVITE Take 1 mg by mouth daily.   glucosamine-chondroitin 500-400 MG tablet Take 1 tablet by mouth 2 (two) times daily.   meloxicam 15 MG tablet Commonly known as: MOBIC Take 15 mg by mouth daily.   methotrexate 2.5 MG tablet Commonly known as: RHEUMATREX Take 15 mg by mouth every Wednesday.   multivitamin with minerals tablet Take 1 tablet by mouth daily.   nicotine 14 mg/24hr patch Commonly known as: NICODERM CQ - dosed in mg/24 hours Place 1 patch (14 mg total) onto the skin daily.   omeprazole 20 MG tablet Commonly known as: PRILOSEC OTC Take 20 mg by mouth daily.   oxyCODONE 5 MG immediate release tablet Commonly known as: Oxy IR/ROXICODONE Take 1 tablet (5 mg total) by mouth every 6 (six) hours as needed for moderate pain.   polyethylene glycol 17 g packet Commonly known as: MIRALAX / GLYCOLAX Take 17 g by mouth daily as needed for mild constipation.   predniSONE 2.5 MG tablet Commonly known as: DELTASONE Take 2.5 mg by mouth daily.   traZODone 50 MG tablet Commonly known as: DESYREL Take 50 mg by mouth at bedtime.   zolpidem 10 MG tablet Commonly known as: AMBIEN Take 10 mg by mouth at bedtime as needed for sleep.        Follow-up Information     Duanne Guess, PA-C. Schedule an appointment as soon as possible for a visit on 05/11/2021.   Specialties: Orthopedic Surgery, Emergency Medicine Why: For suture removal, For wound re-check Contact information: [place] [address] [phone]         [name], MD. Schedule an appointment as soon as possible for a visit in 1 week(s).   Specialty: Internal Medicine Contact information: [place] [address] [phone]                Discharge Exam: Danley Danker Weights   04/22/21 1100 04/22/21 1900 04/27/21 1501  Weight: 45.8 kg 46.1 kg 46.1 kg     Condition at discharge: fair  The  results of significant diagnostics from this hospitalization (including imaging, microbiology, ancillary and laboratory) are listed below for reference.   Imaging Studies: DG Shoulder Right  Result Date: 04/22/2021 CLINICAL DATA:  Status post fall.  Right shoulder pain. EXAM: RIGHT SHOULDER - 2+ VIEW COMPARISON:  None. FINDINGS: Post right humeral arthroplasty. No evidence of acute fracture. High riding prostatic humeral head on the glenoid, suggestive of chronic rotator cuff injury. The visualized lung parenchyma is clear. IMPRESSION: 1. No acute fracture or dislocation identified about the right shoulder. 2. Post right humeral arthroplasty. 3. Chronic rotator cuff injury. Electronically Signed   By: Fidela Salisbury M.D.   On: 04/22/2021 08:39   DG Elbow Complete Right  Result Date: 04/22/2021 CLINICAL DATA:  Status post fall EXAM: RIGHT ELBOW - COMPLETE 3+ VIEW COMPARISON:  None. FINDINGS: Generalized osteopenia. Nondisplaced mildly comminuted fracture of right trochlea. No other fracture or dislocation. No aggressive osseous lesion. Mild osteoarthritis of the right elbow. No soft tissue abnormality. IMPRESSION: 1. Nondisplaced mildly comminuted fracture of right trochlea. Electronically Signed   By: Kathreen Devoid M.D.   On: 04/22/2021 08:37   CT Head Wo Contrast  Result Date: 04/22/2021 CLINICAL DATA:  Multiple falls over the last several days. EXAM: CT HEAD WITHOUT CONTRAST CT CERVICAL SPINE WITHOUT CONTRAST TECHNIQUE: Multidetector CT imaging of the head and cervical spine was performed following the standard protocol without intravenous contrast. Multiplanar CT image reconstructions of the cervical spine were also generated. RADIATION DOSE REDUCTION: This exam was performed according to the departmental dose-optimization program which includes automated exposure control, adjustment of the mA and/or kV according to patient size and/or use of iterative reconstruction technique. COMPARISON:  None.  FINDINGS: Brain: No evidence of acute infarction, hemorrhage, extra-axial collection, ventriculomegaly, or mass effect. Generalized cerebral atrophy. Periventricular white matter low attenuation likely secondary to microangiopathy. Vascular: Cerebrovascular atherosclerotic calcifications are noted. No hyperdense vessels. Skull: Negative for fracture or focal lesion. Sinuses/Orbits: Visualized portions of the orbits are unremarkable. Visualized portions of the paranasal sinuses are unremarkable. Visualized portions of the mastoid air cells are unremarkable. Other: None. CT CERVICAL SPINE FINDINGS Alignment: Normal. Skull base and vertebrae: No acute fracture. No primary bone lesion or focal pathologic process. Soft tissues and spinal canal: No prevertebral fluid or swelling. No visible canal hematoma. Disc levels: Degenerative disease with disc height loss at C4-5, C5-6, C6-7 and C7-T1. Mild bilateral facet arthropathy at C4-5, C5-6, C6-7 and C7-T1. Upper chest: Lung apices are clear. Other: No fluid collection or hematoma. Bilateral carotid artery atherosclerosis. IMPRESSION: 1. No acute intracranial pathology. 2.  No acute osseous injury of the cervical spine. 3. Cervical spine spondylosis as described above. Electronically Signed   By: Kathreen Devoid M.D.   On: 04/22/2021 09:38   CT Cervical Spine Wo Contrast  Result Date: 04/22/2021 CLINICAL DATA:  Multiple falls over the last several days. EXAM: CT HEAD WITHOUT CONTRAST CT CERVICAL SPINE WITHOUT CONTRAST TECHNIQUE: Multidetector CT imaging of the head and cervical spine was performed following the standard protocol without intravenous contrast. Multiplanar CT image reconstructions of the cervical spine were also generated. RADIATION DOSE REDUCTION: This exam was performed according to the departmental dose-optimization program which includes automated exposure control, adjustment of the mA and/or kV according to patient size and/or use of iterative  reconstruction technique. COMPARISON:  None. FINDINGS: Brain: No evidence of acute infarction, hemorrhage, extra-axial collection, ventriculomegaly, or mass effect. Generalized cerebral atrophy. Periventricular white matter low attenuation likely secondary to microangiopathy. Vascular: Cerebrovascular atherosclerotic calcifications are noted. No hyperdense vessels. Skull: Negative for fracture or focal lesion. Sinuses/Orbits: Visualized portions of the orbits are unremarkable. Visualized portions of the paranasal sinuses are unremarkable. Visualized portions of the mastoid air cells are unremarkable. Other: None. CT CERVICAL SPINE FINDINGS Alignment: Normal. Skull base and vertebrae: No acute fracture. No primary bone lesion or focal pathologic process. Soft tissues and spinal canal: No prevertebral fluid or swelling. No visible canal hematoma. Disc levels: Degenerative disease with disc height loss at C4-5, C5-6, C6-7 and C7-T1. Mild bilateral facet arthropathy at C4-5, C5-6, C6-7 and C7-T1. Upper chest: Lung apices are clear. Other: No fluid collection or hematoma. Bilateral carotid artery atherosclerosis. IMPRESSION: 1. No acute intracranial pathology. 2.  No acute osseous injury of the cervical spine. 3. Cervical spine spondylosis as described above. Electronically Signed  By: Kathreen Devoid M.D.   On: 04/22/2021 09:38   US RENAL  Result Date: 04/23/2021 CLINICAL DATA:  Acute kidney injury EXAM: RENAL / URINARY TRACT ULTRASOUND COMPLETE COMPARISON:  None. FINDINGS: Right Kidney: Renal measurements: 11.8 x 5.9 x 4.3 cm = volume: 155 mL. Echogenicity within normal limits. No mass or hydronephrosis visualized. Right extrarenal pelvis. Left Kidney: Renal measurements: 10.6 x 6 x 5.5 cm = volume: 185 mL. Echogenicity within normal limits. No mass or hydronephrosis visualized. Bladder: No bladder wall thickening. Debris dependently along the bladder base. Other: None. IMPRESSION: 1. Normal renal ultrasound.  Electronically Signed   By: Kathreen Devoid M.D.   On: 04/23/2021 10:07   DG Chest Portable 1 View  Result Date: 04/22/2021 CLINICAL DATA:  Multiple falls over the last several days. EXAM: PORTABLE CHEST 1 VIEW COMPARISON:  None. FINDINGS: No focal consolidation. No pleural effusion or pneumothorax. Heart and mediastinal contours are unremarkable. No acute osseous abnormality. Right shoulder arthroplasty. Loss of the normal left acromiohumeral distance as can be seen with a chronic rotator cuff tear. IMPRESSION: No active disease. Electronically Signed   By: Kathreen Devoid M.D.   On: 04/22/2021 08:38   DG MINI C-ARM IMAGE ONLY  Result Date: 04/27/2021 There is no interpretation for this exam.  This order is for images obtained during a surgical procedure.  Please See "Surgeries" Tab for more information regarding the procedure.    Microbiology: Results for orders placed or performed during the hospital encounter of 04/22/21  Urine Culture     Status: Abnormal   Collection Time: 04/22/21  9:29 AM   Specimen: Urine, Clean Catch  Result Value Ref Range Status   Specimen Description   Final    URINE, CLEAN CATCH Performed at [facility], [address] [place], [place] [address]    Special Requests   Final    NONE Performed at [facility], [address] [place], [place] [address]    Culture >=100,000 COLONIES/mL ESCHERICHIA COLI (A)  Final   Report Status 04/25/2021 FINAL  Final   Organism ID, Bacteria ESCHERICHIA COLI (A)  Final      Susceptibility   Escherichia coli - MIC*    AMPICILLIN >=32 RESISTANT Resistant     CEFAZOLIN <=4 SENSITIVE Sensitive     CEFEPIME <=0.12 SENSITIVE Sensitive     CEFTRIAXONE <=0.25 SENSITIVE Sensitive     CIPROFLOXACIN <=0.25 SENSITIVE Sensitive     GENTAMICIN <=1 SENSITIVE Sensitive     IMIPENEM <=0.25 SENSITIVE Sensitive     NITROFURANTOIN <=16 SENSITIVE Sensitive     TRIMETH/SULFA <=20 SENSITIVE Sensitive     AMPICILLIN/SULBACTAM  16 INTERMEDIATE Intermediate     PIP/TAZO <=4 SENSITIVE Sensitive     * >=100,000 COLONIES/mL ESCHERICHIA COLI  Culture, blood (single)     Status: Abnormal   Collection Time: 04/22/21 10:18 AM   Specimen: BLOOD  Result Value Ref Range Status   Specimen Description   Final    BLOOD LEFT AC Performed at [facility], [address] [place], [place] [address]    Special Requests   Final    BOTTLES DRAWN AEROBIC AND ANAEROBIC Blood Culture adequate volume Performed at [facility], [address] [place],  [address]    Culture  Setup Time   Final    GRAM NEGATIVE RODS IN BOTH AEROBIC AND ANAEROBIC BOTTLES CRITICAL RESULT CALLED TO, READ BACK BY AND VERIFIED WITH: JASON ROBBINS @ 3570 ON 04/23/2021.Marland KitchenMarland KitchenTKR GRAM POSITIVE COCCI  ANAEROBIC BOTTLE ONLY CRITICAL RESULT CALLED TO, READ BACK BY AND VERIFIED WITH: D,MITCHELL PHARMD@1048  04/23/21 EB    Culture (A)  Final    ESCHERICHIA COLI STAPHYLOCOCCUS HOMINIS THE SIGNIFICANCE OF ISOLATING THIS ORGANISM FROM A SINGLE SET OF BLOOD CULTURES WHEN MULTIPLE SETS ARE DRAWN IS UNCERTAIN. PLEASE NOTIFY THE MICROBIOLOGY DEPARTMENT WITHIN ONE WEEK IF SPECIATION AND SENSITIVITIES ARE REQUIRED. Performed at [facility], [place] [address], [place], [place] [address]    Report Status 04/26/2021 FINAL  Final   Organism ID, Bacteria ESCHERICHIA COLI  Final      Susceptibility   Escherichia coli - MIC*    AMPICILLIN >=32 RESISTANT Resistant     CEFAZOLIN <=4 SENSITIVE Sensitive     CEFEPIME <=0.12 SENSITIVE Sensitive     CEFTAZIDIME <=1 SENSITIVE Sensitive     CEFTRIAXONE <=0.25 SENSITIVE Sensitive     CIPROFLOXACIN <=0.25 SENSITIVE Sensitive     GENTAMICIN <=1 SENSITIVE Sensitive     IMIPENEM 0.5 SENSITIVE Sensitive     TRIMETH/SULFA <=20 SENSITIVE Sensitive     AMPICILLIN/SULBACTAM 8 SENSITIVE Sensitive     PIP/TAZO <=4 SENSITIVE Sensitive     * ESCHERICHIA COLI  Blood Culture ID Panel (Reflexed)     Status: Abnormal    Collection Time: 04/22/21 10:18 AM  Result Value Ref Range Status   Enterococcus faecalis NOT DETECTED NOT DETECTED Final   Enterococcus Faecium NOT DETECTED NOT DETECTED Final   Listeria monocytogenes NOT DETECTED NOT DETECTED Final   Staphylococcus species NOT DETECTED NOT DETECTED Final   Staphylococcus aureus (BCID) NOT DETECTED NOT DETECTED Final   Staphylococcus epidermidis NOT DETECTED NOT DETECTED Final   Staphylococcus lugdunensis NOT DETECTED NOT DETECTED Final   Streptococcus species NOT DETECTED NOT DETECTED Final   Streptococcus agalactiae NOT DETECTED NOT DETECTED Final   Streptococcus pneumoniae NOT DETECTED NOT DETECTED Final   Streptococcus pyogenes NOT DETECTED NOT DETECTED Final   A.calcoaceticus-baumannii NOT DETECTED NOT DETECTED Final   Bacteroides fragilis NOT DETECTED NOT DETECTED Final   Enterobacterales DETECTED (A) NOT DETECTED Final    Comment: Enterobacterales represent a large order of gram negative bacteria, not a single organism. CRITICAL RESULT CALLED TO, READ BACK BY AND VERIFIED WITH: JASON ROBBINS @ 2353 ON 04/23/2021.Marland KitchenMarland KitchenTKR    Enterobacter cloacae complex NOT DETECTED NOT DETECTED Final   Escherichia coli DETECTED (A) NOT DETECTED Final    Comment: CRITICAL RESULT CALLED TO, READ BACK BY AND VERIFIED WITH: JASON ROBBINS @ 6144 ON 04/23/2021.Marland KitchenMarland KitchenTKR    Klebsiella aerogenes NOT DETECTED NOT DETECTED Final   Klebsiella oxytoca NOT DETECTED NOT DETECTED Final   Klebsiella pneumoniae NOT DETECTED NOT DETECTED Final   Proteus species NOT DETECTED NOT DETECTED Final   Salmonella species NOT DETECTED NOT DETECTED Final   Serratia marcescens NOT DETECTED NOT DETECTED Final   Haemophilus influenzae NOT DETECTED NOT DETECTED Final   Neisseria meningitidis NOT DETECTED NOT DETECTED Final   Pseudomonas aeruginosa NOT DETECTED NOT DETECTED Final   Stenotrophomonas maltophilia NOT DETECTED NOT DETECTED Final   Candida albicans NOT DETECTED NOT DETECTED Final    Candida auris NOT DETECTED NOT DETECTED Final   Candida glabrata NOT DETECTED NOT DETECTED Final   Candida krusei NOT DETECTED NOT DETECTED Final   Candida parapsilosis NOT DETECTED NOT DETECTED Final   Candida tropicalis NOT DETECTED NOT DETECTED Final   Cryptococcus neoformans/gattii NOT DETECTED NOT DETECTED Final   CTX-M ESBL NOT DETECTED NOT DETECTED Final   Carbapenem resistance IMP NOT DETECTED NOT DETECTED Final  Carbapenem resistance KPC NOT DETECTED NOT DETECTED Final   Carbapenem resistance NDM NOT DETECTED NOT DETECTED Final   Carbapenem resist OXA 48 LIKE NOT DETECTED NOT DETECTED Final   Carbapenem resistance VIM NOT DETECTED NOT DETECTED Final    Comment: Performed at [facility], [address] [place], [place] [address]  Blood Culture ID Panel (Reflexed)     Status: Abnormal   Collection Time: 04/22/21 10:18 AM  Result Value Ref Range Status   Enterococcus faecalis NOT DETECTED NOT DETECTED Final   Enterococcus Faecium NOT DETECTED NOT DETECTED Final   Listeria monocytogenes NOT DETECTED NOT DETECTED Final   Staphylococcus species DETECTED (A) NOT DETECTED Final    Comment: CRITICAL RESULT CALLED TO, READ BACK BY AND VERIFIED WITH: D,MITCHELL PHARMD@1048  04/23/21 EB    Staphylococcus aureus (BCID) NOT DETECTED NOT DETECTED Final   Staphylococcus epidermidis NOT DETECTED NOT DETECTED Final   Staphylococcus lugdunensis NOT DETECTED NOT DETECTED Final   Streptococcus species NOT DETECTED NOT DETECTED Final   Streptococcus agalactiae NOT DETECTED NOT DETECTED Final   Streptococcus pneumoniae NOT DETECTED NOT DETECTED Final   Streptococcus pyogenes NOT DETECTED NOT DETECTED Final   A.calcoaceticus-baumannii NOT DETECTED NOT DETECTED Final   Bacteroides fragilis NOT DETECTED NOT DETECTED Final   Enterobacterales DETECTED (A) NOT DETECTED Final    Comment: Enterobacterales represent a large order of gram negative bacteria, not a single organism. CRITICAL RESULT  CALLED TO, READ BACK BY AND VERIFIED WITH: D,MITCHELL PHARMD@1048  04/23/21 EB    Enterobacter cloacae complex NOT DETECTED NOT DETECTED Final   Escherichia coli DETECTED (A) NOT DETECTED Final    Comment: CRITICAL RESULT CALLED TO, READ BACK BY AND VERIFIED WITH: D,MITCHELL PHARMD@1048  04/23/21 EB    Klebsiella aerogenes NOT DETECTED NOT DETECTED Final   Klebsiella oxytoca NOT DETECTED NOT DETECTED Final   Klebsiella pneumoniae NOT DETECTED NOT DETECTED Final   Proteus species NOT DETECTED NOT DETECTED Final   Salmonella species NOT DETECTED NOT DETECTED Final   Serratia marcescens NOT DETECTED NOT DETECTED Final   Haemophilus influenzae NOT DETECTED NOT DETECTED Final   Neisseria meningitidis NOT DETECTED NOT DETECTED Final   Pseudomonas aeruginosa NOT DETECTED NOT DETECTED Final   Stenotrophomonas maltophilia NOT DETECTED NOT DETECTED Final   Candida albicans NOT DETECTED NOT DETECTED Final   Candida auris NOT DETECTED NOT DETECTED Final   Candida glabrata NOT DETECTED NOT DETECTED Final   Candida krusei NOT DETECTED NOT DETECTED Final   Candida parapsilosis NOT DETECTED NOT DETECTED Final   Candida tropicalis NOT DETECTED NOT DETECTED Final   Cryptococcus neoformans/gattii NOT DETECTED NOT DETECTED Final   CTX-M ESBL NOT DETECTED NOT DETECTED Final   Carbapenem resistance IMP NOT DETECTED NOT DETECTED Final   Carbapenem resistance KPC NOT DETECTED NOT DETECTED Final   Carbapenem resistance NDM NOT DETECTED NOT DETECTED Final   Carbapenem resist OXA 48 LIKE NOT DETECTED NOT DETECTED Final   Carbapenem resistance VIM NOT DETECTED NOT DETECTED Final    Comment: Performed at [facility], [address], [place], [place] [address]  Resp Panel by RT-PCR (Flu A&B, Covid) Nasopharyngeal Swab     Status: None   Collection Time: 04/22/21  5:07 PM   Specimen: Nasopharyngeal Swab; Nasopharyngeal(NP) swabs in vial transport medium  Result Value Ref Range Status   SARS Coronavirus 2 by RT  PCR NEGATIVE NEGATIVE Final    Comment: (NOTE) SARS-CoV-2 target nucleic acids are NOT DETECTED.  The SARS-CoV-2 RNA is generally detectable in  upper respiratory specimens during the acute phase of infection. The lowest concentration of SARS-CoV-2 viral copies this assay can detect is 138 copies/mL. A negative result does not preclude SARS-Cov-2 infection and should not be used as the sole basis for treatment or other patient management decisions. A negative result may occur with  improper specimen collection/handling, submission of specimen other than nasopharyngeal swab, presence of viral mutation(s) within the areas targeted by this assay, and inadequate number of viral copies(<138 copies/mL). A negative result must be combined with clinical observations, patient history, and epidemiological information. The expected result is Negative.  Fact Sheet for Patients:  EntrepreneurPulse.com.au  Fact Sheet for Healthcare Providers:  IncredibleEmployment.be  This test is no t yet approved or cleared by the [place] FDA and  has been authorized for detection and/or diagnosis of SARS-CoV-2 by FDA under an Emergency Use Authorization (EUA). This EUA will remain  in effect (meaning this test can be used) for the duration of the COVID-19 declaration under Section 564(b)(1) of the Act, 21 U.S.C.section 360bbb-3(b)(1), unless the authorization is terminated  or revoked sooner.       Influenza A by PCR NEGATIVE NEGATIVE Final   Influenza B by PCR NEGATIVE NEGATIVE Final    Comment: (NOTE) The Xpert Xpress SARS-CoV-2/FLU/RSV plus assay is intended as an aid in the diagnosis of influenza from Nasopharyngeal swab specimens and should not be used as a sole basis for treatment. Nasal washings and aspirates are unacceptable for Xpert Xpress SARS-CoV-2/FLU/RSV testing.  Fact Sheet for Patients: EntrepreneurPulse.com.au  Fact Sheet for  Healthcare Providers: IncredibleEmployment.be  This test is not yet approved or cleared by the [place] FDA and has been authorized for detection and/or diagnosis of SARS-CoV-2 by FDA under an Emergency Use Authorization (EUA). This EUA will remain in effect (meaning this test can be used) for the duration of the COVID-19 declaration under Section 564(b)(1) of the Act, 21 U.S.C. section 360bbb-3(b)(1), unless the authorization is terminated or revoked.  Performed at [facility], [address], [place], [place] [address]   Surgical PCR screen     Status: None   Collection Time: 04/22/21  5:10 PM   Specimen: Nasal Mucosa; Nasal Swab  Result Value Ref Range Status   MRSA, PCR NEGATIVE NEGATIVE Final   Staphylococcus aureus NEGATIVE NEGATIVE Final    Comment: (NOTE) The Xpert SA Assay (FDA approved for NASAL specimens in patients 75 years of age and older), is one component of a comprehensive surveillance program. It is not intended to diagnose infection nor to guide or monitor treatment. Performed at [facility], [address] [place], [place] [address]   MRSA Next Gen by PCR, Nasal     Status: None   Collection Time: 04/22/21  6:42 PM   Specimen: Nasal Mucosa; Nasal Swab  Result Value Ref Range Status   MRSA by PCR Next Gen NOT DETECTED NOT DETECTED Final    Comment: (NOTE) The GeneXpert MRSA Assay (FDA approved for NASAL specimens only), is one component of a comprehensive MRSA colonization surveillance program. It is not intended to diagnose MRSA infection nor to guide or monitor treatment for MRSA infections. Test performance is not FDA approved in patients less than 25 years old. Performed at [facility], [place], [place] [address]   CULTURE, BLOOD (ROUTINE X 2) w Reflex to ID Panel     Status: None   Collection Time: 04/22/21  6:48 PM   Specimen: BLOOD  Result Value  Ref Range Status   Specimen  Description BLOOD BLOOD RIGHT HAND  Final   Special Requests   Final    BOTTLES DRAWN AEROBIC AND ANAEROBIC Blood Culture adequate volume   Culture   Final    NO GROWTH 5 DAYS Performed at [facility], [address] [place], [place] [address]    Report Status 04/27/2021 FINAL  Final  CULTURE, BLOOD (ROUTINE X 2) w Reflex to ID Panel     Status: None   Collection Time: 04/22/21  7:03 PM   Specimen: BLOOD  Result Value Ref Range Status   Specimen Description BLOOD BLOOD LEFT HAND  Final   Special Requests   Final    BOTTLES DRAWN AEROBIC AND ANAEROBIC Blood Culture adequate volume   Culture   Final    NO GROWTH 5 DAYS Performed at [facility], [address] [place], [place] [address]    Report Status 04/27/2021 FINAL  Final    Labs: CBC: Recent Labs  Lab 04/23/21 0324 04/24/21 0553 04/25/21 0834 04/26/21 0802 04/28/21 0310  WBC 25.1* 24.2* 22.9* 16.6* 10.0  NEUTROABS  --  20.8*  --  13.6* 9.3*  HGB 10.9* 11.0* 12.4 11.3* 10.8*  HCT 31.2* 32.4* 35.7* 32.4* 31.0*  MCV 98.7 100.6* 99.7 98.2 98.1  PLT 296 352 447* 463* 301*   Basic Metabolic Panel: Recent Labs  Lab 04/26/21 0802 04/27/21 0959 04/27/21 2004 04/28/21 0310 04/29/21 0320  NA 136 134* 134* 134* 136  K 4.7 5.2* 5.6* 5.0 4.7  CL 109 104 102 102 108  CO2 20* 24 23 23 24   GLUCOSE 80 101* 170* 204* 96  BUN 38* 47* 45* 50* 49*  CREATININE 1.85* 2.26* 2.23* 2.14* 2.01*  CALCIUM 7.2* 8.2* 8.1* 7.7* 7.3*  MG 1.8  --   --  2.0 2.0   Liver Function Tests: No results for input(s): AST, ALT, ALKPHOS, BILITOT, PROT, ALBUMIN in the last 168 hours. CBG: No results for input(s): GLUCAP in the last 168 hours.  Discharge time spent: greater than 30 minutes.  Signed: Fritzi Mandes, MD [facility] 04/29/2021

## 2021-04-29 NOTE — TOC Progression Note (Addendum)
Transition of Care (TOC) - Progression Note  ? ? ?Patient Details  ?Name: Thai Hemrick ?MRN: [id number] ?Date of Birth: [birth year] ? ?Transition of Care (TOC) CM/SW Contact  ?Conception Oms, RN ?Phone Number: ?04/29/2021, 10:34 AM ? ?Clinical Narrative:   Ins auth approved Auth number [id number] ? ? ? ?Expected Discharge Plan: [place] ?Barriers to Discharge: Continued Medical Work up ? ?Expected Discharge Plan and Services ?Expected Discharge Plan: [place] ?  ?  ?  ?  ?                ?  ?  ?  ?  ?  ?  ?  ?  ?  ?  ? ? ?Social Determinants of Health (SDOH) Interventions ?  ? ?Readmission Risk Interventions ?No flowsheet data found. ? ?

## 2021-04-29 NOTE — TOC Progression Note (Signed)
Transition of Care (TOC) - Progression Note  ? ? ?Patient Details  ?Name: Lynn Norton ?MRN: [id number] ?Date of Birth: 01-20-1951 ? ?Transition of Care (TOC) CM/SW Contact  ?Conception Oms, RN ?Phone Number: ?04/29/2021, 10:11 AM ? ?Clinical Narrative:   Reviewed the bed choices with the patient and she chose [facility], I notified Magda Paganini at [facility], I started [employer]  ? ? ? ?Expected Discharge Plan: [place] ?Barriers to Discharge: Continued Medical Work up ? ?Expected Discharge Plan and Services ?Expected Discharge Plan: [place] ?  ?  ?  ?  ?                ?  ?  ?  ?  ?  ?  ?  ?  ?  ?  ? ? ?Social Determinants of Health (SDOH) Interventions ?  ? ?Readmission Risk Interventions ?No flowsheet data found. ? ?

## 2021-04-29 NOTE — Progress Notes (Signed)
[facility]  ROUNDING NOTE   Subjective:   Patient seen sitting up in chair Family friend at bedside Alert and oriented Tolerating meals without difficulty Remains on room air  Creatinine 2.01 Urine output of 1.4 L in 24 hours  Objective:  Vital signs in last 24 hours:  Temp:  [97.6 F (36.4 C)-98.3 F (36.8 C)] 97.6 F (36.4 C) (03/08 1148) Pulse Rate:  [87-97] 87 (03/08 1148) Resp:  [16-20] 20 (03/08 1148) BP: (115-145)/(56-68) 122/60 (03/08 1148) SpO2:  [85 %-93 %] 93 % (03/08 1148)  Weight change:  Filed Weights   04/22/21 1100 04/22/21 1900 04/27/21 1501  Weight: 45.8 kg 46.1 kg 46.1 kg    Intake/Output: I/O last 3 completed shifts: In: 0  Out: 2000 [Urine:2000]   Intake/Output this shift:  Total I/O In: -  Out: 550 [Urine:550]  Physical Exam: General: NAD  Head: Normocephalic, atraumatic. Moist oral mucosal membranes  Eyes: Anicteric  Lungs:  Clear to auscultation, normal effort  Heart: Regular rate and rhythm  Abdomen:  Soft, nontender  Extremities: No peripheral edema.  Neurologic: Nonfocal, moving all four extremities  Skin: No lesions       Basic Metabolic Panel: Recent Labs  Lab 04/26/21 0802 04/27/21 0959 04/27/21 2004 04/28/21 0310 04/29/21 0320  NA 136 134* 134* 134* 136  K 4.7 5.2* 5.6* 5.0 4.7  CL 109 104 102 102 108  CO2 20* 24 23 23 24   GLUCOSE 80 101* 170* 204* 96  BUN 38* 47* 45* 50* 49*  CREATININE 1.85* 2.26* 2.23* 2.14* 2.01*  CALCIUM 7.2* 8.2* 8.1* 7.7* 7.3*  MG 1.8  --   --  2.0 2.0     Liver Function Tests: No results for input(s): AST, ALT, ALKPHOS, BILITOT, PROT, ALBUMIN in the last 168 hours.  No results for input(s): LIPASE, AMYLASE in the last 168 hours. No results for input(s): AMMONIA in the last 168 hours.  CBC: Recent Labs  Lab 04/23/21 0324 04/24/21 0553 04/25/21 0834 04/26/21 0802 04/28/21 0310  WBC 25.1* 24.2* 22.9* 16.6* 10.0  NEUTROABS  --  20.8*  --  13.6* 9.3*  HGB 10.9*  11.0* 12.4 11.3* 10.8*  HCT 31.2* 32.4* 35.7* 32.4* 31.0*  MCV 98.7 100.6* 99.7 98.2 98.1  PLT 296 352 447* 463* 567*     Cardiac Enzymes: No results for input(s): CKTOTAL, CKMB, CKMBINDEX, TROPONINI in the last 168 hours.   BNP: Invalid input(s): POCBNP  CBG: No results for input(s): GLUCAP in the last 168 hours.  Microbiology: Results for orders placed or performed during the hospital encounter of 04/22/21  Urine Culture     Status: Abnormal   Collection Time: 04/22/21  9:29 AM   Specimen: Urine, Clean Catch  Result Value Ref Range Status   Specimen Description   Final    URINE, CLEAN CATCH Performed at [facility], [address], [place], [place] [address]    Special Requests   Final    NONE Performed at [facility], [address] [place],  [address]    Culture >=100,000 COLONIES/mL ESCHERICHIA COLI (A)  Final   Report Status 04/25/2021 FINAL  Final   Organism ID, Bacteria ESCHERICHIA COLI (A)  Final      Susceptibility   Escherichia coli - MIC*    AMPICILLIN >=32 RESISTANT Resistant     CEFAZOLIN <=4 SENSITIVE Sensitive     CEFEPIME <=0.12 SENSITIVE Sensitive     CEFTRIAXONE <=0.25 SENSITIVE Sensitive     CIPROFLOXACIN <=  0.25 SENSITIVE Sensitive     GENTAMICIN <=1 SENSITIVE Sensitive     IMIPENEM <=0.25 SENSITIVE Sensitive     NITROFURANTOIN <=16 SENSITIVE Sensitive     TRIMETH/SULFA <=20 SENSITIVE Sensitive     AMPICILLIN/SULBACTAM 16 INTERMEDIATE Intermediate     PIP/TAZO <=4 SENSITIVE Sensitive     * >=100,000 COLONIES/mL ESCHERICHIA COLI  Culture, blood (single)     Status: Abnormal   Collection Time: 04/22/21 10:18 AM   Specimen: BLOOD  Result Value Ref Range Status   Specimen Description   Final    BLOOD LEFT AC Performed at [facility], [address], [place], [place] [address]    Special Requests   Final    BOTTLES DRAWN AEROBIC AND ANAEROBIC Blood Culture adequate volume Performed at [facility], [address] [place], [place] [address]    Culture  Setup Time   Final    GRAM NEGATIVE RODS IN BOTH AEROBIC AND ANAEROBIC BOTTLES CRITICAL RESULT CALLED TO, READ BACK BY AND VERIFIED WITH: JASON ROBBINS @ [phone] ON 04/23/2021.Marland KitchenMarland KitchenTKR GRAM POSITIVE COCCI ANAEROBIC BOTTLE ONLY CRITICAL RESULT CALLED TO, READ BACK BY AND VERIFIED WITH: D,MITCHELL PHARMD@1048  04/23/21 EB    Culture (A)  Final    ESCHERICHIA COLI STAPHYLOCOCCUS HOMINIS THE SIGNIFICANCE OF ISOLATING THIS ORGANISM FROM A SINGLE SET OF BLOOD CULTURES WHEN MULTIPLE SETS ARE DRAWN IS UNCERTAIN. PLEASE NOTIFY THE MICROBIOLOGY DEPARTMENT WITHIN ONE WEEK IF SPECIATION AND SENSITIVITIES ARE REQUIRED. Performed at [facility], [place] [address] [place], [place] [address]    Report Status 04/26/2021 FINAL  Final   Organism ID, Bacteria ESCHERICHIA COLI  Final      Susceptibility   Escherichia coli - MIC*    AMPICILLIN >=32 RESISTANT Resistant     CEFAZOLIN <=4 SENSITIVE Sensitive     CEFEPIME <=0.12 SENSITIVE Sensitive     CEFTAZIDIME <=1 SENSITIVE Sensitive     CEFTRIAXONE <=0.25 SENSITIVE Sensitive     CIPROFLOXACIN <=0.25 SENSITIVE Sensitive     GENTAMICIN <=1 SENSITIVE Sensitive     IMIPENEM 0.5 SENSITIVE Sensitive     TRIMETH/SULFA <=20 SENSITIVE Sensitive     AMPICILLIN/SULBACTAM 8 SENSITIVE Sensitive     PIP/TAZO <=4 SENSITIVE Sensitive     * ESCHERICHIA COLI  Blood Culture ID Panel (Reflexed)     Status: Abnormal   Collection Time: 04/22/21 10:18 AM  Result Value Ref Range Status   Enterococcus faecalis NOT DETECTED NOT DETECTED Final   Enterococcus Faecium NOT DETECTED NOT DETECTED Final   Listeria monocytogenes NOT DETECTED NOT DETECTED Final   Staphylococcus species NOT DETECTED NOT DETECTED Final   Staphylococcus aureus (BCID) NOT DETECTED NOT DETECTED Final   Staphylococcus epidermidis NOT DETECTED NOT DETECTED Final   Staphylococcus lugdunensis NOT DETECTED NOT DETECTED Final   Streptococcus species  NOT DETECTED NOT DETECTED Final   Streptococcus agalactiae NOT DETECTED NOT DETECTED Final   Streptococcus pneumoniae NOT DETECTED NOT DETECTED Final   Streptococcus pyogenes NOT DETECTED NOT DETECTED Final   A.calcoaceticus-baumannii NOT DETECTED NOT DETECTED Final   Bacteroides fragilis NOT DETECTED NOT DETECTED Final   Enterobacterales DETECTED (A) NOT DETECTED Final    Comment: Enterobacterales represent a large order of gram negative bacteria, not a single organism. CRITICAL RESULT CALLED TO, READ BACK BY AND VERIFIED WITH: JASON ROBBINS @ 2620 ON 04/23/2021.Marland KitchenMarland KitchenTKR    Enterobacter cloacae complex NOT DETECTED NOT DETECTED Final   Escherichia coli DETECTED (A) NOT DETECTED Final    Comment: CRITICAL RESULT CALLED TO,  READ BACK BY AND VERIFIED WITH: JASON ROBBINS @ [phone] ON 04/23/2021.Marland KitchenMarland KitchenTKR    Klebsiella aerogenes NOT DETECTED NOT DETECTED Final   Klebsiella oxytoca NOT DETECTED NOT DETECTED Final   Klebsiella pneumoniae NOT DETECTED NOT DETECTED Final   Proteus species NOT DETECTED NOT DETECTED Final   Salmonella species NOT DETECTED NOT DETECTED Final   Serratia marcescens NOT DETECTED NOT DETECTED Final   Haemophilus influenzae NOT DETECTED NOT DETECTED Final   Neisseria meningitidis NOT DETECTED NOT DETECTED Final   Pseudomonas aeruginosa NOT DETECTED NOT DETECTED Final   Stenotrophomonas maltophilia NOT DETECTED NOT DETECTED Final   Candida albicans NOT DETECTED NOT DETECTED Final   Candida auris NOT DETECTED NOT DETECTED Final   Candida glabrata NOT DETECTED NOT DETECTED Final   Candida krusei NOT DETECTED NOT DETECTED Final   Candida parapsilosis NOT DETECTED NOT DETECTED Final   Candida tropicalis NOT DETECTED NOT DETECTED Final   Cryptococcus neoformans/gattii NOT DETECTED NOT DETECTED Final   CTX-M ESBL NOT DETECTED NOT DETECTED Final   Carbapenem resistance IMP NOT DETECTED NOT DETECTED Final   Carbapenem resistance KPC NOT DETECTED NOT DETECTED Final   Carbapenem  resistance NDM NOT DETECTED NOT DETECTED Final   Carbapenem resist OXA 48 LIKE NOT DETECTED NOT DETECTED Final   Carbapenem resistance VIM NOT DETECTED NOT DETECTED Final    Comment: Performed at [facility], [address] [place], [place] [address]  Blood Culture ID Panel (Reflexed)     Status: Abnormal   Collection Time: 04/22/21 10:18 AM  Result Value Ref Range Status   Enterococcus faecalis NOT DETECTED NOT DETECTED Final   Enterococcus Faecium NOT DETECTED NOT DETECTED Final   Listeria monocytogenes NOT DETECTED NOT DETECTED Final   Staphylococcus species DETECTED (A) NOT DETECTED Final    Comment: CRITICAL RESULT CALLED TO, READ BACK BY AND VERIFIED WITH: D,MITCHELL PHARMD@1048  04/23/21 EB    Staphylococcus aureus (BCID) NOT DETECTED NOT DETECTED Final   Staphylococcus epidermidis NOT DETECTED NOT DETECTED Final   Staphylococcus lugdunensis NOT DETECTED NOT DETECTED Final   Streptococcus species NOT DETECTED NOT DETECTED Final   Streptococcus agalactiae NOT DETECTED NOT DETECTED Final   Streptococcus pneumoniae NOT DETECTED NOT DETECTED Final   Streptococcus pyogenes NOT DETECTED NOT DETECTED Final   A.calcoaceticus-baumannii NOT DETECTED NOT DETECTED Final   Bacteroides fragilis NOT DETECTED NOT DETECTED Final   Enterobacterales DETECTED (A) NOT DETECTED Final    Comment: Enterobacterales represent a large order of gram negative bacteria, not a single organism. CRITICAL RESULT CALLED TO, READ BACK BY AND VERIFIED WITH: D,MITCHELL PHARMD@1048  04/23/21 EB    Enterobacter cloacae complex NOT DETECTED NOT DETECTED Final   Escherichia coli DETECTED (A) NOT DETECTED Final    Comment: CRITICAL RESULT CALLED TO, READ BACK BY AND VERIFIED WITH: D,MITCHELL PHARMD@1048  04/23/21 EB    Klebsiella aerogenes NOT DETECTED NOT DETECTED Final   Klebsiella oxytoca NOT DETECTED NOT DETECTED Final   Klebsiella pneumoniae NOT DETECTED NOT DETECTED Final   Proteus species NOT DETECTED NOT  DETECTED Final   Salmonella species NOT DETECTED NOT DETECTED Final   Serratia marcescens NOT DETECTED NOT DETECTED Final   Haemophilus influenzae NOT DETECTED NOT DETECTED Final   Neisseria meningitidis NOT DETECTED NOT DETECTED Final   Pseudomonas aeruginosa NOT DETECTED NOT DETECTED Final   Stenotrophomonas maltophilia NOT DETECTED NOT DETECTED Final   Candida albicans NOT DETECTED NOT DETECTED Final   Candida auris NOT DETECTED NOT DETECTED Final   Candida glabrata NOT DETECTED NOT DETECTED  Final   Candida krusei NOT DETECTED NOT DETECTED Final   Candida parapsilosis NOT DETECTED NOT DETECTED Final   Candida tropicalis NOT DETECTED NOT DETECTED Final   Cryptococcus neoformans/gattii NOT DETECTED NOT DETECTED Final   CTX-M ESBL NOT DETECTED NOT DETECTED Final   Carbapenem resistance IMP NOT DETECTED NOT DETECTED Final   Carbapenem resistance KPC NOT DETECTED NOT DETECTED Final   Carbapenem resistance NDM NOT DETECTED NOT DETECTED Final   Carbapenem resist OXA 48 LIKE NOT DETECTED NOT DETECTED Final   Carbapenem resistance VIM NOT DETECTED NOT DETECTED Final    Comment: Performed at [facility], [place] [address] [place], [place] [address]  Resp Panel by RT-PCR (Flu A&B, Covid) Nasopharyngeal Swab     Status: None   Collection Time: 04/22/21  5:07 PM   Specimen: Nasopharyngeal Swab; Nasopharyngeal(NP) swabs in vial transport medium  Result Value Ref Range Status   SARS Coronavirus 2 by RT PCR NEGATIVE NEGATIVE Final    Comment: (NOTE) SARS-CoV-2 target nucleic acids are NOT DETECTED.  The SARS-CoV-2 RNA is generally detectable in upper respiratory specimens during the acute phase of infection. The lowest concentration of SARS-CoV-2 viral copies this assay can detect is 138 copies/mL. A negative result does not preclude SARS-Cov-2 infection and should not be used as the Norton basis for treatment or other patient management decisions. A negative result may occur with  improper  specimen collection/handling, submission of specimen other than nasopharyngeal swab, presence of viral mutation(s) within the areas targeted by this assay, and inadequate number of viral copies(<138 copies/mL). A negative result must be combined with clinical observations, patient history, and epidemiological information. The expected result is Negative.  Fact Sheet for Patients:  EntrepreneurPulse.com.au  Fact Sheet for Healthcare Providers:  IncredibleEmployment.be  This test is no t yet approved or cleared by the [place] FDA and  has been authorized for detection and/or diagnosis of SARS-CoV-2 by FDA under an Emergency Use Authorization (EUA). This EUA will remain  in effect (meaning this test can be used) for the duration of the COVID-19 declaration under Section 564(b)(1) of the Act, 21 U.S.C.section 360bbb-3(b)(1), unless the authorization is terminated  or revoked sooner.       Influenza A by PCR NEGATIVE NEGATIVE Final   Influenza B by PCR NEGATIVE NEGATIVE Final    Comment: (NOTE) The Xpert Xpress SARS-CoV-2/FLU/RSV plus assay is intended as an aid in the diagnosis of influenza from Nasopharyngeal swab specimens and should not be used as a Norton basis for treatment. Nasal washings and aspirates are unacceptable for Xpert Xpress SARS-CoV-2/FLU/RSV testing.  Fact Sheet for Patients: EntrepreneurPulse.com.au  Fact Sheet for Healthcare Providers: IncredibleEmployment.be  This test is not yet approved or cleared by the [place] FDA and has been authorized for detection and/or diagnosis of SARS-CoV-2 by FDA under an Emergency Use Authorization (EUA). This EUA will remain in effect (meaning this test can be used) for the duration of the COVID-19 declaration under Section 564(b)(1) of the Act, 21 U.S.C. section 360bbb-3(b)(1), unless the authorization is terminated or revoked.  Performed at  [facility], [address], [place], [place] [address]   Surgical PCR screen     Status: None   Collection Time: 04/22/21  5:10 PM   Specimen: Nasal Mucosa; Nasal Swab  Result Value Ref Range Status   MRSA, PCR NEGATIVE NEGATIVE Final   Staphylococcus aureus NEGATIVE NEGATIVE Final    Comment: (NOTE) The Xpert SA Assay (FDA approved for NASAL specimens  in patients 52 years of age and older), is one component of a comprehensive surveillance program. It is not intended to diagnose infection nor to guide or monitor treatment. Performed at [facility], [address] [place], [place] [address]   MRSA Next Gen by PCR, Nasal     Status: None   Collection Time: 04/22/21  6:42 PM   Specimen: Nasal Mucosa; Nasal Swab  Result Value Ref Range Status   MRSA by PCR Next Gen NOT DETECTED NOT DETECTED Final    Comment: (NOTE) The GeneXpert MRSA Assay (FDA approved for NASAL specimens only), is one component of a comprehensive MRSA colonization surveillance program. It is not intended to diagnose MRSA infection nor to guide or monitor treatment for MRSA infections. Test performance is not FDA approved in patients less than 56 years old. Performed at [facility], [address] [place], [place] [address]   CULTURE, BLOOD (ROUTINE X 2) w Reflex to ID Panel     Status: None   Collection Time: 04/22/21  6:48 PM   Specimen: BLOOD  Result Value Ref Range Status   Specimen Description BLOOD BLOOD RIGHT HAND  Final   Special Requests   Final    BOTTLES DRAWN AEROBIC AND ANAEROBIC Blood Culture adequate volume   Culture   Final    NO GROWTH 5 DAYS Performed at [facility], [address] [place], [place] [address]    Report Status 04/27/2021 FINAL  Final  CULTURE, BLOOD (ROUTINE X 2) w Reflex to ID Panel     Status: None   Collection Time: 04/22/21  7:03 PM   Specimen: BLOOD  Result Value Ref Range Status   Specimen Description BLOOD BLOOD LEFT  HAND  Final   Special Requests   Final    BOTTLES DRAWN AEROBIC AND ANAEROBIC Blood Culture adequate volume   Culture   Final    NO GROWTH 5 DAYS Performed at [facility], [address] [place],  [address]    Report Status 04/27/2021 FINAL  Final  Resp Panel by RT-PCR (Flu A&B, Covid) Nasopharyngeal Swab     Status: None   Collection Time: 04/29/21 10:11 AM   Specimen: Nasopharyngeal Swab; Nasopharyngeal(NP) swabs in vial transport medium  Result Value Ref Range Status   SARS Coronavirus 2 by RT PCR NEGATIVE NEGATIVE Final    Comment: (NOTE) SARS-CoV-2 target nucleic acids are NOT DETECTED.  The SARS-CoV-2 RNA is generally detectable in upper respiratory specimens during the acute phase of infection. The lowest concentration of SARS-CoV-2 viral copies this assay can detect is 138 copies/mL. A negative result does not preclude SARS-Cov-2 infection and should not be used as the Norton basis for treatment or other patient management decisions. A negative result may occur with  improper specimen collection/handling, submission of specimen other than nasopharyngeal swab, presence of viral mutation(s) within the areas targeted by this assay, and inadequate number of viral copies(<138 copies/mL). A negative result must be combined with clinical observations, patient history, and epidemiological information. The expected result is Negative.  Fact Sheet for Patients:  EntrepreneurPulse.com.au  Fact Sheet for Healthcare Providers:  IncredibleEmployment.be  This test is no t yet approved or cleared by the [place] FDA and  has been authorized for detection and/or diagnosis of SARS-CoV-2 by FDA under an Emergency Use Authorization (EUA). This EUA will remain  in effect (meaning this test can be used) for the duration of the COVID-19 declaration under Section 564(b)(1) of the Act,  21 U.S.C.section 360bbb-3(b)(1), unless the  authorization is terminated  or revoked sooner.       Influenza A by PCR NEGATIVE NEGATIVE Final   Influenza B by PCR NEGATIVE NEGATIVE Final    Comment: (NOTE) The Xpert Xpress SARS-CoV-2/FLU/RSV plus assay is intended as an aid in the diagnosis of influenza from Nasopharyngeal swab specimens and should not be used as a Norton basis for treatment. Nasal washings and aspirates are unacceptable for Xpert Xpress SARS-CoV-2/FLU/RSV testing.  Fact Sheet for Patients: EntrepreneurPulse.com.au  Fact Sheet for Healthcare Providers: IncredibleEmployment.be  This test is not yet approved or cleared by the [place] FDA and has been authorized for detection and/or diagnosis of SARS-CoV-2 by FDA under an Emergency Use Authorization (EUA). This EUA will remain in effect (meaning this test can be used) for the duration of the COVID-19 declaration under Section 564(b)(1) of the Act, 21 U.S.C. section 360bbb-3(b)(1), unless the authorization is terminated or revoked.  Performed at [facility], [address] [place], [place] [address]     Coagulation Studies: No results for input(s): LABPROT, INR in the last 72 hours.  Urinalysis: No results for input(s): COLORURINE, LABSPEC, PHURINE, GLUCOSEU, HGBUR, BILIRUBINUR, KETONESUR, PROTEINUR, UROBILINOGEN, NITRITE, LEUKOCYTESUR in the last 72 hours.  Invalid input(s): APPERANCEUR     Imaging: DG MINI C-ARM IMAGE ONLY  Result Date: 04/27/2021 There is no interpretation for this exam.  This order is for images obtained during a surgical procedure.  Please See "Surgeries" Tab for more information regarding the procedure.     Medications:       amLODipine  5 mg Oral Daily   cephALEXin  500 mg Oral Q12H   feeding supplement  237 mL Oral BID BM   heparin  5,000 Units Subcutaneous Q8H   multivitamin with minerals  1 tablet Oral Daily   nicotine  14 mg Transdermal Daily   traZODone  50 mg  Oral QHS   oxyCODONE, polyethylene glycol  Assessment/ Plan:  Lynn Norton is a 71 y.o.  female with past medical conditions including arthritis, hematuria, hypertension, OA, RA, hepatitis, and osteoporosis, who was admitted to [facility] on 04/22/2021 for Weakness [R53.1] Fall, initial encounter [W19.XXXA] Elbow fracture, right, closed, initial encounter [S42.401A] Sepsis ([place]) [A41.9] Urinary tract infection without hematuria, site unspecified [N39.0] Sepsis, due to unspecified organism, unspecified whether acute organ dysfunction present ([place]) [A41.9]   Acute kidney injury likely secondary to progression of urinary tract infection.  Normal renal function July 2022.  Renal ultrasound negative for obstruction.  No IV contrast exposure.    Creatinine continues to slowly improve with adequate urine output noted.  Patient scheduled for discharge to rehab today.  Patient would prefer follow-up with PCP to lessen appointment burden.  We are fine with PCP monitoring renal functions, they will send referral to our office, if needed.  Lab Results  Component Value Date   CREATININE 2.01 (H) 04/29/2021   CREATININE 2.14 (H) 04/28/2021   CREATININE 2.23 (H) 04/27/2021    Intake/Output Summary (Last 24 hours) at 04/29/2021 1208 Last data filed at 04/29/2021 0935 Gross per 24 hour  Intake --  Output 1950 ml  Net -1950 ml    2. Hypertension.  Currently prescribed amlodipine 5 mg daily.  Blood pressure stable   3.  Sepsis secondary to UTI.  UA suggestive of UTI, currently awaiting urine culture.  IV antibiotics course completed.  Blood culture positive for E. coli from suspected urinary tract infection.  Currently receiving oral  Keflex  4. Anemia of chronic disease Hemoglobin remains within desired target  5.Hyponatremia Sodium within target of 136  6. Right Elbow/olecranon fracture with ORIF on 04/27/21. Ortho following. Right arm sling in place, NWB.  Rehab at discharge.   LOS: 7    3/8/202312:08 PM

## 2021-06-03 ENCOUNTER — Other Ambulatory Visit: Payer: Self-pay

## 2021-06-03 ENCOUNTER — Emergency Department: Payer: Medicare Other

## 2021-06-03 ENCOUNTER — Emergency Department
Admission: EM | Admit: 2021-06-03 | Discharge: 2021-06-03 | Disposition: A | Discharge status: Home / Self Care | Payer: Medicare Other | Attending: Emergency Medicine | Admitting: Emergency Medicine

## 2021-06-03 ENCOUNTER — Encounter: Payer: Self-pay | Admitting: Emergency Medicine

## 2021-06-03 DIAGNOSIS — I10 Essential (primary) hypertension: Secondary | ICD-10-CM | POA: Diagnosis not present

## 2021-06-03 DIAGNOSIS — W19XXXA Unspecified fall, initial encounter: Secondary | ICD-10-CM | POA: Insufficient documentation

## 2021-06-03 DIAGNOSIS — M25522 Pain in left elbow: Secondary | ICD-10-CM | POA: Diagnosis present

## 2021-06-03 LAB — URINALYSIS, ROUTINE W REFLEX MICROSCOPIC
Bacteria, UA: NONE SEEN
Bilirubin Urine: NEGATIVE
Glucose, UA: NEGATIVE mg/dL
Ketones, ur: NEGATIVE mg/dL
Nitrite: NEGATIVE
Protein, ur: 30 mg/dL — AB
Specific Gravity, Urine: 1.012 (ref 1.005–1.030)
WBC, UA: >50 WBC/hpf — ABNORMAL HIGH (ref 0–5)
pH: 7 (ref 5.0–8.0)

## 2021-06-03 MED ORDER — OXYCODONE-ACETAMINOPHEN 5-325 MG PO TABS
1.0000 | ORAL_TABLET | Freq: Once | ORAL | Status: AC
  Administered 2021-06-03: 1 via ORAL
  Filled 2021-06-03: qty 1

## 2021-06-03 NOTE — ED Notes (Signed)
See triage note  presents with pain to left forearm  states pain is from elbow into arm  states he had a fall several weeks ago ?

## 2021-06-03 NOTE — ED Triage Notes (Addendum)
First Nurse Note: ?Pt via EMS from home. Pt had a fall 6 weeks ago, and had broken L shoulder. Pt has been to rehab. States that this AM she was unable to move it and more swelling. Pt is A&Ox4 and NAD.  ? ?97% on RA ?112 HR  ?127/56  ?

## 2021-06-03 NOTE — Discharge Instructions (Signed)
Your exam and multiple x-rays of the left arm are negative for any acute fractures or dislocations.  You have some underlying significant rotator cuff arthritis on the left shoulder.  You should follow with your primary provider or orthopedic specialist for ongoing symptom management.  Continue to take your home pain medicine, as prescribed. ?

## 2021-06-03 NOTE — ED Provider Notes (Signed)
? ? ?[facility] ?Emergency Department Provider Note ? ? ? ? Event Date/Time  ? First MD Initiated Contact with Patient 06/03/21 1524   ?  (approximate) ? ? ?History  ? ?Extremity Weakness ? ? ?HPI ? ?Cleora Karnik is a 71 y.o. female with a history of hypertension, PAD, RA, AKI, presents to the ED with sudden onset of left upper extremity pain and disability.  Patient reports disability to the right arm due to attempted range of motion maneuvers, upon awakening this morning.  She denies any recent injury, trauma, or fall.  Patient denies any history of chronic ongoing pain to the left shoulder.  Of note, the patient had a mechanical fall 6 weeks earlier, which resulted in a left humeral fracture.  She apparently has been in rehab at a skilled nursing facility since that time.  Patient denies any head injury, chest pain, shortness of breath, distal paresthesias, upper extremity edema, or skin/temperature changes. ? ? ?Physical Exam  ? ?Triage Vital Signs: ?ED Triage Vitals  ?Enc Vitals Group  ?   BP 06/03/21 1434 139/74  ?   Pulse Rate 06/03/21 1434 (!) 115  ?   Resp 06/03/21 1434 18  ?   Temp 06/03/21 1434 98.6 ?F (37 ?C)  ?   Temp Source 06/03/21 1434 Oral  ?   SpO2 06/03/21 1434 96 %  ?   Weight 06/03/21 1435 100 lb (45.4 kg)  ?   Height 06/03/21 1523 5\' 1"  (1.549 m)  ?   Head Circumference --   ?   Peak Flow --   ?   Pain Score 06/03/21 1435 7  ?   Pain Loc --   ?   Pain Edu? --   ?   Excl. in [place]? --   ? ? ?Most recent vital signs: ?Vitals:  ? 06/03/21 1744 06/03/21 1830  ?BP: 131/66 121/72  ?Pulse: (!) 105 87  ?Resp: 18 19  ?Temp:    ?SpO2:  95%  ? ? ?General Awake, no distress.  ?CV:  Good peripheral perfusion.  ?RESP:  Normal effort.  ?ABD:  No distention.  ?MSK:  Patient with a left extremity held in a flexed abducted position at her side.  No obvious deformity, dislocation, effusion or edema appreciated.  Patient exam is limited secondary to her guarding and resistance to passive  range of motion.  Normal composite fist distally.  Patient with normal supination and pronation of the forearm.  Mild elbow effusion is appreciated.  Normal forearm flexion extension range noted on exam.  Able to demonstrate normal internal/external rotation of the shoulder. ? ? ?ED Results / Procedures / Treatments  ? ?Labs ?(all labs ordered are listed, but only abnormal results are displayed) ?Labs Reviewed  ?URINALYSIS, ROUTINE W REFLEX MICROSCOPIC - Abnormal; Notable for the following components:  ?    Result Value  ? Color, Urine YELLOW (*)   ? APPearance HAZY (*)   ? Hgb urine dipstick SMALL (*)   ? Protein, ur 30 (*)   ? Leukocytes,Ua LARGE (*)   ? WBC, UA >50 (*)   ? All other components within normal limits  ? ? ? ?EKG ? ? ?RADIOLOGY ? ?I personally viewed and evaluated these images as part of my medical decision making, as well as reviewing the written report by the radiologist. ? ?ED Provider Interpretation: no acute findings} ? ?DG Elbow Complete Left ? ?Result Date: 06/03/2021 ?CLINICAL DATA:  Fall.  Pain EXAM: LEFT ELBOW -  COMPLETE 3+ VIEW COMPARISON:  None. FINDINGS: Normal alignment.  No acute fracture.  There is a joint effusion. Cloudlike calcification in soft tissues adjacent to the lateral epicondyle. This may be due to calcific tendinosis. IMPRESSION: Negative for fracture.  Positive for joint effusion. Electronically Signed   By: Franchot Gallo M.D.   On: 06/03/2021 17:27  ? ?DG Shoulder Left ? ?Result Date: 06/03/2021 ?CLINICAL DATA:  Left shoulder and arm pain. EXAM: LEFT SHOULDER - 2+ VIEW COMPARISON:  None. FINDINGS: No evidence for an acute fracture. No evidence for shoulder dislocation or separation. Loss of acromial humeral space is compatible with chronic rotator cuff pathology. Degenerative changes at the rotator cuff insertion noted. IMPRESSION: 1. No acute bony abnormality. 2. Loss of acromial humeral space compatible with chronic rotator cuff pathology. Electronically Signed   By:  Misty Stanley M.D.   On: 06/03/2021 15:00  ? ?DG Humerus Left ? ?Result Date: 06/03/2021 ?CLINICAL DATA:  pain EXAM: LEFT HUMERUS - 2+ VIEW COMPARISON:  X-ray left shoulder 06/03/2021 FINDINGS: There is no evidence of fracture or other focal bone lesions. Query left elbow effusion. Soft tissues are unremarkable. Please see separately dictated left shoulder radiograph 06/03/2021. IMPRESSION: 1. No acute displaced fracture or dislocation of the humerus. 2. Query left elbow effusion. Recommended dedicated radiograph of the left elbow for further evaluation. 3. Please see separately dictated left shoulder radiograph 06/03/2021. Electronically Signed   By: Iven Finn M.D.   On: 06/03/2021 15:03   ? ? ?PROCEDURES: ? ?Critical Care performed: No ? ?Procedures ? ? ?MEDICATIONS ORDERED IN ED: ?Medications  ?oxyCODONE-acetaminophen (PERCOCET/ROXICET) 5-325 MG per tablet 1 tablet (1 tablet Oral Given 06/03/21 1827)  ? ? ? ?IMPRESSION / MDM / ASSESSMENT AND PLAN / ED COURSE  ?I reviewed the triage vital signs and the nursing notes. ?             ?               ? ?Differential diagnosis includes, but is not limited to, shoulder dislocation, shoulder fracture, adhesive capsulitis, rotator cuff tendinopathy, elbow fracture, OA ? ?Geriatric patient to the ED for evaluation of acute left upper extremity pain without preceding injury, trauma, or fall.  Patient with a reassuring exam and x-rays of the upper arm, elbow, and shoulder are negative for any acute findings.  This is based on my review of images and confirmed radiologist report.  Patient's diagnosis is consistent with LUE musculoskeletal pain. Patient will be discharged home with instructions to take her home meds. Patient is to follow up with her PCP or Ortho provider as needed or otherwise directed. Patient is given ED precautions to return to the ED for any worsening or new symptoms. ? ?FINAL CLINICAL IMPRESSION(S) / ED DIAGNOSES  ? ?Final diagnoses:  ?Left elbow pain   ? ? ? ?Rx / DC Orders  ? ?ED Discharge Orders   ? ? None  ? ?  ? ? ? ?Note:  This document was prepared using Dragon voice recognition software and may include unintentional dictation errors. ? ?  ?Melvenia Needles, PA-C ?06/03/21 1856 ? ?  ?Vanessa [place], MD ?06/03/21 1911 ? ?

## 2021-06-03 NOTE — ED Provider Triage Note (Signed)
Emergency Medicine Provider Triage Evaluation Note ? ?Mariana Single , a 71 y.o. female  was evaluated in triage.  Pt complains of left shoulder pain.  Patient denies injury.  Patient has a history of rheumatoid arthritis. ? ?Review of Systems  ?Positive: Left shoulder pain with increased pain with movement. ?Negative: Denies chest pain, shortness of breath, nausea, vomiting. ? ?Physical Exam  ?BP 139/74 (BP Location: Left Arm)   Pulse (!) 115   Temp 98.6 ?F (37 ?C) (Oral)   Resp 18   Wt 45.4 kg   SpO2 96%   BMI 18.89 kg/m?  ?Gen:   Awake, no distress, splinting arm against body. ?Resp:  Normal effort lungs are clear. ?MSK:   Moves lower extremities without difficulty.  Movement of the left shoulder increases pain.  No obvious deformity.  Skin is intact, no skin discoloration.  Radial pulse present distally. ?  ? ?Medical Decision Making  ?Medically screening exam initiated at 3:03 PM.  Appropriate orders placed.  Tommy Minichiello was informed that the remainder of the evaluation will be completed by another provider, this initial triage assessment does not replace that evaluation, and the importance of remaining in the ED until their evaluation is complete. ? ? ?  ?Johnn Hai, PA-C ?06/03/21 1507 ? ?

## 2021-06-03 NOTE — ED Triage Notes (Signed)
Pt in by EMS states woke up this am with left shoulder and arm pain. States unable to move arm due to pain. Pt denies any injury. Denies any other weakness or deficit.  ?

## 2021-06-13 ENCOUNTER — Emergency Department: Payer: Medicare Other

## 2021-06-13 ENCOUNTER — Inpatient Hospital Stay
Admission: EM | Admit: 2021-06-13 | Discharge: 2021-06-15 | DRG: 872 | Disposition: A | Discharge status: Home Health | Payer: Medicare Other | Attending: Internal Medicine | Admitting: Internal Medicine

## 2021-06-13 ENCOUNTER — Other Ambulatory Visit: Payer: Self-pay

## 2021-06-13 DIAGNOSIS — N3000 Acute cystitis without hematuria: Secondary | ICD-10-CM | POA: Diagnosis present

## 2021-06-13 DIAGNOSIS — Z79899 Other long term (current) drug therapy: Secondary | ICD-10-CM | POA: Diagnosis not present

## 2021-06-13 DIAGNOSIS — Z79631 Long term (current) use of antimetabolite agent: Secondary | ICD-10-CM

## 2021-06-13 DIAGNOSIS — Z681 Body mass index (BMI) 19 or less, adult: Secondary | ICD-10-CM | POA: Diagnosis not present

## 2021-06-13 DIAGNOSIS — M069 Rheumatoid arthritis, unspecified: Secondary | ICD-10-CM | POA: Diagnosis present

## 2021-06-13 DIAGNOSIS — N390 Urinary tract infection, site not specified: Principal | ICD-10-CM | POA: Diagnosis present

## 2021-06-13 DIAGNOSIS — I10 Essential (primary) hypertension: Secondary | ICD-10-CM | POA: Diagnosis not present

## 2021-06-13 DIAGNOSIS — E440 Moderate protein-calorie malnutrition: Secondary | ICD-10-CM | POA: Diagnosis present

## 2021-06-13 DIAGNOSIS — Z720 Tobacco use: Secondary | ICD-10-CM | POA: Diagnosis present

## 2021-06-13 DIAGNOSIS — N184 Chronic kidney disease, stage 4 (severe): Secondary | ICD-10-CM | POA: Diagnosis present

## 2021-06-13 DIAGNOSIS — Z8249 Family history of ischemic heart disease and other diseases of the circulatory system: Secondary | ICD-10-CM | POA: Diagnosis not present

## 2021-06-13 DIAGNOSIS — Z7952 Long term (current) use of systemic steroids: Secondary | ICD-10-CM | POA: Diagnosis not present

## 2021-06-13 DIAGNOSIS — F4321 Adjustment disorder with depressed mood: Secondary | ICD-10-CM | POA: Diagnosis present

## 2021-06-13 DIAGNOSIS — Z881 Allergy status to other antibiotic agents status: Secondary | ICD-10-CM

## 2021-06-13 DIAGNOSIS — Z888 Allergy status to other drugs, medicaments and biological substances status: Secondary | ICD-10-CM

## 2021-06-13 DIAGNOSIS — F17210 Nicotine dependence, cigarettes, uncomplicated: Secondary | ICD-10-CM | POA: Diagnosis present

## 2021-06-13 DIAGNOSIS — S32592A Other specified fracture of left pubis, initial encounter for closed fracture: Secondary | ICD-10-CM | POA: Diagnosis present

## 2021-06-13 DIAGNOSIS — Z791 Long term (current) use of non-steroidal anti-inflammatories (NSAID): Secondary | ICD-10-CM

## 2021-06-13 DIAGNOSIS — I739 Peripheral vascular disease, unspecified: Secondary | ICD-10-CM | POA: Diagnosis present

## 2021-06-13 DIAGNOSIS — A419 Sepsis, unspecified organism: Secondary | ICD-10-CM | POA: Diagnosis present

## 2021-06-13 DIAGNOSIS — N10 Acute tubulo-interstitial nephritis: Secondary | ICD-10-CM | POA: Diagnosis present

## 2021-06-13 DIAGNOSIS — Z634 Disappearance and death of family member: Secondary | ICD-10-CM | POA: Diagnosis not present

## 2021-06-13 DIAGNOSIS — Z880 Allergy status to penicillin: Secondary | ICD-10-CM | POA: Diagnosis not present

## 2021-06-13 DIAGNOSIS — Z66 Do not resuscitate: Secondary | ICD-10-CM | POA: Diagnosis present

## 2021-06-13 DIAGNOSIS — S32599A Other specified fracture of unspecified pubis, initial encounter for closed fracture: Secondary | ICD-10-CM | POA: Diagnosis present

## 2021-06-13 DIAGNOSIS — X58XXXA Exposure to other specified factors, initial encounter: Secondary | ICD-10-CM | POA: Diagnosis present

## 2021-06-13 DIAGNOSIS — E871 Hypo-osmolality and hyponatremia: Secondary | ICD-10-CM | POA: Diagnosis present

## 2021-06-13 DIAGNOSIS — M545 Low back pain, unspecified: Secondary | ICD-10-CM | POA: Diagnosis present

## 2021-06-13 DIAGNOSIS — I129 Hypertensive chronic kidney disease with stage 1 through stage 4 chronic kidney disease, or unspecified chronic kidney disease: Secondary | ICD-10-CM | POA: Diagnosis present

## 2021-06-13 DIAGNOSIS — M5136 Other intervertebral disc degeneration, lumbar region: Secondary | ICD-10-CM | POA: Diagnosis present

## 2021-06-13 DIAGNOSIS — K219 Gastro-esophageal reflux disease without esophagitis: Secondary | ICD-10-CM | POA: Diagnosis present

## 2021-06-13 HISTORY — DX: Essential (primary) hypertension: I10

## 2021-06-13 HISTORY — DX: Rheumatoid arthritis, unspecified: M06.9

## 2021-06-13 HISTORY — DX: Tobacco use: Z72.0

## 2021-06-13 HISTORY — DX: Chronic kidney disease, stage 4 (severe): N18.4

## 2021-06-13 LAB — COMPREHENSIVE METABOLIC PANEL
ALT: 21 U/L (ref 0–44)
AST: 25 U/L (ref 15–41)
Albumin: 2.6 g/dL — ABNORMAL LOW (ref 3.5–5.0)
Alkaline Phosphatase: 42 U/L (ref 38–126)
Anion gap: 7 (ref 5–15)
BUN: 29 mg/dL — ABNORMAL HIGH (ref 8–23)
CO2: 27 mmol/L (ref 22–32)
Calcium: 8.2 mg/dL — ABNORMAL LOW (ref 8.9–10.3)
Chloride: 98 mmol/L (ref 98–111)
Creatinine, Ser: 1.23 mg/dL — ABNORMAL HIGH (ref 0.44–1.00)
GFR, Estimated: 46 mL/min — ABNORMAL LOW (ref >60)
Glucose, Bld: 173 mg/dL — ABNORMAL HIGH (ref 70–99)
Potassium: 3.9 mmol/L (ref 3.5–5.1)
Sodium: 132 mmol/L — ABNORMAL LOW (ref 135–145)
Total Bilirubin: 0.6 mg/dL (ref 0.3–1.2)
Total Protein: 6.5 g/dL (ref 6.5–8.1)

## 2021-06-13 LAB — CBC WITH DIFFERENTIAL/PLATELET
Abs Immature Granulocytes: 0.08 10*3/uL — ABNORMAL HIGH (ref 0.00–0.07)
Basophils Absolute: 0.1 10*3/uL (ref 0.0–0.1)
Basophils Relative: 0 %
Eosinophils Absolute: 0.1 10*3/uL (ref 0.0–0.5)
Eosinophils Relative: 0 %
HCT: 33.6 % — ABNORMAL LOW (ref 36.0–46.0)
Hemoglobin: 10.8 g/dL — ABNORMAL LOW (ref 12.0–15.0)
Immature Granulocytes: 1 %
Lymphocytes Relative: 6 %
Lymphs Abs: 0.8 10*3/uL (ref 0.7–4.0)
MCH: 31.8 pg (ref 26.0–34.0)
MCHC: 32.1 g/dL (ref 30.0–36.0)
MCV: 98.8 fL (ref 80.0–100.0)
Monocytes Absolute: 0.7 10*3/uL (ref 0.1–1.0)
Monocytes Relative: 5 %
Neutro Abs: 12.5 10*3/uL — ABNORMAL HIGH (ref 1.7–7.7)
Neutrophils Relative %: 88 %
Platelets: 409 10*3/uL — ABNORMAL HIGH (ref 150–400)
RBC: 3.4 MIL/uL — ABNORMAL LOW (ref 3.87–5.11)
RDW: 13.9 % (ref 11.5–15.5)
WBC: 14.2 10*3/uL — ABNORMAL HIGH (ref 4.0–10.5)
nRBC: 0 % (ref 0.0–0.2)

## 2021-06-13 LAB — URINALYSIS, ROUTINE W REFLEX MICROSCOPIC
Bilirubin Urine: NEGATIVE
Glucose, UA: NEGATIVE mg/dL
Hgb urine dipstick: NEGATIVE
Ketones, ur: NEGATIVE mg/dL
Nitrite: NEGATIVE
Protein, ur: 30 mg/dL — AB
Specific Gravity, Urine: 1.009 (ref 1.005–1.030)
WBC, UA: >50 WBC/hpf — ABNORMAL HIGH (ref 0–5)
pH: 7 (ref 5.0–8.0)

## 2021-06-13 LAB — LACTIC ACID, PLASMA: Lactic Acid, Venous: 1.1 mmol/L (ref 0.5–1.9)

## 2021-06-13 LAB — PROCALCITONIN: Procalcitonin: 0.33 ng/mL

## 2021-06-13 MED ORDER — ACETAMINOPHEN 325 MG PO TABS: 650.0000 mg | ORAL_TABLET | Freq: Four times a day (QID) | ORAL | Status: DC | PRN

## 2021-06-13 MED ORDER — ADULT MULTIVITAMIN W/MINERALS CH
1.0000 | ORAL_TABLET | Freq: Every day | ORAL | Status: DC
  Administered 2021-06-13 – 2021-06-15 (×3): 1 via ORAL
  Filled 2021-06-13 (×3): qty 1

## 2021-06-13 MED ORDER — HYDROCORTISONE SOD SUC (PF) 100 MG IJ SOLR: 50.0000 mg | Freq: Once | INTRAMUSCULAR | Status: DC

## 2021-06-13 MED ORDER — SODIUM CHLORIDE 0.9 % IV BOLUS
1000.0000 mL | Freq: Once | INTRAVENOUS | Status: AC
Start: 2021-06-13 — End: 2021-06-13
  Administered 2021-06-13: 1000 mL via INTRAVENOUS

## 2021-06-13 MED ORDER — ONDANSETRON HCL 4 MG/2ML IJ SOLN: 4.0000 mg | Freq: Three times a day (TID) | INTRAMUSCULAR | Status: DC | PRN

## 2021-06-13 MED ORDER — TRAZODONE HCL 50 MG PO TABS: 50.0000 mg | ORAL_TABLET | Freq: Every evening | ORAL | Status: DC | PRN

## 2021-06-13 MED ORDER — OMEGA-3-ACID ETHYL ESTERS 1 G PO CAPS
1.0000 g | ORAL_CAPSULE | Freq: Every day | ORAL | Status: DC
  Administered 2021-06-13 – 2021-06-15 (×3): 1 g via ORAL
  Filled 2021-06-13 (×3): qty 1

## 2021-06-13 MED ORDER — PANTOPRAZOLE SODIUM 40 MG PO TBEC
40.0000 mg | DELAYED_RELEASE_TABLET | Freq: Every day | ORAL | Status: DC
  Administered 2021-06-14 – 2021-06-15 (×2): 40 mg via ORAL
  Filled 2021-06-13 (×2): qty 1

## 2021-06-13 MED ORDER — ENSURE ENLIVE PO LIQD
237.0000 mL | Freq: Two times a day (BID) | ORAL | Status: DC
  Administered 2021-06-14 – 2021-06-15 (×3): 237 mL via ORAL

## 2021-06-13 MED ORDER — ZOLPIDEM TARTRATE 5 MG PO TABS
5.0000 mg | ORAL_TABLET | Freq: Every evening | ORAL | Status: DC | PRN
  Administered 2021-06-13 – 2021-06-14 (×2): 5 mg via ORAL
  Filled 2021-06-13 (×2): qty 1

## 2021-06-13 MED ORDER — HEPARIN SODIUM (PORCINE) 5000 UNIT/ML IJ SOLN
5000.0000 [IU] | Freq: Three times a day (TID) | INTRAMUSCULAR | Status: DC
  Administered 2021-06-13 – 2021-06-15 (×5): 5000 [IU] via SUBCUTANEOUS
  Filled 2021-06-13 (×5): qty 1

## 2021-06-13 MED ORDER — SODIUM CHLORIDE 0.9 % IV BOLUS
1000.0000 mL | Freq: Once | INTRAVENOUS | Status: AC
  Administered 2021-06-13: 1000 mL via INTRAVENOUS

## 2021-06-13 MED ORDER — ALBUTEROL SULFATE (2.5 MG/3ML) 0.083% IN NEBU: 3.0000 mL | INHALATION_SOLUTION | Freq: Four times a day (QID) | RESPIRATORY_TRACT | Status: DC | PRN

## 2021-06-13 MED ORDER — GLUCOSAMINE-CHONDROITIN 500-400 MG PO TABS: 1.0000 | ORAL_TABLET | Freq: Two times a day (BID) | ORAL | Status: DC

## 2021-06-13 MED ORDER — METHYLPREDNISOLONE SODIUM SUCC 40 MG IJ SOLR
40.0000 mg | Freq: Once | INTRAMUSCULAR | Status: AC
Start: 2021-06-13 — End: 2021-06-13
  Administered 2021-06-13: 40 mg via INTRAVENOUS
  Filled 2021-06-13: qty 1

## 2021-06-13 MED ORDER — OMEPRAZOLE MAGNESIUM 20 MG PO TBEC
20.0000 mg | DELAYED_RELEASE_TABLET | Freq: Every day | ORAL | Status: DC
Start: 2021-06-13 — End: 2021-06-13

## 2021-06-13 MED ORDER — PREDNISONE 10 MG PO TABS
5.0000 mg | ORAL_TABLET | Freq: Every day | ORAL | Status: DC
  Administered 2021-06-14 – 2021-06-15 (×2): 5 mg via ORAL
  Filled 2021-06-13 (×2): qty 1

## 2021-06-13 MED ORDER — LEVOFLOXACIN IN D5W 750 MG/150ML IV SOLN
750.0000 mg | Freq: Once | INTRAVENOUS | Status: AC
  Administered 2021-06-13: 750 mg via INTRAVENOUS
  Filled 2021-06-13: qty 150

## 2021-06-13 MED ORDER — HYDRALAZINE HCL 20 MG/ML IJ SOLN: 5.0000 mg | INTRAMUSCULAR | Status: DC | PRN

## 2021-06-13 MED ORDER — NICOTINE 21 MG/24HR TD PT24
21.0000 mg | MEDICATED_PATCH | Freq: Every day | TRANSDERMAL | Status: DC
  Administered 2021-06-13 – 2021-06-15 (×3): 21 mg via TRANSDERMAL
  Filled 2021-06-13 (×3): qty 1

## 2021-06-13 MED ORDER — HYDROCORTISONE SOD SUC (PF) 100 MG IJ SOLR
50.0000 mg | Freq: Once | INTRAMUSCULAR | Status: DC
  Filled 2021-06-13: qty 1

## 2021-06-13 MED ORDER — OXYCODONE-ACETAMINOPHEN 5-325 MG PO TABS
1.0000 | ORAL_TABLET | ORAL | Status: DC | PRN
Start: 2021-06-13 — End: 2021-06-15
  Administered 2021-06-13 – 2021-06-14 (×2): 1 via ORAL
  Filled 2021-06-13 (×2): qty 1

## 2021-06-13 MED ORDER — FOLIC ACID 1 MG PO TABS
1.0000 mg | ORAL_TABLET | Freq: Every day | ORAL | Status: DC
  Administered 2021-06-13 – 2021-06-15 (×3): 1 mg via ORAL
  Filled 2021-06-13 (×3): qty 1

## 2021-06-13 MED ORDER — ACETAMINOPHEN 500 MG PO TABS
1000.0000 mg | ORAL_TABLET | Freq: Once | ORAL | Status: AC
  Administered 2021-06-13: 1000 mg via ORAL
  Filled 2021-06-13: qty 2

## 2021-06-13 MED ORDER — LEVOFLOXACIN IN D5W 750 MG/150ML IV SOLN: 750.0000 mg | INTRAVENOUS | Status: DC

## 2021-06-13 NOTE — H&P (Signed)
?History and Physical  ? ? ?Lynn Norton HYQ:[id number] DOB: Jun 06, 1950 DOA: 06/13/2021 ? ?Referring MD/NP/PA:  ? ?PCP: [name], MD  ? ?Patient coming from:  The patient is coming from home.  At baseline, pt is independent for most of ADL.       ? ?Chief Complaint: Dysuria  ? ?HPI: Lynn Norton is a 71 y.o. female with medical history significant of hypertension, GERD, rheumatoid arthritis, tobacco abuse, PAD, CKD-4, UTI, E. coli bacteremia, who presents with dysuria. ? ?Patient states that she has dysuria for more than 3 days, also reports increased urinary frequency, burning on urination.  No hematuria.  No fever or chills.  Denies nausea vomiting, diarrhea or abdominal pain.  No chest pain, cough, shortness breath.  She also reports lower back pain.  No injury. ? ?Data Reviewed and ED Course: pt was found to have WBC 14.2, positive urinalysis (cloudy appearance, large amount of leukocyte, many bacteria, WBC> 50), lactic acid 1.1, stable renal function, temperature normal, blood pressure 106/59, heart rate 115, RR 24, oxygen saturation 96% on room air.  CT-renal stone negative for kidney stone, but showed left pubic rami fracture and lumbar spine degenerative disc disease.  Patient is admitted to [facility] bed as inpatient ? ?EKG: I have personally reviewed.  Sinus rhythm, QTc 481, LAD, low voltage, early R wave progression, T wave inversion in V1-V3. ? ? ?Review of Systems:  ? ?General: no fevers, chills, no body weight gain, has fatigue ?HEENT: no blurry vision, hearing changes or sore throat ?Respiratory: no dyspnea, coughing, wheezing ?CV: no chest pain, no palpitations ?GI: no nausea, vomiting, abdominal pain, diarrhea, constipation ?GU: has dysuria, burning on urination, increased urinary frequency, no hematuria  ?Ext: no leg edema ?Neuro: no unilateral weakness, numbness, or tingling, no vision change or hearing loss ?Skin: no rash, no skin tear. ?MSK: No muscle spasm, no deformity, no limitation  of range of movement in spin. Has lower back pain ?Heme: No easy bruising.  ?Travel history: No recent long distant travel. ? ? ?Allergy:  ?Allergies  ?Allergen Reactions  ? Sulfa Antibiotics Other (See Comments) and Itching  ? Ceftriaxone Rash  ? Penicillins Rash and Other (See Comments)  ? Rocephin [Ceftriaxone] Hives  ?  Spoke with patient. States she had hives that spread in response to rocephin  ? Sulfa Antibiotics   ? Penicillins Itching  ? ? ?Past Medical History:  ?Diagnosis Date  ? Arthritis   ? CKD (chronic kidney disease), stage IV ([place])   ? HTN (hypertension)   ? RA (rheumatoid arthritis) ([place])   ? Tobacco abuse   ? ? ?Past Surgical History:  ?Procedure Laterality Date  ? ORIF ELBOW FRACTURE Right 04/27/2021  ? Procedure: OPEN REDUCTION INTERNAL FIXATION (ORIF) ELBOW/OLECRANON FRACTURE;  Surgeon: Hessie Knows, MD;  Location: [facility] ORS;  Service: Orthopedics;  Laterality: Right;  ? ? ?Social History:  reports that she has been smoking cigarettes. She has a 50.00 pack-year smoking history. She has never used smokeless tobacco. She reports that she does not currently use alcohol. She reports that she does not use drugs. ? ?Family History:  ?Family History  ?Problem Relation Age of Onset  ? Heart attack Brother   ?  ? ?Prior to Admission medications   ?Medication Sig Start Date End Date Taking? Authorizing Provider  ?albuterol (VENTOLIN HFA) 108 (90 Base) MCG/ACT inhaler Inhale 1-2 puffs into the lungs 4 (four) times daily as needed for wheezing or shortness of breath.    [provider]  ?amLODipine (NORVASC) 2.5 MG tablet Take 2.5 mg by mouth daily. (Take with 5mg  tablet to equal 7.5mg  total)    [provider]  ?amLODipine (NORVASC) 5 MG tablet Take 5 mg by mouth daily. 08/19/17   [provider]  ?amLODipine (NORVASC) 5 MG tablet Take 5 mg by mouth daily. (Take with 2.5mg  tablet to equal 7.5mg  total)    [provider]  ?Colchicine (MITIGARE) 0.6 MG CAPS Take by mouth  daily.    [provider]  ?cyclobenzaprine (FLEXERIL) 10 MG tablet Take 10 mg by mouth at bedtime.    [provider]  ?doxycycline (VIBRAMYCIN) 100 MG capsule Take 100 mg by mouth daily.    [provider]  ?feeding supplement (ENSURE ENLIVE / ENSURE PLUS) LIQD Take 237 mLs by mouth 2 (two) times daily between meals. 04/29/21   Fritzi Mandes, MD  ?folic acid (FOLVITE) 1 MG tablet Take 1 mg by mouth daily. 05/03/13   [provider]  ?folic acid (FOLVITE) 1 MG tablet Take 1 mg by mouth daily.    [provider]  ?glucosamine-chondroitin 500-400 MG tablet Take 1 tablet by mouth 2 (two) times daily.    [provider]  ?meloxicam (MOBIC) 15 MG tablet Take 15 mg by mouth daily. 07/29/14   [provider]  ?meloxicam (MOBIC) 15 MG tablet Take 15 mg by mouth daily.    [provider]  ?methotrexate (RHEUMATREX) 2.5 MG tablet Take by mouth once a week. 05/31/14   [provider]  ?methotrexate (RHEUMATREX) 2.5 MG tablet Take 15 mg by mouth every Wednesday.    [provider]  ?Multiple Vitamins-Minerals (MULTIVITAMIN WITH MINERALS) tablet Take 1 tablet by mouth daily.    [provider]  ?nicotine (NICODERM CQ - DOSED IN MG/24 HOURS) 14 mg/24hr patch Place 1 patch (14 mg total) onto the skin daily. 04/29/21   Fritzi Mandes, MD  ?Omega-3 Fatty Acids (FISH OIL) 1200 MG CAPS Take 1 capsule by mouth daily.    [provider]  ?omeprazole (PRILOSEC OTC) 20 MG tablet Take 20 mg by mouth daily.    [provider]  ?oxyCODONE (OXY IR/ROXICODONE) 5 MG immediate release tablet Take 1 tablet (5 mg total) by mouth every 6 (six) hours as needed for moderate pain. 04/29/21   Fritzi Mandes, MD  ?polyethylene glycol (MIRALAX / GLYCOLAX) 17 g packet Take 17 g by mouth daily as needed for mild constipation. 04/29/21   Fritzi Mandes, MD  ?predniSONE (DELTASONE) 2.5 MG tablet Take 2.5 mg by mouth daily.    [provider]  ?predniSONE  (DELTASONE) 2.5 MG tablet Take 2.5 mg by mouth daily.    [provider]  ?traZODone (DESYREL) 50 MG tablet TAKE 1 TABLET BY MOUTH  NIGHTLY AS NEEDED FOR SLEEP 09/06/17   [provider]  ?traZODone (DESYREL) 50 MG tablet Take 50 mg by mouth at bedtime.    [provider]  ?zolpidem (AMBIEN) 10 MG tablet Take by mouth as needed. 08/19/17   [provider]  ?zolpidem (AMBIEN) 10 MG tablet Take 10 mg by mouth at bedtime as needed for sleep.    [provider]  ? ? ?Physical Exam: ?Vitals:  ? 06/13/21 1300 06/13/21 1352 06/13/21 1540 06/13/21 1631  ?BP: 121/64 110/60  105/62  ?Pulse: 91 88  81  ?Resp: (!) 35 20  20  ?Temp:    98.4 ?F (36.9 ?C)  ?TempSrc:    Oral  ?SpO2: 95% 97%  95%  ?Weight:   40.4 kg   ?Height:   5\' 1"  (1.549 m)   ? ?General: Not in acute distress ?HEENT: ?      Eyes: PERRL, EOMI, no scleral icterus. ?      ENT: No discharge from the ears and nose, no pharynx injection, no tonsillar enlargement.  ?      Neck: No JVD, no bruit, no mass felt. ?Heme: No neck lymph node enlargement. ?Cardiac: S1/S2, RRR, No murmurs, No gallops or rubs. ?Respiratory: No rales, wheezing, rhonchi or rubs. ?GI: Soft, nondistended, nontender, no rebound pain, no organomegaly, BS present. ?GU: No hematuria ?Ext: No pitting leg edema bilaterally. 1+DP/PT pulse bilaterally. ?Musculoskeletal: No joint deformities, No joint redness or warmth, no limitation of ROM in spin. ?Skin: No rashes.  ?Neuro: Alert, oriented X3, cranial nerves II-XII grossly intact, moves all extremities normally. ?Psych: Patient is not psychotic, no suicidal or hemocidal ideation. ? ?Labs on Admission: I have personally reviewed following labs and imaging studies ? ?CBC: ?Recent Labs  ?Lab 06/13/21 ?4268  ?WBC 14.2*  ?NEUTROABS 12.5*  ?HGB 10.8*  ?HCT 33.6*  ?MCV 98.8  ?PLT 409*  ? ?Basic Metabolic Panel: ?Recent Labs  ?Lab 06/13/21 ?1035  ?NA 132*  ?K 3.9  ?CL 98  ?CO2 27  ?GLUCOSE 173*  ?BUN 29*  ?CREATININE  1.23*  ?CALCIUM 8.2*  ? ?GFR: ?Estimated Creatinine Clearance: 26 mL/min (A) (by C-G formula based on SCr of 1.23 mg/dL (H)). ?Liver Function Tests: ?Recent Labs  ?Lab 06/13/21 ?1035  ?AST 25  ?ALT 21  ?[facility]

## 2021-06-13 NOTE — Assessment & Plan Note (Addendum)
See below

## 2021-06-13 NOTE — Assessment & Plan Note (Signed)
-   Patient is getting weekly methotrexate ?-Continue prednisone, will increase dose of from 2.5 to 5 mg daily ?-Give 40 mg of Solu-Medrol stress dose ?

## 2021-06-13 NOTE — Assessment & Plan Note (Signed)
Sepsis due to UTI: Patient has sepsis with WBC 14.2, tachycardia with heart rate 115, RR 24.  Lactic acid is normal. ?-Admitted to MedSurg bed as inpatient ?-IV Levaquin (patient is allergic to Rocephin) ?-Follow-up of blood culture and urine culture ?-will get Procalcitonin ?-IVF: 2L of NS bolus in ED ? ? ?

## 2021-06-13 NOTE — ED Triage Notes (Signed)
Pt presents to ED with c /o of lower back pain straight across lower back. Pt c/o of dysuria. Pt states dysuria started last night. Pt denies fevers or chills. Pt denies any issues with this prior today. Pt is A&Ox4. VSS.  ?

## 2021-06-13 NOTE — Assessment & Plan Note (Signed)
Renal function at baseline.  Recent baseline creatinine 2.17 on 04/25/2021.  Her creatinine is 1.23, BUN 29 ?-Follow-up by BMP ?

## 2021-06-13 NOTE — ED Notes (Signed)
Lab called to state the green top was hemolyzed, Lab educated on the timeframe on when the blood was sent down to lab, lab reports it has nothing to do with the time it was collected and that "it was a collection issue".  ?

## 2021-06-13 NOTE — ED Notes (Signed)
Lab called to run lab work that was sent down on patient arrival. This RN place protocol labs, there were orders placed when blood specimens were sent down.  ?

## 2021-06-13 NOTE — Assessment & Plan Note (Signed)
-  Nicotine patch 

## 2021-06-13 NOTE — Progress Notes (Signed)
\  Pharmacy Antibiotic Note ? ?Lynn Norton is a 71 y.o. female with past medical history of rheumatoid arthritis on methotrexate, frequent UTIs and history of bacteremia from E. coli secondary to UTI who presented with dysuria and back/flank pain. Pharmacy has been consulted for levofloxacin dosing. ? ?Plan: ?Levofloxacin 750 mg IV Q48H ?Monitor renal function and adjust dose as clinically indicated ? ?Height: 5\' 1"  (154.9 cm) ?Weight: 44.5 kg (98 lb) ?IBW/kg (Calculated) : 47.8 ? ?Temp (24hrs), Avg:98.6 ?F (37 ?C), Min:98.6 ?F (37 ?C), Max:98.6 ?F (37 ?C) ? ?Recent Labs  ?Lab 06/13/21 ?0853 06/13/21 ?1035  ?WBC 14.2*  --   ?CREATININE  --  1.23*  ?LATICACIDVEN  --  1.1  ?  ?Estimated Creatinine Clearance: 28.6 mL/min (A) (by C-G formula based on SCr of 1.23 mg/dL (H)).   ? ?Allergies  ?Allergen Reactions  ? Sulfa Antibiotics Other (See Comments) and Itching  ? Ceftriaxone Rash  ? Penicillins Rash and Other (See Comments)  ? Rocephin [Ceftriaxone] Hives  ?  Spoke with patient. States she had hives that spread in response to rocephin  ? Sulfa Antibiotics   ? Penicillins Itching  ? ? ?Antimicrobials this admission: ?4/22 levofloxacin >>  ? ?Dose adjustments this admission: ? ? ?Microbiology results: ?4/22 BCx: pending ?4/22 UCx: pending  ? ?Thank you for allowing pharmacy to be a part of this patient?s care. ? ?Forde Dandy Jaynie Hitch ?06/13/2021 1:38 PM ? ?

## 2021-06-13 NOTE — Assessment & Plan Note (Signed)
Ensure ?-Nutrition consult ?

## 2021-06-13 NOTE — ED Notes (Signed)
Informed RN bed assigned 

## 2021-06-13 NOTE — ED Provider Notes (Signed)
? ?[facility] ?Provider Note ? ? ? Event Date/Time  ? First MD Initiated Contact with Patient 06/13/21 [phone]   ?  (approximate) ? ? ?History  ? ?Flank Pain ? ? ?HPI ? ?Lynn Norton is a 71 y.o. female with past medical history of rheumatoid arthritis on methotrexate, frequent UTIs and history of bacteremia from E. coli secondary to UTI who presents with dysuria and back pain.  Symptoms have been going on for several days.  She has urinary frequency and dysuria as well as midline low back pain.  Feels more weak as well.  Has not had any injuries.  Had urinary incontinence last night.  Denies any numbness tingling weakness in her legs.  No fevers no nausea vomiting eating and drinking well.  Patient has history of rash with ceftriaxone but has tolerated Keflex. ?  ? ?Past Medical History:  ?Diagnosis Date  ? Arthritis   ? ? ?Patient Active Problem List  ? Diagnosis Date Noted  ? HTN (hypertension) 04/26/2021  ? Rheumatoid arthritis ([place]) 04/24/2021  ? Tobacco use 04/24/2021  ? Bacteremia due to Escherichia coli 04/23/2021  ? Sepsis ([place]) 04/22/2021  ? Closed fracture dislocation of right elbow 04/22/2021  ? AKI (acute kidney injury) ([place]) 04/22/2021  ? Frequent falls 04/22/2021  ? Hypertension 03/16/2018  ? PAD (peripheral artery disease) ([place]) 03/16/2018  ? Tobacco use disorder 08/12/2014  ? Unspecified osteoarthritis, unspecified site 11/03/2011  ? ? ? ?Physical Exam  ?Triage Vital Signs: ?ED Triage Vitals  ?Enc Vitals Group  ?   BP 06/13/21 0852 130/73  ?   Pulse Rate 06/13/21 0852 89  ?   Resp 06/13/21 0852 19  ?   Temp 06/13/21 0852 98.6 ?F (37 ?C)  ?   Temp Source 06/13/21 0852 Oral  ?   SpO2 06/13/21 0852 98 %  ?   Weight 06/13/21 0854 98 lb (44.5 kg)  ?   Height 06/13/21 0854 5\' 1"  (1.549 m)  ?   Head Circumference --   ?   Peak Flow --   ?   Pain Score 06/13/21 0854 7  ?   Pain Loc --   ?   Pain Edu? --   ?   Excl. in [place]? --   ? ? ?Most recent vital signs: ?Vitals:  ? 06/13/21 1130  06/13/21 1200  ?BP: 109/60 113/61  ?Pulse: 76 84  ?Resp: (!) 23 18  ?Temp:    ?SpO2: 94% 96%  ? ? ? ?General: Awake, no distress.  ?CV:  Good peripheral perfusion.  ?Resp:  Normal effort.  ?Abd:  No distention. nontender ?Neuro:             Awake, Alert, Oriented x 3  ?Other:  Ttp in lumabr midline, no CVA ttp ?5/5 strength with hip flexion, plantar and dorsiflexion  ? ? ?ED Results / Procedures / Treatments  ?Labs ?(all labs ordered are listed, but only abnormal results are displayed) ?Labs Reviewed  ?CBC WITH DIFFERENTIAL/PLATELET - Abnormal; Notable for the following components:  ?    Result Value  ? WBC 14.2 (*)   ? RBC 3.40 (*)   ? Hemoglobin 10.8 (*)   ? HCT 33.6 (*)   ? Platelets 409 (*)   ? Neutro Abs 12.5 (*)   ? Abs Immature Granulocytes 0.08 (*)   ? All other components within normal limits  ?URINALYSIS, ROUTINE W REFLEX MICROSCOPIC - Abnormal; Notable for the following components:  ? Color, Urine YELLOW (*)   ?  APPearance CLOUDY (*)   ? Protein, ur 30 (*)   ? Leukocytes,Ua LARGE (*)   ? WBC, UA >50 (*)   ? Bacteria, UA MANY (*)   ? All other components within normal limits  ?COMPREHENSIVE METABOLIC PANEL - Abnormal; Notable for the following components:  ? Sodium 132 (*)   ? Glucose, Bld 173 (*)   ? BUN 29 (*)   ? Creatinine, Ser 1.23 (*)   ? Calcium 8.2 (*)   ? Albumin 2.6 (*)   ? GFR, Estimated 46 (*)   ? All other components within normal limits  ?URINE CULTURE  ?CULTURE, BLOOD (ROUTINE X 2)  ?CULTURE, BLOOD (ROUTINE X 2)  ?LACTIC ACID, PLASMA  ?LACTIC ACID, PLASMA  ? ? ? ?EKG ? ? ? ? ?RADIOLOGY ? ? ? ?PROCEDURES: ? ?Critical Care performed: No ? ?.1-3 Lead EKG Interpretation ?Performed by: Rada Hay, MD ?Authorized by: Rada Hay, MD  ? ?  Interpretation: normal   ?  ECG rate assessment: normal   ?  Ectopy: none   ?  Conduction: normal   ? ?The patient is on the cardiac monitor to evaluate for evidence of arrhythmia and/or significant heart rate changes. ? ? ?MEDICATIONS ORDERED IN  ED: ?Medications  ?levofloxacin (LEVAQUIN) IVPB 750 mg (750 mg Intravenous New Bag/Given 06/13/21 1231)  ?acetaminophen (TYLENOL) tablet 1,000 mg (1,000 mg Oral Given 06/13/21 0946)  ?sodium chloride 0.9 % bolus 1,000 mL (1,000 mLs Intravenous Bolus 06/13/21 1037)  ?sodium chloride 0.9 % bolus 1,000 mL (1,000 mLs Intravenous New Bag/Given 06/13/21 1255)  ? ? ? ?IMPRESSION / MDM / ASSESSMENT AND PLAN / ED COURSE  ?I reviewed the triage vital signs and the nursing notes. ?             ?               ? ?Differential diagnosis includes, but is not limited to, UTI, pyelo, kidney stone, sepsis, compression fracture ? ?Patient is a 71 year old female past medical history of RA on methotrexate who presents with urinary frequency dysuria and back pain.  Back pain is lumbar midline and not flank.  Has not had fevers nausea vomiting.  Has been more weak at home.  She is tachycardic and tachypneic meeting sepsis criteria.  UA is grossly positive with greater than 50 WBCs.  Reviewed last urine culture revealing E. coli.  Patient also has a white count of 14 lactate is normal.  Blood cultures and urine culture pending.  Patient has a history of urticaria after ceftriaxone.  Has tolerated Keflex though.  Also allergies to penicillin.  Discussed with pharmacy whether cefepime would be a good option but they note that ceftriaxone and cefepime have the exact same side change that this would not be the best option.  I am concerned clinically more for [place] given the back pain as opposed to just simple cystitis.  Although would like to avoid fluoroquinolones in this 71 year old I think that is last option.  Will order IV Levaquin.  CT renal study obtained to rule out any obstruction and this is negative.  Patient's age immunocompromise and borderline low blood pressure will admit to the hospitalist service. ? ?  ? ? ?FINAL CLINICAL IMPRESSION(S) / ED DIAGNOSES  ? ?Final diagnoses:  ?Urinary tract infection without hematuria, site  unspecified  ? ? ? ?Rx / DC Orders  ? ?ED Discharge Orders   ? ? None  ? ?  ? ? ? ?Note:  This document was prepared using Dragon voice recognition software and may include unintentional dictation errors. ?  ?Rada Hay, MD ?06/13/21 1316 ? ?

## 2021-06-13 NOTE — Progress Notes (Signed)
?   06/13/21 1700  ?Clinical Encounter Type  ?Visited With Patient  ?Visit Type Initial  ?Referral From Nurse  ?Spiritual Encounters  ?Spiritual Needs Prayer;Grief support  ? ?Patient recently lost her husband and Chaplain provided grief and emotional support ?

## 2021-06-13 NOTE — Assessment & Plan Note (Signed)
-   IV hydralazine as needed ?-Hold amlodipine due to sepsis and risk of developing hypotension ?

## 2021-06-13 NOTE — Assessment & Plan Note (Signed)
Also has lower back pain due to degenerative disc disease ?-As needed Tylenol and Percocet ?

## 2021-06-14 DIAGNOSIS — N3000 Acute cystitis without hematuria: Secondary | ICD-10-CM | POA: Diagnosis not present

## 2021-06-14 LAB — CBC
HCT: 32.6 % — ABNORMAL LOW (ref 36.0–46.0)
Hemoglobin: 10.6 g/dL — ABNORMAL LOW (ref 12.0–15.0)
MCH: 31.9 pg (ref 26.0–34.0)
MCHC: 32.5 g/dL (ref 30.0–36.0)
MCV: 98.2 fL (ref 80.0–100.0)
Platelets: 387 10*3/uL (ref 150–400)
RBC: 3.32 MIL/uL — ABNORMAL LOW (ref 3.87–5.11)
RDW: 13.7 % (ref 11.5–15.5)
WBC: 12.2 10*3/uL — ABNORMAL HIGH (ref 4.0–10.5)
nRBC: 0 % (ref 0.0–0.2)

## 2021-06-14 LAB — BASIC METABOLIC PANEL
Anion gap: 5 (ref 5–15)
BUN: 22 mg/dL (ref 8–23)
CO2: 23 mmol/L (ref 22–32)
Calcium: 8.2 mg/dL — ABNORMAL LOW (ref 8.9–10.3)
Chloride: 103 mmol/L (ref 98–111)
Creatinine, Ser: 1 mg/dL (ref 0.44–1.00)
GFR, Estimated: 59 mL/min — ABNORMAL LOW (ref >60)
Glucose, Bld: 139 mg/dL — ABNORMAL HIGH (ref 70–99)
Potassium: 4.3 mmol/L (ref 3.5–5.1)
Sodium: 131 mmol/L — ABNORMAL LOW (ref 135–145)

## 2021-06-14 MED ORDER — LEVOFLOXACIN 750 MG PO TABS
750.0000 mg | ORAL_TABLET | ORAL | Status: AC
  Administered 2021-06-15: 750 mg via ORAL
  Filled 2021-06-14: qty 1

## 2021-06-14 NOTE — Plan of Care (Signed)

## 2021-06-14 NOTE — Progress Notes (Signed)
?PROGRESS NOTE ? ? ? ?Lynn Norton  LDJ:[id number] DOB: 01-25-1951 DOA: 06/13/2021 ?PCP: [name], MD  ? ? ?Brief Narrative:  ?71 y.o. female with medical history significant of hypertension, GERD, rheumatoid arthritis, tobacco abuse, PAD, CKD-4, UTI, E. coli bacteremia, who presents with dysuria. ?  ?Patient states that she has dysuria for more than 3 days, also reports increased urinary frequency, burning on urination.  No hematuria.  No fever or chills.  Denies nausea vomiting, diarrhea or abdominal pain.  No chest pain, cough, shortness breath.  She also reports lower back pain.  No injury. ? ?Presentation consistent with acute cystitis.  Started on IV antibiotics.  Feeling improved symptomatically ? ? ?Assessment & Plan: ?  ?Principal Problem: ?  UTI (urinary tract infection) ?Active Problems: ?  Rheumatoid arthritis ([place]) ?  Tobacco use ?  Hypertension ?  Closed fracture of pubic ramus-left  ?  Lower back pain ?  Protein-calorie malnutrition, moderate ([place]) ?  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 ?UTI (urinary tract infection) ?Sepsis due to UTI:  ?Patient has sepsis with WBC 14.2, tachycardia with heart rate 115, RR 24.   ?Lactic acid is normal. ?Improving over interval ?Plan: ?Continue IVF ?Continue IV Levaquin, 3-day stop date ?Follow blood and urine cultures ?If patient continues to improve anticipate discharge on 4/24 ?We will request therapy evaluations ?-Admitted to MedSurg bed as inpatient ?-IV Levaquin (patient is allergic to Rocephin) ?-Follow-up of blood culture and urine culture ?-will get Procalcitonin ? ?Rheumatoid arthritis ([place]) ?- Patient is getting weekly methotrexate ?-Continue prednisone, will increase dose of from 2.5 to 5 mg daily ?  ?Tobacco use ?- Nicotine patch ?  ?Hypertension ?- IV hydralazine as needed ?-Hold amlodipine due to sepsis and risk of developing hypotension ?-Blood pressure currently adequately controlled ?  ?Lower back pain ?See below ?   ?Closed fracture of pubic ramus-left  ?Also has lower back pain due to degenerative disc disease ?-As needed Tylenol and Percocet ?  ?Protein-calorie malnutrition, moderate ([place]) ?Ensure ?-Nutrition consult ?  ?CKD (chronic kidney disease) stage 4, GFR 15-29 ml/min (HCC) ?Renal function at baseline.  Recent baseline creatinine 2.17 on 04/25/2021.  Her creatinine is 1.23, BUN 29 ?-Follow-up by BMP ? ?Grief ?Patient endorses recent loss of her husband ?Empathetic listening ?Pastoral consult ? ? ?DVT prophylaxis: SQ heparin ?Code Status: DNR ?Family Communication: None today.  Offered to call but patient declined ?Disposition Plan: Status is: Inpatient ?Remains inpatient appropriate because: UTI with associated sepsis.  Improving over interval.  Anticipate discharge 4/24 ? ? ?Level of care: Med-Surg ? ?Consultants:  ?None ? ?Procedures:  ?None ? ?Antimicrobials: ?Levaquin ? ? ?Subjective: ?Seen and examined.  Sitting in bed.  No visible distress.  No pain complaints.  Endorses symptomatic improvement since admission. ? ?Objective: ?Vitals:  ? 06/13/21 1631 06/13/21 2051 06/14/21 0500 06/14/21 0740  ?BP: 105/62 121/90 124/70 125/70  ?Pulse: 81 84 71 89  ?Resp: 20 20  18   ?Temp: 98.4 ?F (36.9 ?C) 98.8 ?F (37.1 ?C) 98.7 ?F (37.1 ?C) 98.1 ?F (36.7 ?C)  ?TempSrc: Oral Oral Oral   ?SpO2: 95% 98% 96% 99%  ?Weight:      ?Height:      ? ? ?Intake/Output Summary (Last 24 hours) at 06/14/2021 1028 ?Last data filed at 06/14/2021 1000 ?Gross per 24 hour  ?Intake 1509.14 ml  ?Output --  ?Net 1509.14 ml  ? ?Filed Weights  ? 06/13/21 0854 06/13/21 1540  ?Weight: 44.5 kg 40.4 kg  ? ? ?  Examination: ? ?General exam: Appears calm and comfortable  ?Respiratory system: Clear to auscultation. Respiratory effort normal. ?Cardiovascular system: S1-S2, RRR, no murmurs, no pedal edema ?Gastrointestinal system: Soft, NT/ND, normal bowel sounds ?Central nervous system: Alert and oriented. No focal neurological deficits. ?Extremities: Symmetric 5 x 5  power. ?Skin: No rashes, lesions or ulcers ?Psychiatry: Judgement and insight appear normal. Mood & affect appropriate.  ? ? ? ?Data Reviewed: I have personally reviewed following labs and imaging studies ? ?CBC: ?Recent Labs  ?Lab 06/13/21 ?3818 06/14/21 ?2993  ?WBC 14.2* 12.2*  ?NEUTROABS 12.5*  --   ?HGB 10.8* 10.6*  ?HCT 33.6* 32.6*  ?MCV 98.8 98.2  ?PLT 409* 387  ? ?Basic Metabolic Panel: ?Recent Labs  ?Lab 06/13/21 ?1035 06/14/21 ?0443  ?NA 132* 131*  ?K 3.9 4.3  ?CL 98 103  ?CO2 27 23  ?GLUCOSE 173* 139*  ?BUN 29* 22  ?CREATININE 1.23* 1.00  ?CALCIUM 8.2* 8.2*  ? ?GFR: ?Estimated Creatinine Clearance: 32 mL/min (by C-G formula based on SCr of 1 mg/dL). ?Liver Function Tests: ?Recent Labs  ?Lab 06/13/21 ?1035  ?AST 25  ?ALT 21  ?ALKPHOS 42  ?BILITOT 0.6  ?PROT 6.5  ?ALBUMIN 2.6*  ? ?No results for input(s): LIPASE, AMYLASE in the last 168 hours. ?No results for input(s): AMMONIA in the last 168 hours. ?Coagulation Profile: ?No results for input(s): INR, PROTIME in the last 168 hours. ?Cardiac Enzymes: ?No results for input(s): CKTOTAL, CKMB, CKMBINDEX, TROPONINI in the last 168 hours. ?BNP (last 3 results) ?No results for input(s): PROBNP in the last 8760 hours. ?HbA1C: ?No results for input(s): HGBA1C in the last 72 hours. ?CBG: ?No results for input(s): GLUCAP in the last 168 hours. ?Lipid Profile: ?No results for input(s): CHOL, HDL, LDLCALC, TRIG, CHOLHDL, LDLDIRECT in the last 72 hours. ?Thyroid Function Tests: ?No results for input(s): TSH, T4TOTAL, FREET4, T3FREE, THYROIDAB in the last 72 hours. ?Anemia Panel: ?No results for input(s): VITAMINB12, FOLATE, FERRITIN, TIBC, IRON, RETICCTPCT in the last 72 hours. ?Sepsis Labs: ?Recent Labs  ?Lab 06/13/21 ?1035  ?PROCALCITON 0.33  ?LATICACIDVEN 1.1  ? ? ?Recent Results (from the past 240 hour(s))  ?Blood culture (routine x 2)     Status: None (Preliminary result)  ? Collection Time: 06/13/21 10:35 AM  ? Specimen: BLOOD  ?Result Value Ref Range Status  ?  Specimen Description BLOOD RIGHT ANTECUBITAL  Final  ? Special Requests   Final  ?  BOTTLES DRAWN AEROBIC AND ANAEROBIC Blood Culture adequate volume  ? Culture   Final  ?  NO GROWTH < 24 HOURS ?Performed at [facility], [address], [place], [place] [address] ?  ? Report Status PENDING  Incomplete  ?Blood culture (routine x 2)     Status: None (Preliminary result)  ? Collection Time: 06/13/21 10:35 AM  ? Specimen: BLOOD  ?Result Value Ref Range Status  ? Specimen Description BLOOD BLOOD RIGHT WRIST  Final  ? Special Requests   Final  ?  BOTTLES DRAWN AEROBIC AND ANAEROBIC Blood Culture adequate volume  ? Culture   Final  ?  NO GROWTH < 24 HOURS ?Performed at [facility], [address] [place], [place] [address] ?  ? Report Status PENDING  Incomplete  ?  ? ? ? ? ? ?Radiology Studies: ?CT Renal Stone Study ? ?Result Date: 06/13/2021 ?CLINICAL DATA:  Flank and low back pain.  Dysuria. EXAM: CT ABDOMEN AND PELVIS WITHOUT CONTRAST TECHNIQUE: Multidetector CT imaging of the abdomen and pelvis  was performed following the standard protocol without IV contrast. RADIATION DOSE REDUCTION: This exam was performed according to the departmental dose-optimization program which includes automated exposure control, adjustment of the mA and/or kV according to patient size and/or use of iterative reconstruction technique. COMPARISON:  None. FINDINGS: Lower chest: No acute findings. Hepatobiliary: No mass visualized on this unenhanced exam. Small calcified gallstone noted, however there is no evidence of cholecystitis or biliary ductal dilatation. Pancreas: No mass or inflammatory process visualized on this unenhanced exam. Spleen:  Within normal limits in size. Adrenals/Urinary tract: No evidence of urolithiasis or hydronephrosis. Unremarkable unopacified urinary bladder. Stomach/Bowel: No evidence of obstruction, inflammatory process, or abnormal fluid collections. Vascular/Lymphatic: No pathologically  enlarged lymph nodes identified. No evidence of abdominal aortic aneurysm. Aortic atherosclerotic calcification noted. Reproductive:  No mass or other significant abnormality. Other:  None. Musculoskeletal:

## 2021-06-14 NOTE — Progress Notes (Signed)
PHARMACIST - PHYSICIAN COMMUNICATION ? ?CONCERNING: Antibiotic IV to Oral Route Change Policy ? ?RECOMMENDATION: ?This patient is receiving levofloxacin by the intravenous route.  Based on criteria approved by the Pharmacy and Therapeutics Committee, the antibiotic(s) is/are being converted to the equivalent oral dose form(s). ? ? ?DESCRIPTION: ?These criteria include: ?Patient being treated for a respiratory tract infection, urinary tract infection, cellulitis or clostridium difficile associated diarrhea if on metronidazole ?The patient is not neutropenic and does not exhibit a GI malabsorption state ?The patient is eating (either orally or via tube) and/or has been taking other orally administered medications for a least 24 hours ?The patient is improving clinically and has a Tmax < 100.5 ? ?If you have questions about this conversion, please contact the Pharmacy Department  ? ?Lynn Norton  ?06/14/21  ?  ?

## 2021-06-14 NOTE — Evaluation (Signed)
Physical Therapy Evaluation ?Patient Details ?Name: Lynn Norton ?MRN: [id number] ?DOB: [birth year] ?Today's Date: 06/14/2021 ? ?History of Present Illness ? Pt is a 71 y/o F admitted on 06/13/21 after presenting with c/c of dysuria, increased urinary frequency, & burning with urination. Pt is being treated for sepsis 2/2 UTI. Of note, pt with recent admission & underwent RUE ORIF 04/27/21 by Dr. Rudene Christians. On evaluation, pt reports she has been cleared to use RUE without restrictions. PMH: HTN, GERD, RA, tobacco abuse, PAD, CKD 4, UTI, E coli bacteremia  ?Clinical Impression ? Pt seen for PT evaluation with pt confirming she was admitted for RUE ORIF & d/c to STR but has since returned home where she ambulates without AD & denies falls. Pt notes she has a friend that assists with cooking, cleaning, & transportation. Pt received in bed soiled with urine with pt reporting this has been occurring this past week. PT assists pt with changing clothes. Pt ambulates into hallway without AD with impaired gait, but pt reports this is chronic 2/2 ~9 BLE foot surgeries, & impaired balance with pt requiring min assist for safety. Provided pt with RW & pt with improved balance but still requires close supervision<>CGA for gait with AD. Pt demonstrates decreased gait speed & decreased endurance as she requires a seated rest break between gait trials 2/2 fatigue; pt endorses she gets fatigued quickly. At this time, pt is unsafe to d/c home alone & doesn't have necessary level of care. Pt would benefit from STR upon d/c to maximize independence with functional mobility & reduce fall risk prior to return home. Will continue to follow pt acutely to address endurance, balance, gait with LRAD, and stair negotiation. ? ?   ? ?Recommendations for follow up therapy are one component of a multi-disciplinary discharge planning process, led by the attending physician.  Recommendations may be updated based on patient status, additional functional  criteria and insurance authorization. ? ?Follow Up Recommendations Skilled nursing-short term rehab (<3 hours/day) ? ?  ?Assistance Recommended at Discharge Intermittent Supervision/Assistance  ?Patient can return home with the following ? A little help with walking and/or transfers;A little help with bathing/dressing/bathroom;Assistance with cooking/housework;Assist for transportation;Help with stairs or ramp for entrance ? ?  ?Equipment Recommendations Rolling walker (2 wheels)  ?Recommendations for Other Services ?    ?  ?Functional Status Assessment Patient has had a recent decline in their functional status and demonstrates the ability to make significant improvements in function in a reasonable and predictable amount of time.  ? ?  ?Precautions / Restrictions Precautions ?Precautions: Fall ?Restrictions ?Weight Bearing Restrictions: No  ? ?  ? ?Mobility ? Bed Mobility ?Overal bed mobility: Modified Independent ?  ?  ?  ?  ?  ?  ?General bed mobility comments: supine>sit with mod I ?  ? ?Transfers ?Overall transfer level: Needs assistance ?Equipment used: None ?Transfers: Sit to/from Stand ?Sit to Stand: Supervision ?  ?  ?  ?  ?  ?  ?  ? ?Ambulation/Gait ?Ambulation/Gait assistance: Min assist ?Gait Distance (Feet): 40 Feet ?Assistive device: None ?Gait Pattern/deviations: Decreased step length - right, Decreased dorsiflexion - left, Decreased step length - left, Decreased stride length, Decreased dorsiflexion - right, Decreased weight shift to left ?Gait velocity: decreased ?  ?  ?  ? ?Stairs ?  ?  ?  ?  ?  ? ?Wheelchair Mobility ?  ? ?Modified Rankin (Stroke Patients Only) ?  ? ?  ? ?Balance Overall balance assessment: Needs assistance ?Sitting-balance  support: Feet supported, Bilateral upper extremity supported ?Sitting balance-Leahy Scale: Good ?  ?  ?Standing balance support: During functional activity, No upper extremity supported ?Standing balance-Leahy Scale: Fair ?Standing balance comment: Pt requires  CGA for dynamic standing when pulling mesh underwear below hips. ?  ?  ?  ?  ?  ?  ?  ?  ?  ?  ?  ?   ? ? ? ?Pertinent Vitals/Pain Pain Assessment ?Pain Assessment: No/denies pain  ? ? ?Home Living Family/patient expects to be discharged to:: Private residence ?Living Arrangements: Alone ?Available Help at Discharge: Friend(s);Available PRN/intermittently ?Type of Home: House ?Home Access: Stairs to enter ?Entrance Stairs-Rails: Right ?Entrance Stairs-Number of Steps: 3 ?  ?Home Layout: One level ?Home Equipment: Kasandra Knudsen - single point ?   ?  ?Prior Function   ?  ?  ?  ?  ?  ?  ?Mobility Comments: Pt reports she's been ambulating without AD after return home from STR. Pt reports no falls since being home from rehab. ?ADLs Comments: Friend drives over & assists with cooking, cleaning, & transportation. ?  ? ? ?Hand Dominance  ?   ? ?  ?Extremity/Trunk Assessment  ? Upper Extremity Assessment ?Upper Extremity Assessment: Overall WFL for tasks assessed ?  ? ?Lower Extremity Assessment ?Lower Extremity Assessment: Generalized weakness (Pt reports hx of ~9 foot surgeries & pre-existing impaired gait.) ?  ? ?   ?Communication  ? Communication: No difficulties  ?Cognition Arousal/Alertness: Awake/alert ?Behavior During Therapy: Flat affect ?Overall Cognitive Status: Within Functional Limits for tasks assessed ?  ?  ?  ?  ?  ?  ?  ?  ?  ?  ?  ?  ?  ?  ?  ?  ?General Comments: Pt endorses frustration but doesn't elaborate. PT provides encouragement/cuing for relaxation techniques. ?  ?  ? ?  ?General Comments General comments (skin integrity, edema, etc.): Pt incontinent of urine & PT assists with changing mesh underwear, pad, and new gown. ? ?  ?Exercises    ? ?Assessment/Plan  ?  ?PT Assessment Patient needs continued PT services  ?PT Problem List Decreased strength;Decreased mobility;Decreased safety awareness;Decreased activity tolerance;Decreased coordination;Cardiopulmonary status limiting activity;Decreased knowledge of  use of DME;Decreased balance ? ?   ?  ?PT Treatment Interventions DME instruction;Therapeutic activities;Modalities;Gait training;Therapeutic exercise;Patient/family education;Stair training;Balance training;Neuromuscular re-education;Functional mobility training;Manual techniques   ? ?PT Goals (Current goals can be found in the Care Plan section)  ?Acute Rehab PT Goals ?Patient Stated Goal: get better ?PT Goal Formulation: With patient ?Time For Goal Achievement: 06/28/21 ?Potential to Achieve Goals: Good ? ?  ?Frequency Min 2X/week ?  ? ? ?Co-evaluation   ?  ?  ?  ?  ? ? ?  ?AM-PAC PT "6 Clicks" Mobility  ?Outcome Measure Help needed turning from your back to your side while in a flat bed without using bedrails?: None ?Help needed moving from lying on your back to sitting on the side of a flat bed without using bedrails?: None ?Help needed moving to and from a bed to a chair (including a wheelchair)?: A Little ?Help needed standing up from a chair using your arms (e.g., wheelchair or bedside chair)?: A Little ?Help needed to walk in hospital room?: A Little ?Help needed climbing 3-5 steps with a railing? : A Lot ?6 Click Score: 19 ? ?  ?End of Session   ?Activity Tolerance: Patient tolerated treatment well;Patient limited by fatigue ?Patient left: in chair;with chair alarm set;with call  bell/phone within reach ?Nurse Communication: Mobility status ?PT Visit Diagnosis: Unsteadiness on feet (R26.81);Difficulty in walking, not elsewhere classified (R26.2);Muscle weakness (generalized) (M62.81) ?  ? ?Time: 3709-6438 ?PT Time Calculation (min) (ACUTE ONLY): 20 min ? ? ?Charges:   PT Evaluation ?$PT Eval Low Complexity: 1 Low ?PT Treatments ?$Therapeutic Activity: 8-22 mins ?  ?   ? ? ?Lavone Nian, PT, DPT ?06/14/21, 1:48 PM ? ? ?Waunita Schooner ?06/14/2021, 1:44 PM ? ?

## 2021-06-15 DIAGNOSIS — N3000 Acute cystitis without hematuria: Secondary | ICD-10-CM | POA: Diagnosis not present

## 2021-06-15 LAB — CBC WITH DIFFERENTIAL/PLATELET
Abs Immature Granulocytes: 0.08 10*3/uL — ABNORMAL HIGH (ref 0.00–0.07)
Basophils Absolute: 0 10*3/uL (ref 0.0–0.1)
Basophils Relative: 0 %
Eosinophils Absolute: 0.1 10*3/uL (ref 0.0–0.5)
Eosinophils Relative: 1 %
HCT: 30.4 % — ABNORMAL LOW (ref 36.0–46.0)
Hemoglobin: 9.8 g/dL — ABNORMAL LOW (ref 12.0–15.0)
Immature Granulocytes: 1 %
Lymphocytes Relative: 14 %
Lymphs Abs: 1.6 10*3/uL (ref 0.7–4.0)
MCH: 31.9 pg (ref 26.0–34.0)
MCHC: 32.2 g/dL (ref 30.0–36.0)
MCV: 99 fL (ref 80.0–100.0)
Monocytes Absolute: 0.4 10*3/uL (ref 0.1–1.0)
Monocytes Relative: 3 %
Neutro Abs: 9.2 10*3/uL — ABNORMAL HIGH (ref 1.7–7.7)
Neutrophils Relative %: 81 %
Platelets: 414 10*3/uL — ABNORMAL HIGH (ref 150–400)
RBC: 3.07 MIL/uL — ABNORMAL LOW (ref 3.87–5.11)
RDW: 13.7 % (ref 11.5–15.5)
WBC: 11.4 10*3/uL — ABNORMAL HIGH (ref 4.0–10.5)
nRBC: 0 % (ref 0.0–0.2)

## 2021-06-15 LAB — BASIC METABOLIC PANEL
Anion gap: 7 (ref 5–15)
BUN: 28 mg/dL — ABNORMAL HIGH (ref 8–23)
CO2: 26 mmol/L (ref 22–32)
Calcium: 8 mg/dL — ABNORMAL LOW (ref 8.9–10.3)
Chloride: 101 mmol/L (ref 98–111)
Creatinine, Ser: 1.04 mg/dL — ABNORMAL HIGH (ref 0.44–1.00)
GFR, Estimated: 57 mL/min — ABNORMAL LOW (ref >60)
Glucose, Bld: 89 mg/dL (ref 70–99)
Potassium: 4 mmol/L (ref 3.5–5.1)
Sodium: 134 mmol/L — ABNORMAL LOW (ref 135–145)

## 2021-06-15 NOTE — Progress Notes (Signed)
Discharge instructions reviewed with the patient. IV removed. Patient sent out via wheelchair with belongings 

## 2021-06-15 NOTE — Evaluation (Signed)
Occupational Therapy Evaluation ?Patient Details ?Name: Lynn Norton ?MRN: [id number] ?DOB: 12-17-1950 ?Today's Date: 06/15/2021 ? ? ?History of Present Illness Pt is a 71 y/o F admitted on 06/13/21 after presenting with c/c of dysuria, increased urinary frequency, & burning with urination. Pt is being treated for sepsis 2/2 UTI. Of note, pt with recent admission & underwent RUE ORIF 04/27/21 by Dr. Rudene Christians. On evaluation, pt reports she has been cleared to use RUE without restrictions. PMH: HTN, GERD, RA, tobacco abuse, PAD, CKD 4, UTI, E coli bacteremia  ? ?Clinical Impression ?  ?Pt was seen for OT evaluation this date. Prior to hospital admission, pt was Independent for mobility and ADLs. Pt lives alone with PRN assistance from friends. Pt presents to acute OT demonstrating impaired ADL performance and functional mobility 2/2 decreased activity tolerance and functional strength/ROM/balance deficits. Pt currently requires SBA + RW for toilet t/f and grooming standing sink side, requests use of walker for safety. Successfullly transports comb during mobility however unable to transport cup with use of RW (basline no AD use). MIN A don briefs seated on commode - assist with threading. Pt would benefit from skilled OT to address noted impairments and functional limitations (see below for any additional details). Upon hospital discharge, recommend STR to maximize pt safety and return to PLOF. ?   ? ?Recommendations for follow up therapy are one component of a multi-disciplinary discharge planning process, led by the attending physician.  Recommendations may be updated based on patient status, additional functional criteria and insurance authorization.  ? ?Follow Up Recommendations ? Skilled nursing-short term rehab (<3 hours/day)  ?  ?Assistance Recommended at Discharge Intermittent Supervision/Assistance  ?Patient can return home with the following A little help with walking and/or transfers;A little help with  bathing/dressing/bathroom;Help with stairs or ramp for entrance;Assistance with cooking/housework ? ?  ?Functional Status Assessment ? Patient has had a recent decline in their functional status and demonstrates the ability to make significant improvements in function in a reasonable and predictable amount of time.  ?Equipment Recommendations ? BSC/3in1  ?  ?Recommendations for Other Services   ? ? ?  ?Precautions / Restrictions Precautions ?Precautions: Fall ?Restrictions ?Weight Bearing Restrictions: No  ? ?  ? ?Mobility Bed Mobility ?Overal bed mobility: Modified Independent ?  ?  ?  ?  ?  ?  ?  ?  ? ?Transfers ?Overall transfer level: Needs assistance ?  ?Transfers: Sit to/from Stand ?Sit to Stand: Supervision ?  ?  ?  ?  ?  ?General transfer comment: SBA with use of grab bar and bed rail ?  ? ?  ?Balance Overall balance assessment: Needs assistance ?Sitting-balance support: No upper extremity supported, Feet supported ?Sitting balance-Leahy Scale: Good ?  ?  ?Standing balance support: During functional activity, No upper extremity supported ?Standing balance-Leahy Scale: Fair ?  ?  ?  ?  ?  ?  ?  ?  ?  ?  ?  ?  ?   ? ?ADL either performed or assessed with clinical judgement  ? ?ADL Overall ADL's : Needs assistance/impaired ?  ?  ?  ?  ?  ?  ?  ?  ?  ?  ?  ?  ?  ?  ?  ?  ?  ?  ?  ?General ADL Comments: SBA + RW for toilet t/f and grooming standing sink side. MIN A don briefs seated on commode - assist with threading. Successfullly trasnports comb during mobility however unable to  transport cup with use of RW (basline no AD use).  ? ? ? ? ?Pertinent Vitals/Pain Pain Assessment ?Pain Assessment: Faces ?Faces Pain Scale: Hurts a little bit ?Pain Location: B digits with attemps to use bed controls ?Pain Descriptors / Indicators: Dull, Discomfort ?Pain Intervention(s): Limited activity within patient's tolerance, Repositioned  ? ? ? ?Hand Dominance Right ?  ?Extremity/Trunk Assessment Upper Extremity  Assessment ?Upper Extremity Assessment: Generalized weakness ?  ?Lower Extremity Assessment ?Lower Extremity Assessment: Generalized weakness ?  ?  ?  ?Communication Communication ?Communication: No difficulties ?  ?Cognition Arousal/Alertness: Awake/alert ?Behavior During Therapy: Flat affect ?Overall Cognitive Status: Within Functional Limits for tasks assessed ?  ?  ?  ?  ?  ?  ?  ?  ?  ?  ?  ?  ?  ?  ?  ?  ?General Comments: pt asks for assistance with tasks she appears capable of completing (placing comb on counter, pulling covers up) and reports her husband died 3 weeks ago ?  ?  ? ?Home Living Family/patient expects to be discharged to:: Private residence ?Living Arrangements: Alone ?Available Help at Discharge: Friend(s);Available PRN/intermittently ?Type of Home: House ?Home Access: Stairs to enter ?Entrance Stairs-Number of Steps: 3 ?Entrance Stairs-Rails: Right ?Home Layout: One level ?  ?  ?  ?  ?  ?  ?  ?Home Equipment: Kasandra Knudsen - single point ?  ?  ?  ? ?  ?Prior Functioning/Environment Prior Level of Function : Independent/Modified Independent ?  ?  ?  ?  ?  ?  ?Mobility Comments: Pt reports she's been ambulating without AD after return home from STR. Pt reports no falls since being home from rehab. ?ADLs Comments: Friend drives over & assists with cooking, cleaning, & transportation. ?  ? ?  ?  ?OT Problem List: Decreased strength;Decreased range of motion;Decreased activity tolerance;Impaired balance (sitting and/or standing);Decreased safety awareness ?  ?   ?OT Treatment/Interventions: Self-care/ADL training;Therapeutic exercise;DME and/or AE instruction;Energy conservation;Therapeutic activities;Patient/family education;Balance training  ?  ?OT Goals(Current goals can be found in the care plan section) Acute Rehab OT Goals ?Patient Stated Goal: to go home ?OT Goal Formulation: With patient ?Time For Goal Achievement: 06/29/21 ?Potential to Achieve Goals: Good ?ADL Goals ?Pt Will Perform Grooming:  Independently;standing ?Pt Will Perform Lower Body Dressing: with modified independence;sit to/from stand ?Pt Will Transfer to Toilet: Independently;ambulating;regular height toilet  ?OT Frequency: Min 2X/week ?  ? ?Co-evaluation   ?  ?  ?  ?  ? ?  ?AM-PAC OT "6 Clicks" Daily Activity     ?Outcome Measure Help from another person eating meals?: None ?Help from another person taking care of personal grooming?: A Little ?Help from another person toileting, which includes using toliet, bedpan, or urinal?: A Little ?Help from another person bathing (including washing, rinsing, drying)?: A Little ?Help from another person to put on and taking off regular upper body clothing?: A Little ?Help from another person to put on and taking off regular lower body clothing?: A Little ?6 Click Score: 19 ?  ?End of Session Equipment Utilized During Treatment: Rolling walker (2 wheels) ? ?Activity Tolerance: Patient tolerated treatment well ?Patient left: in bed;with call bell/phone within reach;with bed alarm set ? ?OT Visit Diagnosis: Other abnormalities of gait and mobility (R26.89)  ?              ?Time: 6294-7654 ?OT Time Calculation (min): 9 min ?Charges:  OT General Charges ?$OT Visit: 1 Visit ?OT Evaluation ?$OT Eval  Low Complexity: 1 Low ? ?Dessie Coma, M.S. OTR/L  ?06/15/21, 9:47 AM  ?ascom [phone] ? ?

## 2021-06-15 NOTE — TOC Initial Note (Signed)
Transition of Care (TOC) - Initial/Assessment Note  ? ? ?Patient Details  ?Name: Lynn Norton ?MRN: [id number] ?Date of Birth: 1950-09-01 ? ?Transition of Care (TOC) CM/SW Contact:    ?Beverly Sessions, RN ?Phone Number: ?06/15/2021, 2:25 PM ? ?Clinical Narrative:                 ? ? ?  ? Patient admitted from home with UTI ?Patient lives at home alone ?Patient states that she has PCS services 2 days a week.  ? ?PCP Hande - states she drives her self ?Therapy recommending SNF.  Patient declines. Agreeable to home health. States she does not have a preference of home health.  Referral made to [facility].  Patient states that she has 3 canes, and RW in the home.  Friend to transport at discharge  ? ? ?Patient Goals and CMS Choice ?  ?  ?  ? ?Expected Discharge Plan and Services ?  ?  ?  ?  ?  ?Expected Discharge Date: 06/15/21               ?  ?  ?  ?  ?  ?  ?  ?  ?  ?  ? ?Prior Living Arrangements/Services ?  ?  ?  ?       ?  ?  ?  ?  ? ?Activities of Daily Living ?Home Assistive Devices/Equipment: Cane (specify quad or straight) ?ADL Screening (condition at time of admission) ?Patient's cognitive ability adequate to safely complete daily activities?: Yes ?Is the patient deaf or have difficulty hearing?: No ?Does the patient have difficulty seeing, even when wearing glasses/contacts?: No ?Does the patient have difficulty concentrating, remembering, or making decisions?: No ?Patient able to express need for assistance with ADLs?: Yes ?Does the patient have difficulty dressing or bathing?: Yes ?Independently performs ADLs?: Yes (appropriate for developmental age) ?Does the patient have difficulty walking or climbing stairs?: Yes ?Weakness of Legs: None ?Weakness of Arms/Hands: None ? ?Permission Sought/Granted ?  ?  ?   ?   ?   ?   ? ?Emotional Assessment ?  ?  ?  ?  ?  ?  ? ?Admission diagnosis:  Acute pyelonephritis [N10] ?Urinary tract infection without hematuria, site unspecified [N39.0] ?Patient Active  Problem List  ? Diagnosis Date Noted  ? UTI (urinary tract infection) 06/13/2021  ? CKD (chronic kidney disease) stage 4, GFR 15-29 ml/min (HCC) 06/13/2021  ? Protein-calorie malnutrition, moderate ([place]) 06/13/2021  ? Closed fracture of pubic ramus-left  06/13/2021  ? Lower back pain 06/13/2021  ? HTN (hypertension) 04/26/2021  ? Rheumatoid arthritis ([place]) 04/24/2021  ? Tobacco use 04/24/2021  ? Bacteremia due to Escherichia coli 04/23/2021  ? Sepsis ([place]) 04/22/2021  ? Closed fracture dislocation of right elbow 04/22/2021  ? AKI (acute kidney injury) ([place]) 04/22/2021  ? Frequent falls 04/22/2021  ? Hypertension 03/16/2018  ? PAD (peripheral artery disease) ([place]) 03/16/2018  ? Tobacco use disorder 08/12/2014  ? Unspecified osteoarthritis, unspecified site 11/03/2011  ? ?PCP:  [name], MD ?Pharmacy:   ?[facility] ([place], [place] ?[address] ?[address] ?[place] [address] ?Phone: [phone] Fax: [phone] ? ?[id number] - [place], [place] - [address] ?[address] ?[place] [address] ?Phone: [phone] Fax: [phone] ? ? ? ? ?Social Determinants of Health (SDOH) Interventions ?  ? ?Readmission Risk Interventions ?   ? View :  No data to display.  ?  ?  ?  ? ? ? ?

## 2021-06-15 NOTE — Progress Notes (Signed)
Mobility Specialist - Progress Note ? ? 06/15/21 1100  ?Mobility  ?Activity Ambulated with assistance in hallway  ?Level of Assistance Standby assist, set-up cues, supervision of patient - no hands on  ?Assistive Device Front wheel walker  ?Distance Ambulated (ft) 200 ft  ?Activity Response Tolerated well  ?$Mobility charge 1 Mobility  ? ? ? ?Pt lying in bed upon arrival, utilizing RA. Pt ambulated in hallway with supervision and RW. Sways, but no LOB. No complaints. Pt returned to bed with alarm set, needs in reach.  ? ? ?Kathee Delton ?Mobility Specialist ?06/15/21, 11:46 AM ? ? ? ? ?

## 2021-06-15 NOTE — Discharge Summary (Signed)
Physician Discharge Summary  ?Lynn Norton NWG:[id number] DOB: 1950-11-22 DOA: 06/13/2021 ? ?PCP: [name], MD ? ?Admit date: 06/13/2021 ?Discharge date: 06/15/2021 ? ?Admitted From: Home ?Disposition:  Home with home health ? ?Recommendations for Outpatient Follow-up:  ?Follow up with PCP in 1-2 weeks ? ? ?Home Health:Yes PT OT RN aide ?Equipment/Devices: None ? ?Discharge Condition: Stable ?CODE STATUS: DNR ?Diet recommendation: Regular ? ?Brief/Interim Summary: ?70 y.o. female with medical history significant of hypertension, GERD, rheumatoid arthritis, tobacco abuse, PAD, CKD-4, UTI, E. coli bacteremia, who presents with dysuria. ?  ?Patient states that she has dysuria for more than 3 days, also reports increased urinary frequency, burning on urination.  No hematuria.  No fever or chills.  Denies nausea vomiting, diarrhea or abdominal pain.  No chest pain, cough, shortness breath.  She also reports lower back pain.  No injury. ?  ?Presentation consistent with acute cystitis.  Started on IV antibiotics.  Feeling improved symptomatically ? ?Completed course of antibiotics in house.  No antibiotics indicated on discharge.  Back to baseline level of function.  Recommendation was for skilled nursing facility however patient declined.  Elected to go home.  Home health services ordered.  Patient discharged in stable condition.  Follow-up outpatient PCP. ? ? ? ?Discharge Diagnoses:  ?Principal Problem: ?  UTI (urinary tract infection) ?Active Problems: ?  Rheumatoid arthritis ([place]) ?  Tobacco use ?  Hypertension ?  Closed fracture of pubic ramus-left  ?  Lower back pain ?  Protein-calorie malnutrition, moderate ([place]) ?  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 ?UTI (urinary tract infection) ?Sepsis due to UTI:  ?Patient has sepsis with WBC 14.2, tachycardia with heart rate 115, RR 24.   ?Lactic acid is normal. ?Improving over interval ?Plan: ?Improved at time of discharge.  Completed course  of Levaquin in house.  No antibiotics indicated on discharge.  Discharged home with outpatient PCP follow-up. ?  ?Rheumatoid arthritis ([place]) ?- Patient is getting weekly methotrexate ?-Continue prednisone, Reduce back to home dose of 2.5 mg daily ?  ?Tobacco use ?- Nicotine patch ?  ?Hypertension ?Okay to resume home medications ?  ?Lower back pain ?See below ?  ?Closed fracture of pubic ramus-left  ?Also has lower back pain due to degenerative disc disease ?-As needed Tylenol and Percocet ?  ?Protein-calorie malnutrition, moderate ([place]) ?Ensure ?-Nutrition consult ?  ?CKD (chronic kidney disease) stage 4, GFR 15-29 ml/min (HCC) ?Renal function at baseline.  Recent baseline creatinine 2.17 on 04/25/2021.  Her creatinine is 1.23, BUN 29 ?-Outpatient nephrology/PCP follow-up ?  ?Grief ?Patient endorses recent loss of her husband ?Empathetic listening ?Pastoral consult ? ?Discharge Instructions ? ?Discharge Instructions   ? ? Diet - low sodium heart healthy   Complete by: As directed ?  ? Increase activity slowly   Complete by: As directed ?  ? ?  ? ?Allergies as of 06/15/2021   ? ?   Reactions  ? Sulfa Antibiotics Other (See Comments), Itching  ? Ceftriaxone Rash  ? Penicillins Rash, Other (See Comments)  ? Rocephin [ceftriaxone] Hives  ? Spoke with patient. States she had hives that spread in response to rocephin  ? Sulfa Antibiotics   ? Penicillins Itching  ? ?  ? ?  ?Medication List  ?  ? ?STOP taking these medications   ? ?Colchicine 0.6 MG Caps ?  ?cyclobenzaprine 10 MG tablet ?Commonly known as: FLEXERIL ?  ?meloxicam 15 MG tablet ?Commonly known as: MOBIC ?  ? ?  ? ?  TAKE these medications   ? ?albuterol 108 (90 Base) MCG/ACT inhaler ?Commonly known as: VENTOLIN HFA ?Inhale 1-2 puffs into the lungs 4 (four) times daily as needed for wheezing or shortness of breath. ?  ?amLODipine 2.5 MG tablet ?Commonly known as: NORVASC ?Take 2.5 mg by mouth daily. (Take with 5mg  tablet to equal 7.5mg  total) ?  ?amLODipine 5 MG  tablet ?Commonly known as: NORVASC ?Take 5 mg by mouth daily. ?  ?feeding supplement Liqd ?Take 237 mLs by mouth 2 (two) times daily between meals. ?  ?Fish Oil 1200 MG Caps ?Take 1 capsule by mouth daily. ?  ?folic acid 1 MG tablet ?Commonly known as: FOLVITE ?Take 1 mg by mouth daily. ?  ?glucosamine-chondroitin 500-400 MG tablet ?Take 1 tablet by mouth 2 (two) times daily. ?  ?methotrexate 2.5 MG tablet ?Commonly known as: RHEUMATREX ?Take by mouth once a week. ?  ?multivitamin with minerals tablet ?Take 1 tablet by mouth daily. ?  ?nicotine 14 mg/24hr patch ?Commonly known as: NICODERM CQ - dosed in mg/24 hours ?Place 1 patch (14 mg total) onto the skin daily. ?  ?omeprazole 20 MG tablet ?Commonly known as: PRILOSEC OTC ?Take 20 mg by mouth daily. ?  ?predniSONE 2.5 MG tablet ?Commonly known as: DELTASONE ?Take 2.5 mg by mouth daily. ?  ?traZODone 50 MG tablet ?Commonly known as: DESYREL ?TAKE 1 TABLET BY MOUTH  NIGHTLY AS NEEDED FOR SLEEP ?  ?zolpidem 10 MG tablet ?Commonly known as: AMBIEN ?Take by mouth as needed. ?  ? ?  ? ? ?Allergies  ?Allergen Reactions  ? Sulfa Antibiotics Other (See Comments) and Itching  ? Ceftriaxone Rash  ? Penicillins Rash and Other (See Comments)  ? Rocephin [Ceftriaxone] Hives  ?  Spoke with patient. States she had hives that spread in response to rocephin  ? Sulfa Antibiotics   ? Penicillins Itching  ? ? ?Consultations: ?None ? ?Procedures/Studies: ?DG Elbow Complete Left ? ?Result Date: 06/03/2021 ?CLINICAL DATA:  Fall.  Pain EXAM: LEFT ELBOW - COMPLETE 3+ VIEW COMPARISON:  None. FINDINGS: Normal alignment.  No acute fracture.  There is a joint effusion. Cloudlike calcification in soft tissues adjacent to the lateral epicondyle. This may be due to calcific tendinosis. IMPRESSION: Negative for fracture.  Positive for joint effusion. Electronically Signed   By: Franchot Gallo M.D.   On: 06/03/2021 17:27  ? ?DG Shoulder Left ? ?Result Date: 06/03/2021 ?CLINICAL DATA:  Left shoulder  and arm pain. EXAM: LEFT SHOULDER - 2+ VIEW COMPARISON:  None. FINDINGS: No evidence for an acute fracture. No evidence for shoulder dislocation or separation. Loss of acromial humeral space is compatible with chronic rotator cuff pathology. Degenerative changes at the rotator cuff insertion noted. IMPRESSION: 1. No acute bony abnormality. 2. Loss of acromial humeral space compatible with chronic rotator cuff pathology. Electronically Signed   By: Misty Stanley M.D.   On: 06/03/2021 15:00  ? ?DG Humerus Left ? ?Result Date: 06/03/2021 ?CLINICAL DATA:  pain EXAM: LEFT HUMERUS - 2+ VIEW COMPARISON:  X-ray left shoulder 06/03/2021 FINDINGS: There is no evidence of fracture or other focal bone lesions. Query left elbow effusion. Soft tissues are unremarkable. Please see separately dictated left shoulder radiograph 06/03/2021. IMPRESSION: 1. No acute displaced fracture or dislocation of the humerus. 2. Query left elbow effusion. Recommended dedicated radiograph of the left elbow for further evaluation. 3. Please see separately dictated left shoulder radiograph 06/03/2021. Electronically Signed   By: Iven Finn M.D.   On: 06/03/2021  15:03  ? ?CT Renal Stone Study ? ?Result Date: 06/13/2021 ?CLINICAL DATA:  Flank and low back pain.  Dysuria. EXAM: CT ABDOMEN AND PELVIS WITHOUT CONTRAST TECHNIQUE: Multidetector CT imaging of the abdomen and pelvis was performed following the standard protocol without IV contrast. RADIATION DOSE REDUCTION: This exam was performed according to the departmental dose-optimization program which includes automated exposure control, adjustment of the mA and/or kV according to patient size and/or use of iterative reconstruction technique. COMPARISON:  None. FINDINGS: Lower chest: No acute findings. Hepatobiliary: No mass visualized on this unenhanced exam. Small calcified gallstone noted, however there is no evidence of cholecystitis or biliary ductal dilatation. Pancreas: No mass or  inflammatory process visualized on this unenhanced exam. Spleen:  Within normal limits in size. Adrenals/Urinary tract: No evidence of urolithiasis or hydronephrosis. Unremarkable unopacified urinary bladder. [place]

## 2021-06-16 LAB — URINE CULTURE: Culture: >=100000 — AB

## 2021-06-18 LAB — CULTURE, BLOOD (ROUTINE X 2)
Culture: NO GROWTH
Culture: NO GROWTH
Special Requests: ADEQUATE
Special Requests: ADEQUATE

## 2022-07-01 ENCOUNTER — Emergency Department
Admission: EM | Admit: 2022-07-01 | Discharge: 2022-07-02 | Disposition: A | Discharge status: Home / Self Care | Payer: Medicare Other | Attending: Emergency Medicine | Admitting: Emergency Medicine

## 2022-07-01 ENCOUNTER — Emergency Department: Payer: Medicare Other

## 2022-07-01 ENCOUNTER — Other Ambulatory Visit: Payer: Self-pay

## 2022-07-01 DIAGNOSIS — R791 Abnormal coagulation profile: Secondary | ICD-10-CM | POA: Diagnosis not present

## 2022-07-01 DIAGNOSIS — Y906 Blood alcohol level of 120-199 mg/100 ml: Secondary | ICD-10-CM | POA: Insufficient documentation

## 2022-07-01 DIAGNOSIS — Z79899 Other long term (current) drug therapy: Secondary | ICD-10-CM | POA: Diagnosis not present

## 2022-07-01 DIAGNOSIS — R4781 Slurred speech: Secondary | ICD-10-CM

## 2022-07-01 DIAGNOSIS — F10921 Alcohol use, unspecified with intoxication delirium: Secondary | ICD-10-CM | POA: Diagnosis not present

## 2022-07-01 DIAGNOSIS — R531 Weakness: Secondary | ICD-10-CM | POA: Insufficient documentation

## 2022-07-01 DIAGNOSIS — R410 Disorientation, unspecified: Secondary | ICD-10-CM | POA: Diagnosis present

## 2022-07-01 LAB — DIFFERENTIAL
Abs Immature Granulocytes: 0.03 10*3/uL (ref 0.00–0.07)
Basophils Absolute: 0 10*3/uL (ref 0.0–0.1)
Basophils Relative: 0 %
Eosinophils Absolute: 0 10*3/uL (ref 0.0–0.5)
Eosinophils Relative: 0 %
Immature Granulocytes: 0 %
Lymphocytes Relative: 8 %
Lymphs Abs: 0.6 10*3/uL — ABNORMAL LOW (ref 0.7–4.0)
Monocytes Absolute: 0.7 10*3/uL (ref 0.1–1.0)
Monocytes Relative: 8 %
Neutro Abs: 6.9 10*3/uL (ref 1.7–7.7)
Neutrophils Relative %: 84 %

## 2022-07-01 LAB — PROTIME-INR
INR: 1 (ref 0.8–1.2)
Prothrombin Time: 13.4 seconds (ref 11.4–15.2)

## 2022-07-01 LAB — CBC
HCT: 36.8 % (ref 36.0–46.0)
Hemoglobin: 12.6 g/dL (ref 12.0–15.0)
MCH: 35.2 pg — ABNORMAL HIGH (ref 26.0–34.0)
MCHC: 34.2 g/dL (ref 30.0–36.0)
MCV: 102.8 fL — ABNORMAL HIGH (ref 80.0–100.0)
Platelets: 264 10*3/uL (ref 150–400)
RBC: 3.58 MIL/uL — ABNORMAL LOW (ref 3.87–5.11)
RDW: 15.1 % (ref 11.5–15.5)
WBC: 8.3 10*3/uL (ref 4.0–10.5)
nRBC: 0 % (ref 0.0–0.2)

## 2022-07-01 LAB — COMPREHENSIVE METABOLIC PANEL
ALT: 17 U/L (ref 0–44)
AST: 30 U/L (ref 15–41)
Albumin: 3.6 g/dL (ref 3.5–5.0)
Alkaline Phosphatase: 31 U/L — ABNORMAL LOW (ref 38–126)
Anion gap: 15 (ref 5–15)
BUN: 19 mg/dL (ref 8–23)
CO2: 23 mmol/L (ref 22–32)
Calcium: 10 mg/dL (ref 8.9–10.3)
Chloride: 95 mmol/L — ABNORMAL LOW (ref 98–111)
Creatinine, Ser: 1.02 mg/dL — ABNORMAL HIGH (ref 0.44–1.00)
GFR, Estimated: 58 mL/min — ABNORMAL LOW (ref >60)
Glucose, Bld: 99 mg/dL (ref 70–99)
Potassium: 3.9 mmol/L (ref 3.5–5.1)
Sodium: 133 mmol/L — ABNORMAL LOW (ref 135–145)
Total Bilirubin: 0.6 mg/dL (ref 0.3–1.2)
Total Protein: 6.3 g/dL — ABNORMAL LOW (ref 6.5–8.1)

## 2022-07-01 LAB — ETHANOL: Alcohol, Ethyl (B): 173 mg/dL — ABNORMAL HIGH (ref <10)

## 2022-07-01 LAB — APTT: aPTT: 31 seconds (ref 24–36)

## 2022-07-01 LAB — CBG MONITORING, ED: Glucose-Capillary: 95 mg/dL (ref 70–99)

## 2022-07-01 MED ORDER — SODIUM CHLORIDE 0.9 % IV BOLUS
1000.0000 mL | Freq: Once | INTRAVENOUS | Status: AC
  Administered 2022-07-02: 1000 mL via INTRAVENOUS

## 2022-07-01 NOTE — ED Notes (Signed)
Currently in CT with Dr. Vicente Males in [place] as well.

## 2022-07-01 NOTE — ED Provider Notes (Signed)
[facility] Provider Note   Event Date/Time   First MD Initiated Contact with Patient 07/01/22 2312     (approximate) History  Code Stroke  HPI Lynn Norton is a 72 y.o. female with past medical history of rheumatoid arthritis who presents via EMS complaining of left-sided weakness and confusion.  Per EMS, family was called this evening approximately 45 minutes prior to arrival after patient's family called due to her not being able to get up on the floor.  Family states that patient normally called her neighbor for help however tonight calling the family member was out of the ordinary and they were concerned about status.  Upon EMS arrival they noted patient to be somewhat confused stating that "6 quarters make $2.50". ROS: Unable to assess   Physical Exam  Triage Vital Signs: ED Triage Vitals  Enc Vitals Group     BP      Pulse      Resp      Temp      Temp src      SpO2      Weight      Height      Head Circumference      Peak Flow      Pain Score      Pain Loc      Pain Edu?      Excl. in GC?    Most recent vital signs: Vitals:   07/02/22 0940 07/02/22 0952  BP: 134/71   Pulse:  70  Resp:    Temp:    SpO2:  98%   General: Awake, oriented x4. CV:  Good peripheral perfusion.  Resp:  Normal effort.  Abd:  No distention.  Other:  Elderly well-developed, well-nourished Caucasian female laying in bed in no acute distress.  NIHSS 0 ED Results / Procedures / Treatments  Labs (all labs ordered are listed, but only abnormal results are displayed) Labs Reviewed  ETHANOL - Abnormal; Notable for the following components:      Result Value   Alcohol, Ethyl (B) 173 (*)    All other components within normal limits  CBC - Abnormal; Notable for the following components:   RBC 3.58 (*)    MCV 102.8 (*)    MCH 35.2 (*)    All other components within normal limits  DIFFERENTIAL - Abnormal; Notable for the following components:   Lymphs Abs 0.6 (*)     All other components within normal limits  COMPREHENSIVE METABOLIC PANEL - Abnormal; Notable for the following components:   Sodium 133 (*)    Chloride 95 (*)    Creatinine, Ser 1.02 (*)    Total Protein 6.3 (*)    Alkaline Phosphatase 31 (*)    GFR, Estimated 58 (*)    All other components within normal limits  URINALYSIS, ROUTINE W REFLEX MICROSCOPIC - Abnormal; Notable for the following components:   Color, Urine STRAW (*)    APPearance CLEAR (*)    Specific Gravity, Urine 1.004 (*)    All other components within normal limits  PROTIME-INR  APTT  URINE DRUG SCREEN, QUALITATIVE ([facility] ONLY)  CBG MONITORING, ED   EKG ED ECG REPORT I, Merwyn Katos, the attending physician, personally viewed and interpreted this ECG. Date: 07/01/2022 EKG Time: 2322 Rate: 89 Rhythm: normal sinus rhythm QRS Axis: normal Intervals: normal ST/T Wave abnormalities: normal Narrative Interpretation: no evidence of acute ischemia RADIOLOGY ED MD interpretation: CT of the head without contrast interpreted  by me shows no evidence of acute abnormalities including no intracerebral hemorrhage, obvious masses, or significant edema -Agree with radiology assessment Official radiology report(s): No results found. PROCEDURES: Critical Care performed: No .1-3 Lead EKG Interpretation  Performed by: Merwyn Katos, MD Authorized by: Merwyn Katos, MD     Interpretation: normal     ECG rate:  71   ECG rate assessment: normal     Rhythm: sinus rhythm     Ectopy: none     Conduction: normal    MEDICATIONS ORDERED IN ED: Medications  sodium chloride 0.9 % bolus 1,000 mL (0 mLs Intravenous Stopped 07/02/22 0112)   IMPRESSION / MDM / ASSESSMENT AND PLAN / ED COURSE  I reviewed the triage vital signs and the nursing notes.                             The patient is on the cardiac monitor to evaluate for evidence of arrhythmia and/or significant heart rate changes. Patient's presentation is most  consistent with acute presentation with potential threat to life or bodily function. Presents with altered mental status. +Slurred, sluggish behavior. Stated EtOH intoxication. Airway maintained. Unlikely intracranial bleed, opioid intoxication or coingestion, sepsis, hypothyroidism. Suspect likely transient course of intoxication with expected  improvement of symptoms as patient metabolizes offending agent.  Plan: frequent reassessments  Reassessment Note: Time: 4 hours since initial presentation. Evaluation: Frequent mental status exams showed improving symptoms and evidence that the patients AMS was secondary to intoxication. Pt able to ambulate without difficulty and PO tolerant. Plan DC home with ride and return precautions. Disposition: Discharge home    FINAL CLINICAL IMPRESSION(S) / ED DIAGNOSES   Final diagnoses:  Alcohol intoxication with delirium (HCC)  Slurred speech  Left-sided weakness   Rx / DC Orders   ED Discharge Orders     None      Note:  This document was prepared using Dragon voice recognition software and may include unintentional dictation errors.   Merwyn Katos, MD 07/03/22 [phone]

## 2022-07-01 NOTE — ED Notes (Signed)
Pt told this RN that she drinks wine daily "2 glasses a day". MD notified.

## 2022-07-01 NOTE — ED Triage Notes (Addendum)
Per EMS family sts pt called and appeared altered at approx 2000 and pt sts she has fallen multiple times tonight. Per Ems pt had a droop to the L arm and while walking she was leaning over  to the L. EMS sts pt was altered and was unable to answer questions for them when they arrived approx 2216, pt is currently answering questions appropriately for this RN. Unable to determine LKW at this time.  [phone] Vonna Kotyk (contact)

## 2022-07-01 NOTE — Consult Note (Signed)
[facility] Tele[facility]urology Consult Services   Patient Name:   Lynn Norton, Lynn Norton Date of Birth:   1950/07/13 Identification Number:   MRN - [id number] Date of Service:   07/01/2022 23:23:11  Diagnosis:       R26.81 - Unsteady gait       Y91.1 - Moderate alcohol intoxication  Impression:      72 year old female with difficulty ambulating with unsteady gait and moderate alcohol intoxication. ML secondary to the alcohol. Recommend continued reassessments as she sobers to see if there is symptom resolution. No signs/symptoms of stroke on examination with NIH of 0. Thanks for the consultation.  Our recommendations are outlined below.  Recommendations:        Stroke/Telemetry Floor       Neuro Checks       Bedside Swallow Eval       DVT Prophylaxis       IV Fluids, Normal Saline       Head of Bed 30 Degrees       Euglycemia and Avoid Hyperthermia (PRN Acetaminophen)       Antihypertensives PRN if Blood pressure is greater than 220/120 or there is a concern for End organ damage/contraindications for permissive HTN. If blood pressure is greater than 220/120 give labetalol PO or IV or Vasotec IV with a goal of 15% reduction in BP during the first 24 hours.  Sign Out:       Discussed with Emergency Department Provider    ------------------------------------------------------------------------------  Advanced Imaging: Advanced Imaging Deferred because:  Non-disabling symptoms as verified by the patient; no cortical signs so not consistent with LVO  Stroke not suspected with clinical presentation and exam   Metrics: Last Known Well: Unknown [facility] Notification Time: 07/01/2022 23:23:11 Arrival Time: 07/01/2022 23:06:00 Stamp Time: 07/01/2022 23:23:11 Initial Response Time: 07/01/2022 23:35:25 Symptoms: Left sided weakness and confusion. Initial patient interaction: 07/01/2022 23:43:01 NIHSS Assessment Completed: 07/01/2022 23:48:41 Patient is not a  candidate for Thrombolytic. Thrombolytic Medical Decision: 07/01/2022 23:48:42 Patient was not deemed candidate for Thrombolytic because of following reasons: Last Well Known Above 4.5 Hours. Other Diagnosis suspected.  CT head showed no acute hemorrhage or acute core infarct.  Primary Provider Notified of Diagnostic Impression and Management Plan on: 07/01/2022 23:58:54    ------------------------------------------------------------------------------  History of Present Illness: Patient is a 72 year old Female.  Patient was brought by EMS for symptoms of Left sided weakness and confusion. This is a 72 year old female with RA with left sided symptoms presenting with recurrent falls and having trouble with ambulation. The patient states that she was thinking about her deceased husband and it made her so sad today. She admits to drinking alcohol. Asked to see her for further evaluation.   Past Medical History:      Hypertension  Medications:  No Anticoagulant use  No Antiplatelet use Reviewed EMR for current medications  Allergies:  Reviewed  Social History: Alcohol Use: Yes  Family History:  There is no family history of premature cerebrovascular disease pertinent to this consultation  ROS : 14 Points Review of Systems was performed and was negative except mentioned in HPI.  Past Surgical History: There Is No Surgical History Contributory To Today's Visit    Examination: BP(116/64), Pulse(76), 1A: Level of Consciousness - Alert; keenly responsive + 0 1B: Ask Month and Age - Both Questions Right + 0 1C: Blink Eyes & Squeeze Hands - Performs Both Tasks + 0 2: Test Horizontal Extraocular Movements - Normal + 0  3: Test Visual Fields - No Visual Loss + 0 4: Test Facial Palsy (Use Grimace if Obtunded) - Normal symmetry + 0 5A: Test Left Arm Motor Drift - No Drift for 10 Seconds + 0 5B: Test Right Arm Motor Drift - No Drift for 10 Seconds + 0 6A: Test Left Leg Motor  Drift - No Drift for 5 Seconds + 0 6B: Test Right Leg Motor Drift - No Drift for 5 Seconds + 0 7: Test Limb Ataxia (FNF/Heel-Shin) - No Ataxia + 0 8: Test Sensation - Normal; No sensory loss + 0 9: Test Language/Aphasia - Normal; No aphasia + 0 10: Test Dysarthria - Normal + 0 11: Test Extinction/Inattention - No abnormality + 0  NIHSS Score: 0  NIHSS Free Text : Mildly confused.  Pre-Morbid Modified Rankin Scale: 2 Points = Slight disability; unable to carry out all previous activities, but able to look after own affairs without assistance  Spoke with : Dr. Donna Bernard, MD  Patient/Family was informed the Neurology Consult would occur via TeleHealth consult by way of interactive audio and video telecommunications and consented to receiving care in this manner.   Patient is being evaluated for possible acute neurologic impairment and high probability of imminent or life-threatening deterioration. I spent total of 35 minutes providing care to this patient, including time for face to face visit via telemedicine, review of medical records, imaging studies and discussion of findings with providers, the patient and/or family.   Dr Benay Spice   [facility] For Inpatient follow-up with [facility] physician please call [facility] [phone]. This is not an outpatient service. Post hospital discharge, please contact hospital directly.  Please do not communicate with [facility] physicians via secure chat. If you have any questions, Please contact [facility]. Please call or reconsult our service if there are any clinical or diagnostic changes.

## 2022-07-01 NOTE — ED Notes (Signed)
Dr. Ammie Ferrier

## 2022-07-01 NOTE — Progress Notes (Incomplete)
Elert for patient at 2306, biba after several falls at home.  Unable to determine LKW, per EMS patient called family around 1930/2000 and stated something was wrong, family arrived after that and called EMS after patient unable to walk well and continuing to fall.  EDMD at beside at 2306, patient already in CT.  Out of CT at 2315, TS paged at 2323.  TS MD Dr. Ammie Ferrier on call at 2340, no lytic due to resolved symptoms, off call at 2351.

## 2022-07-01 NOTE — ED Notes (Signed)
Teleneuro started in CT with pt. [name] RN on the screen.

## 2022-07-01 NOTE — ED Notes (Signed)
Md now on tele screen getting report from christina RN

## 2022-07-01 NOTE — Progress Notes (Signed)
Elert for patient at 2306, biba after several falls at home.  Unable to determine LKW, per EMS patient called family around 1930/2000 and stated something was wrong, family arrived after that and called EMS after patient unable to walk well and continuing to fall.  EDMD at beside at 2306, patient already in CT.  Out of CT at 2315, TS paged at 2323.  TS MD Dr. Ammie Ferrier on call at 2335, no lytic due to LKW greater than 4.5 hrs, other dx suspected.Off call at 2351.

## 2022-07-01 NOTE — ED Notes (Signed)
Pt ambulated to the toilet in her room with this RN's assistance however pt did not urinate in the hat. Will attempt later.

## 2022-07-02 LAB — URINE DRUG SCREEN, QUALITATIVE (ARMC ONLY)
Amphetamines, Ur Screen: NOT DETECTED
Barbiturates, Ur Screen: NOT DETECTED
Benzodiazepine, Ur Scrn: NOT DETECTED
Cannabinoid 50 Ng, Ur ~~LOC~~: NOT DETECTED
Cocaine Metabolite,Ur ~~LOC~~: NOT DETECTED
MDMA (Ecstasy)Ur Screen: NOT DETECTED
Methadone Scn, Ur: NOT DETECTED
Opiate, Ur Screen: NOT DETECTED
Phencyclidine (PCP) Ur S: NOT DETECTED
Tricyclic, Ur Screen: NOT DETECTED

## 2022-07-02 LAB — URINALYSIS, ROUTINE W REFLEX MICROSCOPIC
Bilirubin Urine: NEGATIVE
Glucose, UA: NEGATIVE mg/dL
Hgb urine dipstick: NEGATIVE
Ketones, ur: NEGATIVE mg/dL
Leukocytes,Ua: NEGATIVE
Nitrite: NEGATIVE
Protein, ur: NEGATIVE mg/dL
Specific Gravity, Urine: 1.004 — ABNORMAL LOW (ref 1.005–1.030)
pH: 6 (ref 5.0–8.0)

## 2022-07-02 NOTE — ED Notes (Signed)
Pt ambulated to the toilet with this RN's assistance 

## 2022-07-02 NOTE — ED Notes (Signed)
Spoke with pt's emergency contact Erskine Squibb who states she can pick up the pt approx 0900. MD Bradler notified.

## 2022-07-02 NOTE — ED Notes (Signed)
Assisted pt to the bathroom

## 2022-08-02 ENCOUNTER — Other Ambulatory Visit: Payer: Self-pay | Admitting: *Deleted

## 2022-08-02 DIAGNOSIS — Z122 Encounter for screening for malignant neoplasm of respiratory organs: Secondary | ICD-10-CM

## 2022-08-02 DIAGNOSIS — Z87891 Personal history of nicotine dependence: Secondary | ICD-10-CM

## 2022-08-02 DIAGNOSIS — F17210 Nicotine dependence, cigarettes, uncomplicated: Secondary | ICD-10-CM

## 2022-08-31 IMAGING — CT CT RENAL STONE PROTOCOL
2 of 4 series · 16 of 46 positions shown, 18 images · non-contrast
Comparison: None.

CLINICAL DATA: Flank and low back pain.  Dysuria.



[Series 2: ap without · axial · non-contrast · 0.55mm/px · z∈[-1086,-746]mm · 13 of 78 slices shown, 15 images]
[im 5/78  soft-tissue]
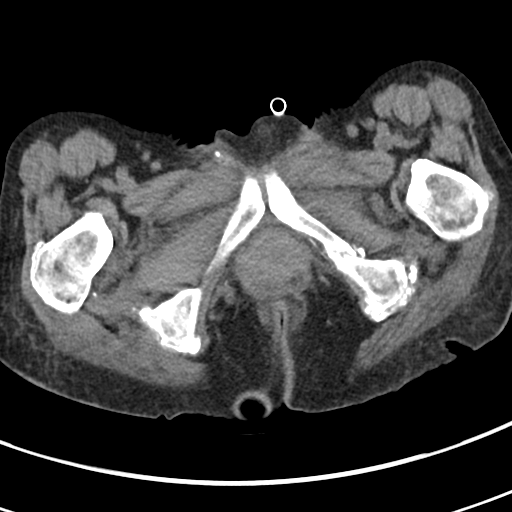
[im 5/78  bone]
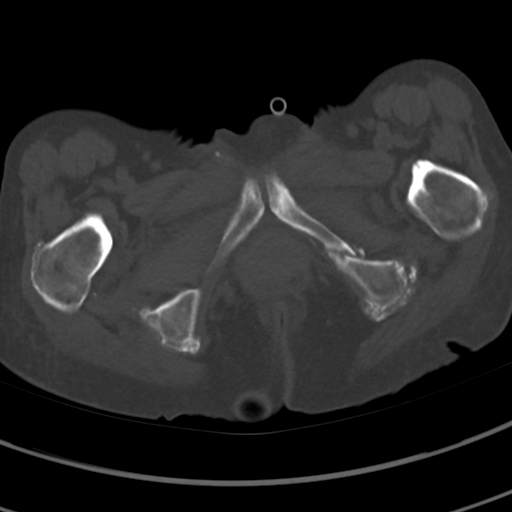
[im 10/78  soft-tissue]
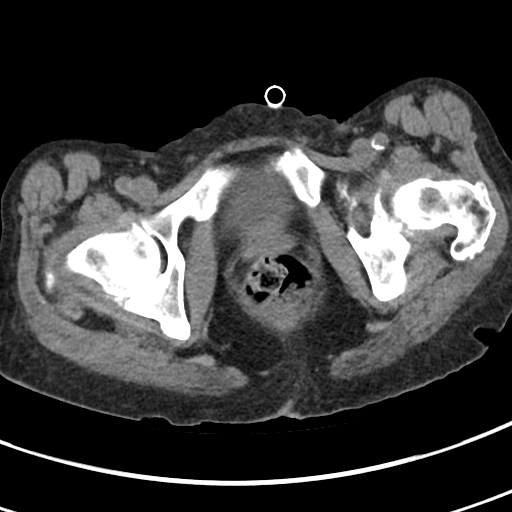
[im 19/78  soft-tissue]
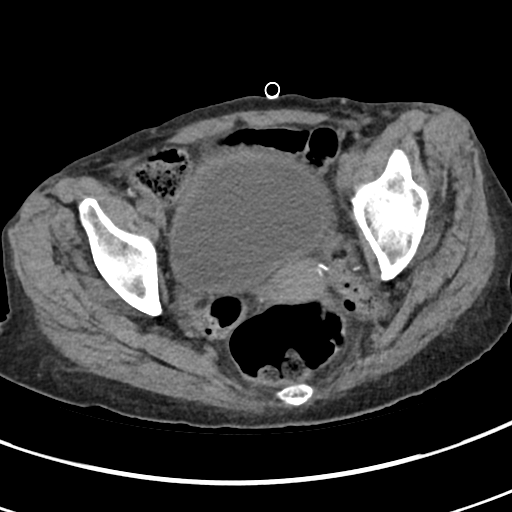
[im 23/78  soft-tissue]
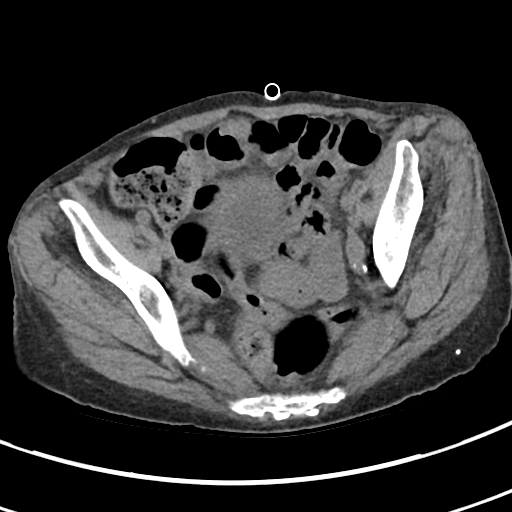
[im 28/78  soft-tissue]
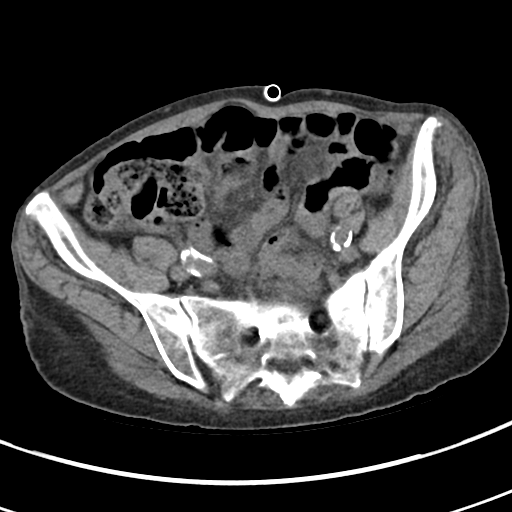
[im 32/78  soft-tissue]
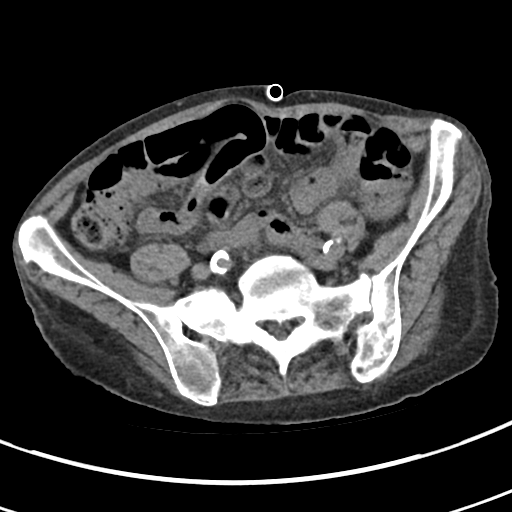
[im 41/78  soft-tissue]
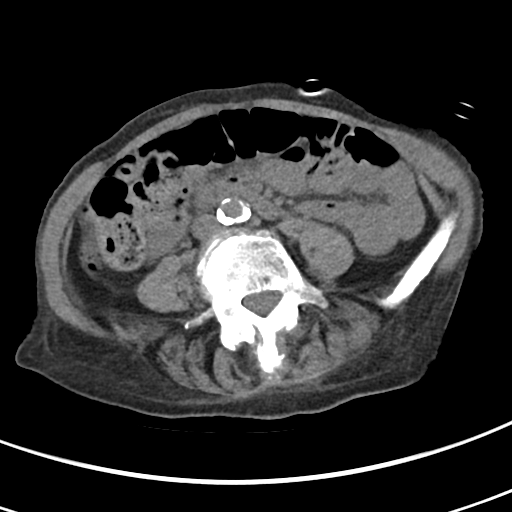
[im 46/78  soft-tissue]
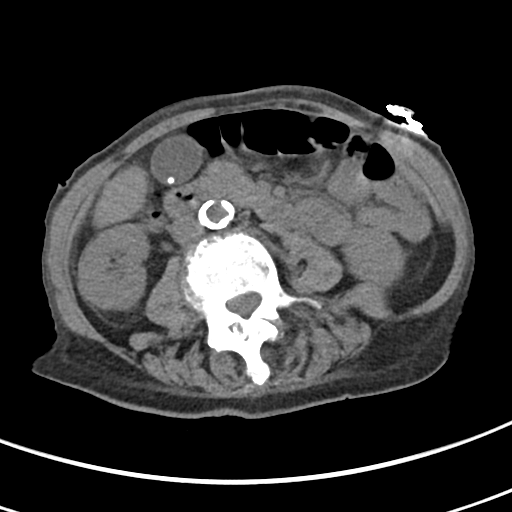
[im 50/78  soft-tissue]
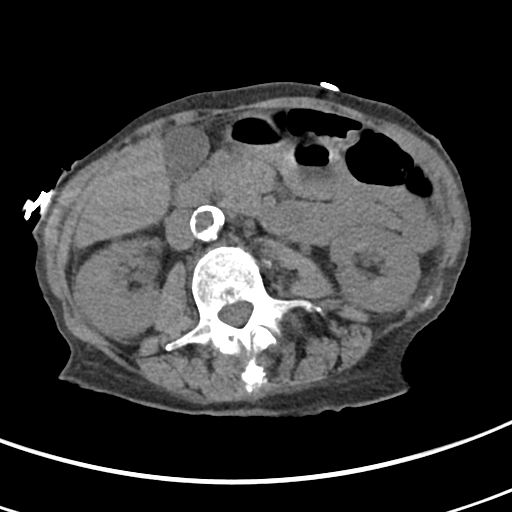
[im 50/78  bone]
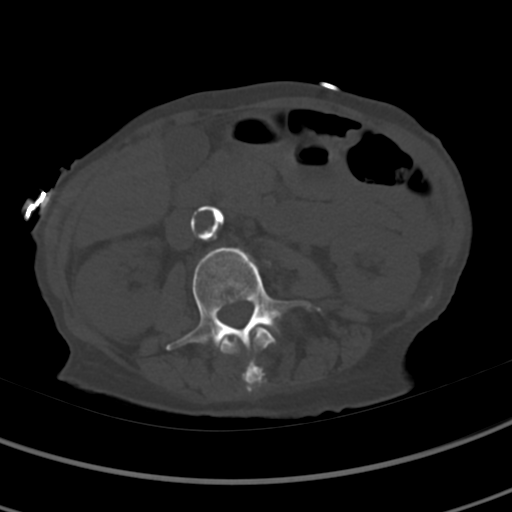
[im 55/78  soft-tissue]
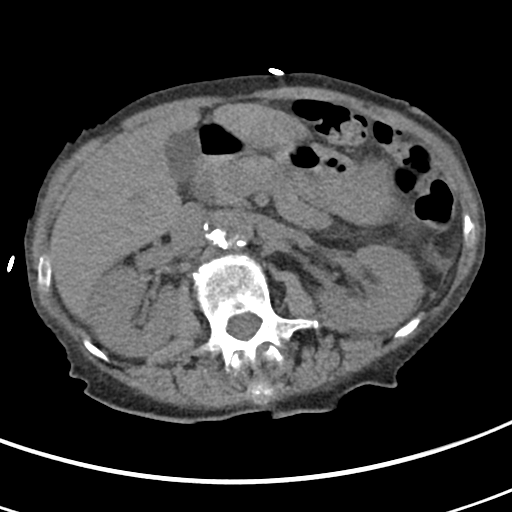
[im 59/78  soft-tissue]
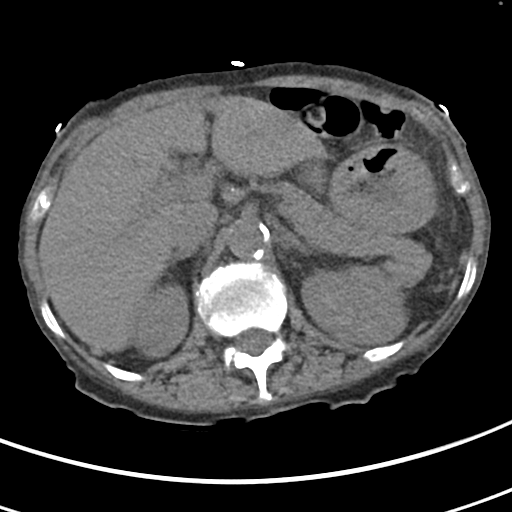
[im 68/78  soft-tissue]
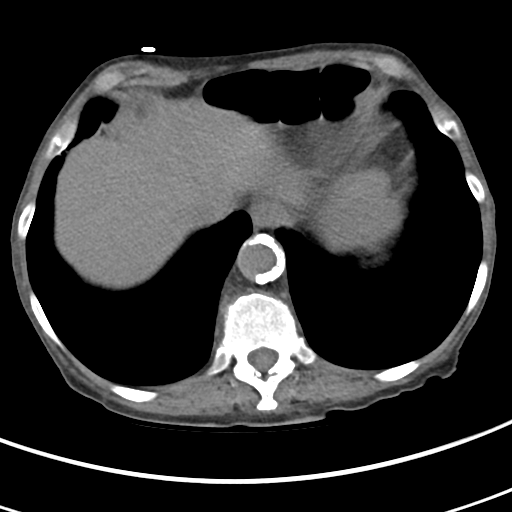
[im 73/78  soft-tissue]
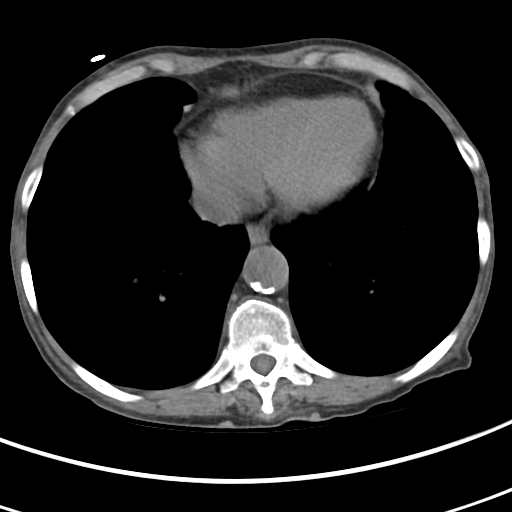

[Series 5: cor · coronal · 0.56mm/px · 3 of 74 slices shown]
[im 25/74  soft-tissue]
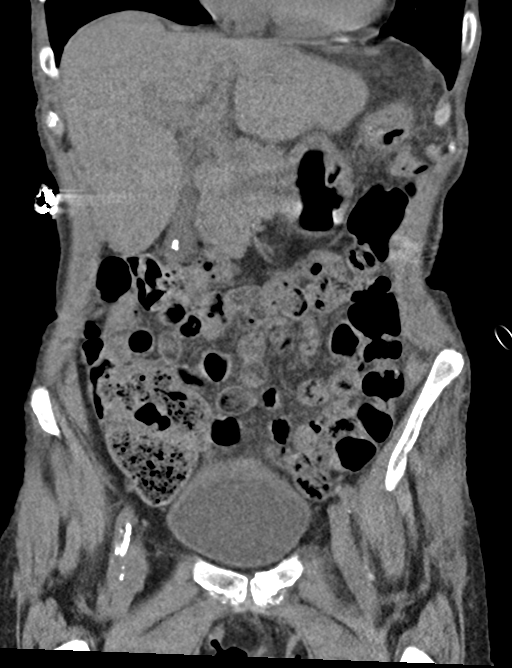
[im 33/74  soft-tissue]
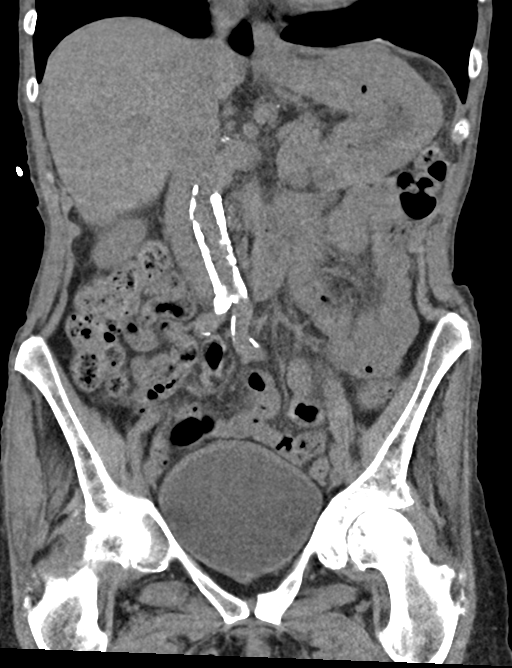
[im 41/74  soft-tissue]
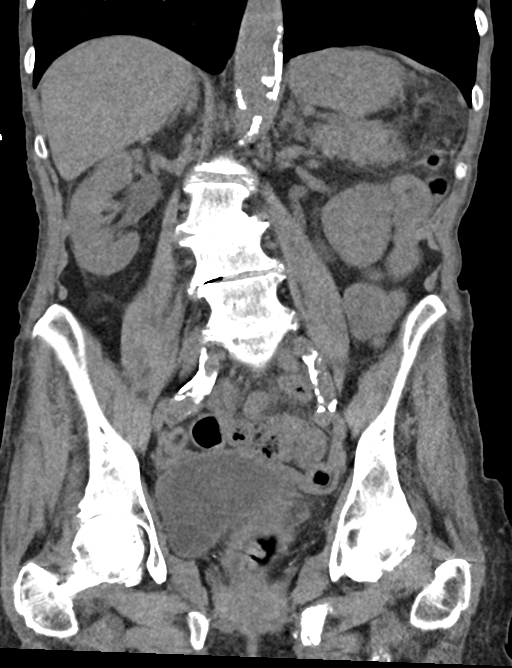

[16 of 46 positions shown; findings below may reference images not displayed]

FINDINGS: Lower chest: No acute findings.

Hepatobiliary: No mass visualized on this unenhanced exam. Small
calcified gallstone noted, however there is no evidence of
cholecystitis or biliary ductal dilatation.

Pancreas: No mass or inflammatory process visualized on this
unenhanced exam.

Spleen:  Within normal limits in size.

Adrenals/Urinary tract: No evidence of urolithiasis or
hydronephrosis. Unremarkable unopacified urinary bladder.

Stomach/Bowel: No evidence of obstruction, inflammatory process, or
abnormal fluid collections.

Vascular/Lymphatic: No pathologically enlarged lymph nodes
identified. No evidence of abdominal aortic aneurysm. Aortic
atherosclerotic calcification noted.

Reproductive:  No mass or other significant abnormality.

Other:  None.

Musculoskeletal: No suspicious bone lesions identified. Subacute
healing fractures the left superior and inferior pubic rami are
noted. Severe lumbar spine degenerative disc disease is also seen.
IMPRESSION: No evidence of urolithiasis, hydronephrosis, or other acute
findings.

Cholelithiasis. No radiographic evidence of cholecystitis.

Subacute healing fractures of left superior and inferior pubic rami.

Severe lumbar spine degenerative disc disease.

## 2022-09-07 ENCOUNTER — Encounter: Payer: Medicare Other | Admitting: Acute Care

## 2022-09-08 ENCOUNTER — Emergency Department: Payer: Medicare Other

## 2022-09-08 ENCOUNTER — Emergency Department
Admission: EM | Admit: 2022-09-08 | Discharge: 2022-09-10 | Disposition: A | Discharge status: Home / Self Care | Payer: Medicare Other | Attending: Emergency Medicine | Admitting: Emergency Medicine

## 2022-09-08 ENCOUNTER — Other Ambulatory Visit: Payer: Self-pay

## 2022-09-08 ENCOUNTER — Ambulatory Visit: Payer: Medicare Other

## 2022-09-08 DIAGNOSIS — R296 Repeated falls: Secondary | ICD-10-CM | POA: Insufficient documentation

## 2022-09-08 DIAGNOSIS — N189 Chronic kidney disease, unspecified: Secondary | ICD-10-CM | POA: Insufficient documentation

## 2022-09-08 DIAGNOSIS — I129 Hypertensive chronic kidney disease with stage 1 through stage 4 chronic kidney disease, or unspecified chronic kidney disease: Secondary | ICD-10-CM | POA: Diagnosis not present

## 2022-09-08 DIAGNOSIS — W19XXXA Unspecified fall, initial encounter: Secondary | ICD-10-CM | POA: Insufficient documentation

## 2022-09-08 DIAGNOSIS — S59912A Unspecified injury of left forearm, initial encounter: Secondary | ICD-10-CM | POA: Diagnosis present

## 2022-09-08 DIAGNOSIS — S51812A Laceration without foreign body of left forearm, initial encounter: Secondary | ICD-10-CM | POA: Diagnosis not present

## 2022-09-08 DIAGNOSIS — S60212A Contusion of left wrist, initial encounter: Secondary | ICD-10-CM | POA: Diagnosis not present

## 2022-09-08 DIAGNOSIS — E871 Hypo-osmolality and hyponatremia: Secondary | ICD-10-CM | POA: Diagnosis not present

## 2022-09-08 DIAGNOSIS — Y92009 Unspecified place in unspecified non-institutional (private) residence as the place of occurrence of the external cause: Secondary | ICD-10-CM | POA: Insufficient documentation

## 2022-09-08 LAB — URINALYSIS, ROUTINE W REFLEX MICROSCOPIC
Bacteria, UA: NONE SEEN
Bilirubin Urine: NEGATIVE
Glucose, UA: NEGATIVE mg/dL
Ketones, ur: NEGATIVE mg/dL
Leukocytes,Ua: NEGATIVE
Nitrite: NEGATIVE
Protein, ur: NEGATIVE mg/dL
Specific Gravity, Urine: 1.008 (ref 1.005–1.030)
pH: 7 (ref 5.0–8.0)

## 2022-09-08 LAB — COMPREHENSIVE METABOLIC PANEL
ALT: 18 U/L (ref 0–44)
AST: 35 U/L (ref 15–41)
Albumin: 3.6 g/dL (ref 3.5–5.0)
Alkaline Phosphatase: 49 U/L (ref 38–126)
Anion gap: 11 (ref 5–15)
BUN: 17 mg/dL (ref 8–23)
CO2: 25 mmol/L (ref 22–32)
Calcium: 9.1 mg/dL (ref 8.9–10.3)
Chloride: 94 mmol/L — ABNORMAL LOW (ref 98–111)
Creatinine, Ser: 0.76 mg/dL (ref 0.44–1.00)
GFR, Estimated: >60 mL/min (ref >60)
Glucose, Bld: 97 mg/dL (ref 70–99)
Potassium: 4 mmol/L (ref 3.5–5.1)
Sodium: 130 mmol/L — ABNORMAL LOW (ref 135–145)
Total Bilirubin: 0.8 mg/dL (ref 0.3–1.2)
Total Protein: 6.9 g/dL (ref 6.5–8.1)

## 2022-09-08 LAB — CBC WITH DIFFERENTIAL/PLATELET
Abs Immature Granulocytes: 0.03 10*3/uL (ref 0.00–0.07)
Basophils Absolute: 0.1 10*3/uL (ref 0.0–0.1)
Basophils Relative: 1 %
Eosinophils Absolute: 0.1 10*3/uL (ref 0.0–0.5)
Eosinophils Relative: 1 %
HCT: 39.1 % (ref 36.0–46.0)
Hemoglobin: 13.3 g/dL (ref 12.0–15.0)
Immature Granulocytes: 0 %
Lymphocytes Relative: 8 %
Lymphs Abs: 0.7 10*3/uL (ref 0.7–4.0)
MCH: 34.2 pg — ABNORMAL HIGH (ref 26.0–34.0)
MCHC: 34 g/dL (ref 30.0–36.0)
MCV: 100.5 fL — ABNORMAL HIGH (ref 80.0–100.0)
Monocytes Absolute: 1.2 10*3/uL — ABNORMAL HIGH (ref 0.1–1.0)
Monocytes Relative: 13 %
Neutro Abs: 7.3 10*3/uL (ref 1.7–7.7)
Neutrophils Relative %: 77 %
Platelets: 313 10*3/uL (ref 150–400)
RBC: 3.89 MIL/uL (ref 3.87–5.11)
RDW: 14.6 % (ref 11.5–15.5)
WBC: 9.4 10*3/uL (ref 4.0–10.5)
nRBC: 0 % (ref 0.0–0.2)

## 2022-09-08 LAB — TROPONIN I (HIGH SENSITIVITY): Troponin I (High Sensitivity): 12 ng/L (ref <18)

## 2022-09-08 MED ORDER — TRAZODONE HCL 50 MG PO TABS
50.0000 mg | ORAL_TABLET | Freq: Once | ORAL | Status: AC
  Administered 2022-09-08: 50 mg via ORAL
  Filled 2022-09-08: qty 1

## 2022-09-08 MED ORDER — NICOTINE 14 MG/24HR TD PT24
14.0000 mg | MEDICATED_PATCH | Freq: Once | TRANSDERMAL | Status: AC
  Administered 2022-09-08: 14 mg via TRANSDERMAL
  Filled 2022-09-08: qty 1

## 2022-09-08 MED ORDER — SODIUM CHLORIDE 0.9 % IV BOLUS
500.0000 mL | Freq: Once | INTRAVENOUS | Status: AC
  Administered 2022-09-08: 500 mL via INTRAVENOUS

## 2022-09-08 MED ORDER — MIRTAZAPINE 15 MG PO TABS
7.5000 mg | ORAL_TABLET | Freq: Once | ORAL | Status: AC
  Administered 2022-09-08: 7.5 mg via ORAL
  Filled 2022-09-08: qty 1

## 2022-09-08 NOTE — TOC CM/SW Note (Signed)
Cm received TOC consult for SNF placement. Cm awaitng for PT/OT evaluation. Cm will continue to assess.

## 2022-09-08 NOTE — ED Notes (Signed)
Patient requesting sleeping medications. She stated she takes Remeron and Trazadone. MD Ray notified of the same.

## 2022-09-08 NOTE — ED Notes (Signed)
LFA dressings removed after soaking with water due to being so dried to skin tears x2. Per pt these were placed by EMS. Xeroform placed with kerlex. Minimal bleeding from dressing removal but controlled with new dressing.

## 2022-09-08 NOTE — ED Notes (Signed)
RN attempted to get UA with In and out cath. RN to ask another staff member due to pt anatomy being small and not able to visualize the urethral opening.
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
## 2022-09-08 NOTE — ED Triage Notes (Signed)
Pt arrives via EMS from home due to a fall that occurred yesterday. Pt does have a skin tear on the left forearm that has been bandaged and bleeding is controlled. Pt sts that she falls frequently. Pt does live at home by herself alone according to EMS.

## 2022-09-09 MED ORDER — OMEPRAZOLE MAGNESIUM 20 MG PO TBEC: 20.0000 mg | DELAYED_RELEASE_TABLET | Freq: Every day | ORAL | Status: DC

## 2022-09-09 MED ORDER — AMLODIPINE BESYLATE 5 MG PO TABS
2.5000 mg | ORAL_TABLET | Freq: Every day | ORAL | Status: DC
  Administered 2022-09-09 – 2022-09-10 (×2): 2.5 mg via ORAL
  Filled 2022-09-09 (×2): qty 1

## 2022-09-09 MED ORDER — PREDNISONE 10 MG PO TABS
5.0000 mg | ORAL_TABLET | Freq: Every day | ORAL | Status: DC
  Administered 2022-09-09 – 2022-09-10 (×2): 5 mg via ORAL
  Filled 2022-09-09 (×2): qty 1

## 2022-09-09 MED ORDER — PANTOPRAZOLE SODIUM 40 MG PO TBEC
40.0000 mg | DELAYED_RELEASE_TABLET | Freq: Every day | ORAL | Status: DC
  Administered 2022-09-10: 40 mg via ORAL
  Filled 2022-09-09: qty 1

## 2022-09-09 MED ORDER — NICOTINE 14 MG/24HR TD PT24
14.0000 mg | MEDICATED_PATCH | Freq: Every day | TRANSDERMAL | Status: DC
  Administered 2022-09-09 – 2022-09-10 (×2): 14 mg via TRANSDERMAL
  Filled 2022-09-09 (×2): qty 1

## 2022-09-09 MED ORDER — TRAZODONE HCL 50 MG PO TABS
50.0000 mg | ORAL_TABLET | Freq: Every evening | ORAL | Status: DC | PRN
  Administered 2022-09-09: 50 mg via ORAL
  Filled 2022-09-09: qty 1

## 2022-09-09 MED ORDER — AMLODIPINE BESYLATE 5 MG PO TABS: 5.0000 mg | ORAL_TABLET | Freq: Every day | ORAL | Status: DC

## 2022-09-09 MED ORDER — MIRTAZAPINE 15 MG PO TABS
7.5000 mg | ORAL_TABLET | Freq: Every day | ORAL | Status: DC
  Administered 2022-09-09: 7.5 mg via ORAL
  Filled 2022-09-09: qty 1

## 2022-09-09 MED ORDER — PAROXETINE HCL 10 MG PO TABS
10.0000 mg | ORAL_TABLET | Freq: Every day | ORAL | Status: DC
  Administered 2022-09-10: 10 mg via ORAL
  Filled 2022-09-09: qty 1

## 2022-09-09 MED ORDER — METHOTREXATE SODIUM 2.5 MG PO TABS: 20.0000 mg | ORAL_TABLET | ORAL | Status: DC
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
## 2022-09-09 NOTE — Evaluation (Signed)
Physical Therapy Evaluation Patient Details Name: Lynn Norton MRN: [id number] DOB: [birth year] Today's Date: 09/09/2022  History of Present Illness  Lynn Norton is a 72 y.o. female with history of HTN, PAD, AKI, CKD and as listed in EMR presents to the emergency department for treatment and evaluation due to fall at home.  Patient states that she fell yesterday.  She has a skin tear on her left forearm but otherwise denies pain.  She lives at home alone but has a friend who checks on her frequently.    Clinical Impression  Pt received in supine position and agreeable to therapy.  Pt with increased difficulty trying to stand from lowered hospital bed, however was able to perform without assistance from therapist, just guarding for safety.  Pt does display some imbalance initially upon standing, then displayed better balance as she ambulates further distances.  Pt did have 2-3 instances of instability during ambulation, where she stumbled over her own feet, crossing over during gait, but was able to maintain balance throughout.  CGA was utilize during assessment for safety concerns.  Pt states she is close to her baseline levels, but would benefit from continued PT at home if possible.           Assistance Recommended at Discharge PRN  If plan is discharge home, recommend the following:  Can travel by private vehicle  A little help with walking and/or transfers;A little help with bathing/dressing/bathroom        Equipment Recommendations None recommended by PT  Recommendations for Other Services       Functional Status Assessment Patient has had a recent decline in their functional status and demonstrates the ability to make significant improvements in function in a reasonable and predictable amount of time.     Precautions / Restrictions Precautions Precautions: None Restrictions Weight Bearing Restrictions: No      Mobility  Bed Mobility Overal bed mobility: Needs  Assistance Bed Mobility: Supine to Sit     Supine to sit: Supervision     General bed mobility comments: extra time and effort needed.    Transfers Overall transfer level: Needs assistance Equipment used: None Transfers: Sit to/from Stand Sit to Stand: Supervision, Min guard           General transfer comment: extra time and effort needed, some sporadic movements initially which threw off balance but was able to self correct.    Ambulation/Gait Ambulation/Gait assistance: Min guard Gait Distance (Feet): 300 Feet Assistive device: None Gait Pattern/deviations: Step-through pattern Gait velocity: decreased     General Gait Details: Staggering gait at times, but is able to maintain balance.  Stairs            Wheelchair Mobility     Tilt Bed    Modified Rankin (Stroke Patients Only)       Balance Overall balance assessment: Needs assistance Sitting-balance support: Feet supported Sitting balance-Leahy Scale: Normal     Standing balance support: During functional activity Standing balance-Leahy Scale: Fair                               Pertinent Vitals/Pain Pain Assessment Pain Assessment: No/denies pain    Home Living Family/patient expects to be discharged to:: Private residence Living Arrangements: Alone Available Help at Discharge: Friend(s);Available PRN/intermittently Type of Home: House Home Access: Stairs to enter Entrance Stairs-Rails: Right Entrance Stairs-Number of Steps: 3   Home Layout: One level Home Equipment:  Cane - single point      Prior Function Prior Level of Function : Independent/Modified Independent             Mobility Comments: Pt reports she's been ambulating without AD after return home from STR . Pt reports 2 falls since being d/c from hospital ADLs Comments: Friend drives over & assists with cooking, cleaning, & transportation.     Hand Dominance   Dominant Hand: Right    Extremity/Trunk  Assessment   Upper Extremity Assessment Upper Extremity Assessment: Generalized weakness    Lower Extremity Assessment Lower Extremity Assessment: Generalized weakness       Communication      Cognition Arousal/Alertness: Awake/alert Behavior During Therapy: WFL for tasks assessed/performed Overall Cognitive Status: Within Functional Limits for tasks assessed                                          General Comments      Exercises     Assessment/Plan    PT Assessment Patient needs continued PT services  PT Problem List Decreased strength;Decreased activity tolerance;Decreased balance;Decreased mobility;Decreased knowledge of use of DME;Decreased safety awareness       PT Treatment Interventions DME instruction;Gait training;Stair training;Functional mobility training;Therapeutic activities;Therapeutic exercise;Balance training;Neuromuscular re-education    PT Goals (Current goals can be found in the Care Plan section)  Acute Rehab PT Goals Patient Stated Goal: to get better and go home PT Goal Formulation: With patient Time For Goal Achievement: 09/09/22    Frequency Min 2X/week     Co-evaluation               AM-PAC PT "6 Clicks" Mobility  Outcome Measure Help needed turning from your back to your side while in a flat bed without using bedrails?: A Little Help needed moving from lying on your back to sitting on the side of a flat bed without using bedrails?: A Little Help needed moving to and from a bed to a chair (including a wheelchair)?: A Little Help needed standing up from a chair using your arms (e.g., wheelchair or bedside chair)?: A Little Help needed to walk in hospital room?: A Little Help needed climbing 3-5 steps with a railing? : A Lot 6 Click Score: 17    End of Session Equipment Utilized During Treatment: Gait belt Activity Tolerance: Patient tolerated treatment well Patient left: in bed;with call bell/phone within  reach Nurse Communication: Mobility status PT Visit Diagnosis: Unsteadiness on feet (R26.81);Other abnormalities of gait and mobility (R26.89);Repeated falls (R29.6);Muscle weakness (generalized) (M62.81);History of falling (Z91.81);Difficulty in walking, not elsewhere classified (R26.2)    Time: 5284-1324 PT Time Calculation (min) (ACUTE ONLY): 12 min   Charges:   PT Evaluation $PT Eval Low Complexity: 1 Low   PT General Charges $$ ACUTE PT VISIT: 1 Visit          Nolon Bussing, PT, DPT Physical Therapist - [facility]  09/09/22, 4:41 PM

## 2022-09-09 NOTE — ED Notes (Signed)
Patient ambulatory to bathroom in room.

## 2022-09-10 NOTE — ED Provider Notes (Signed)
BP (!) 140/84   Pulse 81   Temp 98.4 F (36.9 C) (Oral)   Resp 16   Ht 5\' 2"  (1.575 m)   Wt 45.4 kg   SpO2 96%   BMI 18.29 kg/m   The patient is calm and cooperative at this time. There have been no acute events since the last update. Awaiting disposition plan from [facility] Fall team.    Willy Eddy, MD 09/10/22 [phone]

## 2022-09-10 NOTE — TOC Transition Note (Signed)
Transition of Care [facility]) - CM/SW Discharge Note   Patient Details  Name: Lynn Norton MRN: [id number] Date of Birth: [birth year]  [facility] CM/SW Contact:  Kreg Shropshire, RN Phone Number: 09/10/2022, 4:55 PM   Clinical Narrative:    Cm spoke with pt regarding HH PT. PT did evaluation and they recommended PT. Pt stated she has no preference of company. She lives alone but has help from family and friends who live next door. She has walker at home. Cm spoke with Winona Legato and he confirmed they will take her with PT. No further needs TOC signing off   Final next level of care: Home w Home Health Services Barriers to Discharge: Barriers Resolved   Patient Goals and CMS Choice   Choice offered to / list presented to : Patient  Discharge Placement                    Name of family member notified: Family Patient and family notified of of transfer: 09/10/22  Discharge Plan and Services Additional resources added to the After Visit Summary for                            [facility] Arranged: PT [facility] Agency: [facility] Date [facility] Agency Contacted: 09/10/22 Time HH Agency Contacted: 1605 Representative spoke with at [facility] Agency: Marnee Spring  Social Determinants of Health (SDOH) Interventions SDOH Screenings   Food Insecurity: No Food Insecurity (07/16/2022)   Received from [facility], [facility]  Transportation Needs: No Transportation Needs (07/16/2022)   Received from [facility], [facility]  Utilities: Not At Risk (07/16/2022)   Received from [facility], [facility]  Financial Resource Strain: Low Risk  (07/16/2022)   Received from [facility], [facility]  Tobacco Use: High Risk (07/16/2022)   Received from [facility], [facility]     Readmission Risk  Interventions     No data to display

## 2022-09-10 NOTE — ED Notes (Signed)
Pt with skin tear x 2 left forearm from previous fall per pt. Old dressing removed. Wound bed red, no s/s of infection. Dressing changed with xeroform, gauze, burn net.

## 2022-10-13 ENCOUNTER — Ambulatory Visit
Admission: RE | Admit: 2022-10-13 | Discharge: 2022-10-13 | Disposition: A | Discharge status: Home / Self Care | Payer: Medicare Other | Source: Ambulatory Visit | Attending: Acute Care | Admitting: Acute Care

## 2022-10-13 ENCOUNTER — Encounter: Payer: Medicare Other | Admitting: Acute Care

## 2022-10-13 ENCOUNTER — Ambulatory Visit: Admission: RE | Admit: 2022-10-13 | Payer: Medicare Other | Source: Ambulatory Visit

## 2022-10-13 ENCOUNTER — Ambulatory Visit (INDEPENDENT_AMBULATORY_CARE_PROVIDER_SITE_OTHER): Payer: Medicare Other | Admitting: Primary Care

## 2022-10-13 DIAGNOSIS — Z87891 Personal history of nicotine dependence: Secondary | ICD-10-CM | POA: Diagnosis present

## 2022-10-13 DIAGNOSIS — F17200 Nicotine dependence, unspecified, uncomplicated: Secondary | ICD-10-CM

## 2022-10-13 DIAGNOSIS — Z122 Encounter for screening for malignant neoplasm of respiratory organs: Secondary | ICD-10-CM | POA: Diagnosis present

## 2022-10-13 DIAGNOSIS — F17210 Nicotine dependence, cigarettes, uncomplicated: Secondary | ICD-10-CM | POA: Insufficient documentation

## 2022-10-13 NOTE — Progress Notes (Signed)
Virtual Visit via Telephone Note  I connected with Jerl Mina on 10/13/22 at 11:30 AM EDT by telephone and verified that I am speaking with the correct person using two identifiers.  Location: Patient: Home Provider: Office   I discussed the limitations, risks, security and privacy concerns of performing an evaluation and management service by telephone and the availability of in person appointments. I also discussed with the patient that there may be a patient responsible charge related to this service. The patient expressed understanding and agreed to proceed.     Shared Decision Making Visit Lung Cancer Screening Program [phone])   Eligibility: Age 72 y.o. Pack Years Smoking History Calculation 60 (# packs/per year x # years smoked) Recent History of coughing up blood  no Unexplained weight loss? no ( >Than 15 pounds within the last 6 months ) Prior History Lung / other cancer no (Diagnosis within the last 5 years already requiring surveillance chest CT Scans). Smoking Status Current Smoker Former Smokers: Years since quit: < 1 year  Quit Date: NA  Visit Components: Discussion included one or more decision making aids. yes Discussion included risk/benefits of screening. yes Discussion included potential follow up diagnostic testing for abnormal scans. yes Discussion included meaning and risk of over diagnosis. yes Discussion included meaning and risk of False Positives. yes Discussion included meaning of total radiation exposure. yes  Counseling Included: Importance of adherence to annual lung cancer LDCT screening. yes Impact of comorbidities on ability to participate in the program. yes Ability and willingness to under diagnostic treatment. yes  Smoking Cessation Counseling: Current Smokers:  Discussed importance of smoking cessation. yes Information about tobacco cessation classes and interventions provided to patient. yes Patient provided with "ticket" for  LDCT Scan. NA Symptomatic Patient. no  Counseling(Intermediate counseling: > three minutes) 99406 Diagnosis Code: Tobacco Use Z72.0 Asymptomatic Patient yes  Counseling (Intermediate counseling: > three minutes counseling) V4259 Former Smokers:  Discussed the importance of maintaining cigarette abstinence. yes Diagnosis Code: Personal History of Nicotine Dependence. D63.875 Information about tobacco cessation classes and interventions provided to patient. Yes Patient provided with "ticket" for LDCT Scan. NA Written Order for Lung Cancer Screening with LDCT placed in Epic. Yes (CT Chest Lung Cancer Screening Low Dose W/O CM) [id number] Z12.2-Screening of respiratory organs Z87.891-Personal history of nicotine dependence  I have spent 25 minutes of face to face/ virtual visit   time with Ms Torp discussing the risks and benefits of lung cancer screening. We viewed / discussed a power point together that explained in detail the above noted topics. We paused at intervals to allow for questions to be asked and answered to ensure understanding.We discussed that the single most powerful action that she can take to decrease her risk of developing lung cancer is to quit smoking. We discussed whether or not she is ready to commit to setting a quit date. We discussed options for tools to aid in quitting smoking including nicotine replacement therapy, non-nicotine medications, support groups, Quit Smart classes, and behavior modification. We discussed that often times setting smaller, more achievable goals, such as eliminating 1 cigarette a day for a week and then 2 cigarettes a day for a week can be helpful in slowly decreasing the number of cigarettes smoked. This allows for a sense of accomplishment as well as providing a clinical benefit. I provided  her  with smoking cessation  information  with contact information for community resources, classes, free nicotine replacement therapy, and access to mobile apps,  text messaging, and on-line smoking cessation help. I have also provided  her  the office contact information in the event she needs to contact me, or the screening staff. We discussed the time and location of the scan, and that either Abigail Miyamoto RN, Karlton Lemon, RN  or I will call / send a letter with the results within 24-72 hours of receiving them. The patient verbalized understanding of all of  the above and had no further questions upon leaving the office. They have my contact information in the event they have any further questions.  I spent 3-5 minutes counseling on smoking cessation and the health risks of continued tobacco abuse.  I explained to the patient that there has been a high incidence of coronary artery disease noted on these exams. I explained that this is a non-gated exam therefore degree or severity cannot be determined. This patient is not on statin therapy. I have asked the patient to follow-up with their PCP regarding any incidental finding of coronary artery disease and management with diet or medication as their PCP  feels is clinically indicated. The patient verbalized understanding of the above and had no further questions upon completion of the visit.   Glenford Bayley, NP

## 2022-10-13 NOTE — Patient Instructions (Signed)

## 2022-10-20 ENCOUNTER — Ambulatory Visit: Payer: Medicare Other

## 2022-10-21 ENCOUNTER — Telehealth: Payer: Self-pay | Admitting: Acute Care

## 2022-10-21 DIAGNOSIS — I2721 Secondary pulmonary arterial hypertension: Secondary | ICD-10-CM

## 2022-10-21 DIAGNOSIS — J9811 Atelectasis: Secondary | ICD-10-CM

## 2022-10-21 DIAGNOSIS — F17210 Nicotine dependence, cigarettes, uncomplicated: Secondary | ICD-10-CM

## 2022-10-21 DIAGNOSIS — Z87891 Personal history of nicotine dependence: Secondary | ICD-10-CM

## 2022-10-21 DIAGNOSIS — Z122 Encounter for screening for malignant neoplasm of respiratory organs: Secondary | ICD-10-CM

## 2022-10-21 NOTE — Telephone Encounter (Signed)
I have attempted to call the patient with the results of their  Low Dose CT Chest Lung cancer screening scan. There was no answer. I have left a HIPPA compliant VM requesting the patient call the office for the scan results. I included the office contact information in the message. We will await his return call. If no return call we will continue to call until patient is contacted.   Lynn Norton, Lynn Norton, patient needs a pulmonary referral to evaluate the collapse of the right middle lobe. There is no additional imaging to help determine if this is chronic or acute. CXR from 2023 does not show it, so may be acute.   Patient looks like she is managed by [facility], so we will need to refer to pulmonary there once we get in touch with her. She will need 12 month follow up for her LR 2 score. Please fax results to PCP Lynn let them know we give results to patient once we get in touch with her.  Thanks so much .

## 2022-10-21 NOTE — Telephone Encounter (Signed)
Returned call to patient. No answer. Left VM and call back number

## 2022-10-22 ENCOUNTER — Emergency Department: Payer: Medicare Other

## 2022-10-22 ENCOUNTER — Other Ambulatory Visit: Payer: Self-pay

## 2022-10-22 ENCOUNTER — Inpatient Hospital Stay
Admission: EM | Admit: 2022-10-22 | Discharge: 2022-10-29 | DRG: 511 | Disposition: A | Discharge status: Skilled Nursing Facility | Payer: Medicare Other | Attending: Internal Medicine | Admitting: Internal Medicine

## 2022-10-22 DIAGNOSIS — S52301A Unspecified fracture of shaft of right radius, initial encounter for closed fracture: Secondary | ICD-10-CM | POA: Diagnosis present

## 2022-10-22 DIAGNOSIS — Y900 Blood alcohol level of less than 20 mg/100 ml: Secondary | ICD-10-CM | POA: Diagnosis present

## 2022-10-22 DIAGNOSIS — W19XXXA Unspecified fall, initial encounter: Secondary | ICD-10-CM | POA: Diagnosis not present

## 2022-10-22 DIAGNOSIS — M069 Rheumatoid arthritis, unspecified: Secondary | ICD-10-CM | POA: Diagnosis present

## 2022-10-22 DIAGNOSIS — Z882 Allergy status to sulfonamides status: Secondary | ICD-10-CM

## 2022-10-22 DIAGNOSIS — F1020 Alcohol dependence, uncomplicated: Secondary | ICD-10-CM | POA: Diagnosis present

## 2022-10-22 DIAGNOSIS — Z888 Allergy status to other drugs, medicaments and biological substances status: Secondary | ICD-10-CM

## 2022-10-22 DIAGNOSIS — D631 Anemia in chronic kidney disease: Secondary | ICD-10-CM | POA: Diagnosis present

## 2022-10-22 DIAGNOSIS — E876 Hypokalemia: Secondary | ICD-10-CM | POA: Diagnosis present

## 2022-10-22 DIAGNOSIS — Z7952 Long term (current) use of systemic steroids: Secondary | ICD-10-CM

## 2022-10-22 DIAGNOSIS — F17210 Nicotine dependence, cigarettes, uncomplicated: Secondary | ICD-10-CM | POA: Diagnosis present

## 2022-10-22 DIAGNOSIS — F1010 Alcohol abuse, uncomplicated: Secondary | ICD-10-CM | POA: Insufficient documentation

## 2022-10-22 DIAGNOSIS — S52201A Unspecified fracture of shaft of right ulna, initial encounter for closed fracture: Principal | ICD-10-CM | POA: Diagnosis present

## 2022-10-22 DIAGNOSIS — I129 Hypertensive chronic kidney disease with stage 1 through stage 4 chronic kidney disease, or unspecified chronic kidney disease: Secondary | ICD-10-CM | POA: Diagnosis present

## 2022-10-22 DIAGNOSIS — S62101A Fracture of unspecified carpal bone, right wrist, initial encounter for closed fracture: Secondary | ICD-10-CM | POA: Diagnosis present

## 2022-10-22 DIAGNOSIS — E46 Unspecified protein-calorie malnutrition: Secondary | ICD-10-CM | POA: Diagnosis present

## 2022-10-22 DIAGNOSIS — Z681 Body mass index (BMI) 19 or less, adult: Secondary | ICD-10-CM

## 2022-10-22 DIAGNOSIS — E871 Hypo-osmolality and hyponatremia: Secondary | ICD-10-CM | POA: Diagnosis present

## 2022-10-22 DIAGNOSIS — Z79899 Other long term (current) drug therapy: Secondary | ICD-10-CM

## 2022-10-22 DIAGNOSIS — N184 Chronic kidney disease, stage 4 (severe): Secondary | ICD-10-CM | POA: Diagnosis present

## 2022-10-22 DIAGNOSIS — G9340 Encephalopathy, unspecified: Secondary | ICD-10-CM

## 2022-10-22 DIAGNOSIS — M19042 Primary osteoarthritis, left hand: Secondary | ICD-10-CM | POA: Diagnosis present

## 2022-10-22 DIAGNOSIS — Z880 Allergy status to penicillin: Secondary | ICD-10-CM

## 2022-10-22 DIAGNOSIS — R911 Solitary pulmonary nodule: Secondary | ICD-10-CM | POA: Diagnosis present

## 2022-10-22 DIAGNOSIS — S0101XA Laceration without foreign body of scalp, initial encounter: Secondary | ICD-10-CM | POA: Diagnosis present

## 2022-10-22 DIAGNOSIS — E860 Dehydration: Secondary | ICD-10-CM | POA: Diagnosis present

## 2022-10-22 DIAGNOSIS — R296 Repeated falls: Secondary | ICD-10-CM | POA: Diagnosis not present

## 2022-10-22 DIAGNOSIS — M19041 Primary osteoarthritis, right hand: Secondary | ICD-10-CM | POA: Diagnosis present

## 2022-10-22 DIAGNOSIS — G312 Degeneration of nervous system due to alcohol: Secondary | ICD-10-CM | POA: Diagnosis present

## 2022-10-22 DIAGNOSIS — Z5982 Transportation insecurity: Secondary | ICD-10-CM

## 2022-10-22 DIAGNOSIS — Z91199 Patient's noncompliance with other medical treatment and regimen due to unspecified reason: Secondary | ICD-10-CM

## 2022-10-22 DIAGNOSIS — Z8249 Family history of ischemic heart disease and other diseases of the circulatory system: Secondary | ICD-10-CM

## 2022-10-22 DIAGNOSIS — I10 Essential (primary) hypertension: Secondary | ICD-10-CM | POA: Diagnosis present

## 2022-10-22 DIAGNOSIS — Z23 Encounter for immunization: Secondary | ICD-10-CM

## 2022-10-22 DIAGNOSIS — Z881 Allergy status to other antibiotic agents status: Secondary | ICD-10-CM

## 2022-10-22 DIAGNOSIS — Y92009 Unspecified place in unspecified non-institutional (private) residence as the place of occurrence of the external cause: Secondary | ICD-10-CM

## 2022-10-22 LAB — CBC
HCT: 33.5 % — ABNORMAL LOW (ref 36.0–46.0)
Hemoglobin: 11.7 g/dL — ABNORMAL LOW (ref 12.0–15.0)
MCH: 34 pg (ref 26.0–34.0)
MCHC: 34.9 g/dL (ref 30.0–36.0)
MCV: 97.4 fL (ref 80.0–100.0)
Platelets: 272 10*3/uL (ref 150–400)
RBC: 3.44 MIL/uL — ABNORMAL LOW (ref 3.87–5.11)
RDW: 14.8 % (ref 11.5–15.5)
WBC: 14.4 10*3/uL — ABNORMAL HIGH (ref 4.0–10.5)
nRBC: 0 % (ref 0.0–0.2)

## 2022-10-22 LAB — COMPREHENSIVE METABOLIC PANEL
ALT: 25 U/L (ref 0–44)
AST: 47 U/L — ABNORMAL HIGH (ref 15–41)
Albumin: 3.7 g/dL (ref 3.5–5.0)
Alkaline Phosphatase: 45 U/L (ref 38–126)
Anion gap: 12 (ref 5–15)
BUN: 16 mg/dL (ref 8–23)
CO2: 24 mmol/L (ref 22–32)
Calcium: 8.6 mg/dL — ABNORMAL LOW (ref 8.9–10.3)
Chloride: 89 mmol/L — ABNORMAL LOW (ref 98–111)
Creatinine, Ser: 0.66 mg/dL (ref 0.44–1.00)
GFR, Estimated: >60 mL/min (ref >60)
Glucose, Bld: 82 mg/dL (ref 70–99)
Potassium: 3.8 mmol/L (ref 3.5–5.1)
Sodium: 125 mmol/L — ABNORMAL LOW (ref 135–145)
Total Bilirubin: 0.8 mg/dL (ref 0.3–1.2)
Total Protein: 6.4 g/dL — ABNORMAL LOW (ref 6.5–8.1)

## 2022-10-22 LAB — SODIUM, URINE, RANDOM: Sodium, Ur: 38 mmol/L

## 2022-10-22 LAB — ETHANOL: Alcohol, Ethyl (B): <10 mg/dL (ref <10)

## 2022-10-22 LAB — SODIUM
Sodium: 125 mmol/L — ABNORMAL LOW (ref 135–145)
Sodium: 126 mmol/L — ABNORMAL LOW (ref 135–145)
Sodium: 126 mmol/L — ABNORMAL LOW (ref 135–145)

## 2022-10-22 LAB — TROPONIN I (HIGH SENSITIVITY)
Troponin I (High Sensitivity): 14 ng/L (ref <18)
Troponin I (High Sensitivity): 13 ng/L (ref <18)

## 2022-10-22 LAB — OSMOLALITY, URINE: Osmolality, Ur: 306 mOsm/kg (ref 300–900)

## 2022-10-22 LAB — CK: Total CK: 515 U/L — ABNORMAL HIGH (ref 38–234)

## 2022-10-22 LAB — CREATININE, URINE, RANDOM: Creatinine, Urine: 32 mg/dL

## 2022-10-22 LAB — OSMOLALITY: Osmolality: 263 mOsm/kg — ABNORMAL LOW (ref 275–295)

## 2022-10-22 LAB — AMMONIA: Ammonia: <10 umol/L (ref 9–35)

## 2022-10-22 MED ORDER — TETANUS-DIPHTH-ACELL PERTUSSIS 5-2.5-18.5 LF-MCG/0.5 IM SUSY
0.5000 mL | PREFILLED_SYRINGE | Freq: Once | INTRAMUSCULAR | Status: AC
  Administered 2022-10-22: 0.5 mL via INTRAMUSCULAR
  Filled 2022-10-22: qty 0.5

## 2022-10-22 MED ORDER — THIAMINE MONONITRATE 100 MG PO TABS
100.0000 mg | ORAL_TABLET | Freq: Every day | ORAL | Status: DC
  Administered 2022-10-22 – 2022-10-29 (×8): 100 mg via ORAL
  Filled 2022-10-22 (×8): qty 1

## 2022-10-22 MED ORDER — MORPHINE SULFATE (PF) 4 MG/ML IV SOLN
4.0000 mg | INTRAVENOUS | Status: DC | PRN
  Administered 2022-10-22: 4 mg via INTRAVENOUS
  Filled 2022-10-22: qty 1

## 2022-10-22 MED ORDER — SODIUM CHLORIDE 0.9 % IV SOLN: Freq: Once | INTRAVENOUS | Status: AC

## 2022-10-22 MED ORDER — LIDOCAINE-EPINEPHRINE-TETRACAINE (LET) TOPICAL GEL
3.0000 mL | Freq: Once | TOPICAL | Status: AC
  Administered 2022-10-22: 3 mL via TOPICAL
  Filled 2022-10-22 (×2): qty 3

## 2022-10-22 MED ORDER — SODIUM CHLORIDE 0.9 % IV SOLN: INTRAVENOUS | Status: DC

## 2022-10-22 MED ORDER — ONDANSETRON HCL 4 MG PO TABS: 4.0000 mg | ORAL_TABLET | Freq: Four times a day (QID) | ORAL | Status: DC | PRN

## 2022-10-22 MED ORDER — SODIUM CHLORIDE 0.9 % IV BOLUS
500.0000 mL | Freq: Once | INTRAVENOUS | Status: AC
  Administered 2022-10-22: 500 mL via INTRAVENOUS

## 2022-10-22 MED ORDER — ADULT MULTIVITAMIN W/MINERALS CH
1.0000 | ORAL_TABLET | Freq: Every day | ORAL | Status: DC
  Administered 2022-10-22 – 2022-10-29 (×8): 1 via ORAL
  Filled 2022-10-22 (×8): qty 1

## 2022-10-22 MED ORDER — FOLIC ACID 1 MG PO TABS
1.0000 mg | ORAL_TABLET | Freq: Every day | ORAL | Status: DC
  Administered 2022-10-22 – 2022-10-29 (×8): 1 mg via ORAL
  Filled 2022-10-22 (×8): qty 1

## 2022-10-22 MED ORDER — LORAZEPAM 0.5 MG PO TABS: 0.5000 mg | ORAL_TABLET | ORAL | Status: DC | PRN

## 2022-10-22 MED ORDER — ONDANSETRON HCL 4 MG/2ML IJ SOLN: 4.0000 mg | Freq: Four times a day (QID) | INTRAMUSCULAR | Status: DC | PRN

## 2022-10-22 MED ORDER — ENOXAPARIN SODIUM 40 MG/0.4ML IJ SOSY
40.0000 mg | PREFILLED_SYRINGE | INTRAMUSCULAR | Status: DC
  Administered 2022-10-23: 40 mg via SUBCUTANEOUS
  Filled 2022-10-22: qty 0.4

## 2022-10-22 MED ORDER — THIAMINE HCL 100 MG/ML IJ SOLN: 100.0000 mg | Freq: Every day | INTRAMUSCULAR | Status: DC

## 2022-10-22 MED ORDER — LIDOCAINE-EPINEPHRINE-TETRACAINE (LET) TOPICAL GEL
3.0000 mL | Freq: Once | TOPICAL | Status: DC
Start: 2022-10-22 — End: 2022-10-22

## 2022-10-22 MED ORDER — LORAZEPAM 1 MG PO TABS: 1.0000 mg | ORAL_TABLET | ORAL | Status: DC | PRN

## 2022-10-22 NOTE — Assessment & Plan Note (Signed)
Creatinine 0.7 today with GFR greater than 60

## 2022-10-22 NOTE — ED Triage Notes (Addendum)
Pt presents to ED with c/o of fall due to ETOH. Pt also states possible fall also due to a cat issue. Pt is A&Ox4. Pt is from home. Pt has skin tears to R arm. Pt has matted blood in hair. Pt denies blood thinners. No LOC noted.   EMS states pt was on sidewalk waiting for them on their arrival.

## 2022-10-22 NOTE — Assessment & Plan Note (Signed)
Previously on methotrexate and prednisone  Unclear to degree of compliance

## 2022-10-22 NOTE — ED Provider Notes (Signed)
[facility] Provider Note    Event Date/Time   First MD Initiated Contact with Patient 10/22/22 [phone]     (approximate)   History   Fall   HPI  Lynn Norton is a 72 y.o. female with a history of CKD, hypertension, smoking history presents to the ER from with reported fall last night.  Does admit to alcohol use last night.  States that she may have gotten tripped over her cat.  Is complaining of posterior scalp discomfort.  As well as some right-sided rib pain.  Reportedly walked to the end of the street where EMS picked her up.     Physical Exam   Triage Vital Signs: ED Triage Vitals  Encounter Vitals Group     BP 10/22/22 0748 (!) 144/86     Systolic BP Percentile --      Diastolic BP Percentile --      Pulse Rate 10/22/22 0748 100     Resp 10/22/22 0748 18     Temp 10/22/22 0748 98.1 F (36.7 C)     Temp Source 10/22/22 0748 Oral     SpO2 10/22/22 0748 96 %     Weight 10/22/22 0749 86 lb (39 kg)     Height 10/22/22 0749 5\' 2"  (1.575 m)     Head Circumference --      Peak Flow --      Pain Score 10/22/22 0748 0     Pain Loc --      Pain Education --      Exclude from Growth Chart --     Most recent vital signs: Vitals:   10/22/22 0748  BP: (!) 144/86  Pulse: 100  Resp: 18  Temp: 98.1 F (36.7 C)  SpO2: 96%     Constitutional: Alert  Eyes: Conjunctivae are normal.  Head: 2 cm well-approximated laceration to the posterior scalp. Nose: No congestion/rhinnorhea. Mouth/Throat: Mucous membranes are moist.   Neck: Painless ROM.  Cardiovascular:   Good peripheral circulation. Respiratory: Normal respiratory effort.  No retractions.  Gastrointestinal: Soft and nontender.  Musculoskeletal:  no deformity Neurologic:  MAE spontaneously. No gross focal neurologic deficits are appreciated.  Skin:  Skin is warm, dry and intact. No rash noted. Psychiatric: Mood and affect are normal. Speech and behavior are normal.    ED Results /  Procedures / Treatments   Labs (all labs ordered are listed, but only abnormal results are displayed) Labs Reviewed  CBC - Abnormal; Notable for the following components:      Result Value   WBC 14.4 (*)    RBC 3.44 (*)    Hemoglobin 11.7 (*)    HCT 33.5 (*)    All other components within normal limits  COMPREHENSIVE METABOLIC PANEL - Abnormal; Notable for the following components:   Sodium 125 (*)    Chloride 89 (*)    Calcium 8.6 (*)    Total Protein 6.4 (*)    AST 47 (*)    All other components within normal limits  ETHANOL  TROPONIN I (HIGH SENSITIVITY)     EKG  ED ECG REPORT I, Willy Eddy, the attending physician, personally viewed and interpreted this ECG.   Date: 10/22/2022  EKG Time: 8:06  Rate: 80  Rhythm: sinus  Axis: normal  Intervals:irbbb  ST&T Change: no stemi, no depressions    RADIOLOGY Please see ED Course for my review and interpretation.  I personally reviewed all radiographic images ordered to evaluate for the  above acute complaints and reviewed radiology reports and findings.  These findings were personally discussed with the patient.  Please see medical record for radiology report.    PROCEDURES:  Critical Care performed: No  ..Laceration Repair  Date/Time: 10/22/2022 9:20 AM  Performed by: Willy Eddy, MD Authorized by: Willy Eddy, MD   Anesthesia:    Anesthesia method:  Topical application   Topical anesthetic:  LET Laceration details:    Location:  Scalp   Scalp location:  Occipital   Length (cm):  1.5 Treatment:    Area cleansed with:  Chlorhexidine   Amount of cleaning:  Standard Skin repair:    Repair method:  Staples   Number of staples:  3 Approximation:    Approximation:  Close Repair type:    Repair type:  Simple    MEDICATIONS ORDERED IN ED: Medications  sodium chloride 0.9 % bolus 500 mL (has no administration in time range)  0.9 %  sodium chloride infusion (has no administration in time  range)  Tdap (BOOSTRIX) injection 0.5 mL (0.5 mLs Intramuscular Given 10/22/22 0846)  lidocaine-EPINEPHrine-tetracaine (LET) topical gel (3 mLs Topical Given 10/22/22 0849)     IMPRESSION / MDM / ASSESSMENT AND PLAN / ED COURSE  I reviewed the triage vital signs and the nursing notes.                              Differential diagnosis includes, but is not limited to, dysrhythmia, alcohol intoxication, SDH, IPH, electrolyte abnormality, anemia    Patient presenting to the ER for evaluation of symptoms as described above.  Based on symptoms, risk factors and considered above differential, this presenting complaint could reflect a potentially life-threatening illness therefore the patient will be placed on continuous pulse oximetry and telemetry for monitoring.  Laboratory evaluation will be sent to evaluate for the above complaints.  CT head on my read on interpretation without evidence of SDH or IPH.  Patient's sodium is critically low to 125.  Likely secondary to alcohol ingestion.  Will consult hospitalist for admission given her falls weakness and hyponatremia.  Have ordered IV fluids.  Laceration repaired as above.  Patient agreeable with plan.    FINAL CLINICAL IMPRESSION(S) / ED DIAGNOSES   Final diagnoses:  Fall, initial encounter  Laceration of scalp, initial encounter  Hyponatremia     Rx / DC Orders   ED Discharge Orders     None        Note:  This document was prepared using Dragon voice recognition software and may include unintentional dictation errors.    Willy Eddy, MD 10/22/22 [phone]

## 2022-10-22 NOTE — Assessment & Plan Note (Signed)
BP stable Titrate home regimen 

## 2022-10-22 NOTE — Assessment & Plan Note (Signed)
Recurrent falls and generalized confusion on presentation Suspect multifactorial with contributions of hepatic versus toxic metabolic etiologies Noted concurrent alcohol use CT head within normal limits CIWA protocol Monitor

## 2022-10-22 NOTE — H&P (Signed)
History and Physical    Patient: Lynn Norton ZOX:[id number] DOB: 1950/08/03 DOA: 10/22/2022 DOS: the patient was seen and examined on 10/22/2022 PCP: Barbette Reichmann, MD  Patient coming from: Home  Chief Complaint:  Chief Complaint  Patient presents with   Fall   HPI: Lynn Norton is a 72 y.o. female with medical history significant of CKD, hypertension, rheumatoid arthritis, alcohol abuse presenting with fall, hyponatremia, alcohol abuse.  History primarily from patient's friend Darnelle Going ([id number]).  Per report, patient fell at home and was then found down outside of her house.  Per report, patient lives at home alone.  Ambulates with a walker.  He has longstanding history of alcohol abuse.  Someone who assist the patient brought her 2 large cartons of wine per her report.  When Rosanne Ashing found her, 1-1/2 cartons were empty.  Has had episodes like this in the past.  No reported fevers or chills.  No reported nausea or vomiting.  No reported chest pain or shortness of breath. Presented to the ER afebrile, hemodynamically stable.  Satting well on room air.  White count 14.4, hemoglobin 11.7, platelets 272, creatinine 0.7.  Alcohol level within normal limits.  Troponin negative x 1.  CT head, CT chest, CT C-spine grossly stable. Review of Systems: As mentioned in the history of present illness. All other systems reviewed and are negative. Past Medical History:  Diagnosis Date   Arthritis    CKD (chronic kidney disease), stage IV (HCC)    HTN (hypertension)    RA (rheumatoid arthritis) (HCC)    Tobacco abuse    Past Surgical History:  Procedure Laterality Date   ORIF ELBOW FRACTURE Right 04/27/2021   Procedure: OPEN REDUCTION INTERNAL FIXATION (ORIF) ELBOW/OLECRANON FRACTURE;  Surgeon: Kennedy Bucker, MD;  Location: [facility] ORS;  Service: Orthopedics;  Laterality: Right;   Social History:  reports that she has been smoking cigarettes. She has a 50 pack-year smoking history. She has never used  smokeless tobacco. She reports that she does not currently use alcohol. She reports that she does not use drugs.  Allergies  Allergen Reactions   Sulfa Antibiotics Other (See Comments) and Itching   Ceftriaxone Rash   Penicillins Rash and Other (See Comments)   Rocephin [Ceftriaxone] Hives    Spoke with patient. States she had hives that spread in response to rocephin   Sulfa Antibiotics    Penicillins Itching    Family History  Problem Relation Age of Onset   Heart attack Brother     Prior to Admission medications   Medication Sig Start Date End Date Taking? Authorizing Provider  albuterol (VENTOLIN HFA) 108 (90 Base) MCG/ACT inhaler Inhale 1-2 puffs into the lungs 4 (four) times daily as needed for wheezing or shortness of breath.    [provider]  amLODipine (NORVASC) 2.5 MG tablet Take 2.5 mg by mouth daily. (Take with 5mg  tablet to equal 7.5mg  total)    [provider]  amLODipine (NORVASC) 5 MG tablet Take 5 mg by mouth daily. 08/19/17   [provider]  doxycycline (VIBRA-TABS) 100 MG tablet Take 100 mg by mouth daily. 07/01/22 12/28/22  [provider]  feeding supplement (ENSURE ENLIVE / ENSURE PLUS) LIQD Take 237 mLs by mouth 2 (two) times daily between meals. 04/29/21   Enedina Finner, MD  folic acid (FOLVITE) 1 MG tablet Take 1 mg by mouth daily. 05/03/13   [provider]  glucosamine-chondroitin 500-400 MG tablet Take 1 tablet by mouth 2 (two) times daily.  [provider]  methotrexate (RHEUMATREX) 2.5 MG tablet Take 20 mg by mouth once a week. 08/16/22   [provider]  mirtazapine (REMERON) 7.5 MG tablet Take 7.5 mg by mouth at bedtime.    [provider]  Multiple Vitamins-Minerals (MULTIVITAMIN WITH MINERALS) tablet Take 1 tablet by mouth daily.    [provider]  nicotine (NICODERM CQ - DOSED IN MG/24 HOURS) 14 mg/24hr patch Place 1 patch (14 mg total) onto the skin daily. 04/29/21   Enedina Finner, MD  Omega-3 Fatty Acids (FISH OIL) 1200 MG CAPS Take 1 capsule by mouth daily.    [provider]  omeprazole (PRILOSEC OTC) 20 MG tablet Take 20 mg by mouth daily.    [provider]  PARoxetine (PAXIL) 10 MG tablet Take 10 mg by mouth daily.    [provider]  predniSONE (DELTASONE) 5 MG tablet Take 5 mg by mouth daily. 08/05/22   [provider]  traZODone (DESYREL) 50 MG tablet TAKE 1 TABLET BY MOUTH  NIGHTLY AS NEEDED FOR SLEEP 09/06/17   [provider]  Vitamin D, Ergocalciferol, (DRISDOL) 1.25 MG (50000 UNIT) CAPS capsule Take 50,000 Units by mouth every 7 (seven) days. 07/26/22   [provider]  zolpidem (AMBIEN) 10 MG tablet Take by mouth as needed. 08/19/17   [provider]    Physical Exam: Vitals:   10/22/22 0748 10/22/22 0749 10/22/22 0949 10/22/22 1222  BP: (!) 144/86  (!) 146/71 (!) 158/75  Pulse: 100  85 90  Resp: 18   18  Temp: 98.1 F (36.7 C)     TempSrc: Oral     SpO2: 96%  100% 92%  Weight:  39 kg    Height:  5\' 2"  (1.575 m)     Physical Exam Constitutional:      Comments: Underweight    HENT:     Head: Normocephalic and atraumatic.     Comments: + generalized bruising     Nose: Nose normal.     Mouth/Throat:     Mouth: Mucous membranes are moist.  Cardiovascular:     Rate and Rhythm: Normal rate and regular rhythm.  Pulmonary:     Effort: Pulmonary effort is normal.  Abdominal:     General: Bowel sounds are normal.  Musculoskeletal:     Comments: Noted bruising on right elbow and forearm area  Skin:    General: Skin is warm and dry.  Neurological:     Comments: Positive generalized confusion Otherwise grossly nonfocal neuroexam  Psychiatric:        Mood and Affect: Mood normal.     Data Reviewed:  There are no new results to review at this time.  CT Cervical Spine Wo Contrast CLINICAL DATA:  Trauma  EXAM: CT HEAD WITHOUT CONTRAST  CT CERVICAL SPINE WITHOUT  CONTRAST  TECHNIQUE: Multidetector CT imaging of the head and cervical spine was performed following the standard protocol without intravenous contrast. Multiplanar CT image reconstructions of the cervical spine were also generated.  RADIATION DOSE REDUCTION: This exam was performed according to the departmental dose-optimization program which includes automated exposure control, adjustment of the mA and/or kV according to patient size and/or use of iterative reconstruction technique.  COMPARISON:  CT head/cervical spine dated 09/08/2022  FINDINGS: CT HEAD FINDINGS  Brain: No evidence of acute infarction, hemorrhage, hydrocephalus, extra-axial collection or mass lesion/mass effect.  Global cortical atrophy. Subcortical white matter and periventricular small vessel ischemic changes.  Vascular: Mild intracranial  atherosclerosis.  Skull: Normal. Negative for fracture or focal lesion.  Sinuses/Orbits: The visualized paranasal sinuses are essentially clear. The mastoid air cells are unopacified.  Other: None.  CT CERVICAL SPINE FINDINGS  Alignment: Reversal of the normal mid cervical lordosis.  Skull base and vertebrae: No acute fracture. No primary bone lesion or focal pathologic process.  Soft tissues and spinal canal: No prevertebral fluid or swelling. No visible canal hematoma.  Disc levels: Mild degenerative changes of the mid cervical spine. Spinal canal is patent.  Upper chest: Evaluated on dedicated CT chest.  Other: None.  IMPRESSION: No acute intracranial abnormality. Atrophy with small vessel ischemic changes.  No traumatic injury to the cervical spine. Mild degenerative changes.  Electronically Signed   By: Charline Bills M.D.   On: 10/22/2022 08:39 CT HEAD WO CONTRAST ( ) CLINICAL DATA:  Trauma  EXAM: CT HEAD WITHOUT CONTRAST  CT CERVICAL SPINE WITHOUT CONTRAST  TECHNIQUE: Multidetector CT imaging of the head and cervical spine  was performed following the standard protocol without intravenous contrast. Multiplanar CT image reconstructions of the cervical spine were also generated.  RADIATION DOSE REDUCTION: This exam was performed according to the departmental dose-optimization program which includes automated exposure control, adjustment of the mA and/or kV according to patient size and/or use of iterative reconstruction technique.  COMPARISON:  CT head/cervical spine dated 09/08/2022  FINDINGS: CT HEAD FINDINGS  Brain: No evidence of acute infarction, hemorrhage, hydrocephalus, extra-axial collection or mass lesion/mass effect.  Global cortical atrophy. Subcortical white matter and periventricular small vessel ischemic changes.  Vascular: Mild intracranial atherosclerosis.  Skull: Normal. Negative for fracture or focal lesion.  Sinuses/Orbits: The visualized paranasal sinuses are essentially clear. The mastoid air cells are unopacified.  Other: None.  CT CERVICAL SPINE FINDINGS  Alignment: Reversal of the normal mid cervical lordosis.  Skull base and vertebrae: No acute fracture. No primary bone lesion or focal pathologic process.  Soft tissues and spinal canal: No prevertebral fluid or swelling. No visible canal hematoma.  Disc levels: Mild degenerative changes of the mid cervical spine. Spinal canal is patent.  Upper chest: Evaluated on dedicated CT chest.  Other: None.  IMPRESSION: No acute intracranial abnormality. Atrophy with small vessel ischemic changes.  No traumatic injury to the cervical spine. Mild degenerative changes.  Electronically Signed   By: Charline Bills M.D.   On: 10/22/2022 08:39 CT CHEST WO CONTRAST CLINICAL DATA:  Blunt chest trauma.  EXAM: CT CHEST WITHOUT CONTRAST  TECHNIQUE: Multidetector CT imaging of the chest was performed following the standard protocol without IV contrast.  RADIATION DOSE REDUCTION: This exam was performed according to  the departmental dose-optimization program which includes automated exposure control, adjustment of the mA and/or kV according to patient size and/or use of iterative reconstruction technique.  COMPARISON:  October 13, 2022.  FINDINGS: Cardiovascular: Atherosclerosis of thoracic aorta is noted without aneurysm formation. Normal cardiac size. No pericardial effusion.  Mediastinum/Nodes: No enlarged mediastinal or axillary lymph nodes. Thyroid gland, trachea, and esophagus demonstrate no significant findings.  Lungs/Pleura: No pneumothorax or pleural effusion is noted. Emphysematous disease is noted. Stable atelectasis of right middle lobe is noted. Stable 3 mm nodule is noted laterally in right upper lobe best seen on image number 51 of series 4.  Upper Abdomen: Nonobstructive right nephrolithiasis.  Musculoskeletal: No chest wall mass or suspicious bone lesions identified.  IMPRESSION: No definite traumatic injury seen in the chest.  Stable atelectasis of right middle lobe.  Nonobstructive right nephrolithiasis.  Stable 3  mm right upper lobe nodule. No follow-up needed if patient is low-risk.This recommendation follows the consensus statement: Guidelines for Management of Incidental Pulmonary Nodules Detected on CT Images: From the Fleischner Society 2017; Radiology 2017; 284:228-243.  Aortic Atherosclerosis (ICD10-I70.0) and Emphysema (ICD10-J43.9).  Electronically Signed   By: Lupita Raider M.D.   On: 10/22/2022 08:38  Lab Results  Component Value Date   WBC 14.4 (H) 10/22/2022   HGB 11.7 (L) 10/22/2022   HCT 33.5 (L) 10/22/2022   MCV 97.4 10/22/2022   PLT 272 10/22/2022   Last metabolic panel Lab Results  Component Value Date   GLUCOSE 82 10/22/2022   NA 125 (L) 10/22/2022   K 3.8 10/22/2022   CL 89 (L) 10/22/2022   CO2 24 10/22/2022   BUN 16 10/22/2022   CREATININE 0.66 10/22/2022   GFRNONAA >60 10/22/2022   CALCIUM 8.6 (L) 10/22/2022   PROT 6.4  (L) 10/22/2022   ALBUMIN 3.7 10/22/2022   LABGLOB 2.9 04/23/2021   AGRATIO 0.8 04/23/2021   BILITOT 0.8 10/22/2022   ALKPHOS 45 10/22/2022   AST 47 (H) 10/22/2022   ALT 25 10/22/2022   ANIONGAP 12 10/22/2022    Assessment and Plan: * Fall at home, initial encounter Recurrent falls in the setting of encephalopathy, alcohol abuse, hyponatremia, dehydration CT head grossly stable Noted generalized bruising PT OT evaluation Fall precautions Anticipate placement versus rehab  Encephalopathy Recurrent falls and generalized confusion on presentation Suspect multifactorial with contributions of hepatic versus toxic metabolic etiologies Noted concurrent alcohol use CT head within normal limits CIWA protocol Monitor  Rheumatoid arthritis (HCC) Previously on methotrexate and prednisone  Unclear to degree of compliance    Hyponatremia Sodium 125 on presentation Clinically dry with noted chronic alcohol use including wine Will place on gentle IV fluid hydration Trend sodium Check urine and serum studies Goal 6-8 mill equivalent change in sodium over the next 12 to 24 hours  HTN (hypertension) BP stable  Titrate home regimen  CKD (chronic kidney disease) stage 4, GFR 15-29 ml/min (HCC) Creatinine 0.7 today with GFR greater than 60  Alcohol abuse Recurrent alcohol abuse with noted binging of wine over the past 1 to 2 weeks per report Alcohol level within normal limits CIWA protocol Monitor    Greater than 50% was spent in counseling and coordination of care with patient Total encounter time 81 minutes or more     Advance Care Planning:   Code Status: Full Code   Consults: None   Family Communication: Discussed case at length with Darnelle Going over the phone.  Wishes to make patient full code.  His desire for patient to come home if possible.  Discussed at length that patient has a significant fall risk especially given encephalopathy and alcohol use.  Broached the issue  of placement versus possible rehab.  Severity of Illness: The appropriate patient status for this patient is INPATIENT. Inpatient status is judged to be reasonable and necessary in order to provide the required intensity of service to ensure the patient's safety. The patient's presenting symptoms, physical exam findings, and initial radiographic and laboratory data in the context of their chronic comorbidities is felt to place them at high risk for further clinical deterioration. Furthermore, it is not anticipated that the patient will be medically stable for discharge from the hospital within 2 midnights of admission.   * I certify that at the point of admission it is my clinical judgment that the patient will require inpatient hospital care spanning  beyond 2 midnights from the point of admission due to high intensity of service, high risk for further deterioration and high frequency of surveillance required.*  Author: Floydene Flock, MD 10/22/2022 12:40 PM  For on call review [email or web address].

## 2022-10-22 NOTE — Assessment & Plan Note (Signed)
Sodium 125 on presentation Clinically dry with noted chronic alcohol use including wine Will place on gentle IV fluid hydration Trend sodium Check urine and serum studies Goal 6-8 mill equivalent change in sodium over the next 12 to 24 hours

## 2022-10-22 NOTE — Assessment & Plan Note (Signed)
Recurrent falls in the setting of encephalopathy, alcohol abuse, hyponatremia, dehydration CT head grossly stable Noted generalized bruising PT OT evaluation Fall precautions Anticipate placement versus rehab

## 2022-10-22 NOTE — Assessment & Plan Note (Signed)
Recurrent alcohol abuse with noted binging of wine over the past 1 to 2 weeks per report Alcohol level within normal limits CIWA protocol Monitor

## 2022-10-23 DIAGNOSIS — F1010 Alcohol abuse, uncomplicated: Secondary | ICD-10-CM | POA: Diagnosis not present

## 2022-10-23 DIAGNOSIS — F1020 Alcohol dependence, uncomplicated: Secondary | ICD-10-CM | POA: Diagnosis present

## 2022-10-23 DIAGNOSIS — Z681 Body mass index (BMI) 19 or less, adult: Secondary | ICD-10-CM | POA: Diagnosis not present

## 2022-10-23 DIAGNOSIS — W19XXXA Unspecified fall, initial encounter: Secondary | ICD-10-CM | POA: Diagnosis present

## 2022-10-23 DIAGNOSIS — E871 Hypo-osmolality and hyponatremia: Secondary | ICD-10-CM | POA: Diagnosis present

## 2022-10-23 DIAGNOSIS — M069 Rheumatoid arthritis, unspecified: Secondary | ICD-10-CM | POA: Diagnosis present

## 2022-10-23 DIAGNOSIS — S52301A Unspecified fracture of shaft of right radius, initial encounter for closed fracture: Secondary | ICD-10-CM | POA: Diagnosis present

## 2022-10-23 DIAGNOSIS — E876 Hypokalemia: Secondary | ICD-10-CM | POA: Diagnosis present

## 2022-10-23 DIAGNOSIS — Y92009 Unspecified place in unspecified non-institutional (private) residence as the place of occurrence of the external cause: Secondary | ICD-10-CM | POA: Diagnosis not present

## 2022-10-23 DIAGNOSIS — G312 Degeneration of nervous system due to alcohol: Secondary | ICD-10-CM | POA: Diagnosis present

## 2022-10-23 DIAGNOSIS — S52201A Unspecified fracture of shaft of right ulna, initial encounter for closed fracture: Secondary | ICD-10-CM | POA: Diagnosis present

## 2022-10-23 DIAGNOSIS — S0101XA Laceration without foreign body of scalp, initial encounter: Secondary | ICD-10-CM | POA: Diagnosis present

## 2022-10-23 DIAGNOSIS — M19042 Primary osteoarthritis, left hand: Secondary | ICD-10-CM | POA: Diagnosis present

## 2022-10-23 DIAGNOSIS — Z5982 Transportation insecurity: Secondary | ICD-10-CM | POA: Diagnosis not present

## 2022-10-23 DIAGNOSIS — Z881 Allergy status to other antibiotic agents status: Secondary | ICD-10-CM | POA: Diagnosis not present

## 2022-10-23 DIAGNOSIS — E46 Unspecified protein-calorie malnutrition: Secondary | ICD-10-CM | POA: Diagnosis present

## 2022-10-23 DIAGNOSIS — D631 Anemia in chronic kidney disease: Secondary | ICD-10-CM | POA: Diagnosis present

## 2022-10-23 DIAGNOSIS — Z880 Allergy status to penicillin: Secondary | ICD-10-CM | POA: Diagnosis not present

## 2022-10-23 DIAGNOSIS — S52601A Unspecified fracture of lower end of right ulna, initial encounter for closed fracture: Secondary | ICD-10-CM | POA: Diagnosis not present

## 2022-10-23 DIAGNOSIS — Z23 Encounter for immunization: Secondary | ICD-10-CM | POA: Diagnosis not present

## 2022-10-23 DIAGNOSIS — Y900 Blood alcohol level of less than 20 mg/100 ml: Secondary | ICD-10-CM | POA: Diagnosis present

## 2022-10-23 DIAGNOSIS — M19041 Primary osteoarthritis, right hand: Secondary | ICD-10-CM | POA: Diagnosis present

## 2022-10-23 DIAGNOSIS — S62101A Fracture of unspecified carpal bone, right wrist, initial encounter for closed fracture: Secondary | ICD-10-CM | POA: Diagnosis present

## 2022-10-23 DIAGNOSIS — R296 Repeated falls: Secondary | ICD-10-CM | POA: Diagnosis present

## 2022-10-23 DIAGNOSIS — I10 Essential (primary) hypertension: Secondary | ICD-10-CM | POA: Diagnosis not present

## 2022-10-23 DIAGNOSIS — E860 Dehydration: Secondary | ICD-10-CM | POA: Diagnosis present

## 2022-10-23 DIAGNOSIS — Z8249 Family history of ischemic heart disease and other diseases of the circulatory system: Secondary | ICD-10-CM | POA: Diagnosis not present

## 2022-10-23 DIAGNOSIS — I129 Hypertensive chronic kidney disease with stage 1 through stage 4 chronic kidney disease, or unspecified chronic kidney disease: Secondary | ICD-10-CM | POA: Diagnosis present

## 2022-10-23 DIAGNOSIS — N184 Chronic kidney disease, stage 4 (severe): Secondary | ICD-10-CM | POA: Diagnosis present

## 2022-10-23 DIAGNOSIS — F17210 Nicotine dependence, cigarettes, uncomplicated: Secondary | ICD-10-CM | POA: Diagnosis present

## 2022-10-23 LAB — COMPREHENSIVE METABOLIC PANEL
ALT: 23 U/L (ref 0–44)
AST: 38 U/L (ref 15–41)
Albumin: 3.3 g/dL — ABNORMAL LOW (ref 3.5–5.0)
Alkaline Phosphatase: 47 U/L (ref 38–126)
Anion gap: 6 (ref 5–15)
BUN: 14 mg/dL (ref 8–23)
CO2: 26 mmol/L (ref 22–32)
Calcium: 8.7 mg/dL — ABNORMAL LOW (ref 8.9–10.3)
Chloride: 96 mmol/L — ABNORMAL LOW (ref 98–111)
Creatinine, Ser: 0.74 mg/dL (ref 0.44–1.00)
GFR, Estimated: >60 mL/min (ref >60)
Glucose, Bld: 92 mg/dL (ref 70–99)
Potassium: 3.8 mmol/L (ref 3.5–5.1)
Sodium: 128 mmol/L — ABNORMAL LOW (ref 135–145)
Total Bilirubin: 0.9 mg/dL (ref 0.3–1.2)
Total Protein: 6 g/dL — ABNORMAL LOW (ref 6.5–8.1)

## 2022-10-23 LAB — CBC
HCT: 34.6 % — ABNORMAL LOW (ref 36.0–46.0)
Hemoglobin: 12.1 g/dL (ref 12.0–15.0)
MCH: 33.7 pg (ref 26.0–34.0)
MCHC: 35 g/dL (ref 30.0–36.0)
MCV: 96.4 fL (ref 80.0–100.0)
Platelets: 281 10*3/uL (ref 150–400)
RBC: 3.59 MIL/uL — ABNORMAL LOW (ref 3.87–5.11)
RDW: 14.6 % (ref 11.5–15.5)
WBC: 10.9 10*3/uL — ABNORMAL HIGH (ref 4.0–10.5)
nRBC: 0 % (ref 0.0–0.2)

## 2022-10-23 LAB — SODIUM: Sodium: 127 mmol/L — ABNORMAL LOW (ref 135–145)

## 2022-10-23 MED ORDER — PREDNISONE 10 MG PO TABS
5.0000 mg | ORAL_TABLET | Freq: Every day | ORAL | Status: DC
  Administered 2022-10-23 – 2022-10-29 (×7): 5 mg via ORAL
  Filled 2022-10-23 (×7): qty 1

## 2022-10-23 MED ORDER — ALBUTEROL SULFATE (2.5 MG/3ML) 0.083% IN NEBU: 1.0000 mL | INHALATION_SOLUTION | Freq: Four times a day (QID) | RESPIRATORY_TRACT | Status: DC | PRN

## 2022-10-23 MED ORDER — LIDOCAINE-EPINEPHRINE-TETRACAINE (LET) TOPICAL GEL: 3.0000 mL | Freq: Once | TOPICAL | Status: DC

## 2022-10-23 MED ORDER — MIRTAZAPINE 15 MG PO TABS
7.5000 mg | ORAL_TABLET | Freq: Every day | ORAL | Status: DC
  Administered 2022-10-23 – 2022-10-28 (×6): 7.5 mg via ORAL
  Filled 2022-10-23 (×6): qty 1

## 2022-10-23 MED ORDER — OXYCODONE HCL 5 MG PO TABS: 5.0000 mg | ORAL_TABLET | Freq: Four times a day (QID) | ORAL | Status: DC | PRN

## 2022-10-23 MED ORDER — ENOXAPARIN SODIUM 30 MG/0.3ML IJ SOSY
30.0000 mg | PREFILLED_SYRINGE | INTRAMUSCULAR | Status: DC
  Administered 2022-10-24 – 2022-10-26 (×3): 30 mg via SUBCUTANEOUS
  Filled 2022-10-23 (×3): qty 0.3

## 2022-10-23 MED ORDER — PAROXETINE HCL 10 MG PO TABS
10.0000 mg | ORAL_TABLET | Freq: Every day | ORAL | Status: DC
  Administered 2022-10-23 – 2022-10-29 (×7): 10 mg via ORAL
  Filled 2022-10-23 (×8): qty 1

## 2022-10-23 MED ORDER — AMLODIPINE BESYLATE 5 MG PO TABS
5.0000 mg | ORAL_TABLET | Freq: Every day | ORAL | Status: DC
  Administered 2022-10-23: 5 mg via ORAL
  Filled 2022-10-23: qty 1

## 2022-10-23 MED ORDER — NICOTINE 21 MG/24HR TD PT24: 21.0000 mg | MEDICATED_PATCH | Freq: Every day | TRANSDERMAL | Status: DC

## 2022-10-23 MED ORDER — MORPHINE SULFATE (PF) 2 MG/ML IV SOLN
2.0000 mg | INTRAVENOUS | Status: DC | PRN
Start: 2022-10-23 — End: 2022-10-23

## 2022-10-23 MED ORDER — OXYCODONE HCL 5 MG PO TABS
5.0000 mg | ORAL_TABLET | Freq: Four times a day (QID) | ORAL | Status: DC | PRN
  Administered 2022-10-23 – 2022-10-25 (×2): 5 mg via ORAL
  Filled 2022-10-23 (×2): qty 1

## 2022-10-23 MED ORDER — ACETAMINOPHEN 325 MG PO TABS
650.0000 mg | ORAL_TABLET | Freq: Four times a day (QID) | ORAL | Status: DC | PRN
  Administered 2022-10-24 – 2022-10-27 (×6): 650 mg via ORAL
  Filled 2022-10-23 (×7): qty 2

## 2022-10-23 MED ORDER — NICOTINE 21 MG/24HR TD PT24
21.0000 mg | MEDICATED_PATCH | Freq: Every day | TRANSDERMAL | Status: DC
  Administered 2022-10-23 – 2022-10-29 (×7): 21 mg via TRANSDERMAL
  Filled 2022-10-23 (×7): qty 1

## 2022-10-23 NOTE — Progress Notes (Signed)
Lynn Norton  WUJ:[id number] DOB: 21-Dec-1950 DOA: 10/22/2022 PCP: Barbette Reichmann, MD    Brief Narrative:  72 year old with a history of CKD, HTN, RA, tobacco abuse, and alcohol abuse who presented to the ER 8/30 after being found down outside her house following what was felt to have been a mechanical fall.  The patient has a longstanding history of severe alcohol abuse, lives alone, and typically requires a walker to ambulate.  In the ER she was hemodynamically stable.  White count was elevated at 14.4.  CT head chest and C-spine were without acute abnormalities.  Goals of Care:   Code Status: Full Code   DVT prophylaxis: enoxaparin (LOVENOX) injection 40 mg Start: 10/23/22 1200  Interim Hx: Afebrile.  Vital signs stable.  Sodium trending upward from 125-128. The pt is alert and oriented this morning. She denies complaints, other than misc aches and pains. She denies drinking EtOH to a signif extent to me, claiming she has "only one drink every now and then." She lives alone in [place], and tells me she has "great neighbors" but no family in the area.   Assessment & Plan:  Mechanical fall Due to alcohol abuse, dehydration, and hyponatremia - CT head and neck without evidence of acute injury - PT/OT - suspect intoxication and overall deconditioning are the primary forces at play here   Encephalopathy of unclear etiology - generalized confusion Likely multifactorial and ultimately all likely related to alcohol abuse - CT head unrevealing -ammonia level normal - empiric thiamine dosing ongoing - check B12 and folate - her mental status has improved since admit and she is presently A&O x4   Alcohol abuse Chronic heavy alcohol use with reported binging over the last 1 to 2 weeks, though patient herself denies - alcohol level within normal limits at presentation, due to patient being down  Normocytic anemia No evidence of acute blood loss - likely due to chronic  alcoholism/malnutrition  Hyponatremia Sodium 125 at presentation with clinical evidence of volume depletion - likely some baseline hyponatremia due to severe alcohol abuse  RA Previously on methotrexate and prednisone - suspect patient is chronically noncompliant - no evidence of an acute flair at this time   HTN Monitor w/o change in tx at this time  CKD stage IV Creatinine 0.7 at presentation -creatinine actually better than reported baseline of 2.17  Stable 3 mm right upper lobe pulmonary nodule Appreciated incidentally on CT chest this admission and stable when compared to previous imaging - appears to have undergone w/u at [facility] w/ CT chest obtained 8/21 classifying the lesion as benign appearing with plan for ongoing annual re-evaluation   Incidentally noted total collapse of RML on CT chest 10/13/22 Noted to be "stable" on repeat CT chest at time of this admission - will need outpt Pulm evaluation (as was being planned per [facility])  Family Communication: no family present at time of exam  Disposition:  PT/OT suggesting SNF rehab stay - suspect pt will refuse and instead D/C home - potential for d/c 9/1 if she refuses SNF placement    Objective: Blood pressure (!) 143/61, pulse 78, temperature 97.9 F (36.6 C), resp. rate 19, height 5\' 2"  (1.575 m), weight 39 kg, SpO2 92%.  Intake/Output Summary (Last 24 hours) at 10/23/2022 0729 Last data filed at 10/22/2022 1944 Gross per 24 hour  Intake 367.2 ml  Output --  Net 367.2 ml   Filed Weights   10/22/22 0749  Weight: 39 kg  Examination: General: No acute respiratory distress Lungs: Clear to auscultation bilaterally without wheezes or crackles Cardiovascular: Regular rate and rhythm without murmur gallop or rub normal S1 and S2 Abdomen: Nontender, nondistended, soft, bowel sounds positive, no rebound, no ascites, no appreciable mass Extremities: No significant cyanosis, clubbing, or edema bilateral lower  extremities  CBC: Recent Labs  Lab 10/22/22 0752 10/23/22 0437  WBC 14.4* 10.9*  HGB 11.7* 12.1  HCT 33.5* 34.6*  MCV 97.4 96.4  PLT 272 281   Basic Metabolic Panel: Recent Labs  Lab 10/22/22 0752 10/22/22 1234 10/22/22 2021 10/23/22 0104 10/23/22 0437  NA 125*   < > 126* 127* 128*  K 3.8  --   --   --  3.8  CL 89*  --   --   --  96*  CO2 24  --   --   --  26  GLUCOSE 82  --   --   --  92  BUN 16  --   --   --  14  CREATININE 0.66  --   --   --  0.74  CALCIUM 8.6*  --   --   --  8.7*   < > = values in this interval not displayed.   GFR: Estimated Creatinine Clearance: 38 mL/min (by C-G formula based on SCr of 0.74 mg/dL).   Scheduled Meds:  enoxaparin (LOVENOX) injection  40 mg Subcutaneous Q24H   folic acid  1 mg Oral Daily   multivitamin with minerals  1 tablet Oral Daily   thiamine  100 mg Oral Daily   Or   thiamine  100 mg Intravenous Daily   Continuous Infusions:  sodium chloride 75 mL/hr at 10/23/22 0242     LOS: 0 days   Lonia Blood, MD [facility] Office  [phone] Pager - Text Page per Loretha Stapler  If 7PM-7AM, please contact night-coverage per Amion 10/23/2022, 7:29 AM

## 2022-10-23 NOTE — TOC Initial Note (Signed)
Transition of Care [facility]) - Initial/Assessment Note    Patient Details  Name: Lynn Norton MRN: [id number] Date of Birth: [birth year]  Transition of Care [facility]) CM/SW Contact:    Allena Katz, LCSW Phone Number: 10/23/2022, 3:36 PM  Clinical Narrative:    CSW spoke with pt regarding rehab. Pt reports she would like to go anywhere but [facility] as she did not like their food the last time she went. She would like the referral to be sent out to [facility]. She would like to clarify she did not trip over her cat Coco. She does not want any alcohol use resources at this time.               Expected Discharge Plan: Skilled Nursing Facility     Patient Goals and CMS Choice Patient states their goals for this hospitalization and ongoing recovery are:: go to rehab CMS Medicare.gov Compare Post Acute Care list provided to:: Patient Choice offered to / list presented to : Patient      Expected Discharge Plan and Services       Living arrangements for the past 2 months: Single Family Home                                      Prior Living Arrangements/Services Living arrangements for the past 2 months: Single Family Home Lives with:: Self   Do you feel safe going back to the place where you live?: Yes      Need for Family Participation in Patient Care: No (Comment) Care giver support system in place?: No (comment)   Criminal Activity/Legal Involvement Pertinent to Current Situation/Hospitalization: No - Comment as needed  Activities of Daily Living Home Assistive Devices/Equipment: Walker (specify type), Cane (specify quad or straight), Wheelchair ADL Screening (condition at time of admission) Patient's cognitive ability adequate to safely complete daily activities?: Yes Is the patient deaf or have difficulty hearing?: No Does the patient have difficulty seeing, even when wearing glasses/contacts?: No Does the patient have difficulty concentrating,  remembering, or making decisions?: No Patient able to express need for assistance with ADLs?: Yes Does the patient have difficulty dressing or bathing?: No Independently performs ADLs?: Yes (appropriate for developmental age) Does the patient have difficulty walking or climbing stairs?: Yes Weakness of Legs: Both Weakness of Arms/Hands: None  Permission Sought/Granted                  Emotional Assessment Appearance:: Well-Groomed   Affect (typically observed): Accepting Orientation: : Oriented to Self, Oriented to Place, Oriented to  Time, Oriented to Situation      Admission diagnosis:  Hyponatremia [E87.1] Fall [W19.XXXA] Fall, initial encounter [id number]] Laceration of scalp, initial encounter [S01.01XA] Acute hyponatremia [E87.1] Patient Active Problem List   Diagnosis Date Noted   Acute hyponatremia 10/23/2022   Fall at home, initial encounter 10/22/2022   Encephalopathy 10/22/2022   Alcohol abuse 10/22/2022   UTI (urinary tract infection) 06/13/2021   CKD (chronic kidney disease) stage 4, GFR 15-29 ml/min (HCC) 06/13/2021   Protein-calorie malnutrition, moderate (HCC) 06/13/2021   Closed fracture of pubic ramus-left  06/13/2021   Lower back pain 06/13/2021   HTN (hypertension) 04/26/2021   Rheumatoid arthritis (HCC) 04/24/2021   Tobacco use 04/24/2021   Bacteremia due to Escherichia coli 04/23/2021   Sepsis (HCC) 04/22/2021   Closed fracture dislocation of right elbow 04/22/2021   AKI (acute kidney  injury) (HCC) 04/22/2021   Hyponatremia 04/22/2021   Frequent falls 04/22/2021   Hypertension 03/16/2018   PAD (peripheral artery disease) (HCC) 03/16/2018   Tobacco use disorder 08/12/2014   Unspecified osteoarthritis, unspecified site 11/03/2011   PCP:  Barbette Reichmann, MD Pharmacy:   [facility]) - [place], [place] - [address] [facility] [address] [place] [address] [place]  [address] Phone: [phone] Fax:  [phone]  [id number] - [place], [place] - [address] [place] [address] Phone: [phone] Fax: [phone]     Social Determinants of Health (SDOH) Social History: SDOH Screenings   Food Insecurity: No Food Insecurity (10/22/2022)  Housing: Low Risk  (10/22/2022)  Transportation Needs: Unmet Transportation Needs (10/22/2022)  Utilities: Not At Risk (10/22/2022)  Financial Resource Strain: Low Risk  (10/12/2022)   Received from [facility]  Tobacco Use: High Risk (10/22/2022)   SDOH Interventions:     Readmission Risk Interventions     No data to display

## 2022-10-23 NOTE — NC FL2 (Signed)
[place] MEDICAID FL2 LEVEL OF CARE FORM     IDENTIFICATION  Patient Name: Lynn Norton Birthdate: 1950/10/15 Sex: female Admission Date (Current Location): 10/22/2022  [facility] and [place] Number:  Chiropodist and Address:  [facility], [address], [place], [place] [address]      Provider Number: [id number]  Attending Physician Name and Address:  Lonia Blood, MD  Relative Name and Phone Number:  Bailey Mech Clearence Cheek)  [phone]    Current Level of Care: Hospital Recommended Level of Care: Skilled Nursing Facility Prior Approval Number:    Date Approved/Denied:   PASRR Number: [id number]  Discharge Plan: SNF    Current Diagnoses: Patient Active Problem List   Diagnosis Date Noted   Acute hyponatremia 10/23/2022   Fall at home, initial encounter 10/22/2022   Encephalopathy 10/22/2022   Alcohol abuse 10/22/2022   UTI (urinary tract infection) 06/13/2021   CKD (chronic kidney disease) stage 4, GFR 15-29 ml/min (HCC) 06/13/2021   Protein-calorie malnutrition, moderate (HCC) 06/13/2021   Closed fracture of pubic ramus-left  06/13/2021   Lower back pain 06/13/2021   HTN (hypertension) 04/26/2021   Rheumatoid arthritis (HCC) 04/24/2021   Tobacco use 04/24/2021   Bacteremia due to Escherichia coli 04/23/2021   Sepsis (HCC) 04/22/2021   Closed fracture dislocation of right elbow 04/22/2021   AKI (acute kidney injury) (HCC) 04/22/2021   Hyponatremia 04/22/2021   Frequent falls 04/22/2021   Hypertension 03/16/2018   PAD (peripheral artery disease) (HCC) 03/16/2018   Tobacco use disorder 08/12/2014   Unspecified osteoarthritis, unspecified site 11/03/2011    Orientation RESPIRATION BLADDER Height & Weight     Self, Time, Situation, Place  Normal Continent Weight: 86 lb (39 kg) Height:  5\' 2"  (157.5 cm)  BEHAVIORAL SYMPTOMS/MOOD NEUROLOGICAL BOWEL NUTRITION STATUS      Continent    AMBULATORY STATUS COMMUNICATION  OF NEEDS Skin     Verbally Normal                       Personal Care Assistance Level of Assistance              Functional Limitations Info  Sight, Hearing, Speech Sight Info: Adequate Hearing Info: Adequate Speech Info: Adequate    SPECIAL CARE FACTORS FREQUENCY  PT (By licensed PT), OT (By licensed OT)     PT Frequency: 5 times a week OT Frequency: 5 times a week            Contractures Contractures Info: Not present    Additional Factors Info  Allergies   Allergies Info: Sulfa Antibiotics  Ceftriaxone  Penicillins  Rocephin (Ceftriaxone)  Sulfa Antibiotics  Penicillins           Current Medications (10/23/2022):  This is the current hospital active medication list Current Facility-Administered Medications  Medication Dose Route Frequency Provider Last Rate Last Admin   acetaminophen (TYLENOL) tablet 650 mg  650 mg Oral Q6H PRN Lonia Blood, MD       albuterol (PROVENTIL) (2.5 MG/3ML) 0.083% nebulizer solution 1-2 mL  1-2 mL Inhalation QID PRN Lonia Blood, MD       amLODipine (NORVASC) tablet 5 mg  5 mg Oral Daily Jetty Duhamel T, MD   5 mg at 10/23/22 0811   [START ON 10/24/2022] enoxaparin (LOVENOX) injection 30 mg  30 mg Subcutaneous Q24H Nazari, Walid A, RPH       folic acid (FOLVITE) tablet 1 mg  1 mg  Oral Daily Floydene Flock, MD   1 mg at 10/23/22 1610   mirtazapine (REMERON) tablet 7.5 mg  7.5 mg Oral QHS Lonia Blood, MD       multivitamin with minerals tablet 1 tablet  1 tablet Oral Daily Floydene Flock, MD   1 tablet at 10/23/22 9604   nicotine (NICODERM CQ - dosed in mg/24 hours) patch 21 mg  21 mg Transdermal Daily Jetty Duhamel T, MD   21 mg at 10/23/22 0812   ondansetron (ZOFRAN) tablet 4 mg  4 mg Oral Q6H PRN Floydene Flock, MD       Or   ondansetron [facility]) injection 4 mg  4 mg Intravenous Q6H PRN Floydene Flock, MD       oxyCODONE (Oxy IR/ROXICODONE) immediate release tablet 5-10 mg  5-10 mg Oral Q6H PRN  Lonia Blood, MD       PARoxetine (PAXIL) tablet 10 mg  10 mg Oral Daily Jetty Duhamel T, MD   10 mg at 10/23/22 5409   predniSONE (DELTASONE) tablet 5 mg  5 mg Oral Daily Lonia Blood, MD   5 mg at 10/23/22 8119   thiamine (VITAMIN B1) tablet 100 mg  100 mg Oral Daily Floydene Flock, MD   100 mg at 10/23/22 1478     Discharge Medications: Please see discharge summary for a list of discharge medications.  Relevant Imaging Results:  Relevant Lab Results:   Additional Information SS# [id number]  Allena Katz, LCSW

## 2022-10-23 NOTE — Evaluation (Signed)
Physical Therapy Evaluation Patient Details Name: Lynn Norton MRN: [id number] DOB: 1950-12-18 Today's Date: 10/23/2022  History of Present Illness  Pt admitted to [facility] on 10/22/22 under observation for c/o mechanical fall at home secondary to ETOH vs. Tripping over her cat. C/o posterior scalp discomfort with dried blood noted. Recent admission in July for similar complaints. Dx with recurrent falls in the setting of encephalopathy, alcohol abuse, hyponatremia, and dehydration. Significant PMH includes: CKD, hypertension, rheumatoid arthritis, alcohol abuse presenting with fall, hyponatremia, alcohol abuse. Imaging negative for acute infarct/fracture, but does note Nonobstructive right nephrolithiasis and Stable 3 mm right upper lobe nodule.   Clinical Impression  Pt is a 72 year old F admitted to hospital on 10/22/22 for fall at home. Pt questionable historian of health due to conflicting statements made during PT/OT evaluation. At baseline, pt reports being mod I with ADL's, IADL's, ambulation with SPC vs. RW, and driving.   Pt presents with generalized weakness, decreased gross balance, impaired skin integrity, increased pain levels, and decreased activity tolerance, resulting in impaired functional mobility from baseline. Due to deficits, pt required SBA-min assist for bed mobility, CGA for transfers with RW, and CGA to ambulate 52ft x2 (EOB<>bathroom) with RW. Increased unsteadiness noted during ambulation, which will increase her risk of falls. Generalized weakness, pain, and impaired skin integrity of BUE currently limiting safety, independence, and tolerance for UE tasks including: toileting, pericare, washing hands, dressing, bathing, etc.   Deficits limit the pt's ability to safely and independently perform ADL's, transfer, and ambulate. Pt will benefit from acute skilled PT services to address deficits for return to baseline function.     Pt will benefit from ambulating to/from bathroom for  toileting while in the hospital, to prevent further deconditioning.      If plan is discharge home, recommend the following: A little help with walking and/or transfers;A little help with bathing/dressing/bathroom;Assistance with cooking/housework;Direct supervision/assist for medications management;Assist for transportation;Help with stairs or ramp for entrance   Can travel by private vehicle   Yes    Equipment Recommendations None recommended by PT     Functional Status Assessment Patient has had a recent decline in their functional status and demonstrates the ability to make significant improvements in function in a reasonable and predictable amount of time.     Precautions / Restrictions Precautions Precautions: Fall Restrictions Weight Bearing Restrictions: No      Mobility  Bed Mobility Overal bed mobility: Needs Assistance Bed Mobility: Supine to Sit, Sit to Supine     Supine to sit: Min assist Sit to supine: Supervision   General bed mobility comments: Min assist for truncal support ot sit EOB, HOB slightly elevated, use of BUE for support. Able to be supervision to lie supine with HOB flat, with mulitmodal cues for sequencing/set up.    Transfers Overall transfer level: Needs assistance Equipment used: Rolling walker (2 wheels) Transfers: Sit to/from Stand Sit to Stand: Contact guard assist           General transfer comment: CGA for safety to stand from EOB and low height commode with RW. Multimodal cues for safety, sequencing, and hand placement. Increased difficulty with power to stand from low toilet height, requiring min assist for power to stand.    Ambulation/Gait Ambulation/Gait assistance: Contact guard assist Gait Distance (Feet): 40 Feet (8ft x2) Assistive device: Rolling walker (2 wheels)         General Gait Details: CGA for safeyt to ambulate to/from bathroom for voiding. Demonstrates increased  unsteadiness with quick cadence, veering,  and decreased step length/foot clearance bil. Multimodal cues for safety, sequencing, and RW proximity.    Balance Overall balance assessment: Needs assistance   Sitting balance-Leahy Scale: Good     Standing balance support: Bilateral upper extremity supported Standing balance-Leahy Scale: Fair Standing balance comment: in RW                             Pertinent Vitals/Pain Pain Assessment Pain Assessment: 0-10 Pain Score: 3  Pain Location: bil wrists Pain Descriptors / Indicators:  (unable to describe; denies pain feeling: sharp, dull, achey, throbbing, pins/needles) Pain Intervention(s): Limited activity within patient's tolerance, Monitored during session, Repositioned    Home Living Family/patient expects to be discharged to:: Private residence Living Arrangements: Alone Available Help at Discharge: Friend(s);Available PRN/intermittently Type of Home: House Home Access: Stairs to enter Entrance Stairs-Rails: Right Entrance Stairs-Number of Steps: 3   Home Layout: One level Home Equipment: Cane - single Librarian, academic (2 wheels);Cane - quad;Shower seat - built in;Grab bars - tub/shower      Prior Function Prior Level of Function : Independent/Modified Independent;History of Falls (last six months)             Mobility Comments: Pt reports she is Mod I for functional mobility using a cane or RW. One recent fall that led to current admission. ADLs Comments: Pt reports Mod I ADLs, friend assists with IADLs (cleaning, laundry, transportation).     Extremity/Trunk Assessment   Upper Extremity Assessment Upper Extremity Assessment: Defer to OT evaluation    Lower Extremity Assessment Lower Extremity Assessment: Generalized weakness (sensation grossly intact; strength grossly 3+/5)       Communication   Communication Communication: No apparent difficulties Cueing Techniques: Verbal cues  Cognition Arousal: Alert Behavior During Therapy: WFL  for tasks assessed/performed Overall Cognitive Status: No family/caregiver present to determine baseline cognitive functioning                                 General Comments: A&O x4 with inconsistent report of what happened with the fall (inconsistent between OT report)        General Comments General comments (skin integrity, edema, etc.): multiple areas of bruising to BUE from fall with increased edema to L elbow. Increased pain and impaired skin integrity limiting UE function with self care tasks (ie, toileting, pericare)    Exercises Other Exercises Other Exercises: Participates in bed mobility, transfers, gait, and toileting. Increased assist required for transfers and toileting ADL's due to BUE weakness.   Assessment/Plan    PT Assessment Patient needs continued PT services  PT Problem List Decreased strength;Decreased range of motion;Decreased activity tolerance;Decreased balance;Decreased mobility;Decreased safety awareness;Decreased skin integrity;Pain       PT Treatment Interventions DME instruction;Gait training;Stair training;Functional mobility training;Therapeutic activities;Therapeutic exercise;Balance training;Neuromuscular re-education;Patient/family education    PT Goals (Current goals can be found in the Care Plan section)  Acute Rehab PT Goals Patient Stated Goal: "get back to being myself" PT Goal Formulation: With patient Time For Goal Achievement: 11/06/22 Potential to Achieve Goals: Good    Frequency Min 1X/week        AM-PAC PT "6 Clicks" Mobility  Outcome Measure Help needed turning from your back to your side while in a flat bed without using bedrails?: None Help needed moving from lying on your back to sitting on the side  of a flat bed without using bedrails?: A Little Help needed moving to and from a bed to a chair (including a wheelchair)?: A Little Help needed standing up from a chair using your arms (e.g., wheelchair or bedside  chair)?: A Little Help needed to walk in hospital room?: A Little Help needed climbing 3-5 steps with a railing? : A Little 6 Click Score: 19    End of Session Equipment Utilized During Treatment: Gait belt Activity Tolerance: Patient tolerated treatment well Patient left: in bed;with call bell/phone within reach;with bed alarm set Nurse Communication: Mobility status PT Visit Diagnosis: Unsteadiness on feet (R26.81);Repeated falls (R29.6);Muscle weakness (generalized) (M62.81);Pain Pain - Right/Left:  (bilateral wrist)    Time: 1610-9604 PT Time Calculation (min) (ACUTE ONLY): 27 min   Charges:   PT Evaluation $PT Eval Low Complexity: 1 Low PT Treatments $Therapeutic Activity: 8-22 mins PT General Charges $$ ACUTE PT VISIT: 1 Visit        Vira Blanco, PT, DPT 12:52 PM,10/23/22 Physical Therapist - [place] [facility]

## 2022-10-23 NOTE — Evaluation (Signed)
Occupational Therapy Evaluation Patient Details Name: Lynn Norton MRN: [id number] DOB: October 01, 1950 Today's Date: 10/23/2022   History of Present Illness Lynn Norton is a 72 y.o. female with medical history significant of CKD, hypertension, rheumatoid arthritis, alcohol abuse presenting with fall, hyponatremia, and alcohol abuse.   Clinical Impression   Patient agreeable to OT evaluation. Pt presenting with decreased strength, endurance, balance, and safety awareness impacting performance in ADLs and functional mobility. PTA pt reports being Mod I for ADLs, receives assistance from friend for IADLs, and Mod I for functional mobility using a RW or cane. OT noted intermittent confusion during evaluation. Pt currently functioning at supervision for bed mobility, CGA for BSC transfer, and CGA to take steps from EOB<>BSC using RW. Pt deferred further mobility 2/2 fatigue. She c/o R rib pain, RN aware. Anticipate CGA-Min A for standing ADL tasks. Recommend skilled acute OT services to address deficits noted below. Pt would benefit from ongoing therapy upon discharge to maximize safety and independence with ADLs, functional mobility, and decrease fall risk.    If plan is discharge home, recommend the following: A little help with walking and/or transfers;A lot of help with bathing/dressing/bathroom;Assistance with cooking/housework;Assist for transportation;Help with stairs or ramp for entrance;Direct supervision/assist for medications management    Functional Status Assessment  Patient has had a recent decline in their functional status and demonstrates the ability to make significant improvements in function in a reasonable and predictable amount of time.  Equipment Recommendations  BSC/3in1    Recommendations for Other Services       Precautions / Restrictions Precautions Precautions: Fall Restrictions Weight Bearing Restrictions: No      Mobility Bed Mobility Overal bed mobility:  Needs Assistance Bed Mobility: Supine to Sit, Sit to Supine     Supine to sit: Supervision Sit to supine: Supervision        Transfers Overall transfer level: Needs assistance Equipment used: Rolling walker (2 wheels) Transfers: Sit to/from Stand Sit to Stand: Contact guard assist (STS from EOB and BSC)                  Balance Overall balance assessment: Needs assistance   Sitting balance-Leahy Scale: Good     Standing balance support: Bilateral upper extremity supported Standing balance-Leahy Scale: Fair           ADL either performed or assessed with clinical judgement   ADL Overall ADL's : Needs assistance/impaired   Eating/Feeding Details (indicate cue type and reason): Pt completing self-feeding upon arrival, anticipate pt may have difficulty with opening small containers/packets due to decreased hand strength and FMC.     Lower Body Dressing Details (indicate cue type and reason): anticipate Min A  Toilet Transfer: Contact guard assist;Ambulation;Rolling walker (2 wheels);BSC/3in1 Toilet Transfer Details (indicate cue type and reason): short ambulatory transfer   Toileting - Clothing Manipulation Details (indicate cue type and reason): anticipate Min A in standing, unable to void     Functional mobility during ADLs: Contact guard assist;Rolling walker (2 wheels) (Pt took several steps from EOB<>BSC with CGA + RW. Pt deferred further mobility 2/2 fatigue.)       Vision Baseline Vision/History: 1 Wears glasses Patient Visual Report: No change from baseline Vision Assessment?: No apparent visual deficits Additional Comments: Pt reports wearing glasses 24/7, did not have them on when she fell.     Perception         Praxis         Pertinent Vitals/Pain Pain Assessment Pain Assessment:  0-10 Pain Score: 5  Pain Location: R side rib pain Pain Descriptors / Indicators: Discomfort Pain Intervention(s): Limited activity within patient's  tolerance, Monitored during session, Repositioned     Extremity/Trunk Assessment Upper Extremity Assessment Upper Extremity Assessment: Generalized weakness   Lower Extremity Assessment Lower Extremity Assessment: Defer to PT evaluation       Communication Communication Communication: No apparent difficulties Cueing Techniques: Verbal cues   Cognition Arousal: Alert Behavior During Therapy: WFL for tasks assessed/performed Overall Cognitive Status: No family/caregiver present to determine baseline cognitive functioning       General Comments: Oriented to self, [place], and year. Pt looking at board in room for name of hospital. Intermittment confusion noted. Pt reported to MD in room that she does not remember how she fell, but was down for several hours. Pt then told OT that she fell on the stairs outside and did not trip over her cat.     General Comments  multiple bruises to BUE from fall    Exercises Other Exercises Other Exercises: OT provided education re: role of OT, OT POC, post acute recs, sitting up for all meals, EOB/OOB mobility with assistance, home/fall safety.     Shoulder Instructions      Home Living Family/patient expects to be discharged to:: Private residence Living Arrangements: Alone Available Help at Discharge: Friend(s);Available PRN/intermittently Type of Home: House Home Access: Stairs to enter [employer] of Steps: 3 Entrance Stairs-Rails: Right Home Layout: One level     Bathroom Shower/Tub: Producer, television/film/video: Standard     Home Equipment: Cane - single Librarian, academic (2 wheels);Cane - quad;Shower seat - built in          Prior Functioning/Environment Prior Level of Function : Independent/Modified Independent;History of Falls (last six months)             Mobility Comments: Pt reports she is Mod I for functional mobility using a cane or RW. One recent fall that led to current admission. ADLs  Comments: Pt reports Mod I ADLs, friend assists with IADLs (cleaning, laundry, transportation).        OT Problem List: Decreased strength;Decreased activity tolerance;Impaired balance (sitting and/or standing);Decreased safety awareness;Decreased knowledge of use of DME or AE;Pain      OT Treatment/Interventions: Self-care/ADL training;Therapeutic exercise;Energy conservation;DME and/or AE instruction;Therapeutic activities;Patient/family education;Balance training    OT Goals(Current goals can be found in the care plan section) Acute Rehab OT Goals Patient Stated Goal: none stated OT Goal Formulation: With patient Time For Goal Achievement: 11/06/22 Potential to Achieve Goals: Good ADL Goals Pt Will Perform Lower Body Dressing: sit to/from stand;with modified independence Pt Will Transfer to Toilet: with modified independence;ambulating Pt Will Perform Toileting - Clothing Manipulation and hygiene: with modified independence;sit to/from stand  OT Frequency: Min 1X/week    Co-evaluation              AM-PAC OT "6 Clicks" Daily Activity     Outcome Measure Help from another person eating meals?: A Little Help from another person taking care of personal grooming?: A Little Help from another person toileting, which includes using toliet, bedpan, or urinal?: A Little Help from another person bathing (including washing, rinsing, drying)?: A Lot Help from another person to put on and taking off regular upper body clothing?: A Little Help from another person to put on and taking off regular lower body clothing?: A Little 6 Click Score: 17   End of Session Equipment Utilized During Treatment: Rolling  walker (2 wheels);Gait belt Nurse Communication: Mobility status  Activity Tolerance: Patient tolerated treatment well;Patient limited by fatigue Patient left: in bed;with call bell/phone within reach;with bed alarm set  OT Visit Diagnosis: History of falling (Z91.81);Muscle weakness  (generalized) (M62.81);Unsteadiness on feet (R26.81)                Time: 2956-2130 OT Time Calculation (min): 23 min Charges:  OT General Charges $OT Visit: 1 Visit OT Evaluation $OT Eval Low Complexity: 1 Low  [facility] MS, OTR/L ascom [phone]  10/23/22, 12:30 PM

## 2022-10-24 DIAGNOSIS — Y92009 Unspecified place in unspecified non-institutional (private) residence as the place of occurrence of the external cause: Secondary | ICD-10-CM | POA: Diagnosis not present

## 2022-10-24 DIAGNOSIS — R296 Repeated falls: Secondary | ICD-10-CM | POA: Diagnosis not present

## 2022-10-24 DIAGNOSIS — M069 Rheumatoid arthritis, unspecified: Secondary | ICD-10-CM | POA: Diagnosis not present

## 2022-10-24 DIAGNOSIS — W19XXXA Unspecified fall, initial encounter: Secondary | ICD-10-CM | POA: Diagnosis not present

## 2022-10-24 LAB — RETICULOCYTES
Immature Retic Fract: 12.7 % (ref 2.3–15.9)
RBC.: 3.44 MIL/uL — ABNORMAL LOW (ref 3.87–5.11)
Retic Count, Absolute: 92.5 10*3/uL (ref 19.0–186.0)
Retic Ct Pct: 2.7 % (ref 0.4–3.1)

## 2022-10-24 LAB — CBC
HCT: 32.7 % — ABNORMAL LOW (ref 36.0–46.0)
Hemoglobin: 11.6 g/dL — ABNORMAL LOW (ref 12.0–15.0)
MCH: 33.7 pg (ref 26.0–34.0)
MCHC: 35.5 g/dL (ref 30.0–36.0)
MCV: 95.1 fL (ref 80.0–100.0)
Platelets: 260 10*3/uL (ref 150–400)
RBC: 3.44 MIL/uL — ABNORMAL LOW (ref 3.87–5.11)
RDW: 14.6 % (ref 11.5–15.5)
WBC: 11.5 10*3/uL — ABNORMAL HIGH (ref 4.0–10.5)
nRBC: 0 % (ref 0.0–0.2)

## 2022-10-24 LAB — COMPREHENSIVE METABOLIC PANEL
ALT: 21 U/L (ref 0–44)
AST: 29 U/L (ref 15–41)
Albumin: 3.3 g/dL — ABNORMAL LOW (ref 3.5–5.0)
Alkaline Phosphatase: 44 U/L (ref 38–126)
Anion gap: 7 (ref 5–15)
BUN: 14 mg/dL (ref 8–23)
CO2: 26 mmol/L (ref 22–32)
Calcium: 8.6 mg/dL — ABNORMAL LOW (ref 8.9–10.3)
Chloride: 96 mmol/L — ABNORMAL LOW (ref 98–111)
Creatinine, Ser: 0.68 mg/dL (ref 0.44–1.00)
GFR, Estimated: >60 mL/min (ref >60)
Glucose, Bld: 99 mg/dL (ref 70–99)
Potassium: 3.1 mmol/L — ABNORMAL LOW (ref 3.5–5.1)
Sodium: 129 mmol/L — ABNORMAL LOW (ref 135–145)
Total Bilirubin: 0.5 mg/dL (ref 0.3–1.2)
Total Protein: 5.8 g/dL — ABNORMAL LOW (ref 6.5–8.1)

## 2022-10-24 LAB — IRON AND TIBC
Iron: 35 ug/dL (ref 28–170)
Saturation Ratios: 15 % (ref 10.4–31.8)
TIBC: 235 ug/dL — ABNORMAL LOW (ref 250–450)
UIBC: 200 ug/dL

## 2022-10-24 LAB — FERRITIN: Ferritin: 67 ng/mL (ref 11–307)

## 2022-10-24 LAB — MAGNESIUM: Magnesium: 1.7 mg/dL (ref 1.7–2.4)

## 2022-10-24 LAB — VITAMIN B12: Vitamin B-12: 557 pg/mL (ref 180–914)

## 2022-10-24 LAB — FOLATE: Folate: 11.4 ng/mL (ref >5.9)

## 2022-10-24 MED ORDER — POTASSIUM CHLORIDE CRYS ER 20 MEQ PO TBCR
40.0000 meq | EXTENDED_RELEASE_TABLET | Freq: Two times a day (BID) | ORAL | Status: AC
  Administered 2022-10-24 – 2022-10-26 (×6): 40 meq via ORAL
  Filled 2022-10-24 (×6): qty 2

## 2022-10-24 MED ORDER — AMLODIPINE BESYLATE 10 MG PO TABS
10.0000 mg | ORAL_TABLET | Freq: Every day | ORAL | Status: DC
  Administered 2022-10-24 – 2022-10-29 (×6): 10 mg via ORAL
  Filled 2022-10-24 (×6): qty 1

## 2022-10-24 MED ORDER — ALUM & MAG HYDROXIDE-SIMETH 200-200-20 MG/5ML PO SUSP
30.0000 mL | ORAL | Status: DC | PRN
  Administered 2022-10-24: 30 mL via ORAL
  Filled 2022-10-24: qty 30

## 2022-10-24 MED ORDER — MAGNESIUM SULFATE 2 GM/50ML IV SOLN
2.0000 g | Freq: Once | INTRAVENOUS | Status: AC
  Administered 2022-10-24: 2 g via INTRAVENOUS
  Filled 2022-10-24: qty 50

## 2022-10-24 NOTE — TOC Progression Note (Signed)
Transition of Care [facility]) - Progression Note    Patient Details  Name: Lynn Norton MRN: [id number] Date of Birth: Mar 03, 1950  Transition [facility]) CM/SW Contact  Kemper Durie, RN Phone Number: 10/24/2022, 3:24 PM  Clinical Narrative:     Patient aware that [facility] has provided bed offer, others in the local area still pending. She would like to wait to see of she is offered a bed closer to home.  TOC will continue to follow.   Expected Discharge Plan: Skilled Nursing Facility    Expected Discharge Plan and Services       Living arrangements for the past 2 months: Single Family Home                                       Social Determinants of Health (SDOH) Interventions SDOH Screenings   Food Insecurity: No Food Insecurity (10/22/2022)  Housing: Low Risk  (10/22/2022)  Transportation Needs: Unmet Transportation Needs (10/22/2022)  Utilities: Not At Risk (10/22/2022)  Financial Resource Strain: Low Risk  (10/12/2022)   Received from [facility]  Tobacco Use: High Risk (10/22/2022)    Readmission Risk Interventions     No data to display

## 2022-10-24 NOTE — Progress Notes (Signed)
Lynn Norton  ZOX:[id number] DOB: Oct 04, 1950 DOA: 10/22/2022 PCP: Barbette Reichmann, MD    Brief Narrative:  72 year old with a history of CKD, HTN, RA, tobacco abuse, and alcohol abuse who presented to the ER 8/30 after being found down outside her house following what was felt to have been a mechanical fall.  The patient has a longstanding history of severe alcohol abuse, lives alone, and typically requires a walker to ambulate.  In the ER she was hemodynamically stable.  White count was elevated at 14.4.  CT head chest and C-spine were without acute abnormalities.  Goals of Care:   Code Status: Full Code   DVT prophylaxis: enoxaparin (LOVENOX) injection 30 mg Start: 10/24/22 1200  Interim Hx: No acute events reported overnight.  PT and OT have both suggested the patient would benefit from an SNF rehab stay and the patient has agreed.  She has been afebrile since admission.  Vital signs are stable.  Resting comfortably in bed.  Medically ready for discharge when facility bed available.  Assessment & Plan:  Mechanical fall Felt to be due to alcohol abuse, dehydration, and hyponatremia - CT head and neck without evidence of acute injury - PT/OT recommended SNF rehab stay- suspect intoxication and overall deconditioning were the primary forces at play here   Encephalopathy of unclear etiology - generalized confusion Likely multifactorial and ultimately all likely related to alcohol abuse - CT head unrevealing -ammonia level normal - empiric thiamine dosing ongoing - B12 557 - folate is normal- her mental status has improved since admit / she has rapidly returned to her baseline mental status  Alcohol abuse Chronic heavy alcohol use confirmed by friends with reported binging over the last 1 to 2 weeks, though patient herself denies - alcohol level within normal limits at presentation, due to patient being down -patient has been counseled to discontinue alcohol use  Normocytic anemia No  evidence of acute blood loss - likely due to chronic alcoholism/malnutrition -iron studies/low TIBC consistent with this  Hyponatremia Sodium 125 at presentation with clinical evidence of volume depletion - likely some baseline hyponatremia due to severe alcohol abuse -sodium steadily improving in absence of alcohol and with gentle volume expansion  Hypokalemia Likely due to poor nutrition -supplement and follow -magnesium is low normal so we will also supplement it empirically  RA Previously on methotrexate and prednisone - suspect patient is chronically noncompliant - no evidence of an acute flair at this time -continue baseline prednisone dose  HTN Blood pressure consistently elevated -adjust medical therapy and follow  Reported hx of CKD stage IV Creatinine 0.7 at presentation -creatinine actually better than reported baseline of 2.17, and stable within normal range  Stable 3 mm right upper lobe pulmonary nodule Appreciated incidentally on CT chest this admission and stable when compared to previous imaging - appears to have undergone w/u at [facility] w/ CT chest obtained 8/21 classifying the lesion as benign appearing with plan for ongoing annual re-evaluation   Incidentally noted total collapse of RML on CT chest 10/13/22 Noted to be "stable" on repeat CT chest at time of this admission - will need outpt Pulm evaluation (as was being planned per [facility])  Family Communication: no family present at time of exam  Disposition:  PT/OT suggesting SNF rehab stay -medically stable for discharge as soon as bed is available   Objective: Blood pressure (!) 154/75, pulse 88, temperature 98.2 F (36.8 C), resp. rate 19, height 5\' 2"  (1.575 m), weight 39  kg, SpO2 95%. No intake or output data in the 24 hours ending 10/24/22 0748  Filed Weights   10/22/22 0749  Weight: 39 kg    Examination: General: No acute respiratory distress Lungs: Clear to auscultation bilaterally - no  wheezing  Cardiovascular: RRR - no M or rub  Abdomen: NT/ND, soft, bs+, no mass  Extremities: No significant cyanosis, clubbing, or edema bilateral lower extremities  CBC: Recent Labs  Lab 10/22/22 0752 10/23/22 0437 10/24/22 0530  WBC 14.4* 10.9* 11.5*  HGB 11.7* 12.1 11.6*  HCT 33.5* 34.6* 32.7*  MCV 97.4 96.4 95.1  PLT 272 281 260   Basic Metabolic Panel: Recent Labs  Lab 10/22/22 0752 10/22/22 1234 10/23/22 0104 10/23/22 0437 10/24/22 0530  NA 125*   < > 127* 128* 129*  K 3.8  --   --  3.8 3.1*  CL 89*  --   --  96* 96*  CO2 24  --   --  26 26  GLUCOSE 82  --   --  92 99  BUN 16  --   --  14 14  CREATININE 0.66  --   --  0.74 0.68  CALCIUM 8.6*  --   --  8.7* 8.6*   < > = values in this interval not displayed.   GFR: Estimated Creatinine Clearance: 38 mL/min (by C-G formula based on SCr of 0.68 mg/dL).   Scheduled Meds:  amLODipine  5 mg Oral Daily   enoxaparin (LOVENOX) injection  30 mg Subcutaneous Q24H   folic acid  1 mg Oral Daily   mirtazapine  7.5 mg Oral QHS   multivitamin with minerals  1 tablet Oral Daily   nicotine  21 mg Transdermal Daily   PARoxetine  10 mg Oral Daily   predniSONE  5 mg Oral Daily   thiamine  100 mg Oral Daily      LOS: 1 day   Lonia Blood, MD [facility] Office  [phone] Pager - Text Page per Loretha Stapler  If 7PM-7AM, please contact night-coverage per Amion 10/24/2022, 7:48 AM

## 2022-10-25 DIAGNOSIS — W19XXXA Unspecified fall, initial encounter: Secondary | ICD-10-CM | POA: Diagnosis not present

## 2022-10-25 DIAGNOSIS — M069 Rheumatoid arthritis, unspecified: Secondary | ICD-10-CM | POA: Diagnosis not present

## 2022-10-25 DIAGNOSIS — I10 Essential (primary) hypertension: Secondary | ICD-10-CM | POA: Diagnosis not present

## 2022-10-25 DIAGNOSIS — E871 Hypo-osmolality and hyponatremia: Secondary | ICD-10-CM | POA: Diagnosis not present

## 2022-10-25 LAB — BASIC METABOLIC PANEL
Anion gap: 6 (ref 5–15)
BUN: 17 mg/dL (ref 8–23)
CO2: 26 mmol/L (ref 22–32)
Calcium: 8.4 mg/dL — ABNORMAL LOW (ref 8.9–10.3)
Chloride: 102 mmol/L (ref 98–111)
Creatinine, Ser: 0.67 mg/dL (ref 0.44–1.00)
GFR, Estimated: >60 mL/min (ref >60)
Glucose, Bld: 90 mg/dL (ref 70–99)
Potassium: 4.2 mmol/L (ref 3.5–5.1)
Sodium: 134 mmol/L — ABNORMAL LOW (ref 135–145)

## 2022-10-25 LAB — CBC
HCT: 32.7 % — ABNORMAL LOW (ref 36.0–46.0)
Hemoglobin: 11.1 g/dL — ABNORMAL LOW (ref 12.0–15.0)
MCH: 33.6 pg (ref 26.0–34.0)
MCHC: 33.9 g/dL (ref 30.0–36.0)
MCV: 99.1 fL (ref 80.0–100.0)
Platelets: 278 10*3/uL (ref 150–400)
RBC: 3.3 MIL/uL — ABNORMAL LOW (ref 3.87–5.11)
RDW: 14.9 % (ref 11.5–15.5)
WBC: 11.4 10*3/uL — ABNORMAL HIGH (ref 4.0–10.5)
nRBC: 0 % (ref 0.0–0.2)

## 2022-10-25 NOTE — Plan of Care (Signed)

## 2022-10-25 NOTE — Plan of Care (Signed)
Patient has complained of right wrist pain, pain being controlled by medication. Patient has been OOB to the [facility] tolerated well but unsteady.    Problem: Education: Goal: Knowledge of General Education information will improve Description: Including pain rating scale, medication(s)/side effects and non-pharmacologic comfort measures Outcome: Progressing   Problem: Health Behavior/Discharge Planning: Goal: Ability to manage health-related needs will improve Outcome: Progressing   Problem: Clinical Measurements: Goal: Ability to maintain clinical measurements within normal limits will improve Outcome: Progressing Goal: Will remain free from infection Outcome: Progressing Goal: Diagnostic test results will improve Outcome: Progressing Goal: Respiratory complications will improve Outcome: Progressing Goal: Cardiovascular complication will be avoided Outcome: Progressing   Problem: Activity: Goal: Risk for activity intolerance will decrease Outcome: Progressing   Problem: Nutrition: Goal: Adequate nutrition will be maintained Outcome: Progressing   Problem: Coping: Goal: Level of anxiety will decrease Outcome: Progressing   Problem: Elimination: Goal: Will not experience complications related to bowel motility Outcome: Progressing Goal: Will not experience complications related to urinary retention Outcome: Progressing   Problem: Pain Managment: Goal: General experience of comfort will improve Outcome: Progressing   Problem: Safety: Goal: Ability to remain free from injury will improve Outcome: Progressing   Problem: Safety: Goal: Ability to remain free from injury will improve Outcome: Progressing   Problem: Skin Integrity: Goal: Risk for impaired skin integrity will decrease Outcome: Progressing

## 2022-10-25 NOTE — Progress Notes (Signed)
[facility]  - [facility]   PATIENT NAME: Lynn Norton    MR#:  [id number]  DATE OF BIRTH:  [birth year]  SUBJECTIVE:  patient sitting up eating breakfast. No family at bedside. Overall improving. Denies any complaints. No issues per RN.    VITALS:  Blood pressure (!) 149/83, pulse 91, temperature 98.2 F (36.8 C), resp. rate 20, height 5\' 2"  (1.575 m), weight 39 kg, SpO2 90%.  PHYSICAL EXAMINATION:   GENERAL:  72 y.o.-year-old patient with no acute distress.  LUNGS: Normal breath sounds bilaterally, no wheezing CARDIOVASCULAR: S1, S2 normal. No murmur   ABDOMEN: Soft, nontender, nondistended. Bowel sounds present.  EXTREMITIES: No  edema b/l.    NEUROLOGIC: nonfocal  patient is alert and awake, deconditioned  LABORATORY PANEL:  CBC Recent Labs  Lab 10/25/22 0458  WBC 11.4*  HGB 11.1*  HCT 32.7*  PLT 278    Chemistries  Recent Labs  Lab 10/24/22 0528 10/24/22 0530 10/25/22 0458  NA  --  129* 134*  K  --  3.1* 4.2  CL  --  96* 102  CO2  --  26 26  GLUCOSE  --  99 90  BUN  --  14 17  CREATININE  --  0.68 0.67  CALCIUM  --  8.6* 8.4*  MG 1.7  --   --   AST  --  29  --   ALT  --  21  --   ALKPHOS  --  44  --   BILITOT  --  0.5  --     Assessment and Plan  72 year old with a history of CKD, HTN, RA, tobacco abuse, and alcohol abuse who presented to the ER 8/30 after being found down outside her house following what was felt to have been a mechanical fall. The patient has a longstanding history of severe alcohol abuse, lives alone, and typically requires a walker to ambulate. In the ER she was hemodynamically stable. White count was elevated at 14.4. CT head chest and C-spine were without acute abnormalities.   Mechanical fall Due to alcohol abuse, dehydration, and hyponatremia - CT head and neck without evidence of acute injury - PT/OT - suspect intoxication and overall deconditioning are the primary forces at play here  -- patient  overall slowly improving.  Encephalopathy of unclear etiology - generalized confusion --Likely multifactorial and ultimately all likely related to alcohol abuse -- CT head unrevealing -ammonia level normal - empiric thiamine dosing ongoing  --pt's mental status has improved since admit and she is presently A&O x4    Alcohol abuse Chronic heavy alcohol use with reported binging over the last 1 to 2 weeks, though patient herself denies - alcohol level within normal limits at presentation, due to patient being down -- patient advised on abstaining from alcohol   Normocytic anemia No evidence of acute blood loss - likely due to chronic alcoholism/malnutrition   Hyponatremia Sodium 125 at presentation with clinical evidence of volume depletion - likely some baseline hyponatremia due to severe alcohol abuse   RA Previously on methotrexate and prednisone - suspect patient is chronically noncompliant - no evidence of an acute flair at this time    HTN Cont amlodipine   CKD stage IV Creatinine 0.7 at presentation -creatinine actually better than reported baseline of 2.17   Stable 3 mm right upper lobe pulmonary nodule --Noted incidentally on CT chest this admission and stable when compared to previous imaging - appears to have undergone  w/u at [facility] w/ CT chest obtained 8/21 classifying the lesion as benign appearing with plan for ongoing annual re-evaluation   Patient medically best at baseline. Awaiting TOC for discharge planning.    Procedures: Family communication : none Consults : none CODE STATUS: full DVT Prophylaxis : enoxaparin Level of care: Med-Surg Status is: Inpatient Remains inpatient appropriate because: awaiting rehab bed    TOTAL TIME TAKING CARE OF THIS PATIENT: 35 minutes.  >50% time spent on counselling and coordination of care  Note: This dictation was prepared with Dragon dictation along with smaller phrase technology. Any transcriptional errors that  result from this process are unintentional.  Enedina Finner M.D    [facility]   CC: Primary care physician; Barbette Reichmann, MD

## 2022-10-26 ENCOUNTER — Inpatient Hospital Stay: Payer: Medicare Other

## 2022-10-26 DIAGNOSIS — M069 Rheumatoid arthritis, unspecified: Secondary | ICD-10-CM | POA: Diagnosis not present

## 2022-10-26 DIAGNOSIS — I10 Essential (primary) hypertension: Secondary | ICD-10-CM | POA: Diagnosis not present

## 2022-10-26 DIAGNOSIS — E871 Hypo-osmolality and hyponatremia: Secondary | ICD-10-CM | POA: Diagnosis not present

## 2022-10-26 DIAGNOSIS — W19XXXA Unspecified fall, initial encounter: Secondary | ICD-10-CM | POA: Diagnosis not present

## 2022-10-26 MED ORDER — CLINDAMYCIN PHOSPHATE 600 MG/50ML IV SOLN
600.0000 mg | Freq: Once | INTRAVENOUS | Status: AC
  Administered 2022-10-27: 600 mg via INTRAVENOUS

## 2022-10-26 MED ORDER — TRAMADOL HCL 50 MG PO TABS
50.0000 mg | ORAL_TABLET | Freq: Four times a day (QID) | ORAL | Status: DC | PRN
  Administered 2022-10-26 – 2022-10-29 (×3): 50 mg via ORAL
  Filled 2022-10-26 (×3): qty 1

## 2022-10-26 MED ORDER — HYDROCODONE-ACETAMINOPHEN 5-325 MG PO TABS: 1.0000 | ORAL_TABLET | Freq: Four times a day (QID) | ORAL | Status: DC | PRN

## 2022-10-26 MED ORDER — HYDROCODONE-ACETAMINOPHEN 5-325 MG PO TABS
1.0000 | ORAL_TABLET | Freq: Three times a day (TID) | ORAL | Status: DC | PRN
Start: 2022-10-26 — End: 2022-10-26

## 2022-10-26 NOTE — Care Management Important Message (Signed)
Important Message  Patient Details  Name: Lynn Norton MRN: [id number] Date of Birth: 09-May-1950   Medicare Important Message Given:  Yes     Johnell Comings 10/26/2022, 10:58 AM

## 2022-10-26 NOTE — TOC Progression Note (Signed)
Transition of Care [facility]) - Progression Note    Patient Details  Name: Lynn Norton MRN: [id number] Date of Birth: June 10, 1950  Transition of [facility]) CM/SW Contact  Garret Reddish, RN Phone Number: 10/26/2022, 3:58 PM  Clinical Narrative:   Bed offers presented to patient.  Patient informs me that she would like to go with [facility].  Patient reports that she will be having surgery in the am.  Noted that Orthopedics has been consulted to see patient.    TOC will continue to follow progress of patient.      Expected Discharge Plan: Skilled Nursing Facility    Expected Discharge Plan and Services       Living arrangements for the past 2 months: Single Family Home                                       Social Determinants of Health (SDOH) Interventions SDOH Screenings   Food Insecurity: No Food Insecurity (10/22/2022)  Housing: Low Risk  (10/22/2022)  Transportation Needs: Unmet Transportation Needs (10/22/2022)  Utilities: Not At Risk (10/22/2022)  Financial Resource Strain: Low Risk  (10/12/2022)   Received from [facility]  Tobacco Use: High Risk (10/22/2022)    Readmission Risk Interventions     No data to display

## 2022-10-26 NOTE — Progress Notes (Signed)
Physical Therapy Treatment Patient Details Name: Lynn Norton MRN: [id number] DOB: 06/01/1950 Today's Date: 10/26/2022   History of Present Illness Pt admitted to [facility] on 10/22/22 under observation for c/o mechanical fall at home secondary to ETOH vs. Tripping over her cat. C/o posterior scalp discomfort with dried blood noted. Recent admission in July for similar complaints. Dx with recurrent falls in the setting of encephalopathy, alcohol abuse, hyponatremia, and dehydration. Significant PMH includes: CKD, hypertension, rheumatoid arthritis, alcohol abuse presenting with fall, hyponatremia, alcohol abuse. Imaging negative for acute infarct/fracture, but does note Nonobstructive right nephrolithiasis and Stable 3 mm right upper lobe nodule.    PT Comments  Pt was pleasant and motivated to participate during the session and put forth good effort throughout. Pt required extra time and effort and min A for trunk control with bed mobility and required extra time and effort with transfers but no physical assist. Pt was generally steady with amb with no overt LOB but ambulated with slow cadence and short bilateral step length with poor activity tolerance.  Pt's max self-selected amb distance was 30 feet with min SOB at the end of the walk but with SpO2 and HR both WNL on room air.  Pt will benefit from continued PT services upon discharge to safely address deficits listed in patient problem list for decreased caregiver assistance and eventual return to PLOF.      If plan is discharge home, recommend the following: A little help with walking and/or transfers;A little help with bathing/dressing/bathroom;Assistance with cooking/housework;Direct supervision/assist for medications management;Assist for transportation;Help with stairs or ramp for entrance   Can travel by private vehicle     Yes  Equipment Recommendations  None recommended by PT    Recommendations for Other Services       Precautions /  Restrictions Precautions Precautions: Fall Restrictions Weight Bearing Restrictions: No     Mobility  Bed Mobility Overal bed mobility: Needs Assistance Bed Mobility: Supine to Sit     Supine to sit: Min assist     General bed mobility comments: Min A for trunk control during sup to sit    Transfers Overall transfer level: Needs assistance Equipment used: Rolling walker (2 wheels) Transfers: Sit to/from Stand Sit to Stand: Contact guard assist           General transfer comment: Min extra time and effort to come to standing but no physical assist needed    Ambulation/Gait Ambulation/Gait assistance: Contact guard assist Gait Distance (Feet): 20 Feet x 1, 30 Feet x 1 Assistive device: Rolling walker (2 wheels) Gait Pattern/deviations: Step-through pattern, Decreased step length - right, Decreased step length - left, Trunk flexed Gait velocity: decreased     General Gait Details: Min verbal cues for amb closer to the RW with upright posture   Stairs             Wheelchair Mobility     Tilt Bed    Modified Rankin (Stroke Patients Only)       Balance Overall balance assessment: Needs assistance   Sitting balance-Leahy Scale: Good     Standing balance support: Bilateral upper extremity supported, During functional activity, Reliant on assistive device for balance Standing balance-Leahy Scale: Fair                              Cognition Arousal: Alert Behavior During Therapy: WFL for tasks assessed/performed Overall Cognitive Status: Within Functional Limits for tasks assessed  Exercises Total Joint Exercises Ankle Circles/Pumps: AROM, Strengthening, Both, 10 reps Quad Sets: Strengthening, Both, 10 reps Gluteal Sets: Strengthening, Both, 10 reps Long Arc Quad: Strengthening, Both, 10 reps Knee Flexion: Strengthening, Both, 10 reps Other Exercises Other Exercises: HEP  education for BLE APs, QS, GS, and LAQs x 10 every each every 1-2 hours daily    General Comments        Pertinent Vitals/Pain Pain Assessment Pain Assessment: 0-10 Pain Score: 7  Pain Location: R wrist Pain Descriptors / Indicators: Aching, Sore Pain Intervention(s): Premedicated before session, Monitored during session, Patient requesting pain meds-RN notified    Home Living                          Prior Function            PT Goals (current goals can now be found in the care plan section) Progress towards PT goals: Progressing toward goals    Frequency    Min 1X/week      PT Plan      Co-evaluation              AM-PAC PT "6 Clicks" Mobility   Outcome Measure  Help needed turning from your back to your side while in a flat bed without using bedrails?: A Little Help needed moving from lying on your back to sitting on the side of a flat bed without using bedrails?: A Little Help needed moving to and from a bed to a chair (including a wheelchair)?: A Little Help needed standing up from a chair using your arms (e.g., wheelchair or bedside chair)?: A Little Help needed to walk in hospital room?: A Little Help needed climbing 3-5 steps with a railing? : A Lot 6 Click Score: 17    End of Session Equipment Utilized During Treatment: Gait belt Activity Tolerance: Patient tolerated treatment well Patient left: in chair;with call bell/phone within reach;with chair alarm set Nurse Communication: Mobility status PT Visit Diagnosis: Unsteadiness on feet (R26.81);Repeated falls (R29.6);Muscle weakness (generalized) (M62.81);Pain Pain - Right/Left: Right Pain - part of body: Hand     Time: 1140-1158 PT Time Calculation (min) (ACUTE ONLY): 18 min  Charges:    $Gait Training: 8-22 mins PT General Charges $$ ACUTE PT VISIT: 1 Visit                    D. Scott Lavera Vandermeer PT, DPT 10/26/22, 12:09 PM

## 2022-10-26 NOTE — Consult Note (Signed)
ORTHOPAEDIC CONSULTATION  REQUESTING PHYSICIAN: Enedina Finner, MD  Chief Complaint:   Right wrist pain.  History of Present Illness: Lynn Norton is a 72 y.o. female with a history of chronic kidney disease, hypertension, rheumatoid arthritis, ethanol abuse, and tobacco abuse who normally lives alone and ambulates with a walker.  The patient apparently was found down outside of her home on 10/22/2022.  There was a question of her having had too much to drink the night before.  She was brought to the emergency room and subsequently admitted for medical stabilization and possible detoxification.  While in the hospital, the patient was complaining of right wrist pain.  X-rays of the right forearm and hand were obtained this morning and demonstrated the presence of a displaced distal ulnar shaft fracture of the right forearm/wrist region.  The patient denies any numbness or paresthesias to her hand, and denies any prior injury to the right wrist.  She is status post an ORIF of a displaced olecranon fracture of her right elbow by Dr. Rosita Kea 18 months ago.  Past Medical History:  Diagnosis Date   Arthritis    CKD (chronic kidney disease), stage IV (HCC)    HTN (hypertension)    RA (rheumatoid arthritis) (HCC)    Tobacco abuse    Past Surgical History:  Procedure Laterality Date   ORIF ELBOW FRACTURE Right 04/27/2021   Procedure: OPEN REDUCTION INTERNAL FIXATION (ORIF) ELBOW/OLECRANON FRACTURE;  Surgeon: Kennedy Bucker, MD;  Location: [facility] ORS;  Service: Orthopedics;  Laterality: Right;   Social History   Socioeconomic History   Marital status: Widowed    Spouse name: Not on file   Number of children: Not on file   Years of education: Not on file   Highest education level: Not on file  Occupational History   Not on file  Tobacco Use   Smoking status: Every Day    Current packs/day: 1.00    Average packs/day: 1 pack/day for 50.0  years (50.0 ttl pk-yrs)    Types: Cigarettes   Smokeless tobacco: Never  Vaping Use   Vaping status: Never Used  Substance and Sexual Activity   Alcohol use: Not Currently   Drug use: Never   Sexual activity: Not on file  Other Topics Concern   Not on file  Social History Narrative   ** Merged History Encounter **       Social Determinants of Health   Financial Resource Strain: Low Risk  (10/12/2022)   Received from [facility]   Overall Financial Resource Strain (CARDIA)    Difficulty of Paying Living Expenses: Not hard at all  Food Insecurity: No Food Insecurity (10/22/2022)   Hunger Vital Sign    Worried About Running Out of Food in the Last Year: Never true    Ran Out of Food in the Last Year: Never true  Transportation Needs: Unmet Transportation Needs (10/22/2022)   PRAPARE - Administrator, Civil Service (Medical): Yes    Lack of Transportation (Non-Medical): Yes  Physical Activity: Not on file  Stress: Not on file  Social Connections: Not on file   Family History  Problem Relation Age of Onset   Heart attack Brother    Allergies  Allergen Reactions   Sulfa Antibiotics Other (See Comments) and Itching   Ceftriaxone Rash   Penicillins Rash and Other (See Comments)   Rocephin [Ceftriaxone] Hives    Spoke with patient. States she had hives that spread in response to rocephin  Sulfa Antibiotics    Penicillins Itching   Prior to Admission medications   Medication Sig Start Date End Date Taking? Authorizing Provider  albuterol (VENTOLIN HFA) 108 (90 Base) MCG/ACT inhaler Inhale 1-2 puffs into the lungs 4 (four) times daily as needed for wheezing or shortness of breath.   Yes [provider]  amLODipine (NORVASC) 2.5 MG tablet Take 2.5 mg by mouth daily.   Yes [provider]  folic acid (FOLVITE) 1 MG tablet Take 1 mg by mouth daily.   Yes [provider]  methotrexate (RHEUMATREX) 2.5 MG tablet Take 20 mg by  mouth every Thursday.   Yes [provider]  mirtazapine (REMERON) 7.5 MG tablet Take 7.5 mg by mouth at bedtime.   Yes [provider]  PARoxetine (PAXIL) 10 MG tablet Take 10 mg by mouth daily.   Yes [provider]  predniSONE (DELTASONE) 5 MG tablet Take 5 mg by mouth daily.   Yes [provider]   DG Forearm Right  Result Date: 10/26/2022 CLINICAL DATA:  Right wrist pain after recent fall. EXAM: RIGHT FOREARM - 2 VIEW COMPARISON:  None Available. FINDINGS: Oblique, mildly displaced and angulated fracture of the distal right ulnar diaphysis demonstrates up to approximately half of a shaft width displacement on the lateral view. Previously placed screw through the olecranon process of the proximal ulna and extending into the proximal diaphysis. IMPRESSION: Oblique, mildly displaced and angulated fracture of the distal right ulnar diaphysis. Prior ORIF of the proximal right ulna. Electronically Signed   By: Irish Lack M.D.   On: 10/26/2022 15:36   DG Hand 2 View Right  Result Date: 10/26/2022 CLINICAL DATA:  Right wrist pain after recent fall. EXAM: RIGHT HAND - 2 VIEW COMPARISON:  None Available. FINDINGS: Mildly displaced and oblique fracture of the distal ulnar diaphysis is seen at the bottom of the film. No other acute fractures identified. Moderately advanced arthritis involving all MCP joints, the first CMC joint, second through fifth DIP joints and the radiocarpal joint. IMPRESSION: 1. Mildly displaced and oblique fracture of the right distal ulnar diaphysis. 2. 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 Electronically Signed   By: Irish Lack M.D.   On: 10/26/2022 15:33    Positive ROS: All other systems have been reviewed and were otherwise negative with the exception of those mentioned in the HPI and as above.  Physical Exam: General:  Alert, no acute distress Psychiatric:  Patient is competent for consent with normal mood and  affect   Cardiovascular:  No pedal edema Respiratory:  No wheezing, non-labored breathing GI:  Abdomen is soft and non-tender Skin:  No lesions in the area of chief complaint Neurologic:  Sensation intact distally Lymphatic:  No axillary or cervical lymphadenopathy  Orthopedic Exam:  Orthopedic examination is limited to the right forearm and hand.  Skin inspection of the right forearm is notable for an area of abrasion over the proximal ulnar forearm region.  There also is mild swelling over the more distal ulnar forearm region.  She has moderate tenderness palpation over this area which coincides with the site of the fracture.  She is able actively flex and extend all digits, although motion is somewhat decreased due to pain and apprehension.  She also is able to actively flex and extend her wrist, although again wrist motion is somewhat limited due to pain and apprehension.  She is grossly neurovascularly intact to her right forearm and hand.  X-rays:  X-rays of the right forearm and hand are available for review and have been reviewed by myself.  The findings are as described above.  Assessment: Closed displaced right distal radial shaft fracture.  Plan: The treatment options, including both surgical and nonsurgical choices, have been discussed in detail with the patient and her family.  The patient would like to proceed with surgical intervention to include an open reduction and internal fixation of her right distal shaft fracture.  The risks (including bleeding, infection, nerve and/or blood vessel injury, persistent or recurrent pain, loosening or failure of the components, malunion and/or nonunion, stiffness of the wrist, limited pronation/supination, need for further surgery, blood clots, strokes, heart attacks or arrhythmias, pneumonia, etc.) and benefits of the surgical procedure were discussed.  The patient states her understanding and agrees to proceed.  A formal written consent will be  obtained by the nursing staff.  This procedure will be scheduled for tomorrow afternoon.  Thank you for asking me to participate in the care of this most pleasant yet unfortunate woman.  I will be happy to follow her with you.   Maryagnes Amos, MD  Beeper #:  [phone]  10/26/2022 3:43 PM

## 2022-10-26 NOTE — Progress Notes (Signed)
[facility]  - [place] [facility]   PATIENT NAME: Lynn Norton    MR#:  [id number]  DATE OF BIRTH:  1950-02-25  SUBJECTIVE:  patient sitting up eating breakfast. Complaining of right hand pain. Using her left hand 2 feet breakfast.   VITALS:  Blood pressure (!) 146/74, pulse 91, temperature 98.3 F (36.8 C), resp. rate (!) 24, height 5\' 2"  (1.575 m), weight 39 kg, SpO2 90%.  PHYSICAL EXAMINATION:   GENERAL:  72 y.o.-year-old patient with no acute distress.  LUNGS: Normal breath sounds bilaterally, no wheezing CARDIOVASCULAR: S1, S2 normal. No murmur   ABDOMEN: Soft, nontender, nondistended. Bowel sounds present.  EXTREMITIES: No  edema b/l.   Severe DJD both hands. Pain and difficulty moving right arm. NEUROLOGIC: nonfocal  patient is alert and awake, deconditioned  LABORATORY PANEL:  CBC Recent Labs  Lab 10/25/22 0458  WBC 11.4*  HGB 11.1*  HCT 32.7*  PLT 278    Chemistries  Recent Labs  Lab 10/24/22 0528 10/24/22 0530 10/25/22 0458  NA  --  129* 134*  K  --  3.1* 4.2  CL  --  96* 102  CO2  --  26 26  GLUCOSE  --  99 90  BUN  --  14 17  CREATININE  --  0.68 0.67  CALCIUM  --  8.6* 8.4*  MG 1.7  --   --   AST  --  29  --   ALT  --  21  --   ALKPHOS  --  44  --   BILITOT  --  0.5  --     Assessment and Plan  72 year old with a history of CKD, HTN, RA, tobacco abuse, and alcohol abuse who presented to the ER 8/30 after being found down outside her house following what was felt to have been a mechanical fall. The patient has a longstanding history of severe alcohol abuse, lives alone, and typically requires a walker to ambulate. In the ER she was hemodynamically stable. White count was elevated at 14.4. CT head chest and C-spine were without acute abnormalities.   Mechanical fall Due to alcohol abuse, dehydration, and hyponatremia - CT head and neck without evidence of acute injury - PT/OT - suspect intoxication and overall  deconditioning are the primary forces at play here  -- patient overall slowly improving.  Encephalopathy of unclear etiology - generalized confusion --Likely multifactorial and ultimately all likely related to alcohol abuse -- CT head unrevealing -ammonia level normal - empiric thiamine dosing ongoing  --pt's mental status has improved since admit and she is presently A&O x4   Right hand/forearm pain with h/o fall PTA -- x-ray right forearm shows distal ulnar fracture -- orthopedic consultation with Dr. Joice Lofts   Alcohol abuse Chronic heavy alcohol use with reported binging over the last 1 to 2 weeks, though patient herself denies - alcohol level within normal limits at presentation, due to patient being down -- patient advised on abstaining from alcohol   Normocytic anemia No evidence of acute blood loss - likely due to chronic alcoholism/malnutrition   Hyponatremia Sodium 125 at presentation with clinical evidence of volume depletion - likely some baseline hyponatremia due to severe alcohol abuse   RA Previously on methotrexate and prednisone - suspect patient is chronically noncompliant - no evidence of an acute flair at this time    HTN Cont amlodipine   CKD stage IV Creatinine 0.7 at presentation -creatinine actually better than reported baseline of  2.17   Stable 3 mm right upper lobe pulmonary nodule --Noted incidentally on CT chest this admission and stable when compared to previous imaging - appears to have undergone w/u at [facility] w/ CT chest obtained 8/21 classifying the lesion as benign appearing with plan for ongoing annual re-evaluation   Patient medically best at baseline. Awaiting TOC for discharge planning and Dr Poggi's input regarding fracture    Procedures: Family communication : none Consults : none CODE STATUS: full DVT Prophylaxis : enoxaparin Level of care: Med-Surg Status is: Inpatient Remains inpatient appropriate because: awaiting rehab bed     TOTAL TIME TAKING CARE OF THIS PATIENT: 35 minutes.  >50% time spent on counselling and coordination of care  Note: This dictation was prepared with Dragon dictation along with smaller phrase technology. Any transcriptional errors that result from this process are unintentional.  Enedina Finner M.D    [facility]   CC: Primary care physician; Barbette Reichmann, MD

## 2022-10-27 ENCOUNTER — Inpatient Hospital Stay: Payer: Medicare Other | Admitting: Anesthesiology

## 2022-10-27 ENCOUNTER — Inpatient Hospital Stay: Payer: Medicare Other

## 2022-10-27 ENCOUNTER — Encounter: Payer: Self-pay | Admitting: Internal Medicine

## 2022-10-27 ENCOUNTER — Encounter
Admission: EM | Disposition: A | Discharge status: Skilled Nursing Facility | Payer: Self-pay | Source: Home / Self Care | Attending: Internal Medicine

## 2022-10-27 ENCOUNTER — Other Ambulatory Visit: Payer: Self-pay

## 2022-10-27 DIAGNOSIS — S52601A Unspecified fracture of lower end of right ulna, initial encounter for closed fracture: Secondary | ICD-10-CM

## 2022-10-27 DIAGNOSIS — M069 Rheumatoid arthritis, unspecified: Secondary | ICD-10-CM | POA: Diagnosis not present

## 2022-10-27 DIAGNOSIS — I10 Essential (primary) hypertension: Secondary | ICD-10-CM | POA: Diagnosis not present

## 2022-10-27 DIAGNOSIS — F1010 Alcohol abuse, uncomplicated: Secondary | ICD-10-CM | POA: Diagnosis not present

## 2022-10-27 HISTORY — PX: ORIF WRIST FRACTURE: SHX2133

## 2022-10-27 SURGERY — OPEN REDUCTION INTERNAL FIXATION (ORIF) WRIST FRACTURE
Anesthesia: General | Site: Wrist | Laterality: Right

## 2022-10-27 MED ORDER — DEXAMETHASONE SODIUM PHOSPHATE 10 MG/ML IJ SOLN
INTRAMUSCULAR | Status: AC
  Filled 2022-10-27: qty 1

## 2022-10-27 MED ORDER — PROPOFOL 10 MG/ML IV BOLUS
INTRAVENOUS | Status: DC | PRN
  Administered 2022-10-27: 50 mg via INTRAVENOUS

## 2022-10-27 MED ORDER — ACETAMINOPHEN 500 MG PO TABS
500.0000 mg | ORAL_TABLET | Freq: Four times a day (QID) | ORAL | Status: AC
  Administered 2022-10-27 – 2022-10-28 (×4): 500 mg via ORAL
  Filled 2022-10-27 (×4): qty 1

## 2022-10-27 MED ORDER — MIDAZOLAM HCL 2 MG/2ML IJ SOLN
INTRAMUSCULAR | Status: AC
  Filled 2022-10-27: qty 2

## 2022-10-27 MED ORDER — PHENYLEPHRINE HCL (PRESSORS) 10 MG/ML IV SOLN
INTRAVENOUS | Status: DC | PRN
  Administered 2022-10-27: 80 ug via INTRAVENOUS

## 2022-10-27 MED ORDER — FENTANYL CITRATE (PF) 100 MCG/2ML IJ SOLN
INTRAMUSCULAR | Status: DC | PRN
  Administered 2022-10-27 (×5): 25 ug via INTRAVENOUS

## 2022-10-27 MED ORDER — BUPIVACAINE HCL (PF) 0.5 % IJ SOLN
INTRAMUSCULAR | Status: AC
  Filled 2022-10-27: qty 30

## 2022-10-27 MED ORDER — ONDANSETRON HCL 4 MG/2ML IJ SOLN
INTRAMUSCULAR | Status: AC
  Filled 2022-10-27: qty 2

## 2022-10-27 MED ORDER — LIDOCAINE HCL (CARDIAC) PF 100 MG/5ML IV SOSY
PREFILLED_SYRINGE | INTRAVENOUS | Status: DC | PRN
  Administered 2022-10-27: 40 mg via INTRAVENOUS

## 2022-10-27 MED ORDER — BISACODYL 10 MG RE SUPP: 10.0000 mg | Freq: Every day | RECTAL | Status: DC | PRN

## 2022-10-27 MED ORDER — DEXAMETHASONE SODIUM PHOSPHATE 10 MG/ML IJ SOLN
INTRAMUSCULAR | Status: DC | PRN
  Administered 2022-10-27: 6 mg via INTRAVENOUS

## 2022-10-27 MED ORDER — 0.9 % SODIUM CHLORIDE (POUR BTL) OPTIME
TOPICAL | Status: DC | PRN
  Administered 2022-10-27: 500 mL

## 2022-10-27 MED ORDER — ONDANSETRON HCL 4 MG PO TABS: 4.0000 mg | ORAL_TABLET | Freq: Four times a day (QID) | ORAL | Status: DC | PRN

## 2022-10-27 MED ORDER — FENTANYL CITRATE (PF) 100 MCG/2ML IJ SOLN
INTRAMUSCULAR | Status: AC
  Filled 2022-10-27: qty 2

## 2022-10-27 MED ORDER — LACTATED RINGERS IV SOLN: INTRAVENOUS | Status: DC

## 2022-10-27 MED ORDER — MIDAZOLAM HCL 2 MG/2ML IJ SOLN
INTRAMUSCULAR | Status: DC | PRN
  Administered 2022-10-27: 1.5 mg via INTRAVENOUS
  Administered 2022-10-27: .5 mg via INTRAVENOUS

## 2022-10-27 MED ORDER — EPHEDRINE 5 MG/ML INJ
INTRAVENOUS | Status: AC
  Filled 2022-10-27: qty 5

## 2022-10-27 MED ORDER — OXYCODONE HCL 5 MG PO TABS: 5.0000 mg | ORAL_TABLET | Freq: Once | ORAL | Status: DC | PRN

## 2022-10-27 MED ORDER — MAGNESIUM HYDROXIDE 400 MG/5ML PO SUSP: 30.0000 mL | Freq: Every day | ORAL | Status: DC | PRN

## 2022-10-27 MED ORDER — ONDANSETRON HCL 4 MG/2ML IJ SOLN: 4.0000 mg | Freq: Once | INTRAMUSCULAR | Status: DC | PRN

## 2022-10-27 MED ORDER — CHLORHEXIDINE GLUCONATE 0.12 % MT SOLN
OROMUCOSAL | Status: AC
  Filled 2022-10-27: qty 15

## 2022-10-27 MED ORDER — PROPOFOL 10 MG/ML IV BOLUS
INTRAVENOUS | Status: AC
  Filled 2022-10-27: qty 20

## 2022-10-27 MED ORDER — PHENYLEPHRINE 80 MCG/ML (10ML) SYRINGE FOR IV PUSH (FOR BLOOD PRESSURE SUPPORT)
PREFILLED_SYRINGE | INTRAVENOUS | Status: AC
  Filled 2022-10-27: qty 10

## 2022-10-27 MED ORDER — DIPHENHYDRAMINE HCL 12.5 MG/5ML PO ELIX: 12.5000 mg | ORAL_SOLUTION | ORAL | Status: DC | PRN

## 2022-10-27 MED ORDER — SODIUM CHLORIDE 0.9 % IV SOLN: INTRAVENOUS | Status: DC

## 2022-10-27 MED ORDER — DOCUSATE SODIUM 100 MG PO CAPS
100.0000 mg | ORAL_CAPSULE | Freq: Two times a day (BID) | ORAL | Status: DC
  Administered 2022-10-27 – 2022-10-29 (×4): 100 mg via ORAL
  Filled 2022-10-27 (×4): qty 1

## 2022-10-27 MED ORDER — FLEET ENEMA RE ENEM: 1.0000 | ENEMA | Freq: Once | RECTAL | Status: DC | PRN

## 2022-10-27 MED ORDER — METOCLOPRAMIDE HCL 5 MG PO TABS: 5.0000 mg | ORAL_TABLET | Freq: Three times a day (TID) | ORAL | Status: DC | PRN

## 2022-10-27 MED ORDER — KETOROLAC TROMETHAMINE 15 MG/ML IJ SOLN: 15.0000 mg | Freq: Four times a day (QID) | INTRAMUSCULAR | Status: AC | PRN

## 2022-10-27 MED ORDER — FENTANYL CITRATE (PF) 100 MCG/2ML IJ SOLN: 25.0000 ug | INTRAMUSCULAR | Status: DC | PRN

## 2022-10-27 MED ORDER — LACTATED RINGERS IV SOLN: INTRAVENOUS | Status: DC | PRN

## 2022-10-27 MED ORDER — METOCLOPRAMIDE HCL 5 MG/ML IJ SOLN: 5.0000 mg | Freq: Three times a day (TID) | INTRAMUSCULAR | Status: DC | PRN

## 2022-10-27 MED ORDER — OXYCODONE HCL 5 MG/5ML PO SOLN: 5.0000 mg | Freq: Once | ORAL | Status: DC | PRN

## 2022-10-27 MED ORDER — BUPIVACAINE HCL (PF) 0.5 % IJ SOLN
INTRAMUSCULAR | Status: DC | PRN
  Administered 2022-10-27: 10 mL

## 2022-10-27 MED ORDER — CLINDAMYCIN PHOSPHATE 600 MG/50ML IV SOLN
INTRAVENOUS | Status: AC
  Filled 2022-10-27: qty 50

## 2022-10-27 MED ORDER — ENOXAPARIN SODIUM 40 MG/0.4ML IJ SOSY: 40.0000 mg | PREFILLED_SYRINGE | INTRAMUSCULAR | Status: DC

## 2022-10-27 MED ORDER — EPHEDRINE SULFATE (PRESSORS) 50 MG/ML IJ SOLN
INTRAMUSCULAR | Status: DC | PRN
  Administered 2022-10-27 (×2): 5 mg via INTRAVENOUS

## 2022-10-27 MED ORDER — LIDOCAINE HCL (PF) 2 % IJ SOLN
INTRAMUSCULAR | Status: AC
  Filled 2022-10-27: qty 5

## 2022-10-27 MED ORDER — PHENYLEPHRINE 80 MCG/ML (10ML) SYRINGE FOR IV PUSH (FOR BLOOD PRESSURE SUPPORT)
PREFILLED_SYRINGE | INTRAVENOUS | Status: DC | PRN
  Administered 2022-10-27: 80 ug via INTRAVENOUS

## 2022-10-27 MED ORDER — KETOROLAC TROMETHAMINE 15 MG/ML IJ SOLN: 15.0000 mg | Freq: Four times a day (QID) | INTRAMUSCULAR | Status: DC | PRN

## 2022-10-27 MED ORDER — ACETAMINOPHEN 325 MG PO TABS
325.0000 mg | ORAL_TABLET | Freq: Four times a day (QID) | ORAL | Status: DC | PRN
  Administered 2022-10-28: 650 mg via ORAL
  Filled 2022-10-27: qty 2

## 2022-10-27 MED ORDER — KETOROLAC TROMETHAMINE 15 MG/ML IJ SOLN: 15.0000 mg | Freq: Once | INTRAMUSCULAR | Status: DC

## 2022-10-27 MED ORDER — ACETAMINOPHEN 10 MG/ML IV SOLN: 15.0000 mg/kg | Freq: Once | INTRAVENOUS | Status: DC | PRN

## 2022-10-27 MED ORDER — ONDANSETRON HCL 4 MG/2ML IJ SOLN
INTRAMUSCULAR | Status: DC | PRN
  Administered 2022-10-27: 3 mg via INTRAVENOUS

## 2022-10-27 MED ORDER — ONDANSETRON HCL 4 MG/2ML IJ SOLN: 4.0000 mg | Freq: Four times a day (QID) | INTRAMUSCULAR | Status: DC | PRN

## 2022-10-27 SURGICAL SUPPLY — 63 items
APL PRP STRL LF DISP 70% ISPRP (MISCELLANEOUS) ×2
BIT DRILL SHORT 2.0 ZI (BIT) IMPLANT
BNDG CMPR 5X3 KNIT ELC UNQ LF (GAUZE/BANDAGES/DRESSINGS) ×1
BNDG CMPR 5X4 CHSV STRCH STRL (GAUZE/BANDAGES/DRESSINGS)
BNDG CMPR 5X4 KNIT ELC UNQ LF (GAUZE/BANDAGES/DRESSINGS)
BNDG COHESIVE 4X5 TAN STRL LF (GAUZE/BANDAGES/DRESSINGS) ×1 IMPLANT
BNDG ELASTIC 3INX 5YD STR LF (GAUZE/BANDAGES/DRESSINGS) IMPLANT
BNDG ELASTIC 4INX 5YD STR LF (GAUZE/BANDAGES/DRESSINGS) ×1 IMPLANT
BNDG ESMARCH 4 X 12 STRL LF (GAUZE/BANDAGES/DRESSINGS) ×1
BNDG ESMARCH 4X12 STRL LF (GAUZE/BANDAGES/DRESSINGS) ×1 IMPLANT
CHLORAPREP W/TINT 26 (MISCELLANEOUS) ×2 IMPLANT
CORD BIP STRL DISP 12FT (MISCELLANEOUS) ×1 IMPLANT
CUFF TOURN SGL QUICK 18X4 (TOURNIQUET CUFF) IMPLANT
DRAPE FLUOR MINI C-ARM 54X84 (DRAPES) ×1 IMPLANT
DRAPE ORTHO SPLIT 77X108 STRL (DRAPES) ×1
DRAPE SURG 17X11 SM STRL (DRAPES) ×1 IMPLANT
DRAPE SURG ORHT 6 SPLT 77X108 (DRAPES) ×1 IMPLANT
DRAPE U-SHAPE 47X51 STRL (DRAPES) ×1 IMPLANT
DRSG OPSITE POSTOP 3X4 (GAUZE/BANDAGES/DRESSINGS) IMPLANT
ELECT CAUTERY BLADE 6.4 (BLADE) ×1 IMPLANT
ELECT REM PT RETURN 9FT ADLT (ELECTROSURGICAL) ×1
ELECTRODE REM PT RTRN 9FT ADLT (ELECTROSURGICAL) ×1 IMPLANT
FORCEPS JEWEL BIP 4-3/4 STR (INSTRUMENTS) ×1 IMPLANT
GAUZE SPONGE 4X4 12PLY STRL (GAUZE/BANDAGES/DRESSINGS) ×1 IMPLANT
GAUZE XEROFORM 1X8 LF (GAUZE/BANDAGES/DRESSINGS) ×1 IMPLANT
GLOVE BIO SURGEON STRL SZ8 (GLOVE) ×1 IMPLANT
GLOVE INDICATOR 8.0 STRL GRN (GLOVE) ×1 IMPLANT
GOWN STRL REUS W/ TWL LRG LVL3 (GOWN DISPOSABLE) ×1 IMPLANT
GOWN STRL REUS W/ TWL XL LVL3 (GOWN DISPOSABLE) ×1 IMPLANT
GOWN STRL REUS W/TWL LRG LVL3 (GOWN DISPOSABLE) ×1
GOWN STRL REUS W/TWL XL LVL3 (GOWN DISPOSABLE) ×1
KIT TURNOVER KIT A (KITS) ×1 IMPLANT
MANIFOLD NEPTUNE II (INSTRUMENTS) ×1 IMPLANT
NDL FILTER BLUNT 18X1 1/2 (NEEDLE) ×1 IMPLANT
NEEDLE FILTER BLUNT 18X1 1/2 (NEEDLE) ×1 IMPLANT
NS IRRIG 500ML POUR BTL (IV SOLUTION) ×1 IMPLANT
PACK EXTREMITY ARMC (MISCELLANEOUS) ×1 IMPLANT
PADDING CAST BLEND 4X4 STRL (MISCELLANEOUS) ×2 IMPLANT
PLATE 2.7 STR 10H (Plate) IMPLANT
PUTTY DBX 1CC (Putty) ×1 IMPLANT
PUTTY DBX 1CC DEPUY (Putty) IMPLANT
SCREW LOCK MDS 2.7X8 (Screw) IMPLANT
SCREW NLOCK 2.7X10 (Screw) IMPLANT
SCREW NLOCK 2.7X12 (Screw) IMPLANT
SCREW NLOCK 2.7X13 (Screw) IMPLANT
SCREW NLOCK 2.7X8 (Screw) IMPLANT
SCREW NLOCK 2.7X9 (Screw) IMPLANT
SCRW NLOCK 2.7X13 (Screw) ×1 IMPLANT
SLING ARM S TX990203 (SOFTGOODS) IMPLANT
SPLINT CAST 1 STEP 3X12 (MISCELLANEOUS) IMPLANT
SPLINT CAST 1 STEP 4X15 (MISCELLANEOUS) IMPLANT
STAPLER SKIN PROX 35W (STAPLE) ×1 IMPLANT
STOCKINETTE IMPERVIOUS 9X36 MD (GAUZE/BANDAGES/DRESSINGS) ×1 IMPLANT
STRIP CLOSURE SKIN 1/4X4 (GAUZE/BANDAGES/DRESSINGS) IMPLANT
SUT PROLENE 4 0 PS 2 18 (SUTURE) ×1 IMPLANT
SUT VIC AB 2-0 SH 27 (SUTURE) ×1
SUT VIC AB 2-0 SH 27XBRD (SUTURE) ×1 IMPLANT
SUT VIC AB 3-0 SH 27 (SUTURE) ×1
SUT VIC AB 3-0 SH 27X BRD (SUTURE) ×1 IMPLANT
SWABSTK COMLB BENZOIN TINCTURE (MISCELLANEOUS) IMPLANT
SYR 10ML LL (SYRINGE) ×1 IMPLANT
TRAP FLUID SMOKE EVACUATOR (MISCELLANEOUS) ×1 IMPLANT
WATER STERILE IRR 500ML POUR (IV SOLUTION) ×1 IMPLANT

## 2022-10-27 NOTE — Progress Notes (Signed)
OT Cancellation Note  Patient Details Name: Antinette Easterbrook MRN: [id number] DOB: 1950-05-13   Cancelled Treatment:    Reason Eval/Treat Not Completed: Other (comment). Pt scheduled for surgery today. OT will re-attempt to see pt tomorrow as appropriate.   Jackquline Denmark, MS, OTR/L , CBIS ascom [phone]  10/27/22, 8:53 AM

## 2022-10-27 NOTE — Transfer of Care (Signed)
Immediate Anesthesia Transfer of Care Note  Patient: Lynn Norton  Procedure(s) Performed: OPEN REDUCTION INTERNAL FIXATION (ORIF) WRIST FRACTURE (Right: Wrist)  Patient Location: PACU  Anesthesia Type:General  Level of Consciousness: drowsy and patient cooperative  Airway & Oxygen Therapy: Patient Spontanous Breathing and Patient connected to face mask oxygen  Post-op Assessment: Report given to RN and Post -op Vital signs reviewed and stable  Post vital signs: Reviewed and stable  Last Vitals:  Vitals Value Taken Time  BP 122/60 10/27/22 1545  Temp    Pulse 80 10/27/22 1548  Resp 14 10/27/22 1548  SpO2 100 % 10/27/22 1548  Vitals shown include unfiled device data.  Last Pain:  Vitals:   10/27/22 1201  TempSrc:   PainSc: 0-No pain      Patients Stated Pain Goal: 0 (10/25/22 2200)  Complications: No notable events documented.

## 2022-10-27 NOTE — Anesthesia Preprocedure Evaluation (Addendum)
Anesthesia Evaluation  Patient identified by MRN, date of birth, ID band Patient awake    Reviewed: Allergy & Precautions, NPO status , Patient's Chart, lab work & pertinent test results  History of Anesthesia Complications Negative for: history of anesthetic complications  Airway Mallampati: IV   Neck ROM: Full    Dental no notable dental hx.    Pulmonary Current Smoker (1 ppd) and Patient abstained from smoking.   Pulmonary exam normal breath sounds clear to auscultation       Cardiovascular hypertension, + Peripheral Vascular Disease  Normal cardiovascular exam Rhythm:Regular Rate:Normal  ECG 10/22/22:  Sinus rhythm Short PR interval Incomplete right bundle branch block   Neuro/Psych  PSYCHIATRIC DISORDERS  Depression    negative neurological ROS     GI/Hepatic ,GERD  ,,Malnutrition    Endo/Other  negative endocrine ROS    Renal/GU Renal disease (stage IV CKD)     Musculoskeletal  (+) Arthritis , Osteoarthritis and Rheumatoid disorders,    Abdominal   Peds  Hematology negative hematology ROS (+)   Anesthesia Other Findings   Reproductive/Obstetrics                             Anesthesia Physical Anesthesia Plan  ASA: 3  Anesthesia Plan: General   Post-op Pain Management:    Induction: Intravenous  PONV Risk Score and Plan: 2 and Ondansetron, Dexamethasone and Treatment may vary due to age or medical condition  Airway Management Planned: LMA  Additional Equipment:   Intra-op Plan:   Post-operative Plan: Extubation in OR  Informed Consent: I have reviewed the patients History and Physical, chart, labs and discussed the procedure including the risks, benefits and alternatives for the proposed anesthesia with the patient or authorized representative who has indicated his/her understanding and acceptance.     Dental advisory given  Plan Discussed with:  CRNA  Anesthesia Plan Comments: (Patient consented for risks of anesthesia including but not limited to:  - adverse reactions to medications - damage to eyes, teeth, lips or other oral mucosa - nerve damage due to positioning  - sore throat or hoarseness - damage to heart, brain, nerves, lungs, other parts of body or loss of life  Informed patient about role of CRNA in peri- and intra-operative care.  Patient voiced understanding.)        Anesthesia Quick Evaluation

## 2022-10-27 NOTE — Progress Notes (Addendum)
[facility]  - [place] at [facility]   PATIENT NAME: Lynn Norton    MR#:  [id number]  DATE OF BIRTH:  31-May-1950  SUBJECTIVE:  patient seen and PACU. She is status post surgery for right distal fibular fracture. No family at bedside  VITALS:  Blood pressure 122/60, pulse 73, temperature 98.7 F (37.1 C), resp. rate 15, height 5\' 2"  (1.575 m), weight 39 kg, SpO2 100%.  PHYSICAL EXAMINATION:   GENERAL:  72 y.o.-year-old patient with no acute distress.  LUNGS: Normal breath sounds bilaterally, no wheezing CARDIOVASCULAR: S1, S2 normal. No murmur   ABDOMEN: Soft, nontender, nondistended. Bowel sounds present.  EXTREMITIES: No  edema b/l.   Severe DJD both hands. Right arm postop dressing present  NEUROLOGIC: nonfocal  patient is alert however sleepy from anesthesia, deconditioned  LABORATORY PANEL:  CBC Recent Labs  Lab 10/25/22 0458  WBC 11.4*  HGB 11.1*  HCT 32.7*  PLT 278    Chemistries  Recent Labs  Lab 10/24/22 0528 10/24/22 0530 10/25/22 0458  NA  --  129* 134*  K  --  3.1* 4.2  CL  --  96* 102  CO2  --  26 26  GLUCOSE  --  99 90  BUN  --  14 17  CREATININE  --  0.68 0.67  CALCIUM  --  8.6* 8.4*  MG 1.7  --   --   AST  --  29  --   ALT  --  21  --   ALKPHOS  --  44  --   BILITOT  --  0.5  --     Assessment and Plan  72 year old with a history of CKD, HTN, RA, tobacco abuse, and alcohol abuse who presented to the ER 8/30 after being found down outside her house following what was felt to have been a mechanical fall. The patient has a longstanding history of severe alcohol abuse, lives alone, and typically requires a walker to ambulate. In the ER she was hemodynamically stable. White count was elevated at 14.4. CT head chest and C-spine were without acute abnormalities.   Mechanical fall suspected due to alcohol abuse, dehydration, and hyponatremia - CT head and neck without evidence of acute injury - PT/OT - suspect intoxication and  overall deconditioning are the primary forces at play here  -- patient overall slowly improving.  Encephalopathy of unclear etiology - generalized confusion --Likely multifactorial and ultimately all likely related to alcohol abuse -- CT head unrevealing -ammonia level normal - empiric thiamine dosing ongoing  --pt's mental status has improved since admit and she is presently A&O x4   Right hand/forearm pain with h/o fall PTA -- x-ray right forearm shows distal ulnar fracture -- orthopedic consultation with Dr. Joice Lofts -- 9/4--patient is status post Open reduction and internal fixation of right distal ulnar shaft fracture.    Alcohol abuse Chronic heavy alcohol use with reported binging over the last 1 to 2 weeks, though patient herself denies - alcohol level within normal limits at presentation, due to patient being down -- patient advised on abstaining from alcohol   Normocytic anemia No evidence of acute blood loss - likely due to chronic alcoholism/malnutrition   Hyponatremia Sodium 125 at presentation with clinical evidence of volume depletion - likely some baseline hyponatremia due to severe alcohol abuse   RA Pt is suppose to be on methotrexate and prednisone - suspect patient is  noncompliant - no evidence of an acute flair at this time  --  pt follows with Dr Gerrie Nordmann   HTN Cont amlodipine   CKD stage IV Creatinine 0.7 at presentation -creatinine actually better than reported baseline of 2.17   Stable 3 mm right upper lobe pulmonary nodule --Noted incidentally on CT chest this admission and stable when compared to previous imaging - appears to have undergone w/u at [employer] w/ CT chest obtained 8/21 classifying the lesion as benign appearing with plan for ongoing annual re-evaluation      Procedures:Open reduction and internal fixation of right distal ulnar shaft fracture.  Family communication : none Consults : none CODE STATUS: full DVT Prophylaxis :  enoxaparin Level of care: Med-Surg Status is: Inpatient Remains inpatient appropriate because: status post ORIF right distal ulnar fracture. PT OT to be resumed    TOTAL TIME TAKING CARE OF THIS PATIENT: 35 minutes.  >50% time spent on counselling and coordination of care  Note: This dictation was prepared with Dragon dictation along with smaller phrase technology. Any transcriptional errors that result from this process are unintentional.  Enedina Finner M.D    [facility]   CC: Primary care physician; Barbette Reichmann, MD

## 2022-10-27 NOTE — Op Note (Signed)
10/27/2022  3:47 PM  Patient:   Lynn Norton  Pre-Op Diagnosis:   Closed displaced right distal ulnar shaft fracture.  Post-Op Diagnosis:   Same  Procedure:   Open reduction and internal fixation of right distal ulnar shaft fracture.  Surgeon:   Maryagnes Amos, MD  Assistant:   Jacqulyn Liner, PA-S  Anesthesia:   General LMA  Findings:   As above.  Complications:   None  Fluids:   700 cc crystalloid  EBL:   3 cc  UOP:   None  TT:   87 minutes at 250 mmHg  Drains:   None  Closure:   3-0 Vicryl subcuticular sutures  Implants:   Zimmer 2.7 mm 10 hole titanium plate  Brief Clinical Note:   The patient is a 72 year old female who sustained above-noted injury 5 days ago when she apparently fell at her home.  The patient was brought to the emergency room and treated for other injuries and medical issues.  However, the patient continued to complain of right wrist pain so an x-ray was obtained yesterday which demonstrated the above-noted injury.  The patient presents at this time for definitive management of this injury.  Procedure:   The patient was brought into the operating room and lain in the supine position.  After adequate general laryngeal mask anesthesia was obtained, the patient's right upper extremity and hand was prepped with ChloraPrep solution before being draped sterilely.  Preoperative antibiotics were administered.  A timeout was performed to verify the appropriate surgical site before the limb was exsanguinated with an Esmarch and the tourniquet inflated to 250 mmHg.  An approximately 8 to 10 cm incision was made along the ulnar aspect of the distal forearm centered over the fracture.  The incision was carried down through the subcutaneous tissues to expose the ulnar edge of the plate.  The plane between the flexor carpi ulnaris and extensor carpi ulnaris tendons was developed to provide access to the distal ulnar shaft.  Subperiosteal dissection was carried out volarly  to permit placement of the plate.  The fracture was carefully reduced using bone tenaculums.  The fracture site was comminuted and the bone quality was noted to be quite poor, so a lag screw could not be utilized to stabilize the fracture.  A Zimmer ALPS 10 hole 2.7 mm titanium plate was selected and applied over the fracture.  The fracture was secured using four bicortically placed screws proximally and five bicortical screws distally.  The adequacy of fracture reduction and hardware position was verified in AP and lateral projections using the FluoroScan unit and found to be excellent.  The wound was copiously irrigated with sterile saline solution using bulb irrigation before bone graft was applied around the fracture site using 1 cc of bone putty and a small amount of autograft that had been retained following removal of an avascular butterfly fragment which was broken up into small pieces prior to reinsertion around the fracture site.  The periosteal/deeper subcutaneous tissues were reapproximated using 2-0 Vicryl interrupted sutures.  The skin was closed using 3-0 Vicryl subcuticular sutures before benzoin and Steri-Strips were applied to the skin.  A total of 10 cc of 0.5% plain Sensorcaine was injected in and around the incision site to help with postoperative analgesia.  A sterile bulky dressing was applied to the wound before the patient was placed into a volar splint, maintaining the wrist in neutral dorsiflexion.  The patient was then awakened, extubated, and returned to the recovery  room in satisfactory condition after tolerating the procedure well.

## 2022-10-27 NOTE — Progress Notes (Signed)
PT Cancellation Note  Patient Details Name: Lynn Norton MRN: [id number] DOB: 1950-03-01   Cancelled Treatment:    Reason Eval/Treat Not Completed: Other (comment): Per chart review patient scheduled for orthopedic surgery this date.  Will attempt to see pt at a future date/time as medically appropriate.    Ovidio Hanger PT, DPT 10/27/22, 8:38 AM

## 2022-10-28 DIAGNOSIS — M069 Rheumatoid arthritis, unspecified: Secondary | ICD-10-CM | POA: Diagnosis not present

## 2022-10-28 DIAGNOSIS — I10 Essential (primary) hypertension: Secondary | ICD-10-CM | POA: Diagnosis not present

## 2022-10-28 DIAGNOSIS — S52601A Unspecified fracture of lower end of right ulna, initial encounter for closed fracture: Secondary | ICD-10-CM | POA: Diagnosis not present

## 2022-10-28 DIAGNOSIS — F1010 Alcohol abuse, uncomplicated: Secondary | ICD-10-CM | POA: Diagnosis not present

## 2022-10-28 MED ORDER — HYDROCODONE-ACETAMINOPHEN 5-325 MG PO TABS: 1.0000 | ORAL_TABLET | Freq: Four times a day (QID) | ORAL | 0 refills | Status: DC | PRN

## 2022-10-28 MED ORDER — ENOXAPARIN SODIUM 30 MG/0.3ML IJ SOSY
30.0000 mg | PREFILLED_SYRINGE | INTRAMUSCULAR | Status: DC
  Administered 2022-10-28 – 2022-10-29 (×2): 30 mg via SUBCUTANEOUS
  Filled 2022-10-28 (×2): qty 0.3

## 2022-10-28 NOTE — Progress Notes (Signed)
  Subjective: 1 Day Post-Op Procedure(s) (LRB): OPEN REDUCTION INTERNAL FIXATION (ORIF) WRIST FRACTURE (Right) Patient reports pain as mild.   Patient is well, and has had no acute complaints or problems PT and care management to assist with discharge planning. Negative for chest pain and shortness of breath Fever: no Gastrointestinal:Negative for nausea and vomiting this morning.  Objective: Vital signs in last 24 hours: Temp:  [97.4 F (36.3 C)-98.7 F (37.1 C)] 98.2 F (36.8 C) (09/05 0430) Pulse Rate:  [72-92] 88 (09/05 0430) Resp:  [15-22] 16 (09/05 0430) BP: (122-149)/(59-85) 146/76 (09/05 0430) SpO2:  [93 %-100 %] 99 % (09/05 0430)  Intake/Output from previous day:  Intake/Output Summary (Last 24 hours) at 10/28/2022 0734 Last data filed at 10/28/2022 0500 Gross per 24 hour  Intake 1415.35 ml  Output 3 ml  Net 1412.35 ml    Intake/Output this shift: No intake/output data recorded.  Labs: No results for input(s): "HGB" in the last 72 hours. No results for input(s): "WBC", "RBC", "HCT", "PLT" in the last 72 hours. No results for input(s): "NA", "K", "CL", "CO2", "BUN", "CREATININE", "GLUCOSE", "CALCIUM" in the last 72 hours. No results for input(s): "LABPT", "INR" in the last 72 hours.   EXAM General - Patient is Alert, Appropriate, and Oriented Extremity - Short arm splint intact to the right arm. Able to flex and extend fingers without pain.  Mild swelling noted to fingers. Cap refill intact.  Intact to light touch to the right arm. Able to flex and extend elbow without pain. Abdomen soft with intact bowel sounds.  Negative homans to bilateral lower extremities.  Past Medical History:  Diagnosis Date   Arthritis    CKD (chronic kidney disease), stage IV (HCC)    HTN (hypertension)    RA (rheumatoid arthritis) (HCC)    Tobacco abuse     Assessment/Plan: 1 Day Post-Op Procedure(s) (LRB): OPEN REDUCTION INTERNAL FIXATION (ORIF) WRIST FRACTURE  (Right) Principal Problem:   Fall at home, initial encounter Active Problems:   Hyponatremia   Rheumatoid arthritis (HCC)   HTN (hypertension)   CKD (chronic kidney disease) stage 4, GFR 15-29 ml/min (HCC)   Encephalopathy   Alcohol abuse   Acute hyponatremia  Estimated body mass index is 15.73 kg/m as calculated from the following:   Height as of this encounter: 5\' 2"  (1.575 m).   Weight as of this encounter: 39 kg. Advance diet Up with therapy D/C IV fluids when tolerating po intake.  Vitals reviewed this AM.  No recent fevers. Intact splint this AM.  Encouraged continued elevation above heart level while laying in bed. Up with therapy today.  Non-weightbearing to the right arm. Continue to work on BM.  She is passing gas.  Follow-up with [facility] in 10-14 days for splint removal and x-rays of the right forearm.  DVT Prophylaxis - Lovenox and TED hose Non-weightbearing to the right arm.  Valeria Batman, PA-C [facility] 10/28/2022, 7:34 AM

## 2022-10-28 NOTE — Anesthesia Postprocedure Evaluation (Signed)
Anesthesia Post Note  Patient: Lynn Norton  Procedure(s) Performed: OPEN REDUCTION INTERNAL FIXATION (ORIF) WRIST FRACTURE (Right: Wrist)  Patient location during evaluation: PACU Anesthesia Type: General Level of consciousness: awake and alert, oriented and patient cooperative Pain management: pain level controlled Vital Signs Assessment: post-procedure vital signs reviewed and stable Respiratory status: spontaneous breathing, nonlabored ventilation and respiratory function stable Cardiovascular status: blood pressure returned to baseline and stable Postop Assessment: adequate PO intake Anesthetic complications: no   No notable events documented.   Last Vitals:  Vitals:   10/28/22 0028 10/28/22 0430  BP: 138/80 (!) 146/76  Pulse: 75 88  Resp: 18 16  Temp: 36.4 C 36.8 C  SpO2: 99% 99%    Last Pain:  Vitals:   10/28/22 0430  TempSrc: Oral  PainSc: 0-No pain                 Reed Breech

## 2022-10-28 NOTE — Discharge Instructions (Signed)
Diet: As you were doing prior to hospitalization   Shower:  May shower but keep the wounds dry, use an occlusive plastic wrap, NO SOAKING IN TUB.  If the bandage gets wet, change with a clean dry gauze.  Dressing:  Keep splint on until your first appointment.  Activity:  Increase activity slowly as tolerated, but follow the weight bearing instructions below.  No lifting or driving for 6 weeks.  Weight Bearing:   Non-weightbearing to the right arm.  To prevent constipation: you may use a stool softener such as -  Colace (over the counter) 100 mg by mouth twice a day  Drink plenty of fluids (prune juice may be helpful) and high fiber foods Miralax (over the counter) for constipation as needed.    Itching:  If you experience itching with your medications, try taking only a single pain pill, or even half a pain pill at a time.  You may take up to 10 pain pills per day, and you can also use benadryl over the counter for itching or also to help with sleep.   Precautions:  If you experience chest pain or shortness of breath - call 911 immediately for transfer to the hospital emergency department!!  If you develop a fever greater that 101 F, purulent drainage from wound, increased redness or drainage from wound, or calf pain-Call [facility]                                              Follow- Up Appointment:  Please call for an appointment to be seen in 2 weeks at [facility]

## 2022-10-28 NOTE — Evaluation (Signed)
Occupational Therapy Re-Evaluation Patient Details Name: Lynn Norton MRN: [id number] DOB: 1950/03/29 Today's Date: 10/28/2022   History of Present Illness Pt admitted to [facility] on 10/22/22 under observation for c/o mechanical fall at home secondary to ETOH vs. Tripping over her cat. C/o posterior scalp discomfort with dried blood noted. Recent admission in July for similar complaints. Dx with recurrent falls in the setting of encephalopathy, alcohol abuse, hyponatremia, and dehydration. Significant PMH includes: CKD, hypertension, rheumatoid arthritis, alcohol abuse presenting with fall, hyponatremia, alcohol abuse. Imaging negative for acute infarct/fracture, but does note Nonobstructive right nephrolithiasis and Stable 3 mm right upper lobe nodule. Patient is now s/p R wrist ORIF, NWB   Clinical Impression   Patient presenting with decreased Ind in self care,balance, functional mobility/transfers, endurance, and safety awareness. Patient reports living at home at [facility] I level with friends assisting with IADLs. Pt with new ORIF of R wrist with NWB precautions. Pt educated on these precautions and ambulating to bathroom while holding onto IV pole with min A for balance. Pt is very unsteady. She is able to void on commode and needs assistance with clothing management and hygiene. Mod A to stand from low commode. Pt returning to bed at end of session.  Patient will benefit from acute OT to increase overall independence in the areas of ADLs, functional mobility, and safety awareness in order to safely discharge.      If plan is discharge home, recommend the following: A little help with walking and/or transfers;A lot of help with bathing/dressing/bathroom;Assistance with cooking/housework;Assist for transportation;Help with stairs or ramp for entrance;Direct supervision/assist for medications management    Functional Status Assessment  Patient has had a recent decline in their functional status and  demonstrates the ability to make significant improvements in function in a reasonable and predictable amount of time.  Equipment Recommendations  BSC/3in1       Precautions / Restrictions Precautions Precautions: Fall Required Braces or Orthoses: Splint/Cast Splint/Cast: L UE Restrictions Weight Bearing Restrictions: Yes LUE Weight Bearing: Non weight bearing      Mobility Bed Mobility Overal bed mobility: Needs Assistance Bed Mobility: Supine to Sit, Sit to Supine     Supine to sit: Supervision Sit to supine: Supervision   General bed mobility comments: cues for NWB R UE    Transfers Overall transfer level: Needs assistance Equipment used: 1 person hand held assist Transfers: Sit to/from Stand Sit to Stand: Min assist                  Balance Overall balance assessment: Needs assistance Sitting-balance support: Feet supported Sitting balance-Leahy Scale: Good     Standing balance support: Single extremity supported, During functional activity, Reliant on assistive device for balance Standing balance-Leahy Scale: Poor                             ADL either performed or assessed with clinical judgement   ADL Overall ADL's : Needs assistance/impaired                         Toilet Transfer: Moderate assistance;Ambulation;Regular Teacher, adult education Details (indicate cue type and reason): mod lifting assistance to stand from toilet Toileting- Clothing Manipulation and Hygiene: Minimal assistance;Sit to/from stand Toileting - Clothing Manipulation Details (indicate cue type and reason): for clothing management and hygiene     Functional mobility during ADLs: Minimal assistance General ADL Comments: min A holding onto  IV pole and needing steadying assistance for safety     Vision Baseline Vision/History: 1 Wears glasses Patient Visual Report: No change from baseline Vision Assessment?: No apparent visual deficits             Pertinent Vitals/Pain Pain Assessment Pain Assessment: No/denies pain     Extremity/Trunk Assessment Upper Extremity Assessment Upper Extremity Assessment: Right hand dominant RUE Deficits / Details: R UE NWB secondary to ORIF   Lower Extremity Assessment Lower Extremity Assessment: Generalized weakness   Cervical / Trunk Assessment Cervical / Trunk Assessment: Normal   Communication Communication Communication: No apparent difficulties Cueing Techniques: Verbal cues   Cognition Arousal: Alert Behavior During Therapy: Impulsive, Restless Overall Cognitive Status: Within Functional Limits for tasks assessed                                 General Comments: Increased confusion during session and needing min - mod cues for safety awareness and precautions                Home Living Family/patient expects to be discharged to:: Skilled nursing facility Living Arrangements: Alone Available Help at Discharge: Friend(s);Available PRN/intermittently Type of Home: House Home Access: Stairs to enter [employer] of Steps: 3 Entrance Stairs-Rails: Right Home Layout: One level     Bathroom Shower/Tub: Producer, television/film/video: Standard     Home Equipment: Cane - single Librarian, academic (2 wheels);Cane - quad;Shower seat - built in;Grab bars - tub/shower          Prior Functioning/Environment Prior Level of Function : Independent/Modified Independent;History of Falls (last six months)             Mobility Comments: Pt reports she is Mod I for functional mobility using a cane or RW. One recent fall that led to current admission. ADLs Comments: Pt reports Mod I ADLs, friend assists with IADLs (cleaning, laundry, transportation).        OT Problem List: Decreased strength;Decreased activity tolerance;Impaired balance (sitting and/or standing);Decreased safety awareness;Decreased knowledge of use of DME or AE;Pain      OT  Treatment/Interventions: Self-care/ADL training;Therapeutic exercise;Energy conservation;DME and/or AE instruction;Therapeutic activities;Patient/family education;Balance training    OT Goals(Current goals can be found in the care plan section) Acute Rehab OT Goals Patient Stated Goal: to go home OT Goal Formulation: With patient Time For Goal Achievement: 11/11/22 Potential to Achieve Goals: Fair  OT Frequency: Min 1X/week       AM-PAC OT "6 Clicks" Daily Activity     Outcome Measure Help from another person eating meals?: A Little Help from another person taking care of personal grooming?: A Little Help from another person toileting, which includes using toliet, bedpan, or urinal?: A Lot Help from another person bathing (including washing, rinsing, drying)?: A Lot Help from another person to put on and taking off regular upper body clothing?: A Lot Help from another person to put on and taking off regular lower body clothing?: A Lot 6 Click Score: 14   End of Session Nurse Communication: Mobility status;Other (comment) (Pt on RA)  Activity Tolerance: Patient tolerated treatment well;Patient limited by fatigue Patient left: in bed;with call bell/phone within reach;with bed alarm set;with family/visitor present  OT Visit Diagnosis: History of falling (Z91.81);Muscle weakness (generalized) (M62.81);Unsteadiness on feet (R26.81)                Time: 8469-6295 OT Time Calculation (min): 12  min Charges:  OT General Charges $OT Visit: 1 Visit OT Evaluation $OT Re-eval: 1 Re-eval  Jackquline Denmark, MS, OTR/L , CBIS ascom [phone]  10/28/22, 11:54 AM

## 2022-10-28 NOTE — TOC Progression Note (Signed)
Transition of Care [facility]) - Progression Note    Patient Details  Name: Emmalyn Cowdin MRN: [id number] Date of Birth: 12/06/1950  [facility] CM/SW Contact  Garret Reddish, RN Phone Number: 10/28/2022, 11:03 AM  Clinical Narrative:   Chart reviewed.  Noted that patient had open reduction internal fixation (ORIF) wrist fracture right on 09/04.    Patient with splint on right wrist area.    Patient has accepted bed offer [facility] when stable for discharge.    TOC will continue to follow for discharge planning.       Expected Discharge Plan: Skilled Nursing Facility    Expected Discharge Plan and Services       Living arrangements for the past 2 months: Single Family Home                                       Social Determinants of Health (SDOH) Interventions SDOH Screenings   Food Insecurity: No Food Insecurity (10/22/2022)  Housing: Low Risk  (10/22/2022)  Transportation Needs: Unmet Transportation Needs (10/22/2022)  Utilities: Not At Risk (10/22/2022)  Financial Resource Strain: Low Risk  (10/12/2022)   Received from [facility]  Tobacco Use: High Risk (10/27/2022)    Readmission Risk Interventions     No data to display

## 2022-10-28 NOTE — Plan of Care (Signed)

## 2022-10-28 NOTE — Progress Notes (Signed)
[facility]  - [place] at [facility]   PATIENT NAME: Lynn Norton    MR#:  [id number]  DATE OF BIRTH:  [birth year]  SUBJECTIVE:  She is status post surgery for right distal fibular fracture. No family at bedside spoke with patient's healthcare power of attorney Verda Cumins on the phone updated. Patient overall doing well.  VITALS:  Blood pressure (!) 152/76, pulse 86, temperature 97.7 F (36.5 C), resp. rate 17, height 5\' 2"  (1.575 m), weight 39 kg, SpO2 95%.  PHYSICAL EXAMINATION:   GENERAL:  72 y.o.-year-old patient with no acute distress.  LUNGS: Normal breath sounds bilaterally, no wheezing CARDIOVASCULAR: S1, S2 normal. No murmur   ABDOMEN: Soft, nontender, nondistended. Bowel sounds present.  EXTREMITIES: No  edema b/l.   Severe DJD both hands. Right arm postop dressing present  NEUROLOGIC: nonfocal  patient is alert and awake. LABORATORY PANEL:  CBC Recent Labs  Lab 10/25/22 0458  WBC 11.4*  HGB 11.1*  HCT 32.7*  PLT 278    Chemistries  Recent Labs  Lab 10/24/22 0528 10/24/22 0530 10/25/22 0458  NA  --  129* 134*  K  --  3.1* 4.2  CL  --  96* 102  CO2  --  26 26  GLUCOSE  --  99 90  BUN  --  14 17  CREATININE  --  0.68 0.67  CALCIUM  --  8.6* 8.4*  MG 1.7  --   --   AST  --  29  --   ALT  --  21  --   ALKPHOS  --  44  --   BILITOT  --  0.5  --     Assessment and Plan  72 year old with a history of CKD, HTN, RA, tobacco abuse, and alcohol abuse who presented to the ER 8/30 after being found down outside her house following what was felt to have been a mechanical fall. The patient has a longstanding history of severe alcohol abuse, lives alone, and typically requires a walker to ambulate. In the ER she was hemodynamically stable. White count was elevated at 14.4. CT head chest and C-spine were without acute abnormalities.   Mechanical fall suspected due to alcohol abuse, dehydration, and hyponatremia - CT head and neck without  evidence of acute injury - PT/OT - suspect intoxication and overall deconditioning are the primary forces at play here  -- patient overall slowly improving.  Encephalopathy of unclear etiology - generalized confusion --Likely multifactorial and ultimately all likely related to alcohol abuse -- CT head unrevealing -ammonia level normal - empiric thiamine dosing ongoing  --pt's mental status has improved since admit and she is presently A&O x4   Right hand/forearm pain with h/o fall PTA -- x-ray right forearm shows distal ulnar fracture -- orthopedic consultation with Dr. Joice Lofts -- 9/4--patient is status post Open reduction and internal fixation of right distal ulnar shaft fracture.  -- PT OT to see patient today   Alcohol abuse Chronic heavy alcohol use with reported binging over the last 1 to 2 weeks, though patient herself denies - alcohol level within normal limits at presentation, due to patient being down -- patient advised on abstaining from alcohol   Normocytic anemia No evidence of acute blood loss - likely due to chronic alcoholism/malnutrition   Hyponatremia Sodium 125 at presentation with clinical evidence of volume depletion - likely some baseline hyponatremia due to severe alcohol abuse   RA Pt is suppose to be on methotrexate and  prednisone - suspect patient is  noncompliant - no evidence of an acute flair at this time  --pt follows with Dr Gerrie Nordmann   HTN Cont amlodipine   CKD stage IV Creatinine 0.7 at presentation -creatinine actually better than reported baseline of 2.17   Stable 3 mm right upper lobe pulmonary nodule --Noted incidentally on CT chest this admission and stable when compared to previous imaging - appears to have undergone w/u at [facility] w/ CT chest obtained 8/21 classifying the lesion as benign appearing with plan for ongoing annual re-evaluation      Procedures:Open reduction and internal fixation of right distal ulnar shaft fracture.   Family communication : none Consults : none CODE STATUS: full DVT Prophylaxis : enoxaparin Level of care: Med-Surg Status is: Inpatient Remains inpatient appropriate because: status post ORIF right distal ulnar fracture. PT OT to be resumed and TOC for discharge planning. Awaiting insurance authorization.    TOTAL TIME TAKING CARE OF THIS PATIENT: 35 minutes.  >50% time spent on counselling and coordination of care  Note: This dictation was prepared with Dragon dictation along with smaller phrase technology. Any transcriptional errors that result from this process are unintentional.  Enedina Finner M.D    [facility]   CC: Primary care physician; Barbette Reichmann, MD

## 2022-10-28 NOTE — Evaluation (Signed)
Physical Therapy Evaluation Patient Details Name: Lynn Norton MRN: [id number] DOB: 11-17-1950 Today's Date: 10/28/2022  History of Present Illness  Pt admitted to [facility] on 10/22/22 under observation for c/o mechanical fall at home secondary to ETOH vs. Tripping over her cat. C/o posterior scalp discomfort with dried blood noted. Recent admission in July for similar complaints. Dx with recurrent falls in the setting of encephalopathy, alcohol abuse, hyponatremia, and dehydration. Significant PMH includes: CKD, hypertension, rheumatoid arthritis, alcohol abuse presenting with fall, hyponatremia, alcohol abuse. Imaging negative for acute infarct/fracture, but does note Nonobstructive right nephrolithiasis and Stable 3 mm right upper lobe nodule. Patient is now s/p R wrist ORIF, NWB   Clinical Impression  Patient received in bed, POA at bedside. Patient reports she needs to go to the bathroom upon arrival to room. She is min A for bed mobility and transfers. Patient is very unsteady in standing. Instructed her to hold to IV pole to ambulate to bathroom. She will benefit from R platform RW for further ambulation trials. Requires min A for safety. Patient will continue to benefit from skilled PT to improve functional mobility and independence.            If plan is discharge home, recommend the following: A little help with walking and/or transfers;A little help with bathing/dressing/bathroom;Assistance with cooking/housework;Direct supervision/assist for medications management;Assist for transportation;Help with stairs or ramp for entrance   Can travel by private vehicle   Yes    Equipment Recommendations None recommended by PT (TBD- R Platform RW?)  Recommendations for Other Services       Functional Status Assessment Patient has had a recent decline in their functional status and demonstrates the ability to make significant improvements in function in a reasonable and predictable amount of time.      Precautions / Restrictions Precautions Precautions: Fall Required Braces or Orthoses: Splint/Cast Splint/Cast: L UE Restrictions Weight Bearing Restrictions: Yes LUE Weight Bearing: Non weight bearing      Mobility  Bed Mobility Overal bed mobility: Needs Assistance Bed Mobility: Supine to Sit, Sit to Supine     Supine to sit: Supervision Sit to supine: Supervision   General bed mobility comments: cues for NWB R UE    Transfers Overall transfer level: Needs assistance Equipment used: 1 person hand held assist Transfers: Sit to/from Stand Sit to Stand: Min assist           General transfer comment: patient unsteady with sit to stand    Ambulation/Gait Ambulation/Gait assistance: Min assist Gait Distance (Feet): 30 Feet Assistive device: IV Pole, 1 person hand held assist Gait Pattern/deviations: Step-through pattern, Staggering left, Staggering right, Ataxic       General Gait Details: patient very unsteady with ambulation to bathroom holding IV pole. High Fall risk.  Stairs            Wheelchair Mobility     Tilt Bed    Modified Rankin (Stroke Patients Only)       Balance Overall balance assessment: Needs assistance Sitting-balance support: Feet supported Sitting balance-Leahy Scale: Good     Standing balance support: Single extremity supported, During functional activity, Reliant on assistive device for balance Standing balance-Leahy Scale: Poor                               Pertinent Vitals/Pain Pain Assessment Pain Assessment: No/denies pain Pain Intervention(s): Monitored during session    Home Living Family/patient expects to be  discharged to:: Skilled nursing facility Living Arrangements: Alone Available Help at Discharge: Friend(s);Available PRN/intermittently Type of Home: House Home Access: Stairs to enter Entrance Stairs-Rails: Right Entrance Stairs-Number of Steps: 3   Home Layout: One level Home  Equipment: Cane - single Librarian, academic (2 wheels);Cane - quad;Shower seat - built in;Grab bars - tub/shower      Prior Function Prior Level of Function : Independent/Modified Independent;History of Falls (last six months)             Mobility Comments: Pt reports she is Mod I for functional mobility using a cane or RW. One recent fall that led to current admission. ADLs Comments: Pt reports Mod I ADLs, friend assists with IADLs (cleaning, laundry, transportation).     Extremity/Trunk Assessment   Upper Extremity Assessment Upper Extremity Assessment: Defer to OT evaluation    Lower Extremity Assessment Lower Extremity Assessment: Generalized weakness    Cervical / Trunk Assessment Cervical / Trunk Assessment: Normal  Communication   Communication Communication: No apparent difficulties Cueing Techniques: Verbal cues  Cognition Arousal: Alert Behavior During Therapy: Impulsive Overall Cognitive Status: Within Functional Limits for tasks assessed                                          General Comments      Exercises     Assessment/Plan    PT Assessment Patient needs continued PT services  PT Problem List Decreased strength;Decreased activity tolerance;Decreased balance;Decreased mobility;Decreased coordination;Decreased cognition;Decreased safety awareness;Decreased knowledge of precautions;Decreased knowledge of use of DME       PT Treatment Interventions DME instruction;Gait training;Stair training;Functional mobility training;Therapeutic activities;Therapeutic exercise;Balance training;Neuromuscular re-education;Patient/family education;Cognitive remediation    PT Goals (Current goals can be found in the Care Plan section)  Acute Rehab PT Goals Patient Stated Goal: "get back to being myself" PT Goal Formulation: With patient Time For Goal Achievement: 11/06/22 Potential to Achieve Goals: Fair    Frequency Min 1X/week      Co-evaluation               AM-PAC PT "6 Clicks" Mobility  Outcome Measure Help needed turning from your back to your side while in a flat bed without using bedrails?: A Little Help needed moving from lying on your back to sitting on the side of a flat bed without using bedrails?: A Little Help needed moving to and from a bed to a chair (including a wheelchair)?: A Little Help needed standing up from a chair using your arms (e.g., wheelchair or bedside chair)?: A Little Help needed to walk in hospital room?: A Lot Help needed climbing 3-5 steps with a railing? : A Lot 6 Click Score: 16    End of Session   Activity Tolerance: Patient tolerated treatment well Patient left: in bed;with call bell/phone within reach;with bed alarm set;with family/visitor present Nurse Communication: Mobility status PT Visit Diagnosis: Unsteadiness on feet (R26.81);Other abnormalities of gait and mobility (R26.89);Muscle weakness (generalized) (M62.81);Difficulty in walking, not elsewhere classified (R26.2);History of falling (Z91.81)    Time: 1324-4010 PT Time Calculation (min) (ACUTE ONLY): 11 min   Charges:   PT Evaluation $PT Eval Moderate Complexity: 1 Mod   PT General Charges $$ ACUTE PT VISIT: 1 Visit         Eh Sesay, PT, GCS 10/28/22,11:44 AM

## 2022-10-29 DIAGNOSIS — Y92009 Unspecified place in unspecified non-institutional (private) residence as the place of occurrence of the external cause: Secondary | ICD-10-CM | POA: Diagnosis not present

## 2022-10-29 DIAGNOSIS — M069 Rheumatoid arthritis, unspecified: Secondary | ICD-10-CM | POA: Diagnosis not present

## 2022-10-29 DIAGNOSIS — W19XXXA Unspecified fall, initial encounter: Secondary | ICD-10-CM | POA: Diagnosis not present

## 2022-10-29 DIAGNOSIS — E871 Hypo-osmolality and hyponatremia: Secondary | ICD-10-CM | POA: Diagnosis not present

## 2022-10-29 MED ORDER — ORAL CARE MOUTH RINSE: 15.0000 mL | OROMUCOSAL | Status: DC | PRN

## 2022-10-29 MED ORDER — NICOTINE 21 MG/24HR TD PT24: 21.0000 mg | MEDICATED_PATCH | Freq: Every day | TRANSDERMAL | 0 refills | Status: DC

## 2022-10-29 MED ORDER — VITAMIN B-1 100 MG PO TABS: 100.0000 mg | ORAL_TABLET | Freq: Every day | ORAL | 0 refills | Status: AC

## 2022-10-29 MED ORDER — ADULT MULTIVITAMIN W/MINERALS CH: 1.0000 | ORAL_TABLET | Freq: Every day | ORAL | 0 refills | Status: DC

## 2022-10-29 MED ORDER — AMLODIPINE BESYLATE 10 MG PO TABS: 10.0000 mg | ORAL_TABLET | Freq: Every day | ORAL | 1 refills | Status: AC

## 2022-10-29 MED ORDER — DOCUSATE SODIUM 100 MG PO CAPS: 100.0000 mg | ORAL_CAPSULE | Freq: Two times a day (BID) | ORAL | 0 refills | Status: AC

## 2022-10-29 NOTE — TOC Progression Note (Signed)
Transition of Care [facility]) - Progression Note    Patient Details  Name: Lynn Norton MRN: [id number] Date of Birth: 1951/02/22  Transition of [facility]) CM/SW Contact  Garret Reddish, RN Phone Number: 10/29/2022, 10:52 AM  Clinical Narrative:   Chart reviewed.  SNF authorization submitted.  Patient has been approved for SNF.  Auth ID is [id number].  Patient has been approved from 10-29-22- 11-02-22.  The next review date is 11/02/22.    I have spoken with Kenney Houseman, Admission Coordinator at [facility] and she informs me that patient is able to come to the facility today.  I have informed Dr. Allena Katz of the above information.      Expected Discharge Plan: Skilled Nursing Facility    Expected Discharge Plan and Services       Living arrangements for the past 2 months: Single Family Home                                       Social Determinants of Health (SDOH) Interventions SDOH Screenings   Food Insecurity: No Food Insecurity (10/22/2022)  Housing: Low Risk  (10/22/2022)  Transportation Needs: Unmet Transportation Needs (10/22/2022)  Utilities: Not At Risk (10/22/2022)  Financial Resource Strain: Low Risk  (10/12/2022)   Received from [facility]  Tobacco Use: High Risk (10/27/2022)    Readmission Risk Interventions     No data to display

## 2022-10-29 NOTE — Progress Notes (Signed)
  Subjective: 2 Days Post-Op Procedure(s) (LRB): OPEN REDUCTION INTERNAL FIXATION (ORIF) WRIST FRACTURE (Right) Patient is sleeping soundly this morning, no signs of pain in the wrist.  Patient is well, and has had no acute complaints or problems Current plan is for discharge to SNF pending insurance approval. Negative for chest pain and shortness of breath Fever: no Gastrointestinal:Negative for nausea and vomiting this morning. She has had a BM since surgery.  Objective: Vital signs in last 24 hours: Temp:  [97.7 F (36.5 C)-98.2 F (36.8 C)] 98.2 F (36.8 C) (09/06 0540) Pulse Rate:  [76-86] 85 (09/06 0540) Resp:  [17-20] 18 (09/06 0540) BP: (131-152)/(61-78) 142/64 (09/06 0540) SpO2:  [94 %-97 %] 95 % (09/06 0540)  Intake/Output from previous day:  Intake/Output Summary (Last 24 hours) at 10/29/2022 0725 Last data filed at 10/28/2022 1500 Gross per 24 hour  Intake 380.67 ml  Output --  Net 380.67 ml    Intake/Output this shift: No intake/output data recorded.  Labs: No results for input(s): "HGB" in the last 72 hours. No results for input(s): "WBC", "RBC", "HCT", "PLT" in the last 72 hours. No results for input(s): "NA", "K", "CL", "CO2", "BUN", "CREATININE", "GLUCOSE", "CALCIUM" in the last 72 hours. No results for input(s): "LABPT", "INR" in the last 72 hours.   EXAM General - Patient is sleepy this morning but answers my questions appropriately. Extremity - Short arm splint intact to the right arm. Able to flex and extend fingers without pain.  Mild swelling noted to fingers. Cap refill intact.  Intact to light touch to the right arm. Able to flex and extend elbow without pain. Abdomen soft with intact bowel sounds.  Negative homans to bilateral lower extremities.  Past Medical History:  Diagnosis Date   Arthritis    CKD (chronic kidney disease), stage IV (HCC)    HTN (hypertension)    RA (rheumatoid arthritis) (HCC)    Tobacco abuse      Assessment/Plan: 2 Days Post-Op Procedure(s) (LRB): OPEN REDUCTION INTERNAL FIXATION (ORIF) WRIST FRACTURE (Right) Principal Problem:   Fall at home, initial encounter Active Problems:   Hyponatremia   Rheumatoid arthritis (HCC)   HTN (hypertension)   CKD (chronic kidney disease) stage 4, GFR 15-29 ml/min (HCC)   Encephalopathy   Alcohol abuse   Acute hyponatremia  Estimated body mass index is 15.73 kg/m as calculated from the following:   Height as of this encounter: 5\' 2"  (1.575 m).   Weight as of this encounter: 39 kg. Advance diet Up with therapy D/C IV fluids when tolerating po intake.  Vitals reviewed this AM.  No recent fevers. Intact splint this AM.  Encouraged continued elevation above heart level while laying in bed. Up with therapy today.  Non-weightbearing to the right arm. She has had a BM following surgery. Cleared for discharge from orthopaedic standpoint.  Follow-up with [facility] in 10-14 days for splint removal and x-rays of the right forearm.  DVT Prophylaxis - Lovenox and TED hose Non-weightbearing to the right arm.  Valeria Batman, PA-C [facility] Orthopaedic Surgery 10/29/2022, 7:25 AM

## 2022-10-29 NOTE — Progress Notes (Signed)
Discharged. Report called to [facility]. AVS printed and reviewed during report. . Patient awaiting EMS for transport.

## 2022-10-29 NOTE — Progress Notes (Signed)
Mobility Specialist - Progress Note   10/29/22 1215  Mobility  Activity Ambulated with assistance in room;Transferred from bed to chair  Level of Assistance Contact guard assist, steadying assist  Assistive Device Other (Comment) (HHA)  Distance Ambulated (ft) 6 ft  Activity Response Tolerated well  $Mobility charge 1 Mobility     Pt lying in bed upon arrival, utilizing RA. Pt agreeable to activity. Pt completed bed mobility independently following WB protocols. HHA for transfer to chair. Alarm set, needs in reach. No complaints.    Filiberto Pinks Mobility Specialist 10/29/22, 12:31 PM

## 2022-10-29 NOTE — Discharge Summary (Signed)
Physician Discharge Summary   Patient: Lynn Norton MRN: [id number] DOB: 31-Jan-1951  Admit date:     10/22/2022  Discharge date: 10/29/22  Discharge Physician: Enedina Finner   PCP: Barbette Reichmann, MD   Recommendations at discharge:    F/u Horris Latino ortho PA, in 14 days F/u dr Marcello Fennel in 1-2 weeks patient advised to stop smoking and abstain from drinking alcohol  Discharge Diagnoses: Principal Problem:   Fall at home, initial encounter Active Problems:   Rheumatoid arthritis (HCC)   Encephalopathy   Hyponatremia   HTN (hypertension)   CKD (chronic kidney disease) stage 4, GFR 15-29 ml/min (HCC)   Alcohol abuse   Acute hyponatremia  72 year old with a history of CKD, HTN, RA, tobacco abuse, and alcohol abuse who presented to the ER 8/30 after being found down outside her house following what was felt to have been a mechanical fall. The patient has a longstanding history of severe alcohol abuse, lives alone, and typically requires a walker to ambulate. In the ER she was hemodynamically stable. White count was elevated at 14.4. CT head chest and C-spine were without acute abnormalities.    Mechanical fall suspected due to alcohol abuse, dehydration, and hyponatremia - CT head and neck without evidence of acute injury - PT/OT - suspect intoxication and overall deconditioning are the primary forces at play here  -- patient overall slowly improving.  Right hand/forearm pain with h/o fall PTA -- x-ray right forearm shows distal ulnar fracture -- orthopedic consultation with Dr. Joice Lofts -- 9/4--patient is status post Open reduction and internal fixation of right distal ulnar shaft fracture.  -- PT OT input appreciated. -- Okay from orthopedic end for discharge.   Encephalopathy of unclear etiology - generalized confusion --Likely multifactorial and ultimately all likely related to alcohol abuse -- CT head unrevealing -ammonia level normal - empiric thiamine dosing ongoing  --pt's  mental status has improved since admit and she is presently A&O x4     Alcohol abuse Chronic heavy alcohol use with reported binging over the last 1 to 2 weeks, though patient herself denies - alcohol level within normal limits at presentation, due to patient being down -- patient advised on abstaining from alcohol   Normocytic anemia No evidence of acute blood loss - likely due to chronic alcoholism/malnutrition   Hyponatremia Sodium 125 at presentation with clinical evidence of volume depletion - likely some baseline hyponatremia due to severe alcohol abuse   RA Pt is suppose to be on methotrexate and prednisone - suspect patient is  noncompliant - no evidence of an acute flair at this time  --pt follows with Dr Gerrie Nordmann -- I discussed with patient and she is in agreement to take her methotrexate and prednisone consistently. I have resume the meds at discharge. She is also recommended to follow-up with Dr. Allena Katz when she is out of rehab   HTN Cont amlodipine   CKD stage IV Creatinine 0.7 at presentation -creatinine actually better than reported baseline of 2.17 --pt follows with Nephrology as out pt   Stable 3 mm right upper lobe pulmonary nodule --Noted incidentally on CT chest this admission and stable when compared to previous imaging - appears to have undergone w/u at [facility] w/ CT chest obtained 8/21 classifying the lesion as benign appearing with plan for ongoing annual re-evaluation         Procedures:Open reduction and internal fixation of right distal ulnar shaft fracture.  Family communication : Spoke with [facility] on the  phone 10/28/2022 and updated Consults : Orthopedic CODE STATUS: full DVT Prophylaxis : enoxaparin     Pain control - [facility] Controlled Substance Reporting System database was reviewed. and patient was instructed, not to drive, operate heavy machinery, perform activities at heights, swimming or participation in water activities  or provide baby-sitting services while on Pain, Sleep and Anxiety Medications; until their outpatient Physician has advised to do so again. Also recommended to not to take more than prescribed Pain, Sleep and Anxiety Medications.  Disposition: Rehabilitation facility Diet recommendation:  Discharge Diet Orders (From admission, onward)     Start     Ordered   10/29/22 0000  Diet - low sodium heart healthy        10/29/22 1102            DISCHARGE MEDICATION: Allergies as of 10/29/2022       Reactions   Sulfa Antibiotics Other (See Comments), Itching   Ceftriaxone Rash   Penicillins Rash, Other (See Comments)   Rocephin [ceftriaxone] Hives   Spoke with patient. States she had hives that spread in response to rocephin   Sulfa Antibiotics    Penicillins Itching        Medication List     TAKE these medications    albuterol 108 (90 Base) MCG/ACT inhaler Commonly known as: VENTOLIN HFA Inhale 1-2 puffs into the lungs 4 (four) times daily as needed for wheezing or shortness of breath.   amLODipine 10 MG tablet Commonly known as: NORVASC Take 1 tablet (10 mg total) by mouth daily. Start taking on: October 30, 2022 What changed:  medication strength how much to take   docusate sodium 100 MG capsule Commonly known as: COLACE Take 1 capsule (100 mg total) by mouth 2 (two) times daily.   folic acid 1 MG tablet Commonly known as: FOLVITE Take 1 mg by mouth daily.   HYDROcodone-acetaminophen 5-325 MG tablet Commonly known as: NORCO/VICODIN Take 1 tablet by mouth every 6 (six) hours as needed for severe pain.   methotrexate 2.5 MG tablet Commonly known as: RHEUMATREX Take 20 mg by mouth every Thursday.   mirtazapine 7.5 MG tablet Commonly known as: REMERON Take 7.5 mg by mouth at bedtime.   multivitamin with minerals Tabs tablet Take 1 tablet by mouth daily. Start taking on: October 30, 2022   nicotine 21 mg/24hr patch Commonly known as: NICODERM CQ - dosed in  mg/24 hours Place 1 patch (21 mg total) onto the skin daily. Start taking on: October 30, 2022   PARoxetine 10 MG tablet Commonly known as: PAXIL Take 10 mg by mouth daily.   predniSONE 5 MG tablet Commonly known as: DELTASONE Take 5 mg by mouth daily.   thiamine 100 MG tablet Commonly known as: Vitamin B-1 Take 1 tablet (100 mg total) by mouth daily. Start taking on: October 30, 2022               Discharge Care Instructions  (From admission, onward)           Start     Ordered   10/29/22 0000  Discharge wound care:       Comments: Wound Care Orders (From admission, onward)      Start     Ordered    10/27/22 1859    Reinforce dressing  Until discontinued       10/27/22 1858   10/29/22 1102            Contact information for follow-up  providers     Anson Oregon, PA-C Follow up in 14 day(s).   Specialty: Physician Assistant Why: Mindi Slicker information: [address] [place] [address] [phone]         Barbette Reichmann, MD. Schedule an appointment as soon as possible for a visit in 1 week(s).   Specialty: Internal Medicine Why: hospital f/u Contact information: [address] [place] [address] [phone]              Contact information for after-discharge care     Destination     [facility] .   Service: Skilled Nursing Contact information: [address] [place] [address] [phone]                    Discharge Exam: Ceasar Mons Weights   10/22/22 0749  Weight: 39 kg   GENERAL:  72 y.o.-year-old patient with no acute distress.  LUNGS: Normal breath sounds bilaterally, no wheezing CARDIOVASCULAR: S1, S2 normal. No murmur   ABDOMEN: Soft, nontender, nondistended. Bowel sounds present.  EXTREMITIES: No  edema b/l.   Severe DJD both hands. Right arm postop dressing present  NEUROLOGIC: nonfocal  patient is alert and  awake.  Condition at discharge: fair  The results of significant diagnostics from this hospitalization (including imaging, microbiology, ancillary and laboratory) are listed below for reference.   Imaging Studies: DG MINI C-ARM IMAGE ONLY  Result Date: 10/27/2022 There is no interpretation for this exam.  This order is for images obtained during a surgical procedure.  Please See "Surgeries" Tab for more information regarding the procedure.   DG Forearm Right  Result Date: 10/26/2022 CLINICAL DATA:  Right wrist pain after recent fall. EXAM: RIGHT FOREARM - 2 VIEW COMPARISON:  None Available. FINDINGS: Oblique, mildly displaced and angulated fracture of the distal right ulnar diaphysis demonstrates up to approximately half of a shaft width displacement on the lateral view. Previously placed screw through the olecranon process of the proximal ulna and extending into the proximal diaphysis. IMPRESSION: Oblique, mildly displaced and angulated fracture of the distal right ulnar diaphysis. Prior ORIF of the proximal right ulna. Electronically Signed   By: Irish Lack M.D.   On: 10/26/2022 15:36   DG Hand 2 View Right  Result Date: 10/26/2022 CLINICAL DATA:  Right wrist pain after recent fall. EXAM: RIGHT HAND - 2 VIEW COMPARISON:  None Available. FINDINGS: Mildly displaced and oblique fracture of the distal ulnar diaphysis is seen at the bottom of the film. No other acute fractures identified. Moderately advanced arthritis involving all MCP joints, the first CMC joint, second through fifth DIP joints and the radiocarpal joint. IMPRESSION: 1. Mildly displaced and oblique fracture of the right distal ulnar diaphysis. 2. 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 Electronically Signed   By: Irish Lack M.D.   On: 10/26/2022 15:33   CT HEAD WO CONTRAST ( )  Result Date: 10/22/2022 CLINICAL DATA:  Trauma EXAM: CT HEAD WITHOUT CONTRAST CT CERVICAL SPINE WITHOUT CONTRAST TECHNIQUE:  Multidetector CT imaging of the head and cervical spine was performed following the standard protocol without intravenous contrast. Multiplanar CT image reconstructions of the cervical spine were also generated. RADIATION DOSE REDUCTION: This exam was performed according to the departmental dose-optimization program which includes automated exposure control, adjustment of the mA and/or kV according to patient size and/or use of iterative reconstruction technique. COMPARISON:  CT head/cervical spine dated 09/08/2022 FINDINGS: CT HEAD FINDINGS  Brain: No evidence of acute infarction, hemorrhage, hydrocephalus, extra-axial collection or mass lesion/mass effect. Global cortical atrophy. Subcortical white matter and periventricular small vessel ischemic changes. Vascular: Mild intracranial atherosclerosis. Skull: Normal. Negative for fracture or focal lesion. Sinuses/Orbits: The visualized paranasal sinuses are essentially clear. The mastoid air cells are unopacified. Other: None. CT CERVICAL SPINE FINDINGS Alignment: Reversal of the normal mid cervical lordosis. Skull base and vertebrae: No acute fracture. No primary bone lesion or focal pathologic process. Soft tissues and spinal canal: No prevertebral fluid or swelling. No visible canal hematoma. Disc levels: Mild degenerative changes of the mid cervical spine. Spinal canal is patent. Upper chest: Evaluated on dedicated CT chest. Other: None. IMPRESSION: No acute intracranial abnormality. Atrophy with small vessel ischemic changes. No traumatic injury to the cervical spine. Mild degenerative changes. Electronically Signed   By: Charline Bills M.D.   On: 10/22/2022 08:39   CT Cervical Spine Wo Contrast  Result Date: 10/22/2022 CLINICAL DATA:  Trauma EXAM: CT HEAD WITHOUT CONTRAST CT CERVICAL SPINE WITHOUT CONTRAST TECHNIQUE: Multidetector CT imaging of the head and cervical spine was performed following the standard protocol without intravenous contrast.  Multiplanar CT image reconstructions of the cervical spine were also generated. RADIATION DOSE REDUCTION: This exam was performed according to the departmental dose-optimization program which includes automated exposure control, adjustment of the mA and/or kV according to patient size and/or use of iterative reconstruction technique. COMPARISON:  CT head/cervical spine dated 09/08/2022 FINDINGS: CT HEAD FINDINGS Brain: No evidence of acute infarction, hemorrhage, hydrocephalus, extra-axial collection or mass lesion/mass effect. Global cortical atrophy. Subcortical white matter and periventricular small vessel ischemic changes. Vascular: Mild intracranial atherosclerosis. Skull: Normal. Negative for fracture or focal lesion. Sinuses/Orbits: The visualized paranasal sinuses are essentially clear. The mastoid air cells are unopacified. Other: None. CT CERVICAL SPINE FINDINGS Alignment: Reversal of the normal mid cervical lordosis. Skull base and vertebrae: No acute fracture. No primary bone lesion or focal pathologic process. Soft tissues and spinal canal: No prevertebral fluid or swelling. No visible canal hematoma. Disc levels: Mild degenerative changes of the mid cervical spine. Spinal canal is patent. Upper chest: Evaluated on dedicated CT chest. Other: None. IMPRESSION: No acute intracranial abnormality. Atrophy with small vessel ischemic changes. No traumatic injury to the cervical spine. Mild degenerative changes. Electronically Signed   By: Charline Bills M.D.   On: 10/22/2022 08:39   CT CHEST WO CONTRAST  Result Date: 10/22/2022 CLINICAL DATA:  Blunt chest trauma. EXAM: CT CHEST WITHOUT CONTRAST TECHNIQUE: Multidetector CT imaging of the chest was performed following the standard protocol without IV contrast. RADIATION DOSE REDUCTION: This exam was performed according to the departmental dose-optimization program which includes automated exposure control, adjustment of the mA and/or kV according to  patient size and/or use of iterative reconstruction technique. COMPARISON:  October 13, 2022. FINDINGS: Cardiovascular: Atherosclerosis of thoracic aorta is noted without aneurysm formation. Normal cardiac size. No pericardial effusion. Mediastinum/Nodes: No enlarged mediastinal or axillary lymph nodes. Thyroid gland, trachea, and esophagus demonstrate no significant findings. Lungs/Pleura: No pneumothorax or pleural effusion is noted. Emphysematous disease is noted. Stable atelectasis of right middle lobe is noted. Stable 3 mm nodule is noted laterally in right upper lobe best seen on image number 51 of series 4. Upper Abdomen: Nonobstructive right nephrolithiasis. Musculoskeletal: No chest wall mass or suspicious bone lesions identified. IMPRESSION: No definite traumatic injury seen in the chest. Stable atelectasis of right middle lobe. Nonobstructive right nephrolithiasis. Stable 3 mm right upper lobe nodule. No follow-up  needed if patient is low-risk.This recommendation follows the consensus statement: Guidelines for Management of Incidental Pulmonary Nodules Detected on CT Images: From the Fleischner Society 2017; Radiology 2017; 284:228-243. Aortic Atherosclerosis (ICD10-I70.0) and Emphysema (ICD10-J43.9). Electronically Signed   By: Lupita Raider M.D.   On: 10/22/2022 08:38   CT CHEST LUNG CA SCREEN LOW DOSE W/O CM  Result Date: 10/20/2022 CLINICAL DATA:  72 year old female with 60 pack-year history of smoking. Lung cancer screening. EXAM: CT CHEST WITHOUT CONTRAST LOW-DOSE FOR LUNG CANCER SCREENING TECHNIQUE: Multidetector CT imaging of the chest was performed following the standard protocol without IV contrast. RADIATION DOSE REDUCTION: This exam was performed according to the departmental dose-optimization program which includes automated exposure control, adjustment of the mA and/or kV according to patient size and/or use of iterative reconstruction technique. COMPARISON:  None Available. FINDINGS:  Cardiovascular: The heart size is normal. No substantial pericardial effusion. Coronary artery calcification is evident. Mild atherosclerotic calcification is noted in the wall of the thoracic aorta. Enlargement of the pulmonary outflow tract/main pulmonary arteries suggests pulmonary arterial hypertension. Mediastinum/Nodes: No mediastinal lymphadenopathy. No evidence for gross hilar lymphadenopathy although assessment is limited by the lack of intravenous contrast on the current study. The esophagus has normal imaging features. There is no axillary lymphadenopathy. Lungs/Pleura: Centrilobular and paraseptal emphysema evident. Complete collapse of the right middle lobe noted with no discernible central mass lesion on noncontrast CT imaging. Scattered tiny bilateral pulmonary nodules measure up to maximum volume derived equivalent diameter of 3.9 mm. No suspicious pulmonary nodule or mass. No focal airspace consolidation. No pleural effusion. Upper Abdomen: Visualized portion of the upper abdomen is unremarkable. Musculoskeletal: No worrisome lytic or sclerotic osseous abnormality. IMPRESSION: Lung-RADS 2, benign appearance or behavior. Continue annual screening with low-dose chest CT without contrast in 12 months. Complete right middle lobe collapse. Technically, this precludes right middle lobe from lung cancer screening. Consider referral to pulmonology. If the patient has prior outside CT imaging of the chest, comparison to those older studies would be helpful to assess chronicity. Aortic Atherosclerosis (ICD10-I70.0) and Emphysema (ICD10-J43.9). Electronically Signed   By: Kennith Center M.D.   On: 10/20/2022 14:58    Labs: CBC: Recent Labs  Lab 10/23/22 0437 10/24/22 0530 10/25/22 0458  WBC 10.9* 11.5* 11.4*  HGB 12.1 11.6* 11.1*  HCT 34.6* 32.7* 32.7*  MCV 96.4 95.1 99.1  PLT 281 260 278   Basic Metabolic Panel: Recent Labs  Lab 10/22/22 2021 10/23/22 0104 10/23/22 0437 10/24/22 0528  10/24/22 0530 10/25/22 0458  NA 126* 127* 128*  --  129* 134*  K  --   --  3.8  --  3.1* 4.2  CL  --   --  96*  --  96* 102  CO2  --   --  26  --  26 26  GLUCOSE  --   --  92  --  99 90  BUN  --   --  14  --  14 17  CREATININE  --   --  0.74  --  0.68 0.67  CALCIUM  --   --  8.7*  --  8.6* 8.4*  MG  --   --   --  1.7  --   --    Liver Function Tests: Recent Labs  Lab 10/23/22 0437 10/24/22 0530  AST 38 29  ALT 23 21  ALKPHOS 47 44  BILITOT 0.9 0.5  PROT 6.0* 5.8*  ALBUMIN 3.3* 3.3*   Discharge time spent:  greater than 30 minutes.  Signed: Enedina Finner, MD [facility] 10/29/2022

## 2022-10-29 NOTE — TOC Transition Note (Signed)
Transition of Care [facility]) - CM/SW Discharge Note   Patient Details  Name: Lynn Norton MRN: [id number] Date of Birth: 1951-01-09  Transition of Care [facility]) CM/SW Contact:  Garret Reddish, RN Phone Number: 10/29/2022, 12:00 PM   Clinical Narrative:   Chart reviewed.  Noted order for discharge today.    I have informed Tanya with [facility] that patient will be a discharge for today.  I have sent Tanya patient's Discharge Summary, SNF Transfer Report, and Discharge Orders.  Kenney Houseman reports that patient will go to room 8B and the number to call report is [phone].  I have informed patient that [facility] can accept her today.  I have informed Mrs. Kitch that [facility] will transport her to the facility today.    I have arranged EMS transport to the facility today via [facility].    I have informed staff nurse of the above information.       Final next level of care: Skilled Nursing Facility Barriers to Discharge: No Barriers Identified   Patient Goals and CMS Choice CMS Medicare.gov Compare Post Acute Care list provided to:: Patient Choice offered to / list presented to : Patient  Discharge Placement                Patient chooses bed at: [facility] Patient to be transferred to facility by: [facility]   Patient and family notified of of transfer: 10/29/22  Discharge Plan and Services Additional resources added to the After Visit Summary for                                       Social Determinants of Health (SDOH) Interventions SDOH Screenings   Food Insecurity: No Food Insecurity (10/22/2022)  Housing: Low Risk  (10/22/2022)  Transportation Needs: Unmet Transportation Needs (10/22/2022)  Utilities: Not At Risk (10/22/2022)  Financial Resource Strain: Low Risk  (10/12/2022)   Received from [facility]  Tobacco Use: High Risk (10/27/2022)     Readmission Risk  Interventions     No data to display

## 2022-10-29 NOTE — Progress Notes (Signed)
Occupational Therapy Treatment Patient Details Name: Lynn Norton MRN: [id number] DOB: February 17, 1951 Today's Date: 10/29/2022   History of present illness Pt admitted to [facility] on 10/22/22 under observation for c/o mechanical fall at home secondary to ETOH vs. Tripping over her cat. C/o posterior scalp discomfort with dried blood noted. Recent admission in July for similar complaints. Dx with recurrent falls in the setting of encephalopathy, alcohol abuse, hyponatremia, and dehydration. Significant PMH includes: CKD, hypertension, rheumatoid arthritis, alcohol abuse presenting with fall, hyponatremia, alcohol abuse. Imaging negative for acute infarct/fracture, but does note Nonobstructive right nephrolithiasis and Stable 3 mm right upper lobe nodule. Patient is now s/p R wrist ORIF, NWB   OT comments  Upon entering the room, pt supine in bed and agreeable to OT intervention. Pt appears to be more cognitively intact this session than last. Pt still needing cuing to remain NWB when getting out of bed and needing min A for trunk support. Pt stands and takes several steps to commode chair with min HHA. Pt changing hospital gown with min A and transferring to commode to void. Pt performs hygiene while seated and then stands to return to bed in same manner as above. Call bell and all needed items within reach upon exiting the room.      If plan is discharge home, recommend the following:  A little help with walking and/or transfers;A lot of help with bathing/dressing/bathroom;Assistance with cooking/housework;Assist for transportation;Help with stairs or ramp for entrance;Direct supervision/assist for medications management   Equipment Recommendations  BSC/3in1       Precautions / Restrictions Precautions Precautions: Fall Required Braces or Orthoses: Splint/Cast Splint/Cast: L UE Restrictions Weight Bearing Restrictions: Yes LUE Weight Bearing: Non weight bearing       Mobility Bed  Mobility Overal bed mobility: Needs Assistance Bed Mobility: Supine to Sit, Sit to Supine     Supine to sit: Supervision Sit to supine: Supervision   General bed mobility comments: cues for NWB R UE    Transfers Overall transfer level: Needs assistance Equipment used: 1 person hand held assist Transfers: Sit to/from Stand Sit to Stand: Min assist                 Balance Overall balance assessment: Needs assistance Sitting-balance support: Feet supported Sitting balance-Leahy Scale: Good     Standing balance support: Single extremity supported, During functional activity Standing balance-Leahy Scale: Fair                             ADL either performed or assessed with clinical judgement   ADL Overall ADL's : Needs assistance/impaired                         Toilet Transfer: Minimal assistance;BSC/3in1;Ambulation   Toileting- Clothing Manipulation and Hygiene: Minimal assistance;Sit to/from stand               Vision Patient Visual Report: No change from baseline            Cognition Arousal: Alert Behavior During Therapy: WFL for tasks assessed/performed Overall Cognitive Status: Within Functional Limits for tasks assessed                                                     Pertinent Vitals/  Pain       Pain Assessment Pain Assessment: No/denies pain         Frequency  Min 1X/week        Progress Toward Goals  OT Goals(current goals can now be found in the care plan section)  Progress towards OT goals: Progressing toward goals      AM-PAC OT "6 Clicks" Daily Activity     Outcome Measure   Help from another person eating meals?: A Little Help from another person taking care of personal grooming?: A Little Help from another person toileting, which includes using toliet, bedpan, or urinal?: A Little Help from another person bathing (including washing, rinsing, drying)?: A Lot Help from  another person to put on and taking off regular upper body clothing?: A Lot Help from another person to put on and taking off regular lower body clothing?: A Lot 6 Click Score: 15    End of Session    OT Visit Diagnosis: History of falling (Z91.81);Muscle weakness (generalized) (M62.81);Unsteadiness on feet (R26.81)   Activity Tolerance Patient limited by fatigue   Patient Left in bed;with call bell/phone within reach;with bed alarm set;with family/visitor present   Nurse Communication Mobility status        Time: 2440-1027 OT Time Calculation (min): 14 min  Charges: OT General Charges $OT Visit: 1 Visit OT Treatments $Self Care/Home Management : 8-22 mins  Jackquline Denmark, MS, OTR/L , CBIS ascom [phone]  10/29/22, 12:13 PM

## 2022-10-29 NOTE — Care Management Important Message (Signed)
Important Message  Patient Details  Name: Lynn Norton MRN: [id number] Date of Birth: 11-13-1950   Medicare Important Message Given:  Yes     Olegario Messier A Cherisse Carrell 10/29/2022, 11:22 AM

## 2022-11-01 ENCOUNTER — Encounter: Payer: Self-pay | Admitting: Surgery

## 2022-11-01 NOTE — Group Note (Deleted)

## 2022-12-01 NOTE — Telephone Encounter (Signed)
Lynn Norton, pt was hospitalized from 10/22/22 - 10/29/22 with another CT chest while in hospital with mention of the stable RML atelectasis. Patient had HFU with PCP. We have no been able to reach the pt to review the LDCT results and recommendations. Please advise if you would still like to have pulm referral to [facility].

## 2022-12-06 ENCOUNTER — Encounter: Payer: Self-pay | Admitting: Emergency Medicine

## 2022-12-06 NOTE — Telephone Encounter (Signed)
Letter printed and mailed to patient. Referral placed to [facility]. Annual scan order placed.

## 2022-12-06 NOTE — Addendum Note (Signed)
Addended by: Sheran Luz on: 12/06/2022 10:06 AM   Modules accepted: Orders

## 2023-01-17 IMAGING — US US RENAL
1 series · 15 of 25 positions shown · non-contrast
Comparison: None.

CLINICAL DATA: Acute kidney injury

EXAM:
RENAL / URINARY TRACT ULTRASOUND COMPLETE

[Series 1: us renal · 15 of 42 slices shown]
[im 1/42]
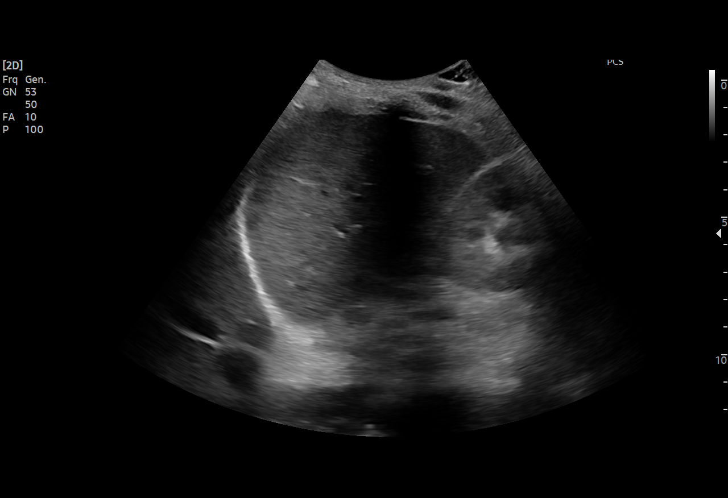
[im 4/42]
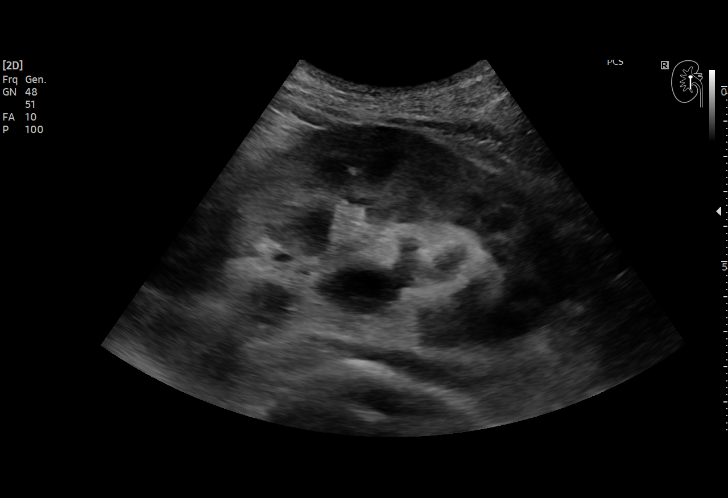
[im 7/42]
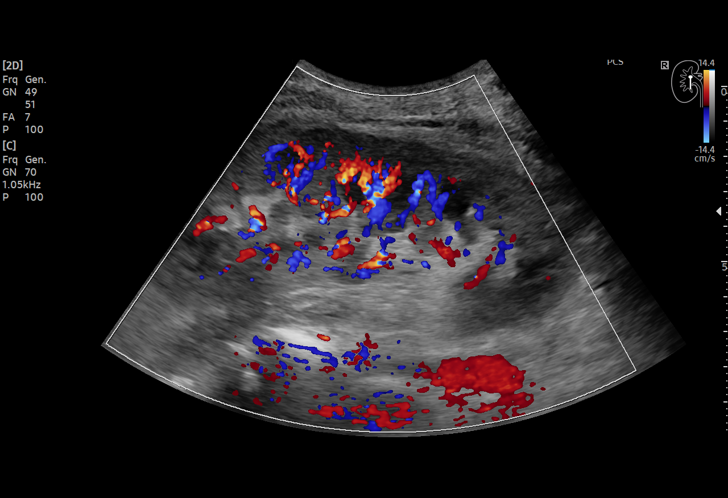
[im 9/42]
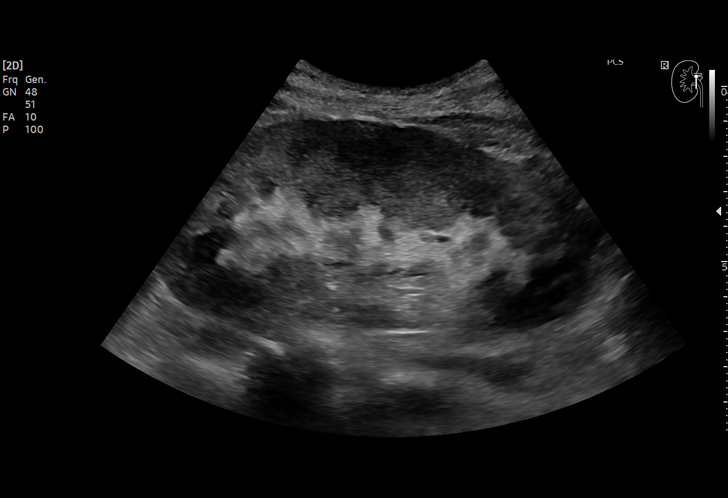
[im 12/42]
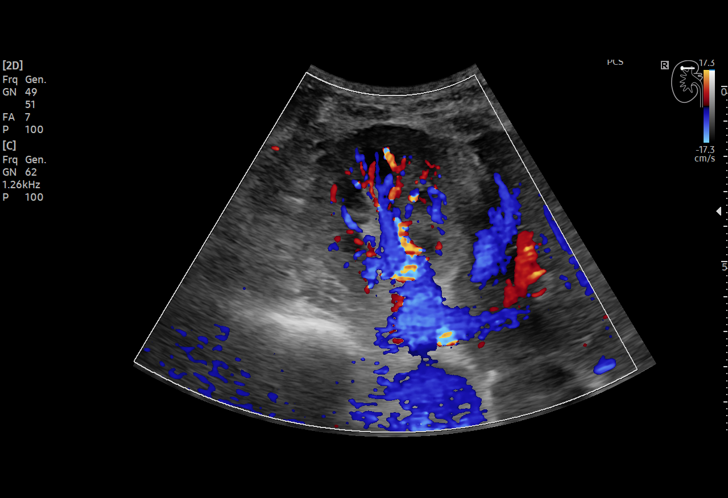
[im 16/42]
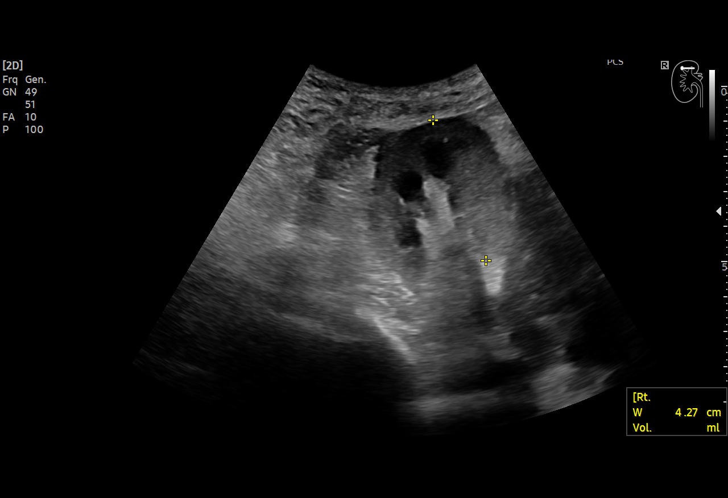
[im 18/42]
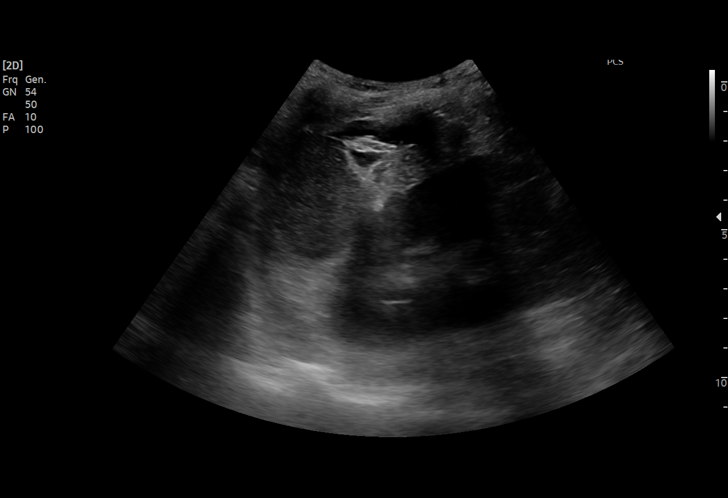
[im 21/42]
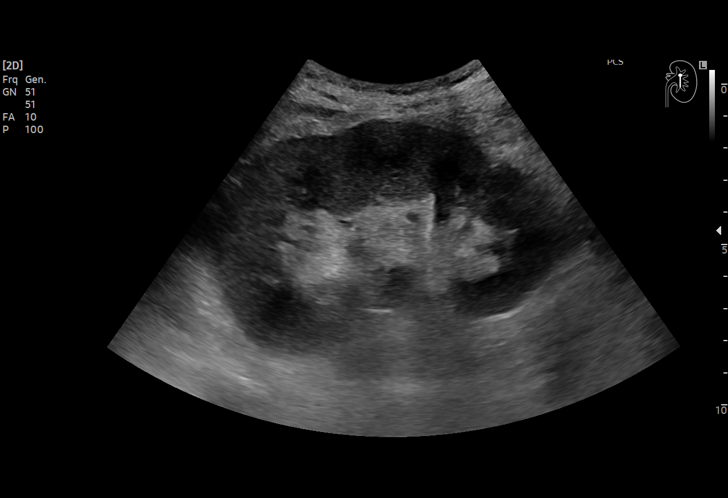
[im 24/42]
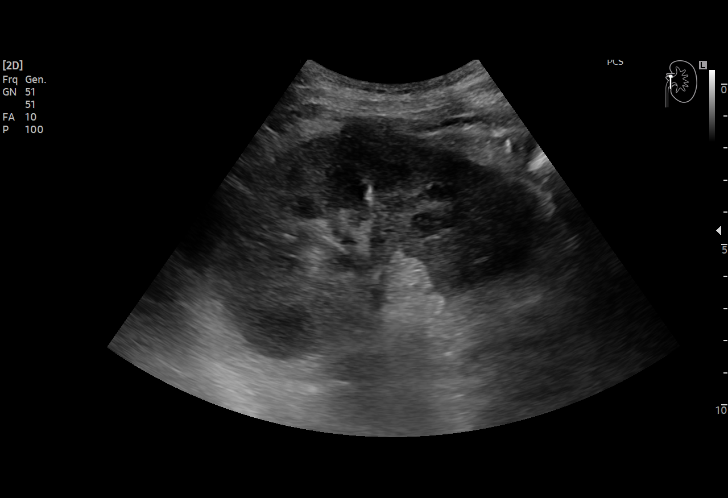
[im 26/42]
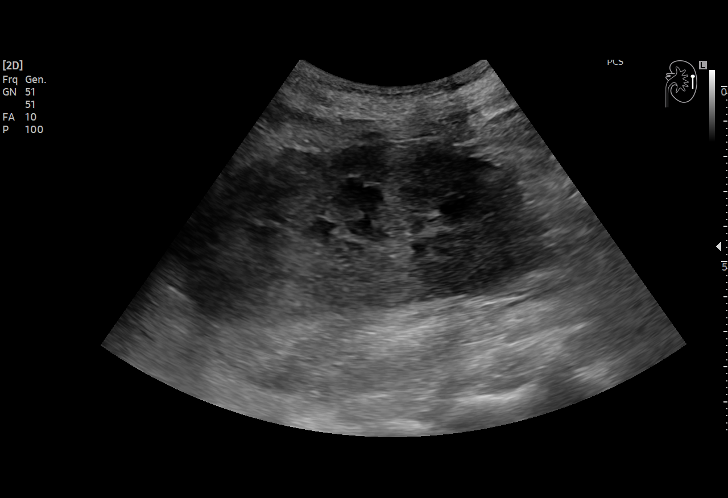
[im 30/42]
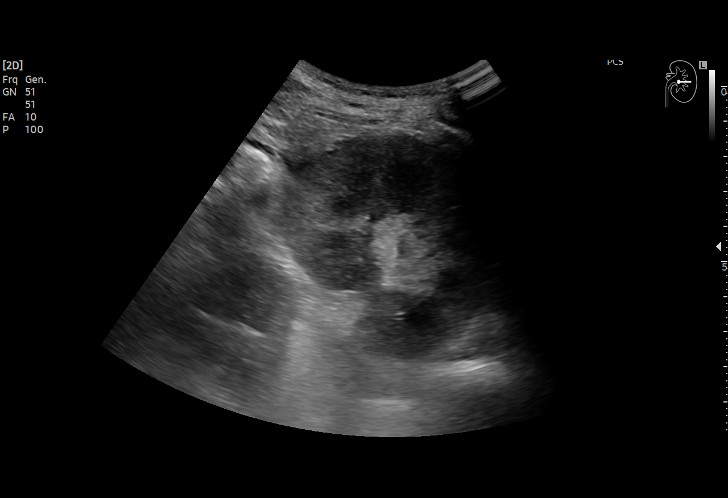
[im 33/42]
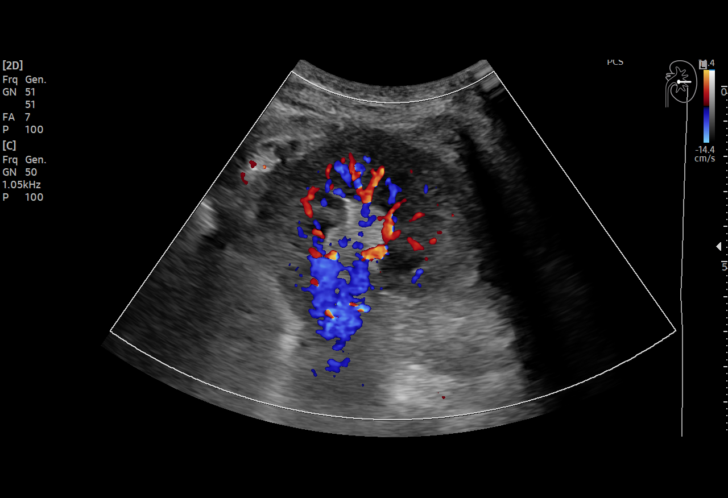
[im 35/42]
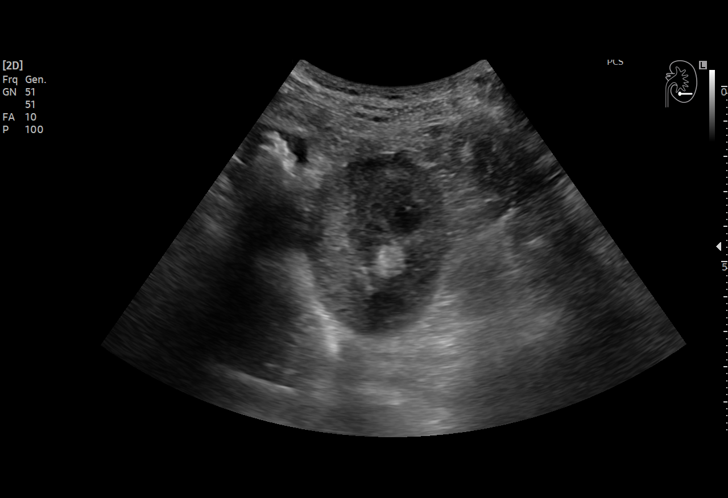
[im 38/42]
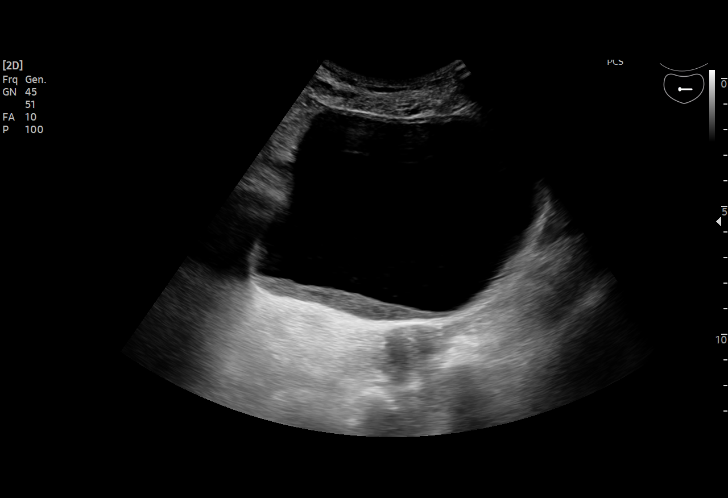
[im 42/42]
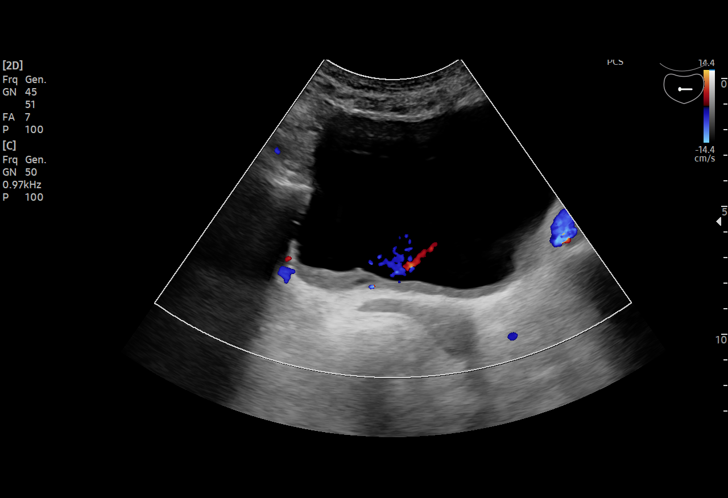

[15 of 25 positions shown; findings below may reference images not displayed]

FINDINGS: Right Kidney:

Renal measurements: 11.8 x 5.9 x 4.3 cm = volume: 155 mL.
Echogenicity within normal limits. No mass or hydronephrosis
visualized. Right extrarenal pelvis.

Left Kidney:

Renal measurements: 10.6 x 6 x 5.5 cm = volume: 185 mL. Echogenicity
within normal limits. No mass or hydronephrosis visualized.

Bladder:

No bladder wall thickening. Debris dependently along the bladder
base.

Other:

None.
IMPRESSION: 1. Normal renal ultrasound.

## 2023-02-25 ENCOUNTER — Other Ambulatory Visit: Payer: Self-pay | Admitting: Family Medicine

## 2023-02-25 DIAGNOSIS — R519 Headache, unspecified: Secondary | ICD-10-CM

## 2023-02-25 DIAGNOSIS — R296 Repeated falls: Secondary | ICD-10-CM

## 2023-03-04 ENCOUNTER — Ambulatory Visit: Payer: Medicare Other

## 2023-03-04 ENCOUNTER — Other Ambulatory Visit: Payer: Medicare Other

## 2023-03-08 NOTE — Progress Notes (Signed)
 Rheumatology Follow Up Note  Chief Complaint  Patient presents with  . Rheumatoid Arthritis      Subjective:HPI  Lynn Norton is a 73 y.o. female is here today for follow up of rheumatoid arthritis stage 2 moderate. The patient's allergies, current medications, past family history, past medical history, past social history, past surgical history and problem list were reviewed and updated as appropriate.   She is on methotrexate  and prednisone . She has noticed some pain of the wrist. She is able to form a fist but has poor grip. She is tolerating the medication well. She has no fever or infection or new skin rash, dyspnea.   She does have history of osteoporosis. She did fall and caused a laceration of the right wrist.   Review of Systems:   Review of Systems  Constitutional:  Positive for fatigue.  HENT:  Negative for mouth sores and trouble swallowing.        Neg: Dry Mouth  Eyes:  Negative for redness.       Neg: Dry Eyes  Respiratory:  Positive for cough. Negative for shortness of breath.   Cardiovascular:  Negative for chest pain and leg swelling.  Gastrointestinal:  Negative for constipation, diarrhea and nausea.  Endocrine: Positive for polydipsia. Negative for cold intolerance and heat intolerance.  Genitourinary:  Negative for hematuria.  Musculoskeletal:        Per HPI  Skin:  Negative for color change and rash.  Neurological:  Negative for dizziness, weakness, numbness and headaches.  Hematological:  Does not bruise/bleed easily.  Psychiatric/Behavioral:  Positive for sleep disturbance. Negative for dysphoric mood. The patient is nervous/anxious.   All other systems reviewed and are negative.  Objective:  Vitals:   03/08/23 1517  BP: 118/72  Temp: 36.4 C (97.6 F)  TempSrc: Temporal  Weight: (!) 38.6 kg (85 lb)  Height: 157.5 cm (5' 2)  PainSc: 0-No pain    Length of Stiffness: One hour   GEN - Pleasant, No Apparent Distress, Thin  HEENT - normocephalic  and atraumatic. Conjunctiva Clear.  No Nasal/Oral Ulcer. Adequate Saliva Neck - supple with no adenopathy or thyromegaly.   C spine with full range of motion. Heart - regular rate and rhythm, No murmurs/gallops/rub, Nml S1S2 Lungs - clear to auscultation in all fields. Extremities - there is no cyanosis or edema. Neurological - alert and oriented.  Spine - no paraspinal tenderness; L spine tenderness Skin - no rashes observed MSK - The following joints were examined bilaterally: Hands, Wrists, Elbows, Shoulders, Metatarsals, Ankels, Knees and Hips; they were normal apart from what is noted.    100% Fist Formation, Poor Grip Strength DIP Enlargement  CMC Squaring, Tenderness Left 2nd MCP Enlargement Left Shoulder with stiffness and pain on movement  No Dactylitis No Tender Point ______________________________________________________________________ Labs/Imaging Reviewed in EMR Cr 0.8, AST 32, ALT 22 CBC Hgb 11.8, Hct 34.7 TSH 0.679 HgA1c 5.4 UA: No Protein or RBCs Pos: ANA, SSB, RF (15.2), Anti-CarP (21) Neg: dsDNA, Smith, SSA, RNP, Anti-CEP-1, AntiCCP, Anti-Sa  Neg: Hep B and C   --2014-- Neg: RF, AntiCCP, HLAB27 Synovial Fluid: CPPD   DEXA Scan (2021): Osteoporosis   Left Hip Xray (10/2021): Left hip which reveal mild degenerative arthritis with some joint space narrowing and osteophyte formation, subchondral cysts and sclerosis of the left hip. Additionally there are calcifications noted at the tip of the greater trochanter and at the posterior medial femoral neck. The right hip is noted to have moderate osteoarthritic changes  with joint space narrowing osteophyte formation sclerosis subchondral cyst formation..   CDAI is calculated as follows:   SDAI = SJC + TJC +PGA + EGA = 4 Where:   SJC = Swollen Joint Count TJC = Tender Joint Count PGA = Patient Global Assessment of Disease Activity EGA = Evaluator Global Assessment of Disease Activity   Interpretation <=2.8:  Remission >2.8 and <=10: Low Disease Activity >10 and <=22: Moderate Disease Activity >22: High Disease Activity     Assessment and Plan     Rheumatoid arthritis, stage 2 moderate: Stable  -- Diagnosed many years ago, previous patient of Dr.Syed -- Continue Methotrexate  20 mg weekly and Folic Acid  1 mg Daily -- Continue Prednisone  5 mg day    2. Pseudogout -- Crystal proven -- Continue to monitor    3. Osteoarthritis of multiple joints: Cause of her pains currently -- Continue with Tylenol  for pain control    4. Lumbar Spondylosis -- Followed by Physiatry    5. Long term use of immunosuppressive therapy -- Methotrexate  is discussed at length including risks, benefits, adverse effects, direction on use and need for monitoring on a routine basis with office visits and labs.  Literature regarding methotrexate  was provided to the patient.  Patient directed to call office if any adverse effect develops or if more questions arise.  In addition, issues addressed included mouth sores, skin rashes, gastrointestinal side effects, fever, pulmonary side effects, hair loss, and abnormal lab findings.  Discussed the use of folic acid  with methotrexate . -- Was On Prolia by Dr.Hande; but not lately due to cost  -- Reviewed  Labs   Diagnoses and all orders for this visit:  Rheumatoid arthritis of multiple sites with negative rheumatoid factor (CMS/HHS-HCC)  Pseudogout  Primary osteoarthritis involving multiple joints  Long-term use of immunosuppressant medication    Return in about 3 months (around 06/06/2023) for Routine Follow Up.   All new prescription medications, changes in current prescription dosages, and sample medications were discussed with the patient, including patient education, medication name, use, dosage, potential side effects, drug interactions, consequences of not using/taking, and special instructions.  Patient expressed understanding.  No barriers to adherence.   I  appreciate the opportunity to participate in the care of Lynn Norton. Please do not hesitate to contact me with any questions or concerns that may arise in regards to the patient's rheumatologic disease.    Attestation Statement:   I personally performed the service. (TP)  MAYUR LOREE BLANCH, MD

## 2023-05-04 ENCOUNTER — Ambulatory Visit
Admission: EM | Admit: 2023-05-04 | Discharge: 2023-05-04 | Disposition: A | Discharge status: Home / Self Care | Attending: Emergency Medicine | Admitting: Emergency Medicine

## 2023-05-04 ENCOUNTER — Ambulatory Visit (INDEPENDENT_AMBULATORY_CARE_PROVIDER_SITE_OTHER)

## 2023-05-04 DIAGNOSIS — M79632 Pain in left forearm: Secondary | ICD-10-CM | POA: Diagnosis not present

## 2023-05-04 NOTE — ED Triage Notes (Addendum)
 Patient to Urgent Care with complaints of left sided forearm pain after a ground level, mechanical fall this morning.  Denies hitting head or LOC.   Bruising present to various areas of left arm but points to forearm directly when asked to localize pain.

## 2023-05-04 NOTE — Discharge Instructions (Signed)
 The x-ray is negative for any new broken bones.  Follow-up with your primary care provider.

## 2023-05-04 NOTE — ED Provider Notes (Signed)
 Lynn Norton    CSN: [id number] Arrival date & time: 05/04/23  1835      History   Chief Complaint Chief Complaint  Patient presents with   Fall    HPI Lynn Norton is a 73 y.o. female.  Patient presents with left forearm pain after she fell earlier today.  She also fell a few days ago and has bruising on her left upper and lower arm.  She denies pain in her shoulder, upper arm, elbow, wrist, hand.  She localizes the pain to her left mid forearm.  She reports no loss of consciousness.  Her medical history includes rheumatoid arthritis.  Patient was driven here by a caregiver but she is unaccompanied in the room and is her own POA.  The history is provided by the patient and medical records.    Past Medical History:  Diagnosis Date   Arthritis    CKD (chronic kidney disease), stage IV (HCC)    HTN (hypertension)    RA (rheumatoid arthritis) (HCC)    Tobacco abuse     Patient Active Problem List   Diagnosis Date Noted   Acute hyponatremia 10/23/2022   Fall at home, initial encounter 10/22/2022   Encephalopathy 10/22/2022   Alcohol abuse 10/22/2022   UTI (urinary tract infection) 06/13/2021   CKD (chronic kidney disease) stage 4, GFR 15-29 ml/min (HCC) 06/13/2021   Protein-calorie malnutrition, moderate (HCC) 06/13/2021   Closed fracture of pubic ramus-left  06/13/2021   Lower back pain 06/13/2021   HTN (hypertension) 04/26/2021   Rheumatoid arthritis (HCC) 04/24/2021   Tobacco use 04/24/2021   Bacteremia due to Escherichia coli 04/23/2021   Sepsis (HCC) 04/22/2021   Closed fracture dislocation of right elbow 04/22/2021   AKI (acute kidney injury) (HCC) 04/22/2021   Hyponatremia 04/22/2021   Frequent falls 04/22/2021   Hypertension 03/16/2018   PAD (peripheral artery disease) (HCC) 03/16/2018   Tobacco use disorder 08/12/2014   Unspecified osteoarthritis, unspecified site 11/03/2011    Past Surgical History:  Procedure Laterality Date   ORIF ELBOW  FRACTURE Right 04/27/2021   Procedure: OPEN REDUCTION INTERNAL FIXATION (ORIF) ELBOW/OLECRANON FRACTURE;  Surgeon: Kennedy Bucker, MD;  Location: [facility] ORS;  Service: Orthopedics;  Laterality: Right;   ORIF WRIST FRACTURE Right 10/27/2022   Procedure: OPEN REDUCTION INTERNAL FIXATION (ORIF) WRIST FRACTURE;  Surgeon: Christena Flake, MD;  Location: [facility] ORS;  Service: Orthopedics;  Laterality: Right;    OB History   No obstetric history on file.      Home Medications    Prior to Admission medications   Medication Sig Start Date End Date Taking? Authorizing Provider  albuterol (VENTOLIN HFA) 108 (90 Base) MCG/ACT inhaler Inhale 1-2 puffs into the lungs 4 (four) times daily as needed for wheezing or shortness of breath.    [provider]  amLODipine (NORVASC) 10 MG tablet Take 1 tablet (10 mg total) by mouth daily. 10/30/22   Enedina Finner, MD  docusate sodium (COLACE) 100 MG capsule Take 1 capsule (100 mg total) by mouth 2 (two) times daily. 10/29/22   Enedina Finner, MD  folic acid (FOLVITE) 1 MG tablet Take 1 mg by mouth daily.    [provider]  HYDROcodone-acetaminophen (NORCO/VICODIN) 5-325 MG tablet Take 1 tablet by mouth every 6 (six) hours as needed for severe pain. 10/28/22   Anson Oregon, PA-C  methotrexate (RHEUMATREX) 2.5 MG tablet Take 20 mg by mouth every Thursday.    [provider]  mirtazapine (REMERON) 7.5 MG  tablet Take 7.5 mg by mouth at bedtime.    [provider]  Multiple Vitamin (MULTIVITAMIN WITH MINERALS) TABS tablet Take 1 tablet by mouth daily. 10/30/22   Enedina Finner, MD  nicotine (NICODERM CQ - DOSED IN MG/24 HOURS) 21 mg/24hr patch Place 1 patch (21 mg total) onto the skin daily. 10/30/22   Enedina Finner, MD  PARoxetine (PAXIL) 10 MG tablet Take 10 mg by mouth daily.    [provider]  predniSONE (DELTASONE) 5 MG tablet Take 5 mg by mouth daily.    [provider]  thiamine (VITAMIN B-1) 100 MG tablet Take 1 tablet (100 mg  total) by mouth daily. 10/30/22   Enedina Finner, MD    Family History Family History  Problem Relation Age of Onset   Heart attack Brother     Social History Social History   Tobacco Use   Smoking status: Every Day    Current packs/day: 1.00    Average packs/day: 1 pack/day for 50.0 years (50.0 ttl pk-yrs)    Types: Cigarettes   Smokeless tobacco: Never  Vaping Use   Vaping status: Never Used  Substance Use Topics   Alcohol use: Not Currently   Drug use: Never     Allergies   Sulfa antibiotics, Ceftriaxone, Penicillins, Rocephin [ceftriaxone], Sulfa antibiotics, and Penicillins   Review of Systems Review of Systems  Musculoskeletal:  Positive for arthralgias and joint swelling.       Joint pain and swelling at baseline due to RA.  Left forearm pain today due to fall.   Skin:  Positive for color change. Negative for wound.       Bruising on left arm.  Neurological:  Negative for weakness and numbness.     Physical Exam Triage Vital Signs ED Triage Vitals  Encounter Vitals Group     BP 05/04/23 1850 135/76     Systolic BP Percentile --      Diastolic BP Percentile --      Pulse Rate 05/04/23 1850 99     Resp 05/04/23 1850 18     Temp 05/04/23 1850 97.6 F (36.4 C)     Temp src --      SpO2 05/04/23 1850 95 %     Weight --      Height --      Head Circumference --      Peak Flow --      Pain Score 05/04/23 1847 4     Pain Loc --      Pain Education --      Exclude from Growth Chart --    No data found.  Updated Vital Signs BP 135/76   Pulse 99   Temp 97.6 F (36.4 C)   Resp 18   SpO2 95%   Visual Acuity Right Eye Distance:   Left Eye Distance:   Bilateral Distance:    Right Eye Near:   Left Eye Near:    Bilateral Near:     Physical Exam Constitutional:      General: She is not in acute distress. HENT:     Mouth/Throat:     Mouth: Mucous membranes are moist.  Cardiovascular:     Rate and Rhythm: Normal rate and regular rhythm.     Heart  sounds: Normal heart sounds.  Pulmonary:     Effort: Pulmonary effort is normal. No respiratory distress.     Breath sounds: Normal breath sounds.  Musculoskeletal:  General: Tenderness present. No deformity.     Comments: No tenderness to palpation or movement of left shoulder, upper arm, elbow, wrist, hand.  Left forearm mildly tender to palpation.  Skin:    General: Skin is warm and dry.     Findings: Bruising present.     Comments: Ecchymosis on left upper arm and left forearm.  Neurological:     Mental Status: She is alert.      UC Treatments / Results  Labs (all labs ordered are listed, but only abnormal results are displayed) Labs Reviewed - No data to display  EKG   Radiology No results found.  Procedures Procedures (including critical care time)  Medications Ordered in UC Medications - No data to display  Initial Impression / Assessment and Plan / UC Course  I have reviewed the triage vital signs and the nursing notes.  Pertinent labs & imaging results that were available during my care of the patient were reviewed by me and considered in my medical decision making (see chart for details).    Left forearm pain.  Discussed with radiologist, Dr.  And x-ray shows healing old fracture but no new acute fracture.  Tylenol as needed.  Rest, elevation, ice.  Instructed patient to follow-up with her PCP.  She agrees to plan of care.  Final Clinical Impressions(s) / UC Diagnoses   Final diagnoses:  Left forearm pain     Discharge Instructions      The x-ray is negative for any new broken bones.  Follow-up with your primary care provider.     ED Prescriptions   None    PDMP not reviewed this encounter.

## 2023-06-23 ENCOUNTER — Emergency Department

## 2023-06-23 ENCOUNTER — Other Ambulatory Visit: Payer: Self-pay

## 2023-06-23 ENCOUNTER — Emergency Department
Admission: EM | Admit: 2023-06-23 | Discharge: 2023-06-23 | Disposition: A | Discharge status: Home / Self Care | Attending: Emergency Medicine | Admitting: Emergency Medicine

## 2023-06-23 DIAGNOSIS — N184 Chronic kidney disease, stage 4 (severe): Secondary | ICD-10-CM | POA: Diagnosis not present

## 2023-06-23 DIAGNOSIS — W19XXXA Unspecified fall, initial encounter: Secondary | ICD-10-CM

## 2023-06-23 DIAGNOSIS — S41112A Laceration without foreign body of left upper arm, initial encounter: Secondary | ICD-10-CM | POA: Insufficient documentation

## 2023-06-23 DIAGNOSIS — I129 Hypertensive chronic kidney disease with stage 1 through stage 4 chronic kidney disease, or unspecified chronic kidney disease: Secondary | ICD-10-CM | POA: Diagnosis not present

## 2023-06-23 DIAGNOSIS — Z23 Encounter for immunization: Secondary | ICD-10-CM | POA: Insufficient documentation

## 2023-06-23 DIAGNOSIS — Z79899 Other long term (current) drug therapy: Secondary | ICD-10-CM | POA: Diagnosis not present

## 2023-06-23 MED ORDER — TETANUS-DIPHTH-ACELL PERTUSSIS 5-2.5-18.5 LF-MCG/0.5 IM SUSY
0.5000 mL | PREFILLED_SYRINGE | Freq: Once | INTRAMUSCULAR | Status: AC
  Administered 2023-06-23: 0.5 mL via INTRAMUSCULAR
  Filled 2023-06-23: qty 0.5

## 2023-06-23 MED ORDER — ONDANSETRON 4 MG PO TBDP
4.0000 mg | ORAL_TABLET | Freq: Once | ORAL | Status: AC
  Administered 2023-06-23: 4 mg via ORAL
  Filled 2023-06-23: qty 1

## 2023-06-23 MED ORDER — HYDROCODONE-ACETAMINOPHEN 5-325 MG PO TABS
1.0000 | ORAL_TABLET | Freq: Once | ORAL | Status: AC
  Administered 2023-06-23: 1 via ORAL
  Filled 2023-06-23: qty 1

## 2023-06-23 NOTE — Discharge Instructions (Addendum)
 You may take over-the-counter Tylenol  1000 mg every 6 hours as needed for pain.  Your x-ray showed no new fractures today.

## 2023-06-23 NOTE — ED Notes (Signed)
 Donovan Gallant, pt's emergency contact, for a ride. He verbalized an ETA of 1 hour.
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
## 2023-06-23 NOTE — ED Notes (Signed)
 Pt's skin tear on left upper arm cleaned, steri strips applied at wound edges where tolerated, dressed w/ xeroform, bandage gauze and ace wrap bandage. Pt transported to radiology following wound dressing.

## 2023-08-23 ENCOUNTER — Other Ambulatory Visit: Payer: Self-pay | Admitting: Internal Medicine

## 2023-08-23 DIAGNOSIS — R42 Dizziness and giddiness: Secondary | ICD-10-CM

## 2023-08-23 DIAGNOSIS — R2681 Unsteadiness on feet: Secondary | ICD-10-CM

## 2023-09-01 ENCOUNTER — Encounter: Payer: Self-pay | Admitting: Internal Medicine

## 2023-09-03 ENCOUNTER — Telehealth: Payer: Self-pay

## 2023-09-03 NOTE — Telephone Encounter (Signed)
 Confirmed upcoming MRI for 09-05-2023.

## 2023-09-04 ENCOUNTER — Emergency Department

## 2023-09-04 ENCOUNTER — Emergency Department
Admission: EM | Admit: 2023-09-04 | Discharge: 2023-09-04 | Disposition: A | Discharge status: Home / Self Care | Attending: Emergency Medicine | Admitting: Emergency Medicine

## 2023-09-04 ENCOUNTER — Other Ambulatory Visit: Payer: Self-pay

## 2023-09-04 DIAGNOSIS — I129 Hypertensive chronic kidney disease with stage 1 through stage 4 chronic kidney disease, or unspecified chronic kidney disease: Secondary | ICD-10-CM | POA: Diagnosis not present

## 2023-09-04 DIAGNOSIS — S6992XA Unspecified injury of left wrist, hand and finger(s), initial encounter: Secondary | ICD-10-CM | POA: Diagnosis present

## 2023-09-04 DIAGNOSIS — M79645 Pain in left finger(s): Secondary | ICD-10-CM | POA: Insufficient documentation

## 2023-09-04 DIAGNOSIS — W19XXXA Unspecified fall, initial encounter: Secondary | ICD-10-CM | POA: Diagnosis not present

## 2023-09-04 DIAGNOSIS — N189 Chronic kidney disease, unspecified: Secondary | ICD-10-CM | POA: Diagnosis not present

## 2023-09-04 DIAGNOSIS — S62643A Nondisplaced fracture of proximal phalanx of left middle finger, initial encounter for closed fracture: Secondary | ICD-10-CM | POA: Diagnosis not present

## 2023-09-04 DIAGNOSIS — Y92019 Unspecified place in single-family (private) house as the place of occurrence of the external cause: Secondary | ICD-10-CM | POA: Diagnosis not present

## 2023-09-04 DIAGNOSIS — Y92009 Unspecified place in unspecified non-institutional (private) residence as the place of occurrence of the external cause: Secondary | ICD-10-CM

## 2023-09-04 MED ORDER — ACETAMINOPHEN 500 MG PO TABS
500.0000 mg | ORAL_TABLET | Freq: Once | ORAL | Status: AC
  Administered 2023-09-04: 500 mg via ORAL
  Filled 2023-09-04: qty 1

## 2023-09-04 NOTE — ED Triage Notes (Signed)
 Pt comes with fall last night. Pt states 10/10 pain on left hand. Pt states denies any loc or hitting head. Pt did have fall two weeks ago. Pt denies any dizziness. VSS [id number]

## 2023-09-04 NOTE — ED Provider Notes (Signed)
 [facility] Provider Note    Event Date/Time   First MD Initiated Contact with Patient 09/04/23 1347     (approximate)   History   Fall   HPI  Lynn Norton is a 73 y.o. female   presents to the ED for evaluation after a fall that occurred last evening.  Family states that patient will use her cane occasionally but does not use her walker as she has been instructed to do so.  She has fallen several times in last month.  No head injury or loss of consciousness.  She complains of her left hand and specifically her third digit.  No over-the-counter medications been taken.  She denies any dizziness, headache, shortness of breath, difficulty breathing, nausea or vomiting.  Patient has a history of hypertension, rheumatoid arthritis, CKD, UTIs, osteoarthritis, and history of frequent falls.      Physical Exam   Triage Vital Signs: ED Triage Vitals  Encounter Vitals Group     BP 09/04/23 1309 (!) 140/83     Girls Systolic BP Percentile --      Girls Diastolic BP Percentile --      Boys Systolic BP Percentile --      Boys Diastolic BP Percentile --      Pulse Rate 09/04/23 1309 (!) 111     Resp 09/04/23 1309 17     Temp 09/04/23 1309 98 F (36.7 C)     Temp src --      SpO2 09/04/23 1309 97 %     Weight --      Height --      Head Circumference --      Peak Flow --      Pain Score 09/04/23 1250 10     Pain Loc --      Pain Education --      Exclude from Growth Chart --     Most recent vital signs: Vitals:   09/04/23 1309  BP: (!) 140/83  Pulse: (!) 111  Resp: 17  Temp: 98 F (36.7 C)  SpO2: 97%     General: Awake, no distress.  Alert, talkative, answers questions appropriately. CV:  Good peripheral perfusion.  Heart regular rate rhythm. Resp:  Normal effort.  Lungs clear bilaterally. Abd:  No distention.  Other:  Left hand third MP joint and proximal phalanx without deformity but there is soft tissue edema and ecchymosis present.   Patient is able to move digits in flexion and extension.  Skin is intact.  Capillary refill is equal in comparison with remaining digits and motor or sensory function intact.   ED Results / Procedures / Treatments   Labs (all labs ordered are listed, but only abnormal results are displayed) Labs Reviewed - No data to display   RADIOLOGY Left hand x-ray images reviewed and interpreted by myself independent of the radiologist and a minimally displaced fracture noted to the proximal phalanx third digit.  Official radiology report agrees and mentions probable intra articular extension into the third MCP.   PROCEDURES:  Critical Care performed:   Procedures   MEDICATIONS ORDERED IN ED: Medications  acetaminophen  (TYLENOL ) tablet 500 mg (500 mg Oral Given 09/04/23 1404)     IMPRESSION / MDM / ASSESSMENT AND PLAN / ED COURSE  I reviewed the triage vital signs and the nursing notes.   Differential diagnosis includes, but is not limited to, contusion left hand, fracture, dislocation, sprain, mechanical fall.  73 year old female presents to the  ED with complaint of left hand pain after a fall that occurred last evening.  This was a mechanical fall and patient's only complaint is her hand.  No over-the-counter medications been taken.  She was given Tylenol  while in the ED and we did discuss narcotics which may increase her risk for falling.  I stressed that it is important for her to use her cane or walker to prevent falls and she agreed that Tylenol  was a more suitable pain medication for her.  Splint was applied and patient with family are aware that she should follow-up with orthopedics.  Ice and elevation and Tylenol  as needed for pain.      Patient's presentation is most consistent with acute complicated illness / injury requiring diagnostic workup.  FINAL CLINICAL IMPRESSION(S) / ED DIAGNOSES   Final diagnoses:  Nondisplaced fracture of proximal phalanx of left middle finger,  initial encounter for closed fracture  Fall in home, initial encounter     Rx / DC Orders   ED Discharge Orders     None        Note:  This document was prepared using Dragon voice recognition software and may include unintentional dictation errors.   Saunders Shona CROME, PA-C 09/04/23 1416    Malvina Alm DASEN, MD 09/04/23 [phone]

## 2023-09-04 NOTE — Discharge Instructions (Addendum)
 Call tomorrow to make an appointment for follow-up with Dr. Lorelle, who is on-call for orthopedics.  Ice and elevation if needed for pain and swelling.  Wear the splint for support and protection.  You should continue wearing splint until you are seen by orthopedics.  Tylenol  every 6-8 hours as needed for pain.  Make sure you are using your cane or walker for added support to help prevent falls.

## 2023-09-05 ENCOUNTER — Ambulatory Visit
Admission: RE | Admit: 2023-09-05 | Discharge: 2023-09-05 | Disposition: A | Discharge status: Home / Self Care | Source: Ambulatory Visit | Attending: Internal Medicine | Admitting: Internal Medicine

## 2023-09-05 DIAGNOSIS — R2681 Unsteadiness on feet: Secondary | ICD-10-CM

## 2023-09-05 DIAGNOSIS — R42 Dizziness and giddiness: Secondary | ICD-10-CM

## 2023-10-10 ENCOUNTER — Emergency Department

## 2023-10-10 ENCOUNTER — Other Ambulatory Visit: Payer: Self-pay

## 2023-10-10 ENCOUNTER — Observation Stay: Admission: EM | Admit: 2023-10-10 | Discharge: 2023-10-17 | Disposition: A | Discharge status: Home / Self Care

## 2023-10-10 DIAGNOSIS — E871 Hypo-osmolality and hyponatremia: Secondary | ICD-10-CM | POA: Diagnosis present

## 2023-10-10 DIAGNOSIS — M069 Rheumatoid arthritis, unspecified: Secondary | ICD-10-CM | POA: Diagnosis not present

## 2023-10-10 DIAGNOSIS — R296 Repeated falls: Secondary | ICD-10-CM

## 2023-10-10 DIAGNOSIS — I10 Essential (primary) hypertension: Secondary | ICD-10-CM | POA: Diagnosis present

## 2023-10-10 DIAGNOSIS — F17210 Nicotine dependence, cigarettes, uncomplicated: Secondary | ICD-10-CM | POA: Diagnosis not present

## 2023-10-10 DIAGNOSIS — E43 Unspecified severe protein-calorie malnutrition: Secondary | ICD-10-CM | POA: Diagnosis present

## 2023-10-10 DIAGNOSIS — R42 Dizziness and giddiness: Secondary | ICD-10-CM | POA: Diagnosis present

## 2023-10-10 DIAGNOSIS — N184 Chronic kidney disease, stage 4 (severe): Secondary | ICD-10-CM | POA: Diagnosis not present

## 2023-10-10 DIAGNOSIS — W19XXXA Unspecified fall, initial encounter: Secondary | ICD-10-CM | POA: Insufficient documentation

## 2023-10-10 DIAGNOSIS — Z720 Tobacco use: Secondary | ICD-10-CM | POA: Diagnosis present

## 2023-10-10 DIAGNOSIS — R2681 Unsteadiness on feet: Secondary | ICD-10-CM

## 2023-10-10 DIAGNOSIS — I129 Hypertensive chronic kidney disease with stage 1 through stage 4 chronic kidney disease, or unspecified chronic kidney disease: Secondary | ICD-10-CM | POA: Insufficient documentation

## 2023-10-10 DIAGNOSIS — S2249XA Multiple fractures of ribs, unspecified side, initial encounter for closed fracture: Principal | ICD-10-CM | POA: Insufficient documentation

## 2023-10-10 DIAGNOSIS — S2239XA Fracture of one rib, unspecified side, initial encounter for closed fracture: Secondary | ICD-10-CM | POA: Diagnosis present

## 2023-10-10 LAB — CBC
HCT: 38 % (ref 36.0–46.0)
Hemoglobin: 12.8 g/dL (ref 12.0–15.0)
MCH: 32.2 pg (ref 26.0–34.0)
MCHC: 33.7 g/dL (ref 30.0–36.0)
MCV: 95.5 fL (ref 80.0–100.0)
Platelets: 360 K/uL (ref 150–400)
RBC: 3.98 MIL/uL (ref 3.87–5.11)
RDW: 16.8 % — ABNORMAL HIGH (ref 11.5–15.5)
WBC: 10.9 K/uL — ABNORMAL HIGH (ref 4.0–10.5)
nRBC: 0 % (ref 0.0–0.2)

## 2023-10-10 LAB — URINALYSIS, ROUTINE W REFLEX MICROSCOPIC
Bilirubin Urine: NEGATIVE
Glucose, UA: NEGATIVE mg/dL
Hgb urine dipstick: NEGATIVE
Ketones, ur: NEGATIVE mg/dL
Leukocytes,Ua: NEGATIVE
Nitrite: NEGATIVE
Protein, ur: NEGATIVE mg/dL
Specific Gravity, Urine: 1.006 (ref 1.005–1.030)
pH: 7 (ref 5.0–8.0)

## 2023-10-10 LAB — BASIC METABOLIC PANEL WITH GFR
Anion gap: 13 (ref 5–15)
BUN: 13 mg/dL (ref 8–23)
CO2: 25 mmol/L (ref 22–32)
Calcium: 9 mg/dL (ref 8.9–10.3)
Chloride: 92 mmol/L — ABNORMAL LOW (ref 98–111)
Creatinine, Ser: 0.57 mg/dL (ref 0.44–1.00)
GFR, Estimated: >60 mL/min (ref >60)
Glucose, Bld: 89 mg/dL (ref 70–99)
Potassium: 4.3 mmol/L (ref 3.5–5.1)
Sodium: 130 mmol/L — ABNORMAL LOW (ref 135–145)

## 2023-10-10 MED ORDER — THIAMINE MONONITRATE 100 MG PO TABS
100.0000 mg | ORAL_TABLET | Freq: Every day | ORAL | Status: DC
  Administered 2023-10-10 – 2023-10-17 (×8): 100 mg via ORAL
  Filled 2023-10-10 (×8): qty 1

## 2023-10-10 MED ORDER — PAROXETINE HCL 10 MG PO TABS
10.0000 mg | ORAL_TABLET | Freq: Every day | ORAL | Status: DC
  Administered 2023-10-11 – 2023-10-17 (×7): 10 mg via ORAL
  Filled 2023-10-10 (×7): qty 1

## 2023-10-10 MED ORDER — SODIUM CHLORIDE 0.9 % IV BOLUS
500.0000 mL | Freq: Once | INTRAVENOUS | Status: AC
  Administered 2023-10-10: 500 mL via INTRAVENOUS

## 2023-10-10 MED ORDER — AMLODIPINE BESYLATE 10 MG PO TABS
10.0000 mg | ORAL_TABLET | Freq: Every day | ORAL | Status: DC
  Administered 2023-10-10 – 2023-10-17 (×8): 10 mg via ORAL
  Filled 2023-10-10 (×6): qty 1
  Filled 2023-10-10: qty 2
  Filled 2023-10-10: qty 1

## 2023-10-10 MED ORDER — ACETAMINOPHEN 325 MG PO TABS
650.0000 mg | ORAL_TABLET | Freq: Once | ORAL | Status: AC
  Administered 2023-10-10: 650 mg via ORAL
  Filled 2023-10-10: qty 2

## 2023-10-10 MED ORDER — ONDANSETRON HCL 4 MG PO TABS
4.0000 mg | ORAL_TABLET | Freq: Three times a day (TID) | ORAL | Status: DC | PRN
Start: 2023-10-10 — End: 2023-10-17

## 2023-10-10 MED ORDER — DOCUSATE SODIUM 100 MG PO CAPS
100.0000 mg | ORAL_CAPSULE | Freq: Two times a day (BID) | ORAL | Status: DC
  Administered 2023-10-10 – 2023-10-17 (×11): 100 mg via ORAL
  Filled 2023-10-10 (×13): qty 1

## 2023-10-10 MED ORDER — SODIUM CHLORIDE 0.9 % IV SOLN: INTRAVENOUS | Status: DC

## 2023-10-10 MED ORDER — ALBUTEROL SULFATE (2.5 MG/3ML) 0.083% IN NEBU: 3.0000 mL | INHALATION_SOLUTION | Freq: Four times a day (QID) | RESPIRATORY_TRACT | Status: DC | PRN

## 2023-10-10 MED ORDER — ADULT MULTIVITAMIN W/MINERALS CH
1.0000 | ORAL_TABLET | Freq: Every day | ORAL | Status: DC
  Administered 2023-10-10 – 2023-10-17 (×8): 1 via ORAL
  Filled 2023-10-10 (×8): qty 1

## 2023-10-10 MED ORDER — IBUPROFEN 400 MG PO TABS
600.0000 mg | ORAL_TABLET | Freq: Three times a day (TID) | ORAL | Status: DC | PRN
  Administered 2023-10-11: 600 mg via ORAL
  Filled 2023-10-10 (×2): qty 2

## 2023-10-10 MED ORDER — ALUM & MAG HYDROXIDE-SIMETH 200-200-20 MG/5ML PO SUSP: 30.0000 mL | Freq: Four times a day (QID) | ORAL | Status: DC | PRN

## 2023-10-10 NOTE — ED Provider Notes (Signed)
 [facility] Provider Note    Event Date/Time   First MD Initiated Contact with Patient 10/10/23 1638     (approximate)   History   Fall   HPI  Lynn Norton is a 73 y.o. female  pmh of RA, Sjogren's syndrome, HTN and Tobacco use who presents to the emergency department with 1 week of progressively worsening dizziness, gait instability and falls.  Patient states that she normally feels comfortable ambulating (albeit does not use her walker as she should) but for the past week she has been lightheaded and dizzy all the time.  She denies any hearing or vision changes, chest pain, shortness of breath abdominal pain changes in urinary or bowel habits      Physical Exam   Triage Vital Signs: ED Triage Vitals  Encounter Vitals Group     BP 10/10/23 1314 131/69     Girls Systolic BP Percentile --      Girls Diastolic BP Percentile --      Boys Systolic BP Percentile --      Boys Diastolic BP Percentile --      Pulse Rate 10/10/23 1314 86     Resp 10/10/23 1314 18     Temp 10/10/23 1314 98 F (36.7 C)     Temp src --      SpO2 10/10/23 1314 100 %     Weight --      Height --      Head Circumference --      Peak Flow --      Pain Score 10/10/23 1305 0     Pain Loc --      Pain Education --      Exclude from Growth Chart --     Most recent vital signs: Vitals:   10/10/23 2052 10/10/23 2218  BP: (!) 146/72 (!) 152/83  Pulse: 89 85  Resp: 17 16  Temp: 98.3 F (36.8 C) 98 F (36.7 C)  SpO2: 96% 98%    Nursing Triage Note reviewed. Vital signs reviewed and patients oxygen saturation is normoxic  General: Patient is well nourished, well developed, awake and alert, resting comfortably in no acute distress Head: Normocephalic, there is forehead ecchymosis and periorbital ecchymosis Eyes: Normal inspection, extraocular muscles intact, no conjunctival pallor Ear, nose, throat: Normal external exam Neck: Normal range of motion Respiratory:  Patient is in no respiratory distress, lungs CTAB Cardiovascular: Patient is not tachycardic, RRR without murmur appreciated GI: Abd SNT with no guarding or rebound  Back: Normal inspection of the back with good strength and range of motion throughout all ext No CT or L-spine tenderness to palpation.   Extremities: pulses intact with good cap refills, no LE pitting edema or calf tenderness Patient is able to straight leg lift bilaterally Neuro: The patient is alert and oriented to person, place, and time, appropriately conversive, with 5/5 bilat UE/LE strength, no gross motor or sensory defects noted.  Patient has unsteady gait Skin: Warm, dry, and intact Psych: normal mood and affect, no SI or HI  ED Results / Procedures / Treatments   Labs (all labs ordered are listed, but only abnormal results are displayed) Labs Reviewed  CBC - Abnormal; Notable for the following components:      Result Value   WBC 10.9 (*)    RDW 16.8 (*)    All other components within normal limits  BASIC METABOLIC PANEL WITH GFR - Abnormal; Notable for the following components:   Sodium  130 (*)    Chloride 92 (*)    All other components within normal limits  URINALYSIS, ROUTINE W REFLEX MICROSCOPIC - Abnormal; Notable for the following components:   Color, Urine YELLOW (*)    APPearance CLEAR (*)    All other components within normal limits     EKG EKG and rhythm strip are interpreted by myself:   Very poor baseline  EKG: [Normal sinus rhythm] at heart rate of 80, normal QRS duration, QTc 468, nonspecific ST segments and T waves no ectopy EKG not consistent with Acute STEMI Rhythm strip: NSR in lead II   RADIOLOGY CT head: No intracranial hemorrhage on my independent review and interpretation CT face: No facial fractures CT C-spine: No cervical fractures X-ray of chest:  Acute mildly displaced fractures  of the lateral right tenth rib and anterior left sixth rib and  lateral left tenth rib.    MRI brain:    PROCEDURES:  Critical Care performed: No  Procedures   MEDICATIONS ORDERED IN ED: Medications  albuterol  (PROVENTIL ) (2.5 MG/3ML) 0.083% nebulizer solution 3 mL (has no administration in time range)  amLODipine  (NORVASC ) tablet 10 mg (10 mg Oral Given 10/10/23 2226)  docusate sodium  (COLACE) capsule 100 mg (100 mg Oral Given 10/10/23 2226)  multivitamin with minerals tablet 1 tablet (1 tablet Oral Given 10/10/23 2226)  PARoxetine  (PAXIL ) tablet 10 mg (has no administration in time range)  thiamine  (VITAMIN B1) tablet 100 mg (100 mg Oral Given 10/10/23 2226)  ibuprofen  (ADVIL ) tablet 600 mg (has no administration in time range)  ondansetron  (ZOFRAN ) tablet 4 mg (has no administration in time range)  alum & mag hydroxide-simeth (MAALOX/MYLANTA) 200-200-20 MG/5ML suspension 30 mL (has no administration in time range)  sodium chloride  0.9 % bolus 500 mL (0 mLs Intravenous Stopped 10/10/23 1905)  acetaminophen  (TYLENOL ) tablet 650 mg (650 mg Oral Given 10/10/23 2226)     IMPRESSION / MDM / ASSESSMENT AND PLAN / ED COURSE                                Differential diagnosis includes, but is not limited to: Intracranial hemorrhage, facial fractures, cervical fracture, rib fractures, subacute CVA, UTI, electrolyte derangement  ED course: Patient appears frail and is covered in ecchymosis.  She does have several rib fractures however denies pain over the rib cage and is satting well on room air.  Urinalysis not consistent with UTI.  She had no acute anemia and no profound electrolyte derangements.  She had no intracranial hemorrhage or facial fractures.  Given that the patient states that her symptoms initiated 1 week ago I am concerned about the possibility of a subacute CVA.  I have ordered an MRI brain.  I did talk to the patient's friend who was initially at bedside and patient is going to possibly pursue rehab placement today, disposition is pending MRI brain  read   Clinical Course as of 10/10/23 2319  Mon Oct 10, 2023  1657 Went to evaluate the patient however patient in CT scan.  Will evaluate when she returns [HD]  1819 Urinalysis, Routine w reflex microscopic -Urine, Clean Catch(!) Urinalysis is unremarkable [HD]  1819 DG Hand 2 View Right No acute abnormality [HD]  1819 CT Head Wo Contrast No intracranial hemorrhage [HD]  1820 CT Cervical Spine Wo Contrast No cervical fracture [HD]  1820 CT Maxillofacial Wo Contrast No facial fractures [HD]  2241 MR BRAIN WO  CONTRAST MRI without any acute abnormality,  [HD]  2242 Will place the patient as a boarder status with PT and OT to see in the morning and transition of care.  Will sign patient out to oncoming physician [HD]    Clinical Course User Index [HD] Nicholaus Rolland BRAVO, MD     FINAL CLINICAL IMPRESSION(S) / ED DIAGNOSES   Final diagnoses:  Lightheadedness  Frequent falls  Closed fracture of multiple ribs, unspecified laterality, initial encounter  Gait instability     Rx / DC Orders   ED Discharge Orders     None        Note:  This document was prepared using Dragon voice recognition software and may include unintentional dictation errors.   Nicholaus Rolland BRAVO, MD 10/10/23 [phone]

## 2023-10-10 NOTE — ED Notes (Signed)
 Patient transported to MRI

## 2023-10-10 NOTE — ED Notes (Signed)
 Patient to CT.

## 2023-10-10 NOTE — ED Triage Notes (Addendum)
 Pt comes via EMS from home and was found by family in the floor today. Pt has had multiple falls per family like 5-6 a week. Pt has large bruise and hematoma to right side forehead. Pt states that is from previous fall. Pt denies any loc or hitting head. Pt not on thinners.   Pt was not seen for that fall from yesterday. Pt has not been using walker. Pt caretaker said pt refused ems yesterday.   Pt states she doesn't know how she fell but it was this morning. Pt has bruising on both hands and lower arms.

## 2023-10-10 NOTE — ED Notes (Signed)
 Pt ambulated to bathroom. Pt urinated and ambulated back to er stretcher.

## 2023-10-10 NOTE — ED Notes (Signed)
 Pt given sandwich tray and drink.

## 2023-10-10 NOTE — ED Notes (Signed)
 Made sure fall bundle was in place

## 2023-10-11 DIAGNOSIS — M069 Rheumatoid arthritis, unspecified: Secondary | ICD-10-CM | POA: Diagnosis not present

## 2023-10-11 DIAGNOSIS — E871 Hypo-osmolality and hyponatremia: Secondary | ICD-10-CM

## 2023-10-11 DIAGNOSIS — W19XXXA Unspecified fall, initial encounter: Secondary | ICD-10-CM | POA: Diagnosis not present

## 2023-10-11 DIAGNOSIS — N184 Chronic kidney disease, stage 4 (severe): Secondary | ICD-10-CM

## 2023-10-11 DIAGNOSIS — Z720 Tobacco use: Secondary | ICD-10-CM

## 2023-10-11 DIAGNOSIS — S2249XA Multiple fractures of ribs, unspecified side, initial encounter for closed fracture: Secondary | ICD-10-CM

## 2023-10-11 DIAGNOSIS — F1010 Alcohol abuse, uncomplicated: Secondary | ICD-10-CM

## 2023-10-11 DIAGNOSIS — E43 Unspecified severe protein-calorie malnutrition: Secondary | ICD-10-CM | POA: Diagnosis present

## 2023-10-11 DIAGNOSIS — S2239XA Fracture of one rib, unspecified side, initial encounter for closed fracture: Secondary | ICD-10-CM | POA: Diagnosis present

## 2023-10-11 DIAGNOSIS — Y92009 Unspecified place in unspecified non-institutional (private) residence as the place of occurrence of the external cause: Secondary | ICD-10-CM

## 2023-10-11 DIAGNOSIS — I10 Essential (primary) hypertension: Secondary | ICD-10-CM

## 2023-10-11 LAB — VITAMIN D 25 HYDROXY (VIT D DEFICIENCY, FRACTURES): Vit D, 25-Hydroxy: 124.25 ng/mL — ABNORMAL HIGH (ref 30–100)

## 2023-10-11 LAB — BASIC METABOLIC PANEL WITH GFR
Anion gap: 8 (ref 5–15)
BUN: 12 mg/dL (ref 8–23)
CO2: 26 mmol/L (ref 22–32)
Calcium: 8.6 mg/dL — ABNORMAL LOW (ref 8.9–10.3)
Chloride: 96 mmol/L — ABNORMAL LOW (ref 98–111)
Creatinine, Ser: 0.62 mg/dL (ref 0.44–1.00)
GFR, Estimated: >60 mL/min (ref >60)
Glucose, Bld: 75 mg/dL (ref 70–99)
Potassium: 3.6 mmol/L (ref 3.5–5.1)
Sodium: 130 mmol/L — ABNORMAL LOW (ref 135–145)

## 2023-10-11 LAB — CBC
HCT: 34.4 % — ABNORMAL LOW (ref 36.0–46.0)
Hemoglobin: 11.9 g/dL — ABNORMAL LOW (ref 12.0–15.0)
MCH: 32.4 pg (ref 26.0–34.0)
MCHC: 34.6 g/dL (ref 30.0–36.0)
MCV: 93.7 fL (ref 80.0–100.0)
Platelets: 339 K/uL (ref 150–400)
RBC: 3.67 MIL/uL — ABNORMAL LOW (ref 3.87–5.11)
RDW: 16.9 % — ABNORMAL HIGH (ref 11.5–15.5)
WBC: 8.1 K/uL (ref 4.0–10.5)
nRBC: 0 % (ref 0.0–0.2)

## 2023-10-11 LAB — CK: Total CK: 228 U/L (ref 38–234)

## 2023-10-11 LAB — PROTIME-INR
INR: 1 (ref 0.8–1.2)
Prothrombin Time: 13.7 s (ref 11.4–15.2)

## 2023-10-11 LAB — PHOSPHORUS: Phosphorus: 3.9 mg/dL (ref 2.5–4.6)

## 2023-10-11 LAB — OSMOLALITY: Osmolality: 271 mosm/kg — ABNORMAL LOW (ref 275–295)

## 2023-10-11 LAB — MAGNESIUM: Magnesium: 1.9 mg/dL (ref 1.7–2.4)

## 2023-10-11 MED ORDER — ACETAMINOPHEN 325 MG PO TABS
650.0000 mg | ORAL_TABLET | Freq: Four times a day (QID) | ORAL | Status: DC | PRN
  Administered 2023-10-11 – 2023-10-12 (×2): 650 mg via ORAL
  Filled 2023-10-11 (×2): qty 2

## 2023-10-11 MED ORDER — METHOTREXATE SODIUM 2.5 MG PO TABS
20.0000 mg | ORAL_TABLET | ORAL | Status: DC
  Administered 2023-10-13: 20 mg via ORAL
  Filled 2023-10-11: qty 8

## 2023-10-11 MED ORDER — MORPHINE SULFATE (PF) 2 MG/ML IV SOLN: 0.5000 mg | INTRAVENOUS | Status: DC | PRN

## 2023-10-11 MED ORDER — OXYCODONE-ACETAMINOPHEN 5-325 MG PO TABS
1.0000 | ORAL_TABLET | Freq: Four times a day (QID) | ORAL | Status: DC | PRN
  Administered 2023-10-12 – 2023-10-17 (×4): 1 via ORAL
  Filled 2023-10-11 (×5): qty 1

## 2023-10-11 MED ORDER — ENSURE PLUS HIGH PROTEIN PO LIQD
237.0000 mL | Freq: Three times a day (TID) | ORAL | Status: DC
  Administered 2023-10-11 – 2023-10-14 (×8): 237 mL via ORAL

## 2023-10-11 MED ORDER — ENSURE PLUS HIGH PROTEIN PO LIQD
237.0000 mL | Freq: Two times a day (BID) | ORAL | Status: DC
  Administered 2023-10-11: 237 mL via ORAL

## 2023-10-11 MED ORDER — HYDRALAZINE HCL 20 MG/ML IJ SOLN: 5.0000 mg | INTRAMUSCULAR | Status: DC | PRN

## 2023-10-11 MED ORDER — TURMERIC 500 MG PO CAPS: 500.0000 mg | ORAL_CAPSULE | Freq: Every day | ORAL | Status: DC

## 2023-10-11 MED ORDER — DM-GUAIFENESIN ER 30-600 MG PO TB12: 1.0000 | ORAL_TABLET | Freq: Two times a day (BID) | ORAL | Status: DC | PRN

## 2023-10-11 MED ORDER — SODIUM CHLORIDE 1 G PO TABS
1.0000 g | ORAL_TABLET | Freq: Two times a day (BID) | ORAL | Status: DC
  Administered 2023-10-11 – 2023-10-16 (×13): 1 g via ORAL
  Filled 2023-10-11 (×14): qty 1

## 2023-10-11 MED ORDER — NICOTINE POLACRILEX 2 MG MT GUM
2.0000 mg | CHEWING_GUM | OROMUCOSAL | Status: DC | PRN
  Administered 2023-10-11 – 2023-10-16 (×2): 2 mg via ORAL
  Filled 2023-10-11 (×4): qty 1

## 2023-10-11 MED ORDER — METHOCARBAMOL 500 MG PO TABS: 500.0000 mg | ORAL_TABLET | Freq: Three times a day (TID) | ORAL | Status: DC | PRN

## 2023-10-11 MED ORDER — LIDOCAINE 5 % EX PTCH
1.0000 | MEDICATED_PATCH | Freq: Every day | CUTANEOUS | Status: DC
  Administered 2023-10-11 – 2023-10-16 (×6): 1 via TRANSDERMAL
  Filled 2023-10-11 (×7): qty 1

## 2023-10-11 MED ORDER — NICOTINE 21 MG/24HR TD PT24
21.0000 mg | MEDICATED_PATCH | Freq: Every day | TRANSDERMAL | Status: DC
  Administered 2023-10-11 – 2023-10-17 (×7): 21 mg via TRANSDERMAL
  Filled 2023-10-11 (×7): qty 1

## 2023-10-11 MED ORDER — ONDANSETRON HCL 4 MG/2ML IJ SOLN: 4.0000 mg | Freq: Three times a day (TID) | INTRAMUSCULAR | Status: DC | PRN

## 2023-10-11 MED ORDER — SODIUM CHLORIDE 0.9 % IV SOLN: INTRAVENOUS | Status: AC

## 2023-10-11 MED ORDER — FOLIC ACID 1 MG PO TABS
1.0000 mg | ORAL_TABLET | Freq: Every day | ORAL | Status: DC
  Administered 2023-10-11 – 2023-10-17 (×7): 1 mg via ORAL
  Filled 2023-10-11 (×7): qty 1

## 2023-10-11 NOTE — Evaluation (Signed)
 Occupational Therapy Evaluation Patient Details Name: Lynn Norton MRN: [id number] DOB: 10-20-1950 Today's Date: 10/11/2023   History of Present Illness   73 y.o. female with medical history significant of  hypertension, GERD, rheumatoid arthritis, tobacco abuse, alcohol abuse in remission, PAD, CKD-4, UTI, E. coli bacteremia, falls, who presents with fall.     Clinical Impressions Pt was seen for OT evaluation this date. Prior to hospital admission, pt reports living alone (then later indicates she has a roommate but clarifies later in session that the roommate only comes a few hours a few times per week to assist with cleaning and transportation). Pt oriented x3 and noted some confusion. Pt presents with deficits in cognition, strength, L shoulder ROM, pain, and balance, affecting safe and optimal ADL completion. Pt currently requires MIN A for bed mobility and ADL transfers with RW, able to only walk ~5' with RW requiring assist for RW mgt and pt endorsing significant fatigue and sat in recliner. Pt would benefit from skilled OT services to address noted impairments and functional limitations (see below for any additional details) in order to maximize safety and independence while minimizing future risk of falls, injury, and readmission. Anticipate the need for follow up OT services upon acute hospital DC. Pt unsafe to discharge home alone.     If plan is discharge home, recommend the following:   A little help with walking and/or transfers;A lot of help with bathing/dressing/bathroom;Direct supervision/assist for medications management;Supervision due to cognitive status;Direct supervision/assist for financial management;Assistance with cooking/housework;Assist for transportation;Help with stairs or ramp for entrance     Functional Status Assessment   Patient has had a recent decline in their functional status and demonstrates the ability to make significant improvements in function in a  reasonable and predictable amount of time.     Equipment Recommendations   Other (comment) (defe)     Recommendations for Other Services         Precautions/Restrictions   Precautions Precautions: Fall Recall of Precautions/Restrictions: Impaired Restrictions Weight Bearing Restrictions Per Provider Order: No     Mobility Bed Mobility Overal bed mobility: Needs Assistance Bed Mobility: Supine to Sit     Supine to sit: Min assist, HOB elevated, Used rails          Transfers Overall transfer level: Needs assistance Equipment used: Rolling walker (2 wheels) Transfers: Sit to/from Stand Sit to Stand: Min assist           General transfer comment: VC for hand placement      Balance Overall balance assessment: Needs assistance Sitting-balance support: Feet supported, Single extremity supported Sitting balance-Leahy Scale: Fair     Standing balance support: Bilateral upper extremity supported, Reliant on assistive device for balance Standing balance-Leahy Scale: Poor                             ADL either performed or assessed with clinical judgement   ADL Overall ADL's : Needs assistance/impaired                                       General ADL Comments: Pt requires MIN A for LB ADL tasks, CGA for ADL mobility, and supv for ADL for safety.     Vision         Perception         Praxis  Pertinent Vitals/Pain Pain Assessment Pain Assessment: Faces Faces Pain Scale: Hurts a little bit Pain Location: contusions on face Pain Descriptors / Indicators: Grimacing, Guarding Pain Intervention(s): Limited activity within patient's tolerance, Monitored during session, Repositioned, Patient requesting pain meds-RN notified     Extremity/Trunk Assessment Upper Extremity Assessment Upper Extremity Assessment: Generalized weakness;Right hand dominant;LUE deficits/detail LUE Deficits / Details: pt reports shoulder  issues, tends to keep LUE tucked at her side, difficulty with shoulder flexion but able to reach eventually to hold onto RW LUE: Unable to fully assess due to pain;Shoulder pain with ROM   Lower Extremity Assessment Lower Extremity Assessment: Generalized weakness       Communication Communication Communication: No apparent difficulties   Cognition Arousal: Alert Behavior During Therapy: WFL for tasks assessed/performed Cognition: No family/caregiver present to determine baseline, Cognition impaired             OT - Cognition Comments: Pt alert, oriented x3, and appears confused requiring cues for safety                 Following commands: Impaired Following commands impaired: Follows one step commands inconsistently     Cueing  General Comments   Cueing Techniques: Verbal cues;Gestural cues;Tactile cues;Visual cues      Exercises     Shoulder Instructions      Home Living Family/patient expects to be discharged to:: Private residence Living Arrangements: Alone (Pt initially states she lives alone with her cat, then reports having a roommate, then clarifies later that the person is not her roommate but does help with cleaning.)   Type of Home: House Home Access: Stairs to enter [employer] of Steps: 3 Entrance Stairs-Rails: Right Home Layout: One level     Bathroom Shower/Tub: Walk-in shower         Home Equipment: Cane - single Librarian, academic (2 wheels);Cane - quad;Shower seat - built in;Grab bars - tub/shower   Additional Comments: No family/caregivers present to verify information provided.      Prior Functioning/Environment Prior Level of Function : Patient poor historian/Family not available;History of Falls (last six months)             Mobility Comments: Pt denies SPC or RW use most of the time, endorses furniture walking, reports 3 falls in past 79mo (although chart indicates pt has fallen several times per week per  family) ADLs Comments: Pt reports mod indep with ADL and driving. Roommate/not roommate comes for a few hours several times/wk to assist with cleaning and pt reports initially that she drives but then shares that the person also takes her to dr appts and stores. Pt reports indep with medication mgt.    OT Problem List: Decreased strength;Pain;Decreased range of motion;Decreased safety awareness;Decreased activity tolerance;Impaired balance (sitting and/or standing);Decreased knowledge of use of DME or AE;Impaired UE functional use;Decreased cognition   OT Treatment/Interventions: Self-care/ADL training;Therapeutic exercise;Therapeutic activities;Cognitive remediation/compensation;Energy conservation;DME and/or AE instruction;Patient/family education;Balance training      OT Goals(Current goals can be found in the care plan section)   Acute Rehab OT Goals Patient Stated Goal: pt wants to go home OT Goal Formulation: With patient Time For Goal Achievement: 10/25/23 Potential to Achieve Goals: Fair ADL Goals Pt Will Perform Lower Body Dressing: with modified independence;sit to/from stand;sitting/lateral leans Pt Will Transfer to Toilet: with modified independence;ambulating (LRAD) Pt Will Perform Toileting - Clothing Manipulation and hygiene: with modified independence;sitting/lateral leans;sit to/from stand Additional ADL Goal #1: Pt will complete all aspects of grooming at sink wiht  supv for safety, 2/2 opportunities, standing >84min each time.   OT Frequency:  Min 2X/week    Co-evaluation              AM-PAC OT 6 Clicks Daily Activity     Outcome Measure Help from another person eating meals?: None Help from another person taking care of personal grooming?: A Little Help from another person toileting, which includes using toliet, bedpan, or urinal?: A Lot Help from another person bathing (including washing, rinsing, drying)?: A Lot Help from another person to put on and  taking off regular upper body clothing?: A Lot Help from another person to put on and taking off regular lower body clothing?: A Lot 6 Click Score: 15   End of Session Equipment Utilized During Treatment: Rolling walker (2 wheels);Gait belt  Activity Tolerance: Patient limited by fatigue Patient left: in chair;with call bell/phone within reach;with chair alarm set  OT Visit Diagnosis: Other abnormalities of gait and mobility (R26.89);Repeated falls (R29.6);Muscle weakness (generalized) (M62.81);Pain Pain - Right/Left: Left Pain - part of body: Shoulder (and face)                Time: 9154-9093 OT Time Calculation (min): 21 min Charges:  OT General Charges $OT Visit: 1 Visit OT Evaluation $OT Eval Moderate Complexity: 1 Mod  Warren SAUNDERS., MPH, MS, OTR/L ascom [phone] 10/11/23, 10:39 AM

## 2023-10-11 NOTE — Plan of Care (Signed)

## 2023-10-11 NOTE — H&P (Signed)
 History and Physical    Lynn Norton FMW:[id number] DOB: 1950/05/04 DOA: 10/10/2023  Referring MD/NP/PA:   PCP: [facility]   Patient coming from:  The patient is coming from home.     Chief Complaint: fall  HPI: Lynn Norton is a 73 y.o. female with medical history significant of  hypertension, GERD, rheumatoid arthritis, tobacco abuse, alcohol abuse in remission, PAD, CKD-4, UTI, E. coli bacteremia, who presents with fall.  Per ED physician, family reported that pt has had multiple falls in the past 5-6 a week. Pt has not been using her walker. Pt refused EMS yesterday. She was found on the floor today. Pt states she doesn't know how she fell but it was this morning. She denies LOC or head injury. She has a large hematoma to right side forehead and bruise around right eye. She states that these are from previous fall. Pt has also bruising on both hands and forearms. Pt states that she has dizziness and gait instability.  No unilateral numbness or tingling in extremities.  No facial droop or slurred speech.  No hearing loss or vision change.   Chest x-ray showed rib fracture in ED, but she denies any chest wall or rib cage pain.  No SOB, cough.  Denies nausea, vomiting, diarrhea or abdominal pain.  Denies symptoms of UTI.  No fever or chills.  She states that she stopped drinking alcohol several years ago.  Data reviewed independently and ED Course: pt was found to have WBC 10.9, GFR> 60, negative UA, sodium 130, temperature normal, blood pressure 152/83, heart rate 89, RR 16, oxygen saturation 95% on room air.  CT of head is negative for acute intracranial abnormalities.  CT of C-spine negative for acute injury.  CT of maxillofacial image negative for acute injury.  X-ray of right hand negative for acute bony fracture.  MRI of the brain negative for acute stroke.  Patient is placed in MedSurg bed for observation.  Chest x-ray: No active cardiopulmonary disease. Acute mildly  displaced fractures of the lateral right tenth rib and anterior left sixth rib and lateral left tenth rib.  MRI-brain 1. No acute intracranial abnormality. 2. Extensive chronic microvascular ischemic changes. 3. Generalized parenchymal volume loss. 4. Right frontal scalp hematoma with surrounding soft tissue swelling.  X-ray of right hand: 1. Diffuse osteopenia. No acute, displaced fracture or dislocation. If concern persists for underlying fracture, a follow-up CT of the hand may be of benefit for further characterization. 2. Fairly severe palmar subluxation of the second through fourth MCP joints, worrisome for rheumatoid arthritis.   EKG: I have personally reviewed.  Sinus rhythm, QTc 468, bifascicular block, early R wave progression.   Review of Systems:   General: no fevers, chills, no body weight gain, has fatigue. HEENT: no blurry vision, hearing changes or sore throat.  Has a hematoma to right forearm Respiratory: no dyspnea, coughing, wheezing CV: no chest pain, no palpitations GI: no nausea, vomiting, abdominal pain, diarrhea, constipation GU: no dysuria, burning on urination, increased urinary frequency, hematuria  Ext: no leg edema Neuro: no unilateral weakness, numbness, or tingling, no vision change or hearing loss.  Has fall, dizziness, gait instability. Skin: Has bruises to both hands and forearms, has bruise around the right eye. MSK: Has finger deformity, has pain to the right hand Heme: No easy bruising.  Travel history: No recent long distant travel.   Allergy:  Allergies  Allergen Reactions   Sulfa Antibiotics Other (See Comments) and Itching   Ceftriaxone  Rash   Penicillins Rash and Other (See Comments)   Rocephin [Ceftriaxone] Hives    Spoke with patient. States she had hives that spread in response to rocephin   Sulfa Antibiotics    Penicillins Itching    Past Medical History:  Diagnosis Date   Arthritis    CKD (chronic kidney disease), stage  IV (HCC)    HTN (hypertension)    RA (rheumatoid arthritis) (HCC)    Tobacco abuse     Past Surgical History:  Procedure Laterality Date   ORIF ELBOW FRACTURE Right 04/27/2021   Procedure: OPEN REDUCTION INTERNAL FIXATION (ORIF) ELBOW/OLECRANON FRACTURE;  Surgeon: Lynn Sharper, MD;  Location: [facility] ORS;  Service: Orthopedics;  Laterality: Right;   ORIF WRIST FRACTURE Right 10/27/2022   Procedure: OPEN REDUCTION INTERNAL FIXATION (ORIF) WRIST FRACTURE;  Surgeon: Lynn Norleen PARAS, MD;  Location: [facility] ORS;  Service: Orthopedics;  Laterality: Right;    Social History:  reports that she has been smoking cigarettes. She has a 50 pack-year smoking history. She has never used smokeless tobacco. She reports that she does not currently use alcohol. She reports that she does not use drugs.  Family History:  Family History  Problem Relation Age of Onset   Heart attack Brother      Prior to Admission medications   Medication Sig Start Date End Date Taking? Authorizing Provider  albuterol  (VENTOLIN  HFA) 108 (90 Base) MCG/ACT inhaler Inhale 1-2 puffs into the lungs 4 (four) times daily as needed for wheezing or shortness of breath.    [provider]  amLODipine  (NORVASC ) 10 MG tablet Take 1 tablet (10 mg total) by mouth daily. 10/30/22   Lynn Norton, Sona, MD  docusate sodium  (COLACE) 100 MG capsule Take 1 capsule (100 mg total) by mouth 2 (two) times daily. 10/29/22   Lynn Norton, Sona, MD  folic acid  (FOLVITE ) 1 MG tablet Take 1 mg by mouth daily.    [provider]  HYDROcodone -acetaminophen  (NORCO/VICODIN) 5-325 MG tablet Take 1 tablet by mouth every 6 (six) hours as needed for severe pain. 10/28/22   Lynn Lynwood Double, PA-C  methotrexate  (RHEUMATREX) 2.5 MG tablet Take 20 mg by mouth every Thursday.    [provider]  mirtazapine  (REMERON ) 7.5 MG tablet Take 7.5 mg by mouth at bedtime.    [provider]  Multiple Vitamin (MULTIVITAMIN WITH MINERALS) TABS tablet Take 1 tablet by  mouth daily. 10/30/22   Lynn Norton, Sona, MD  nicotine  (NICODERM CQ  - DOSED IN MG/24 HOURS) 21 mg/24hr patch Place 1 patch (21 mg total) onto the skin daily. 10/30/22   Lynn Norton, Sona, MD  PARoxetine  (PAXIL ) 10 MG tablet Take 10 mg by mouth daily.    [provider]  predniSONE  (DELTASONE ) 5 MG tablet Take 5 mg by mouth daily.    [provider]  thiamine  (VITAMIN B-1) 100 MG tablet Take 1 tablet (100 mg total) by mouth daily. 10/30/22   Tobie Calix, MD    Physical Exam: Vitals:   10/10/23 2218 10/11/23 0107 10/11/23 0109 10/11/23 0110  BP: (!) 152/83 (!) 141/72 132/75 134/69  Pulse: 85 88 (!) 101 (!) 107  Resp: 16 16  17   Temp: 98 F (36.7 C) 98.2 F (36.8 C)    TempSrc: Oral Oral    SpO2: 98% 94% 94% 93%   General: Not in acute distress HEENT: has bruise around the right eye.  Has a hematoma to right forehead       Eyes: PERRL, EOMI, no jaundice  ENT: No discharge from the ears and nose, no pharynx injection, no tonsillar enlargement.        Neck: No JVD, no bruit, no mass felt. Heme: No neck lymph node enlargement. Cardiac: S1/S2, RRR, No murmurs, No gallops or rubs. Respiratory: No rales, wheezing, rhonchi or rubs. GI: Soft, nondistended, nontender, no rebound pain, no organomegaly, BS present. GU: No hematuria Ext: No pitting leg edema bilaterally. 1+DP/PT pulse bilaterally. Skin: Has bruises to both hands and forearms MSK: Has finger deformity, has tenderness to right hand, particularly right index finger   Neuro: Alert, oriented X3, cranial nerves II-XII grossly intact, moves all extremities normally. Psych: Patient is not psychotic, no suicidal or hemocidal ideation.  Labs on Admission: I have personally reviewed following labs and imaging studies  CBC: Recent Labs  Lab 10/10/23 1308  WBC 10.9*  HGB 12.8  HCT 38.0  MCV 95.5  PLT 360   Basic Metabolic Panel: Recent Labs  Lab 10/10/23 1308  NA 130*  K 4.3  CL 92*  CO2 25  GLUCOSE 89  BUN 13   CREATININE 0.57  CALCIUM 9.0  MG 1.9  PHOS 3.9   GFR: CrCl cannot be calculated (Unknown ideal weight.). Liver Function Tests: No results for input(s): AST, ALT, ALKPHOS, BILITOT, PROT, ALBUMIN in the last 168 hours. No results for input(s): LIPASE, AMYLASE in the last 168 hours. No results for input(s): AMMONIA in the last 168 hours. Coagulation Profile: No results for input(s): INR, PROTIME in the last 168 hours. Cardiac Enzymes: Recent Labs  Lab 10/10/23 1308  CKTOTAL 228   BNP (last 3 results) No results for input(s): PROBNP in the last 8760 hours. HbA1C: No results for input(s): HGBA1C in the last 72 hours. CBG: No results for input(s): GLUCAP in the last 168 hours. Lipid Profile: No results for input(s): CHOL, HDL, LDLCALC, TRIG, CHOLHDL, LDLDIRECT in the last 72 hours. Thyroid Function Tests: No results for input(s): TSH, T4TOTAL, FREET4, T3FREE, THYROIDAB in the last 72 hours. Anemia Panel: No results for input(s): VITAMINB12, FOLATE, FERRITIN, TIBC, IRON, RETICCTPCT in the last 72 hours. Urine analysis:    Component Value Date/Time   COLORURINE YELLOW (A) 10/10/2023 1716   APPEARANCEUR CLEAR (A) 10/10/2023 1716   LABSPEC 1.006 10/10/2023 1716   PHURINE 7.0 10/10/2023 1716   GLUCOSEU NEGATIVE 10/10/2023 1716   HGBUR NEGATIVE 10/10/2023 1716   BILIRUBINUR NEGATIVE 10/10/2023 1716   KETONESUR NEGATIVE 10/10/2023 1716   PROTEINUR NEGATIVE 10/10/2023 1716   NITRITE NEGATIVE 10/10/2023 1716   LEUKOCYTESUR NEGATIVE 10/10/2023 1716   Sepsis Labs: @LABRCNTIP (procalcitonin:4,lacticidven:4) )No results found for this or any previous visit (from the past 240 hours).   Radiological Exams on Admission:   Assessment/Plan Principal Problem:   Closed rib fracture Active Problems:   Fall at home, initial encounter   Rheumatoid arthritis (HCC)   Hyponatremia   Tobacco use   HTN (hypertension)   CKD  (chronic kidney disease) stage 4, GFR 15-29 ml/min (HCC)   Protein-calorie malnutrition, severe (HCC)   Assessment and Plan:  Closed rib fracture: Patient denies rib cage pain or chest wall pain, but x-ray showed acute mildly displaced fractures of the lateral right tenth rib and anterior left sixth rib and lateral left tenth rib.  -place in med-surg bed for obs - If patient develops pain --> pain control: As needed Percocet, Tylenol , Lidoderm  - Incentive spirometry - As needed albuterol   Fall at home, initial encounter -fall precaution - PT/OT - TOC consult for home health need  Rheumatoid arthritis [facility]): Patient states she is not taking prednisone  currently. -pt is on methotrexate  weekly - Continue folic acid   Hyponatremia: Sodium 130, this is chronic issue. -Sodium chloride  tablet 1 g twice daily -: Normal saline 500 cc bolus, then 75 cc/h for 8 hours  Tobacco use -Nicotine  patch  HTN (hypertension) -IV hydralazine  as needed - Amlodipine   CKD (chronic kidney disease) stage 4, GFR 15-29 ml/min (HCC): Renal function has significantly improved.  GFR > 60 today -Follow-up with BMP  Protein-calorie malnutrition, severe (HCC): Body weight 39 kg, BMI - Ensure - Nutrition consult     DVT ppx: SCD  Code Status: DNR (I discussed with patient and explained the meaning of CODE STATUS. Patient wants to be DNR)  Family Communication:     not done, no family member is at bed side.     Disposition Plan:  Anticipate discharge back to previous environment  Consults called:  none  Admission status and Level of care: Med-Surg:    for obs    Dispo: The patient is from: Home              Anticipated d/c is to: Home              Anticipated d/c date is: 1 day              Patient currently is not medically stable to d/c.    Severity of Illness:  The appropriate patient status for this patient is OBSERVATION. Observation status is judged to be reasonable and necessary in  order to provide the required intensity of service to ensure the patient's safety. The patient's presenting symptoms, physical exam findings, and initial radiographic and laboratory data in the context of their medical condition is felt to place them at decreased risk for further clinical deterioration. Furthermore, it is anticipated that the patient will be medically stable for discharge from the hospital within 2 midnights of admission.        Date of Service 10/11/2023    Caleb Exon [facility]   If 7PM-7AM, please contact night-coverage [email or web address] 10/11/2023, 1:55 AM

## 2023-10-11 NOTE — Plan of Care (Signed)
 Patient was admitted after midnight, H&P reviewed. Patient was admitted due to frequent falls, closed rib fracture and right frontal hematoma.  Pain in the head 3/10, it is not hurting much as per patient. Patient has significant ambulatory dysfunction, seen by PT and OT, recommend SNF placement Orthostatics negative Chronic hyponatremia, hypotonic, serum osmolality 271.  Continue fluid restriction 1.4 L/day.  Monitor sodium level daily Continue current treatment. Follow TOC for SNF placement.  Medically stable. This is same-day admit, no charge note.

## 2023-10-11 NOTE — Progress Notes (Addendum)
 Initial Nutrition Assessment  DOCUMENTATION CODES:   Severe malnutrition in context of social or environmental circumstances, Underweight  INTERVENTION:   -Liberalize diet to regular for widest variety of meal selections -MVI with minerals daily -Ensure Plus High Protein po TID, each supplement provides 350 kcal and 20 grams of protein   NUTRITION DIAGNOSIS:   Severe Malnutrition related to social / environmental circumstances as evidenced by moderate fat depletion, severe fat depletion, moderate muscle depletion, severe muscle depletion.  GOAL:   Patient will meet greater than or equal to 90% of their needs  MONITOR:   PO intake, Supplement acceptance  REASON FOR ASSESSMENT:   Consult Assessment of nutrition requirement/status  ASSESSMENT:   Pt with medical history significant of  hypertension, GERD, rheumatoid arthritis, tobacco abuse, alcohol abuse in remission, PAD, CKD-4, UTI, E. coli bacteremia, who presents with fall.  Pt admitted with closed rib fracture  Reviewed I/O's: +780 ml x 24 hours  Per H&P, pt has experienced multiple falls in the past 5-6 weeks and has not been using her walker for ambulation.   Per MD notes, pt with large hematoma to rt forehead. X-ray showed acute mildly displaced fractures of the lateral right tenth rib and anterior left sixth rib and lateral left tenth rib.   Spoke with pt, who was sitting up in recliner chair at time of visit. Pt reports feeling better after being able to sit in the chair. Pt reports fair appetite, consumed a p[piece of toast this morning. PTA, pt states she cooked her own meals and did her own grocery shopping. Typical meal intake is 3 meals per day (Breakfast: toast, Lunch: leftover, Dinner: meat, starch, and vegetable). Pt shares that she eats a lot of ground beef at home. She also reports drinking 1-2 Ensure supplements daily.  Pt denies any weight loss PTA, but states I think I may have lost weight here.  Reviewed wt hx; last recorded wt was 85# on 06/13/23. Wt hx reveals wt stability, however, no wt this admission. RD will obtain new wt to better assess weight changes.   Discussed importance of good meal and supplement intake to promote healing. Pt amenable to Ensure supplements.   Medications reviewed and include colace, folic acid , and thiamine .   Labs reviewed: Na: 130, CBGS: 95 (inpatient orders for glycemic control are none).    NUTRITION - FOCUSED PHYSICAL EXAM:  Flowsheet Row Most Recent Value  Orbital Region Moderate depletion  Upper Arm Region Severe depletion  Thoracic and Lumbar Region Severe depletion  Buccal Region Moderate depletion  Temple Region Moderate depletion  Clavicle Bone Region Severe depletion  Clavicle and Acromion Bone Region Severe depletion  Scapular Bone Region Severe depletion  Dorsal Hand Moderate depletion  Patellar Region Moderate depletion  Anterior Thigh Region Moderate depletion  Posterior Calf Region Moderate depletion  Edema (RD Assessment) None  Hair Reviewed  Eyes Reviewed  Mouth Reviewed  Skin Reviewed  Nails Reviewed    Diet Order:   Diet Order             Diet regular Fluid consistency: Thin; Fluid restriction: 1500 mL Fluid  Diet effective now                   EDUCATION NEEDS:   Education needs have been addressed  Skin:  Skin Assessment: Reviewed RN Assessment  Last BM:  10/10/23  Height:   Ht Readings from Last 1 Encounters:  10/22/22 5' 2 (1.575 m)    Weight:  Wt Readings from Last 1 Encounters:  10/11/23 39.4 kg    Ideal Body Weight:  50 kg  BMI:  Body mass index is 15.89 kg/m.  Estimated Nutritional Needs:   Kcal:  1550-1750  Protein:  75-90 grams  Fluid:  1.5-1.7 L    Margery ORN, RD, LDN, CDCES Registered Dietitian III Certified Diabetes Care and Education Specialist If unable to reach this RD, please use RD Inpatient group chat on secure chat between hours of 8am-4 pm daily

## 2023-10-12 DIAGNOSIS — R2681 Unsteadiness on feet: Secondary | ICD-10-CM

## 2023-10-12 DIAGNOSIS — R42 Dizziness and giddiness: Secondary | ICD-10-CM | POA: Diagnosis not present

## 2023-10-12 DIAGNOSIS — S2249XA Multiple fractures of ribs, unspecified side, initial encounter for closed fracture: Secondary | ICD-10-CM | POA: Diagnosis not present

## 2023-10-12 DIAGNOSIS — R296 Repeated falls: Secondary | ICD-10-CM | POA: Diagnosis not present

## 2023-10-12 LAB — BASIC METABOLIC PANEL WITH GFR
Anion gap: 13 (ref 5–15)
BUN: 38 mg/dL — ABNORMAL HIGH (ref 8–23)
CO2: 23 mmol/L (ref 22–32)
Calcium: 8.6 mg/dL — ABNORMAL LOW (ref 8.9–10.3)
Chloride: 98 mmol/L (ref 98–111)
Creatinine, Ser: 0.71 mg/dL (ref 0.44–1.00)
GFR, Estimated: >60 mL/min (ref >60)
Glucose, Bld: 99 mg/dL (ref 70–99)
Potassium: 3.9 mmol/L (ref 3.5–5.1)
Sodium: 134 mmol/L — ABNORMAL LOW (ref 135–145)

## 2023-10-12 LAB — CBC
HCT: 34 % — ABNORMAL LOW (ref 36.0–46.0)
Hemoglobin: 11.4 g/dL — ABNORMAL LOW (ref 12.0–15.0)
MCH: 32 pg (ref 26.0–34.0)
MCHC: 33.5 g/dL (ref 30.0–36.0)
MCV: 95.5 fL (ref 80.0–100.0)
Platelets: 366 K/uL (ref 150–400)
RBC: 3.56 MIL/uL — ABNORMAL LOW (ref 3.87–5.11)
RDW: 16.7 % — ABNORMAL HIGH (ref 11.5–15.5)
WBC: 9.5 K/uL (ref 4.0–10.5)
nRBC: 0 % (ref 0.0–0.2)

## 2023-10-12 LAB — MAGNESIUM: Magnesium: 1.9 mg/dL (ref 1.7–2.4)

## 2023-10-12 LAB — PHOSPHORUS: Phosphorus: 4.2 mg/dL (ref 2.5–4.6)

## 2023-10-12 NOTE — Evaluation (Signed)
 Physical Therapy Evaluation Patient Details Name: Lynn Norton MRN: [id number] DOB: 23-Nov-1950 Today's Date: 10/12/2023  History of Present Illness  73 y.o. female with medical history significant of  hypertension, GERD, rheumatoid arthritis, tobacco abuse, alcohol abuse in remission, PAD, CKD-4, UTI, E. coli bacteremia, falls, who presents with fall.  Clinical Impression  Pt received in sidelying and is agreeable for PT eval. Questionable historian of health due to AMS at this time, although she is A&O. At baseline, pt is mod I for ADL's, household ambulation without AD, community ambulation with AD, and driving. Has PRN care from roommate for IADL's.   Pt presents with generalized weakness, LUE immobility, decreased gross balance, decreased activity tolerance, impaired processing, poor safety awareness, and gait deficits, resulting in impaired functional mobility from baseline. Due to deficits, pt required mod assist for bed mobility, CGA-min assist for transfers, and min assist for ambulation in room with RW. Due to impaired cognition, she requires max multimodal cues for safety, sequencing, and attention to task. Episode of LOB during gait demonstrating limited balance reactive strategies, which increases her risk of falls and injury.  Deficits limit the pt's ability to safely and independently perform ADL's, transfer, and ambulate. Pt will benefit from acute skilled PT services to address deficits for return to baseline function. Pt will benefit from post acute therapy services to address deficits for return to baseline function.         If plan is discharge home, recommend the following: A little help with walking and/or transfers;A little help with bathing/dressing/bathroom;Assistance with cooking/housework;Direct supervision/assist for medications management;Assist for transportation;Help with stairs or ramp for entrance   Can travel by private vehicle   Yes    Equipment  Recommendations  (defer to post acute)     Functional Status Assessment Patient has had a recent decline in their functional status and demonstrates the ability to make significant improvements in function in a reasonable and predictable amount of time.     Precautions / Restrictions Precautions Precautions: Fall Recall of Precautions/Restrictions: Impaired Restrictions Weight Bearing Restrictions Per Provider Order: No      Mobility  Bed Mobility         Supine to sit: Mod assist     General bed mobility comments: mod assist for trunk and hip facilitation to sit EOB, HOB semi elevated, non-use of LUE (kept at side)    Transfers   Equipment used: Rolling walker (2 wheels)   Sit to Stand: Min assist           General transfer comment: min assist for power to stand from EOB with RW, max multimodal cues for safety, sequencing, and hand placement; increased time/effort to achieve full upright standing. CGA for safety to stand from toilet with RW, use of RUE for support on railing. poor eccentric lowering to toilet and recliner with RW, max multimodal cues for safety, sequencing, and hand placement.    Ambulation/Gait Ambulation/Gait assistance: Min assist Gait Distance (Feet): 30 Feet (60ft x2) Assistive device: Rolling walker (2 wheels)         General Gait Details: Min assist for balance, cues, and RW management to ambulate to/from bathroom. Demonstrates narrow BOS, decreased step length/foot clearance bilaterally, impaired RW proximity and negotiation.     Balance Overall balance assessment: Needs assistance Sitting-balance support: Feet supported, Single extremity supported Sitting balance-Leahy Scale: Fair     Standing balance support: Bilateral upper extremity supported, Reliant on assistive device for balance Standing balance-Leahy Scale: Poor Standing balance comment:  min assist for gait; episode of imbalance with posterior bias with turns, requiring mod  assist for correction. demonstrates limited balance reactive strategies                             Pertinent Vitals/Pain Pain Assessment Pain Assessment: Faces Faces Pain Scale: Hurts a little bit Pain Location: LUE with movement Pain Descriptors / Indicators: Grimacing, Guarding Pain Intervention(s): Monitored during session, Repositioned    Home Living Family/patient expects to be discharged to:: Private residence Living Arrangements: Alone (Pt initially states she lives alone with her cat, then reports having a roommate, then clarifies later that the person is not her roommate but does help with cleaning.)   Type of Home: House Home Access: Stairs to enter Entrance Stairs-Rails: Right Entrance Stairs-Number of Steps: 3   Home Layout: One level Home Equipment: Cane - single Librarian, academic (2 wheels);Cane - quad;Shower seat - built in;Grab bars - tub/shower Additional Comments: No family/caregivers present to verify information provided.    Prior Function Prior Level of Function : Patient poor historian/Family not available;History of Falls (last six months)             Mobility Comments: Pt denies SPC or RW use most of the time, endorses furniture walking, reports 3 falls in past 84mo (although chart indicates pt has fallen several times per week per family) ADLs Comments: Pt reports mod indep with ADL and driving. Roommate/not roommate comes for a few hours several times/wk to assist with cleaning and pt reports initially that she drives but then shares that the person also takes her to dr appts and stores. Pt reports indep with medication mgt.     Extremity/Trunk Assessment   Upper Extremity Assessment Upper Extremity Assessment: Defer to OT evaluation LUE Deficits / Details: pt reports shoulder issues, tends to keep LUE tucked at her side, difficulty with shoulder flexion but able to reach eventually to hold onto RW    Lower Extremity Assessment Lower  Extremity Assessment: Generalized weakness       Communication   Communication Communication: No apparent difficulties    Cognition Arousal: Alert Behavior During Therapy: WFL for tasks assessed/performed   PT - Cognitive impairments: Safety/Judgement, Problem solving, Sequencing                       PT - Cognition Comments: multimodal cues for safety and sequencing of movement Following commands: Impaired Following commands impaired: Follows one step commands inconsistently, Follows one step commands with increased time     Cueing Cueing Techniques: Verbal cues, Gestural cues, Tactile cues, Visual cues     General Comments General comments (skin integrity, edema, etc.): contusion/bruising to R eye/forehead; scattered bruising and scabs to BUE/BLE from falls and running into things    Exercises Other Exercises Other Exercises: Participates in bed mobility, transfers, and ambulation to bathroom for toileting and hand hygiene. Educated re: PT role/POC, DC recommendations, fall risk, call for help, safety/sequencing with transfers/gait in RW.   Assessment/Plan    PT Assessment Patient needs continued PT services  PT Problem List Decreased strength;Decreased range of motion;Decreased activity tolerance;Decreased balance;Decreased mobility;Decreased cognition;Decreased safety awareness;Pain;Decreased skin integrity       PT Treatment Interventions DME instruction;Gait training;Stair training;Functional mobility training;Therapeutic activities;Therapeutic exercise;Balance training;Neuromuscular re-education    PT Goals (Current goals can be found in the Care Plan section)  Acute Rehab PT Goals Patient Stated Goal: none stated  Frequency Min 2X/week        AM-PAC PT 6 Clicks Mobility  Outcome Measure Help needed turning from your back to your side while in a flat bed without using bedrails?: A Little Help needed moving from lying on your back to sitting on  the side of a flat bed without using bedrails?: A Lot Help needed moving to and from a bed to a chair (including a wheelchair)?: A Little Help needed standing up from a chair using your arms (e.g., wheelchair or bedside chair)?: A Little Help needed to walk in hospital room?: A Little Help needed climbing 3-5 steps with a railing? : A Lot 6 Click Score: 16    End of Session Equipment Utilized During Treatment: Gait belt Activity Tolerance: Patient tolerated treatment well Patient left: in chair;with call bell/phone within reach;with chair alarm set Nurse Communication: Mobility status PT Visit Diagnosis: Unsteadiness on feet (R26.81);Repeated falls (R29.6);Muscle weakness (generalized) (M62.81);Difficulty in walking, not elsewhere classified (R26.2);Pain Pain - Right/Left: Left Pain - part of body: Arm    Time: 9163-9147 PT Time Calculation (min) (ACUTE ONLY): 16 min   Charges:   PT Evaluation $PT Eval Moderate Complexity: 1 Mod PT Treatments $Therapeutic Activity: 8-22 mins PT General Charges $$ ACUTE PT VISIT: 1 Visit        Camie CHARLENA Kluver, PT, DPT 9:05 AM,10/12/23 Physical Therapist - [facility]

## 2023-10-12 NOTE — Assessment & Plan Note (Signed)
 Seems chronic.  Improved to 134 s/p normal saline -Continue with salt tablets

## 2023-10-12 NOTE — Progress Notes (Signed)
 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   [facility] will continue to follow for discharge disposition.   Please call with any Integrated Health Services related questions or concerns.      Thank you,  Daphne Shed, LPN [facility] Liaison [phone]

## 2023-10-12 NOTE — Assessment & Plan Note (Signed)
 CKD stage IV was listed in her chart but renal function has been improved with 3 GFR above 60 today. - Continue to monitor -Avoid nephrotoxin

## 2023-10-12 NOTE — Plan of Care (Signed)
  Problem: Clinical Measurements: Goal: Ability to maintain clinical measurements within normal limits will improve Outcome: Progressing Goal: Will remain free from infection Outcome: Progressing Goal: Diagnostic test results will improve Outcome: Progressing Goal: Respiratory complications will improve Outcome: Progressing Goal: Cardiovascular complication will be avoided Outcome: Progressing   Problem: Activity: Goal: Risk for activity intolerance will decrease Outcome: Progressing   Problem: Nutrition: Goal: Adequate nutrition will be maintained Outcome: Progressing   Problem: Coping: Goal: Level of anxiety will decrease Outcome: Progressing   Problem: Elimination: Goal: Will not experience complications related to bowel motility Outcome: Progressing Goal: Will not experience complications related to urinary retention Outcome: Progressing   Problem: Pain Managment: Goal: General experience of comfort will improve and/or be controlled Outcome: Progressing   Problem: Skin Integrity: Goal: Risk for impaired skin integrity will decrease Outcome: Progressing

## 2023-10-12 NOTE — Assessment & Plan Note (Signed)
 Patient states she is not taking prednisone  currently. -pt is on methotrexate  weekly - Continue folic acid 

## 2023-10-12 NOTE — Assessment & Plan Note (Signed)
 Likely secondary to falls.  No significant pain. x-ray showed acute mildly displaced fractures of the lateral right tenth rib and anterior left sixth rib and lateral left tenth rib.  -Continue with supportive care -Incentive spirometry

## 2023-10-12 NOTE — Assessment & Plan Note (Signed)
 PT is recommending SNF TOC was consulted

## 2023-10-12 NOTE — Assessment & Plan Note (Signed)
 Blood pressure currently within goal. - Continue with amlodipine  -As needed hydralazine 

## 2023-10-12 NOTE — Hospital Course (Addendum)
 Taken from prior notes.  Lynn Norton is a 73 y.o. female with medical history significant of  hypertension, GERD, rheumatoid arthritis, tobacco abuse, alcohol abuse in remission, PAD, CKD-4, UTI, E. coli bacteremia, who presents with fall.   Family reports multiple falls over the past 5 to 6 weeks.  Apparently she was not using her walker.  On presentation stable vital, labs with mild leukocytosis at 10.9, sodium 130.  Chest x-ray showed rib fracture. CT of head is negative for acute intracranial abnormalities. CT of C-spine negative for acute injury. CT of maxillofacial image negative for acute injury. X-ray of right hand negative for acute bony fracture. MRI of the brain negative for acute stroke.   8/20: Vital stable.  PT is recommending SNF.  Elevated vitamin D  level at 124, UA was not consistent with UTI and sodium improved to 134.  8/21: Remained hemodynamically stable, awaiting placement.  8/22: Remained hemodynamically stable, pending insurance authorization for rehab.  8/23:Remained stable, pending insurance for SNF.  8/24: Patient remained hemodynamically stable and is being discharged to SNF for rehab.  Patient is given some pain medication to use only if simple Tylenol  does not help.  Please avoid constipation while using opioids.  She will continue on current medications and need to have a close follow-up with her providers for further assistance.

## 2023-10-12 NOTE — TOC Transition Note (Signed)
 Transition of Care [facility]) - Discharge Note   Patient Details  Name: Lynn Norton MRN: [id number] Date of Birth: 1950/07/10  Transition of [facility] CM/SW Contact:  Marinda Cooks, RN Phone Number: 10/12/2023, 5:32 PM   Clinical Narrative:     This CM spoke with pt introduced role  and completed Initial assessment pt accompanied by [facility] who she gave permission to assist with initial assessment. Pt A&Ox4.  Pt reports living in a  ranch style home with 3 steps to enter. Pt has  support provided by .HH Aides Arland from 9-2 pm & Verneita from 6-10 pm Mon - Fri and when needed on the weekends this is private pay arrangement per pt.     Pt uses [facility] in [place] & PCP is located at [facility] pt unable to provide exact name during assessment intake. Pt has DME at home that includes RW, Rollator,Cane,Quad Cane, & BSC.   Pt reports not  being in SNF prior to  this admission or within the last 3 months or having HH services. Pt denies having any community resources  in place like meals on wheels or any other senior based programs .  DC Planning- This CM spoke with pt and about recommendation for rehab prior to returning home and pt was in agreement with plan. This CM sent bed referrals, awaiting bed offers.   TOC will cont to follow dc planning / care coordination  during pt's hospital stay and update as applicable.   Final next level of care: Skilled Nursing Facility Barriers to Discharge: No Barriers Identified   Patient Goals and CMS Choice Patient states their goals for this hospitalization and ongoing recovery are:: To DC to rehab prior to going back home     Discharge Placement  SNF/Rehab        Post Acute Care Choice: Skilled Nursing Facility              Social Drivers of Health (SDOH) Interventions SDOH Screenings   Food Insecurity: No Food Insecurity (10/11/2023)  Housing: Low Risk  (10/11/2023)  Transportation Needs: No Transportation Needs  (10/11/2023)  Utilities: Not At Risk (10/11/2023)  Financial Resource Strain: Low Risk  (06/29/2023)   Received from [facility]  Recent Concern: Financial Resource Strain - High Risk (05/25/2023)   Received from [facility]  Social Connections: Unknown (10/11/2023)  Tobacco Use: High Risk (10/10/2023)     Readmission Risk Interventions     No data to display

## 2023-10-12 NOTE — Care Management Obs Status (Signed)
 MEDICARE OBSERVATION STATUS NOTIFICATION   Patient Details  Name: Lynn Norton MRN: [id number] Date of Birth: 07-26-1950   Medicare Observation Status Notification Given:  Yes    Junious Ragone W, CMA 10/12/2023, 11:01 AM

## 2023-10-12 NOTE — [place] FL2 (Signed)
 [place]  MEDICAID [id number] LEVEL OF CARE FORM     IDENTIFICATION  Patient Name: Lynn Norton Birthdate: 12-11-1950 Sex: female Admission Date (Current Location): 10/10/2023  [facility] and [place] Number:  Chiropodist and Address:  [facility], [address] [place], [place] [address]      Provider Number: [id number]  Attending Physician Name and Address:  Caleen Qualia, MD  Relative Name and Phone Number:  Johnita Minors [phone]    Current Level of Care: Hospital Recommended Level of Care: Skilled Nursing Facility Prior Approval Number:    Date Approved/Denied:   PASRR Number:    Discharge Plan: SNF    Current Diagnoses: Patient Active Problem List   Diagnosis Date Noted   Lightheadedness 10/12/2023   Gait instability 10/12/2023   Closed rib fracture 10/11/2023   Protein-calorie malnutrition, severe (HCC) 10/11/2023   Acute hyponatremia 10/23/2022   Fall at home, initial encounter 10/22/2022   Encephalopathy 10/22/2022   Alcohol abuse 10/22/2022   UTI (urinary tract infection) 06/13/2021   CKD (chronic kidney disease) stage 4, GFR 15-29 ml/min (HCC) 06/13/2021   Protein-calorie malnutrition, moderate (HCC) 06/13/2021   Closed fracture of pubic ramus-left  06/13/2021   Lower back pain 06/13/2021   HTN (hypertension) 04/26/2021   Rheumatoid arthritis (HCC) 04/24/2021   Tobacco use 04/24/2021   Bacteremia due to Escherichia coli 04/23/2021   Sepsis (HCC) 04/22/2021   Closed fracture dislocation of right elbow 04/22/2021   AKI (acute kidney injury) (HCC) 04/22/2021   Hyponatremia 04/22/2021   Frequent falls 04/22/2021   Hypertension 03/16/2018   PAD (peripheral artery disease) (HCC) 03/16/2018   Tobacco use disorder 08/12/2014   Unspecified osteoarthritis, unspecified site 11/03/2011    Orientation RESPIRATION BLADDER Height & Weight     Self, Time, Situation, Place  Normal Continent Weight: 39.4 kg Height:      BEHAVIORAL SYMPTOMS/MOOD NEUROLOGICAL BOWEL NUTRITION STATUS      Continent Diet  AMBULATORY STATUS COMMUNICATION OF NEEDS Skin   Supervision Verbally Bruising (S/P Fall)                       Personal Care Assistance Level of Assistance  Total care       Total Care Assistance: Maximum assistance   Functional Limitations Info             SPECIAL CARE FACTORS FREQUENCY  PT (By licensed PT), OT (By licensed OT)     PT Frequency: 5x a wk OT Frequency: 5 x a wk            Contractures Contractures Info: Not present    Additional Factors Info  Code Status Code Status Info: DNR             Current Medications (10/12/2023):  This is the current hospital active medication list Current Facility-Administered Medications  Medication Dose Route Frequency Provider Last Rate Last Admin   acetaminophen  (TYLENOL ) tablet 650 mg  650 mg Oral Q6H PRN Niu, Xilin, MD   650 mg at 10/12/23 1116   albuterol  (PROVENTIL ) (2.5 MG/3ML) 0.083% nebulizer solution 3 mL  3 mL Inhalation QID PRN Davis, Hillary E, MD       alum & mag hydroxide-simeth (MAALOX/MYLANTA) 200-200-20 MG/5ML suspension 30 mL  30 mL Oral Q6H PRN Nicholaus Rolland BRAVO, MD       amLODipine  (NORVASC ) tablet 10 mg  10 mg Oral Daily Nicholaus Rolland E, MD   10 mg at 10/12/23 1110  dextromethorphan-guaiFENesin  (MUCINEX  DM) 30-600 MG per 12 hr tablet 1 tablet  1 tablet Oral BID PRN Niu, Xilin, MD       docusate sodium  (COLACE) capsule 100 mg  100 mg Oral BID Davis, Hillary E, MD   100 mg at 10/12/23 1112   feeding supplement (ENSURE PLUS HIGH PROTEIN) liquid 237 mL  237 mL Oral TID BM Von Bellis, MD   237 mL at 10/12/23 1600   folic acid  (FOLVITE ) tablet 1 mg  1 mg Oral Daily Niu, Xilin, MD   1 mg at 10/12/23 1109   hydrALAZINE  (APRESOLINE ) injection 5 mg  5 mg Intravenous Q2H PRN Niu, Xilin, MD       ibuprofen  (ADVIL ) tablet 600 mg  600 mg Oral Q8H PRN Davis, Hillary E, MD   600 mg at 10/11/23 1254   lidocaine  (LIDODERM ) 5  % 1 patch  1 patch Transdermal QHS Niu, Xilin, MD   1 patch at 10/11/23 2026   methocarbamol  (ROBAXIN ) tablet 500 mg  500 mg Oral Q8H PRN Niu, Xilin, MD       [START ON 10/13/2023] methotrexate  (RHEUMATREX) tablet 20 mg  20 mg Oral Q Thu Niu, Xilin, MD       multivitamin with minerals tablet 1 tablet  1 tablet Oral Daily Nicholaus Maize E, MD   1 tablet at 10/12/23 1110   nicotine  (NICODERM CQ  - dosed in mg/24 hours) patch 21 mg  21 mg Transdermal Daily Niu, Xilin, MD   21 mg at 10/12/23 1112   nicotine  polacrilex (NICORETTE ) gum 2 mg  2 mg Oral PRN Von Bellis, MD   2 mg at 10/11/23 1554   ondansetron  (ZOFRAN ) injection 4 mg  4 mg Intravenous Q8H PRN Niu, Xilin, MD       ondansetron  (ZOFRAN ) tablet 4 mg  4 mg Oral Q8H PRN Davis, Hillary E, MD       oxyCODONE -acetaminophen  (PERCOCET/ROXICET) 5-325 MG per tablet 1 tablet  1 tablet Oral Q6H PRN Niu, Xilin, MD       PARoxetine  (PAXIL ) tablet 10 mg  10 mg Oral Daily Nicholaus Maize E, MD   10 mg at 10/12/23 1110   sodium chloride  tablet 1 g  1 g Oral BID WC Niu, Xilin, MD   1 g at 10/12/23 1110   thiamine  (VITAMIN B1) tablet 100 mg  100 mg Oral Daily Nicholaus Maize BRAVO, MD   100 mg at 10/12/23 1110     Discharge Medications: Please see discharge summary for a list of discharge medications.  Relevant Imaging Results:  Relevant Lab Results:   Additional Information SS# [id number]  Marinda Cooks, RN

## 2023-10-12 NOTE — Assessment & Plan Note (Signed)
 Nicotine patch as needed

## 2023-10-12 NOTE — Progress Notes (Signed)
 Progress Note   Patient: Lynn Norton FMW:[id number] DOB: 19-Sep-1950 DOA: 10/10/2023     0 DOS: the patient was seen and examined on 10/12/2023   Brief hospital course: Taken from prior notes.  Sarayah Pralle is a 73 y.o. female with medical history significant of  hypertension, GERD, rheumatoid arthritis, tobacco abuse, alcohol abuse in remission, PAD, CKD-4, UTI, E. coli bacteremia, who presents with fall.   Family reports multiple falls over the past 5 to 6 weeks.  Apparently she was not using her walker.  On presentation stable vital, labs with mild leukocytosis at 10.9, sodium 130.  Chest x-ray showed rib fracture. CT of head is negative for acute intracranial abnormalities. CT of C-spine negative for acute injury. CT of maxillofacial image negative for acute injury. X-ray of right hand negative for acute bony fracture. MRI of the brain negative for acute stroke.   8/20: Vital stable.  PT is recommending SNF.  Elevated vitamin D  level at 124, UA was not consistent with UTI and sodium improved to 134.  Assessment and Plan: * Closed rib fracture Likely secondary to falls.  No significant pain. x-ray showed acute mildly displaced fractures of the lateral right tenth rib and anterior left sixth rib and lateral left tenth rib.  -Continue with supportive care -Incentive spirometry  Fall at home, initial encounter PT is recommending SNF TOC was consulted  Rheumatoid arthritis [facility]) Patient states she is not taking prednisone  currently. -pt is on methotrexate  weekly - Continue folic acid   Tobacco use - Nicotine  patch as needed  Hyponatremia Seems chronic.  Improved to 134 s/p normal saline -Continue with salt tablets  HTN (hypertension) Blood pressure currently within goal. - Continue with amlodipine  -As needed hydralazine   CKD (chronic kidney disease) stage 4, GFR 15-29 ml/min (HCC) CKD stage IV was listed in her chart but renal function has been improved with 3 GFR above  60 today. - Continue to monitor -Avoid nephrotoxin  Protein-calorie malnutrition, severe (HCC) Estimated body mass index is 15.89 kg/m as calculated from the following:   Height as of 10/22/22: 5' 2 (1.575 m).   Weight as of this encounter: 39.4 kg.   - Dietitian consult      Subjective: Patient was sitting comfortably in chair when seen today.  Denies any significant pain.  Physical Exam: Vitals:   10/11/23 1949 10/12/23 0423 10/12/23 0834 10/12/23 1610  BP: 116/66 (!) 162/88 (!) 138/57 129/68  Pulse: (!) 101 99 90 97  Resp: 18 18 16 16   Temp: 97.8 F (36.6 C) 98 F (36.7 C) 98.5 F (36.9 C) 98.2 F (36.8 C)  TempSrc:   Oral Oral  SpO2: 94% 100% 97% 97%  Weight:       General.  Frail and severely malnourished elderly lady, in no acute distress.  Bruising around right eye Pulmonary.  Lungs clear bilaterally, normal respiratory effort. CV.  Regular rate and rhythm, no JVD, rub or murmur. Abdomen.  Soft, nontender, nondistended, BS positive. CNS.  Alert and oriented .  No focal neurologic deficit. Extremities.  No edema, no cyanosis, pulses intact and symmetrical.   Data Reviewed: Prior data reviewed  Family Communication: Discussed with patient  Disposition: Status is: Observation The patient remains OBS appropriate and will d/c before 2 midnights.  Planned Discharge Destination: Skilled nursing facility  Time spent: 45 minutes  This record has been created using Conservation officer, historic buildings. Errors have been sought and corrected,but may not always be located. Such creation errors do not reflect  on the standard of care.   Author: Amaryllis Dare, MD 10/12/2023 4:36 PM  For on call review [email or web address].

## 2023-10-12 NOTE — Assessment & Plan Note (Signed)
 Estimated body mass index is 15.89 kg/m as calculated from the following:   Height as of 10/22/22: 5' 2 (1.575 m).   Weight as of this encounter: 39.4 kg.   - Dietitian consult

## 2023-10-13 DIAGNOSIS — R42 Dizziness and giddiness: Secondary | ICD-10-CM | POA: Diagnosis not present

## 2023-10-13 DIAGNOSIS — R296 Repeated falls: Secondary | ICD-10-CM | POA: Diagnosis not present

## 2023-10-13 DIAGNOSIS — R2681 Unsteadiness on feet: Secondary | ICD-10-CM | POA: Diagnosis not present

## 2023-10-13 DIAGNOSIS — S2249XA Multiple fractures of ribs, unspecified side, initial encounter for closed fracture: Secondary | ICD-10-CM | POA: Diagnosis not present

## 2023-10-13 NOTE — Progress Notes (Signed)
 Occupational Therapy Treatment Patient Details Name: Lynn Norton MRN: [id number] DOB: 07/24/1950 Today's Date: 10/13/2023   History of present illness 73 y.o. female with medical history significant of  hypertension, GERD, rheumatoid arthritis, tobacco abuse, alcohol abuse in remission, PAD, CKD-4, UTI, E. coli bacteremia, falls, who presents with fall.   OT comments  Pt seen for OT Tx. Friend present and supportive. Pt noted in paper scrubs top and mesh underwear. Provided with her pink robe which she required MIN A to don 2/2 impaired L shoulder ROM. Pt required MIN A to stand from the recliner, once in standing demo's poor balance requiring BUE support on the RW and CGA with PRN VC for RW mgt and physical assist for repositioning RW to avoid obstacles while ambulating to the bathroom. CGA to [facility] over toilet. Pt completed pericare in sitting with supervision and required MIN A for completing donning of underwear over hips for thoroughness. Pt returned to the recliner. Pt/friend educated in OT recommendations, falls prevention, need for ongoing therapy to improve overall safety and independence in order for pt to continue living alone. Pt/friend verbalized understanding. Pt continues to benefit from skilled OT services to maximize safety/indep and minimize falls risk.       If plan is discharge home, recommend the following:  A little help with walking and/or transfers;A lot of help with bathing/dressing/bathroom;Direct supervision/assist for medications management;Supervision due to cognitive status;Direct supervision/assist for financial management;Assistance with cooking/housework;Assist for transportation;Help with stairs or ramp for entrance   Equipment Recommendations  Other (comment) (defer)    Recommendations for Other Services      Precautions / Restrictions Precautions Precautions: Fall Recall of Precautions/Restrictions: Impaired Restrictions Weight Bearing Restrictions Per  Provider Order: No       Mobility Bed Mobility               General bed mobility comments: NT in recliner pre and post session    Transfers Overall transfer level: Needs assistance Equipment used: Rolling walker (2 wheels) Transfers: Sit to/from Stand Sit to Stand: Min assist                 Balance Overall balance assessment: Needs assistance Sitting-balance support: Feet supported, Single extremity supported Sitting balance-Leahy Scale: Fair     Standing balance support: Bilateral upper extremity supported, Reliant on assistive device for balance Standing balance-Leahy Scale: Poor                             ADL either performed or assessed with clinical judgement   ADL Overall ADL's : Needs assistance/impaired                 Upper Body Dressing : Sitting;Minimal assistance Upper Body Dressing Details (indicate cue type and reason): donning robe 2/2 LUE shoulder ROM impairments     Toilet Transfer: Contact guard assist;BSC/3in1;Regular Toilet;Rolling walker (2 wheels);Ambulation   Toileting- Clothing Manipulation and Hygiene: Minimal assistance;Sit to/from stand Toileting - Clothing Manipulation Details (indicate cue type and reason): MIN A for completing donning over hips/throughness     Functional mobility during ADLs: Contact guard assist;Rolling walker (2 wheels);Cueing for safety      Extremity/Trunk Assessment              Vision       Perception     Praxis     Communication Communication Communication: No apparent difficulties   Cognition Arousal: Alert Behavior During Therapy: [facility] for tasks  assessed/performed               OT - Cognition Comments: questionable safety awareness and difficulty with RW safety/mgt                 Following commands: Impaired Following commands impaired: Follows one step commands with increased time, Follows multi-step commands inconsistently      Cueing   Cueing  Techniques: Verbal cues, Gestural cues, Tactile cues, Visual cues  Exercises Other Exercises Other Exercises: Pt/friend educated in OT recommendations, falls prevention, need for ongoing therapy to improve overall safety and independence in order for pt to continue living alone    Shoulder Instructions       General Comments      Pertinent Vitals/ Pain       Pain Assessment Pain Assessment: Faces Faces Pain Scale: Hurts a little bit Pain Location: LUE with movement Pain Descriptors / Indicators: Grimacing, Guarding Pain Intervention(s): Monitored during session, Repositioned  Home Living                                          Prior Functioning/Environment              Frequency  Min 2X/week        Progress Toward Goals  OT Goals(current goals can now be found in the care plan section)  Progress towards OT goals: Progressing toward goals  Acute Rehab OT Goals Patient Stated Goal: pt wants to go home OT Goal Formulation: With patient Time For Goal Achievement: 10/25/23 Potential to Achieve Goals: Fair  Plan      Co-evaluation                 AM-PAC OT 6 Clicks Daily Activity     Outcome Measure   Help from another person eating meals?: None Help from another person taking care of personal grooming?: A Little Help from another person toileting, which includes using toliet, bedpan, or urinal?: A Little Help from another person bathing (including washing, rinsing, drying)?: A Lot Help from another person to put on and taking off regular upper body clothing?: A Lot Help from another person to put on and taking off regular lower body clothing?: A Lot 6 Click Score: 16    End of Session Equipment Utilized During Treatment: Rolling walker (2 wheels)  OT Visit Diagnosis: Other abnormalities of gait and mobility (R26.89);Repeated falls (R29.6);Muscle weakness (generalized) (M62.81);Pain Pain - Right/Left: Left Pain - part of body:  Shoulder   Activity Tolerance Patient tolerated treatment well   Patient Left in chair;with call bell/phone within reach;with chair alarm set;with family/visitor present   Nurse Communication          Time: 8468-8454 OT Time Calculation (min): 14 min  Charges: OT General Charges $OT Visit: 1 Visit OT Treatments $Self Care/Home Management : 8-22 mins  Warren SAUNDERS., MPH, MS, OTR/L ascom [phone] 10/13/23, 4:47 PM

## 2023-10-13 NOTE — Progress Notes (Signed)
 Progress Note   Patient: Lynn Norton FMW:[id number] DOB: [birth year] DOA: 10/10/2023     0 DOS: the patient was seen and examined on 10/13/2023   Brief hospital course: Taken from prior notes.  Lynn Norton is a 73 y.o. female with medical history significant of  hypertension, GERD, rheumatoid arthritis, tobacco abuse, alcohol abuse in remission, PAD, CKD-4, UTI, E. coli bacteremia, who presents with fall.   Family reports multiple falls over the past 5 to 6 weeks.  Apparently she was not using her walker.  On presentation stable vital, labs with mild leukocytosis at 10.9, sodium 130.  Chest x-ray showed rib fracture. CT of head is negative for acute intracranial abnormalities. CT of C-spine negative for acute injury. CT of maxillofacial image negative for acute injury. X-ray of right hand negative for acute bony fracture. MRI of the brain negative for acute stroke.   8/20: Vital stable.  PT is recommending SNF.  Elevated vitamin D  level at 124, UA was not consistent with UTI and sodium improved to 134.  8/21: Remained hemodynamically stable, awaiting placement.  Assessment and Plan: * Closed rib fracture Likely secondary to falls.  No significant pain. x-ray showed acute mildly displaced fractures of the lateral right tenth rib and anterior left sixth rib and lateral left tenth rib.  -Continue with supportive care -Incentive spirometry  Fall at home, initial encounter PT is recommending SNF TOC was consulted-awaiting placement  Rheumatoid arthritis [facility]) Patient states she is not taking prednisone  currently. -pt is on methotrexate  weekly - Continue folic acid   Tobacco use - Nicotine  patch as needed  Hyponatremia Seems chronic.  Improved to 134 s/p normal saline -Continue with salt tablets  HTN (hypertension) Blood pressure currently within goal. - Continue with amlodipine  -As needed hydralazine   CKD (chronic kidney disease) stage 4, GFR 15-29 ml/min (HCC) CKD stage  IV was listed in her chart but renal function has been improved with 3 GFR above 60 today. - Continue to monitor -Avoid nephrotoxin  Protein-calorie malnutrition, severe (HCC) Estimated body mass index is 15.89 kg/m as calculated from the following:   Height as of 10/22/22: 5' 2 (1.575 m).   Weight as of this encounter: 39.4 kg.   - Dietitian consult      Subjective: Patient was seen and examined today.  No new concern.  Pain seems well-controlled.  Physical Exam: Vitals:   10/12/23 1610 10/12/23 1951 10/13/23 0521 10/13/23 0821  BP: 129/68 (!) 118/56 133/72 116/72  Pulse: 97 94 97 99  Resp: 16 18 15 18   Temp: 98.2 F (36.8 C) 98.7 F (37.1 C) 98.3 F (36.8 C) 98 F (36.7 C)  TempSrc: Oral     SpO2: 97% 96% 92% 94%  Weight:       General.  Frail and severely malnourished elderly lady, in no acute distress.  Ecchymosis surrounding right eye Pulmonary.  Lungs clear bilaterally, normal respiratory effort. CV.  Regular rate and rhythm, no JVD, rub or murmur. Abdomen.  Soft, nontender, nondistended, BS positive. CNS.  Alert and oriented .  No focal neurologic deficit. Extremities.  No edema,  pulses intact and symmetrical. Psychiatry.  Judgment and insight appears normal.    Data Reviewed: Prior data reviewed  Family Communication: Discussed with patient  Disposition: Status is: Observation The patient remains OBS appropriate and will d/c before 2 midnights.  Planned Discharge Destination: Skilled nursing facility  Time spent: 44 minutes  This record has been created using Conservation officer, historic buildings. Errors have been sought  and corrected,but may not always be located. Such creation errors do not reflect on the standard of care.   Author: Amaryllis Dare, MD 10/13/2023 4:05 PM  For on call review [email or web address].

## 2023-10-13 NOTE — Assessment & Plan Note (Signed)
 Seems chronic.  Improved to 134 s/p normal saline -Continue with salt tablets

## 2023-10-13 NOTE — Assessment & Plan Note (Signed)
 Likely secondary to falls.  No significant pain. x-ray showed acute mildly displaced fractures of the lateral right tenth rib and anterior left sixth rib and lateral left tenth rib.  -Continue with supportive care -Incentive spirometry

## 2023-10-13 NOTE — Plan of Care (Signed)
  Problem: Education: Goal: Knowledge of General Education information will improve Description: Including pain rating scale, medication(s)/side effects and non-pharmacologic comfort measures Outcome: Progressing   Problem: Clinical Measurements: Goal: Will remain free from infection Outcome: Progressing Goal: Diagnostic test results will improve Outcome: Progressing   Problem: Coping: Goal: Level of anxiety will decrease Outcome: Progressing   

## 2023-10-13 NOTE — Plan of Care (Signed)
  Problem: Clinical Measurements: Goal: Will remain free from infection Outcome: Progressing   Problem: Activity: Goal: Risk for activity intolerance will decrease Outcome: Progressing   

## 2023-10-13 NOTE — Assessment & Plan Note (Signed)
 PT is recommending SNF TOC was consulted-awaiting placement

## 2023-10-14 DIAGNOSIS — R296 Repeated falls: Secondary | ICD-10-CM | POA: Diagnosis not present

## 2023-10-14 DIAGNOSIS — R42 Dizziness and giddiness: Secondary | ICD-10-CM | POA: Diagnosis not present

## 2023-10-14 DIAGNOSIS — S2249XA Multiple fractures of ribs, unspecified side, initial encounter for closed fracture: Secondary | ICD-10-CM | POA: Diagnosis not present

## 2023-10-14 DIAGNOSIS — R2681 Unsteadiness on feet: Secondary | ICD-10-CM | POA: Diagnosis not present

## 2023-10-14 MED ORDER — ENOXAPARIN SODIUM 30 MG/0.3ML IJ SOSY
30.0000 mg | PREFILLED_SYRINGE | INTRAMUSCULAR | Status: DC
  Administered 2023-10-14 – 2023-10-17 (×4): 30 mg via SUBCUTANEOUS
  Filled 2023-10-14 (×4): qty 0.3

## 2023-10-14 MED ORDER — ENSURE PLUS HIGH PROTEIN PO LIQD
237.0000 mL | Freq: Two times a day (BID) | ORAL | Status: DC
  Administered 2023-10-14 – 2023-10-17 (×6): 237 mL via ORAL

## 2023-10-14 NOTE — Progress Notes (Signed)
 Physical Therapy Treatment Patient Details Name: Lynn Norton MRN: [id number] DOB: [birth year] Today's Date: 10/14/2023   History of Present Illness 73 y.o. female with medical history significant of  hypertension, GERD, rheumatoid arthritis, tobacco abuse, alcohol abuse in remission, PAD, CKD-4, UTI, E. coli bacteremia, falls, who presents with fall.    PT Comments  Pt in chair, ready to walk.  She stands with cga/min a x 1 mostly needing cues to get to edge of chair and hand placements.  She is able to walk 15' x 2 with RW and min a x 1 but does seem to self limit gait with reports of general weakness in BLE;s where she sits on EOB after walking around bed.  She returned to chair after short rest.  Seated AROM but she declined further gait at this time due to fatigue.   If plan is discharge home, recommend the following: A little help with walking and/or transfers;A little help with bathing/dressing/bathroom;Assistance with cooking/housework;Direct supervision/assist for medications management;Assist for transportation;Help with stairs or ramp for entrance   Can travel by private vehicle        Equipment Recommendations       Recommendations for Other Services       Precautions / Restrictions Precautions Precautions: Fall Recall of Precautions/Restrictions: Impaired Restrictions Weight Bearing Restrictions Per Provider Order: No     Mobility  Bed Mobility               General bed mobility comments: NT in recliner pre and post session Patient Response: Cooperative, Flat affect  Transfers Overall transfer level: Needs assistance Equipment used: Rolling walker (2 wheels) Transfers: Sit to/from Stand Sit to Stand: Contact guard assist, Min assist                Ambulation/Gait Ambulation/Gait assistance: Min assist Gait Distance (Feet): 15 Feet Assistive device: Rolling walker (2 wheels) Gait Pattern/deviations: Step-through pattern, Decreased step length -  right, Decreased step length - left, Shuffle Gait velocity: dec     General Gait Details: short shuffling steps, self limits gait due to weak legs   Stairs             Wheelchair Mobility     Tilt Bed Tilt Bed Patient Response: Cooperative, Flat affect  Modified Rankin (Stroke Patients Only)       Balance Overall balance assessment: Needs assistance Sitting-balance support: Feet supported, Single extremity supported Sitting balance-Leahy Scale: Fair     Standing balance support: Bilateral upper extremity supported, Reliant on assistive device for balance, Single extremity supported, During functional activity Standing balance-Leahy Scale: Poor Standing balance comment: BUE support while walking with RW and +1 hands on assist at all times                            Communication Communication Communication: No apparent difficulties  Cognition Arousal: Alert Behavior During Therapy: Flat affect   PT - Cognitive impairments: Safety/Judgement, Problem solving, Sequencing                         Following commands: Impaired Following commands impaired: Follows one step commands with increased time, Follows multi-step commands inconsistently    Cueing Cueing Techniques: Verbal cues, Gestural cues, Tactile cues, Visual cues  Exercises Other Exercises Other Exercises: seated AROM    General Comments        Pertinent Vitals/Pain Pain Assessment Pain Assessment: Faces Faces Pain Scale: Hurts  a little bit Pain Location: LUE with movement Pain Descriptors / Indicators: Grimacing, Guarding Pain Intervention(s): Limited activity within patient's tolerance, Monitored during session    Home Living                          Prior Function            PT Goals (current goals can now be found in the care plan section) Progress towards PT goals: Progressing toward goals    Frequency    Min 2X/week      PT Plan       Co-evaluation              AM-PAC PT 6 Clicks Mobility   Outcome Measure  Help needed turning from your back to your side while in a flat bed without using bedrails?: A Little Help needed moving from lying on your back to sitting on the side of a flat bed without using bedrails?: A Little Help needed moving to and from a bed to a chair (including a wheelchair)?: A Little Help needed standing up from a chair using your arms (e.g., wheelchair or bedside chair)?: A Little Help needed to walk in hospital room?: A Little Help needed climbing 3-5 steps with a railing? : A Lot 6 Click Score: 17    End of Session Equipment Utilized During Treatment: Gait belt Activity Tolerance: Patient tolerated treatment well Patient left: in chair;with call bell/phone within reach;with chair alarm set Nurse Communication: Mobility status PT Visit Diagnosis: Unsteadiness on feet (R26.81);Repeated falls (R29.6);Muscle weakness (generalized) (M62.81);Difficulty in walking, not elsewhere classified (R26.2);Pain Pain - Right/Left: Left Pain - part of body: Arm     Time: 8971-8963 PT Time Calculation (min) (ACUTE ONLY): 8 min  Charges:    $Gait Training: 8-22 mins PT General Charges $$ ACUTE PT VISIT: 1 Visit                   Lauraine Gills, PTA 10/14/23, 11:07 AM

## 2023-10-14 NOTE — Progress Notes (Signed)
 Progress Note   Patient: Lynn Norton FMW:[id number] DOB: 01/20/1951 DOA: 10/10/2023     0 DOS: the patient was seen and examined on 10/14/2023   Brief hospital course: Taken from prior notes.  Lynn Norton is a 73 y.o. female with medical history significant of  hypertension, GERD, rheumatoid arthritis, tobacco abuse, alcohol abuse in remission, PAD, CKD-4, UTI, E. coli bacteremia, who presents with fall.   Family reports multiple falls over the past 5 to 6 weeks.  Apparently she was not using her walker.  On presentation stable vital, labs with mild leukocytosis at 10.9, sodium 130.  Chest x-ray showed rib fracture. CT of head is negative for acute intracranial abnormalities. CT of C-spine negative for acute injury. CT of maxillofacial image negative for acute injury. X-ray of right hand negative for acute bony fracture. MRI of the brain negative for acute stroke.   8/20: Vital stable.  PT is recommending SNF.  Elevated vitamin D  level at 124, UA was not consistent with UTI and sodium improved to 134.  8/21: Remained hemodynamically stable, awaiting placement.  8/22: Remained hemodynamically stable, pending insurance authorization for rehab.  Assessment and Plan: * Closed rib fracture Likely secondary to falls.  No significant pain. x-ray showed acute mildly displaced fractures of the lateral right tenth rib and anterior left sixth rib and lateral left tenth rib.  -Continue with supportive care -Incentive spirometry  Fall at home, initial encounter PT is recommending SNF TOC was consulted-awaiting placement  Rheumatoid arthritis [facility]) Patient states she is not taking prednisone  currently. -pt is on methotrexate  weekly - Continue folic acid   Tobacco use - Nicotine  patch as needed  Hyponatremia Seems chronic.  Improved to 134 s/p normal saline -Continue with salt tablets  HTN (hypertension) Blood pressure currently within goal. - Continue with amlodipine  -As needed  hydralazine   CKD (chronic kidney disease) stage 4, GFR 15-29 ml/min (HCC) CKD stage IV was listed in her chart but renal function has been improved with 3 GFR above 60 today. - Continue to monitor -Avoid nephrotoxin  Protein-calorie malnutrition, severe (HCC) Estimated body mass index is 15.89 kg/m as calculated from the following:   Height as of 10/22/22: 5' 2 (1.575 m).   Weight as of this encounter: 39.4 kg.   - Dietitian consult      Subjective: Patient was sitting comfortably in chair when seen today.  No new concern  Physical Exam: Vitals:   10/13/23 1633 10/13/23 2034 10/14/23 0526 10/14/23 0846  BP: 128/65 (!) 141/75 133/62 128/64  Pulse: 94 94 88 94  Resp: 16 16 16 18   Temp: 98.4 F (36.9 C) 98.4 F (36.9 C) 98.6 F (37 C) 98.3 F (36.8 C)  TempSrc:    Oral  SpO2: 96% 93% 92% 93%  Weight:      Height: 5' (1.524 m)      General. Frail and malnourished elderly lady, in no acute distress. Pulmonary.  Lungs clear bilaterally, normal respiratory effort. CV.  Regular rate and rhythm, no JVD, rub or murmur. Abdomen.  Soft, nontender, nondistended, BS positive. CNS.  Alert and oriented .  No focal neurologic deficit. Extremities.  No edema, no cyanosis, pulses intact and symmetrical. Psychiatry.  Judgment and insight appears normal.     Data Reviewed: Prior data reviewed  Family Communication: Discussed with patient  Disposition: Status is: Observation The patient remains OBS appropriate and will d/c before 2 midnights.  Planned Discharge Destination: Skilled nursing facility  Time spent: 42 minutes  This record  has been created using Conservation officer, historic buildings. Errors have been sought and corrected,but may not always be located. Such creation errors do not reflect on the standard of care.   Author: Amaryllis Dare, MD 10/14/2023 3:27 PM  For on call review [email or web address].

## 2023-10-14 NOTE — Progress Notes (Signed)
 Occupational Therapy Treatment Patient Details Name: Lynn Norton MRN: [id number] DOB: [birth year] Today's Date: 10/14/2023   History of present illness 73 y.o. female with medical history significant of  hypertension, GERD, rheumatoid arthritis, tobacco abuse, alcohol abuse in remission, PAD, CKD-4, UTI, E. coli bacteremia, falls, who presents with fall.   OT comments  Pt received in the recliner, agreeable to session. Pt required VC for immediate recall of planned grooming tasks at the sink once standing from recliner. VC for anterior weight shift prior to standing improved pt STS from MIN A to CGA with RW. Pt continues to keep LUE protected at her side in sitting. Pt very unsteady while ambulating with rW around the end of the bed and to the sink, requiring intermittent physical assist for RW positioning and she tends to lilt to one side then the other. Tolerated standing at the sink with  UE support for balance, requiring set up assist for grooming. Pt endorsed fatigue at end of session, returned to recliner and set up required for breakfast. Pt continues to benefit from skilled OT services.       If plan is discharge home, recommend the following:  A little help with walking and/or transfers;A lot of help with bathing/dressing/bathroom;Direct supervision/assist for medications management;Supervision due to cognitive status;Direct supervision/assist for financial management;Assistance with cooking/housework;Assist for transportation;Help with stairs or ramp for entrance   Equipment Recommendations  Other (comment) (defer)    Recommendations for Other Services      Precautions / Restrictions Precautions Precautions: Fall Recall of Precautions/Restrictions: Impaired Restrictions Weight Bearing Restrictions Per Provider Order: No       Mobility Bed Mobility               General bed mobility comments: NT in recliner pre and post session    Transfers Overall transfer level:  Needs assistance Equipment used: Rolling walker (2 wheels) Transfers: Sit to/from Stand Sit to Stand: Contact guard assist, Min assist           General transfer comment: VC for anterior weight shift, keeps LUE pressed to her side     Balance Overall balance assessment: Needs assistance Sitting-balance support: Feet supported, Single extremity supported Sitting balance-Leahy Scale: Fair     Standing balance support: Bilateral upper extremity supported, Reliant on assistive device for balance, Single extremity supported, During functional activity Standing balance-Leahy Scale: Poor Standing balance comment: BUE support while walking with RW, able to use LUE on counter for stability while brushing her teeth                           ADL either performed or assessed with clinical judgement   ADL Overall ADL's : Needs assistance/impaired Eating/Feeding: Sitting;Set up Eating/Feeding Details (indicate cue type and reason): set up to cut pancakes and open containers/packets and ensure Grooming: Standing;Wash/dry face;Oral care;Set up;Contact guard assist Grooming Details (indicate cue type and reason): VC for initiation/sequencing, required assist to apply toothpaste to brush                             Functional mobility during ADLs: Contact guard assist;Rolling walker (2 wheels);Cueing for safety      Extremity/Trunk Assessment              Vision       Perception     Praxis     Communication Communication Communication: No apparent difficulties   Cognition  Arousal: Alert Behavior During Therapy: Flat affect Cognition: No family/caregiver present to determine baseline, Cognition impaired     Awareness: Online awareness impaired, Intellectual awareness impaired Memory impairment (select all impairments): Short-term memory, Declarative long-term memory, Working Biochemist, clinical functioning impairment (select all impairments): Initiation,  Sequencing, Reasoning, Problem solving                   Following commands: Impaired Following commands impaired: Follows one step commands with increased time, Follows multi-step commands inconsistently      Cueing   Cueing Techniques: Verbal cues, Gestural cues, Tactile cues, Visual cues  Exercises      Shoulder Instructions       General Comments      Pertinent Vitals/ Pain       Pain Assessment Pain Assessment: Faces Faces Pain Scale: Hurts a little bit Pain Location: LUE with movement Pain Descriptors / Indicators: Grimacing, Guarding Pain Intervention(s): Limited activity within patient's tolerance, Monitored during session, Repositioned  Home Living                                          Prior Functioning/Environment              Frequency  Min 2X/week        Progress Toward Goals  OT Goals(current goals can now be found in the care plan section)  Progress towards OT goals: Progressing toward goals  Acute Rehab OT Goals Patient Stated Goal: pt wants to go home OT Goal Formulation: With patient Time For Goal Achievement: 10/25/23 Potential to Achieve Goals: Fair  Plan      Co-evaluation                 AM-PAC OT 6 Clicks Daily Activity     Outcome Measure   Help from another person eating meals?: None Help from another person taking care of personal grooming?: A Little Help from another person toileting, which includes using toliet, bedpan, or urinal?: A Little Help from another person bathing (including washing, rinsing, drying)?: A Lot Help from another person to put on and taking off regular upper body clothing?: A Lot Help from another person to put on and taking off regular lower body clothing?: A Lot 6 Click Score: 16    End of Session Equipment Utilized During Treatment: Rolling walker (2 wheels)  OT Visit Diagnosis: Other abnormalities of gait and mobility (R26.89);Repeated falls (R29.6);Muscle  weakness (generalized) (M62.81);Pain Pain - Right/Left: Left Pain - part of body: Shoulder   Activity Tolerance Patient tolerated treatment well   Patient Left in chair;with call bell/phone within reach;with chair alarm set   Nurse Communication          Time: 9095-9084 OT Time Calculation (min): 11 min  Charges: OT General Charges $OT Visit: 1 Visit OT Treatments $Self Care/Home Management : 8-22 mins  Warren SAUNDERS., MPH, MS, OTR/L ascom [phone] 10/14/23, 9:26 AM

## 2023-10-14 NOTE — Progress Notes (Signed)
 Nutrition Follow-up  DOCUMENTATION CODES:   Severe malnutrition in context of social or environmental circumstances, Underweight  INTERVENTION:   -Continue regular diet for widest variety of meal selections -Continue MVI with minerals daily -Decrease Ensure Plus High Protein po to BID, each supplement provides 350 kcal and 20 grams of protein   NUTRITION DIAGNOSIS:   Severe Malnutrition related to social / environmental circumstances as evidenced by moderate fat depletion, severe fat depletion, moderate muscle depletion, severe muscle depletion.  Ongoing  GOAL:   Patient will meet greater than or equal to 90% of their needs  Progressing   MONITOR:   PO intake, Supplement acceptance  REASON FOR ASSESSMENT:   Consult Assessment of nutrition requirement/status  ASSESSMENT:   Pt with medical history significant of  hypertension, GERD, rheumatoid arthritis, tobacco abuse, alcohol abuse in remission, PAD, CKD-4, UTI, E. coli bacteremia, who presents with fall.  Reviewed I/O's: +360 ml x 24 hours and +1.6 L since admission   Pt sitting up in recliner chair, eating breakfast at time of visit. Pt reports feeling better and having improved appetite, eating most of her meals. Noted meal completions 65-100%. Pt drinking Ensure supplements, which she says she likes. She consumed about 75% of her pancakes when visit concluded.   Discussed importance of good meal and supplement intake to promote healing.   Medications reviewed and include colace, lovenox , folic acid , sodium chloride , and thiamine .   Per TOC notes, plan to SNF at discharge.   Labs reviewed: Na: 134.    Diet Order:   Diet Order             Diet regular Fluid consistency: Thin; Fluid restriction: 1500 mL Fluid  Diet effective now                   EDUCATION NEEDS:   Education needs have been addressed  Skin:  Skin Assessment: Reviewed RN Assessment  Last BM:  10/14/23 (type 4)  Height:   Ht  Readings from Last 1 Encounters:  10/13/23 5' (1.524 m)    Weight:   Wt Readings from Last 1 Encounters:  10/11/23 39.4 kg    Ideal Body Weight:  50 kg  BMI:  Body mass index is 16.96 kg/m.  Estimated Nutritional Needs:   Kcal:  1550-1750  Protein:  75-90 grams  Fluid:  1.5-1.7 L    Margery ORN, RD, LDN, CDCES Registered Dietitian III Certified Diabetes Care and Education Specialist If unable to reach this RD, please use RD Inpatient group chat on secure chat between hours of 8am-4 pm daily

## 2023-10-14 NOTE — Plan of Care (Signed)

## 2023-10-14 NOTE — TOC Progression Note (Signed)
 Transition of Care [facility]) - Progression Note    Patient Details  Name: Lynn Norton MRN: [id number] Date of Birth: [birth year]  Transition of Care [facility]) CM/SW Contact  [place] L Charina Fons, [place] Phone Number: 10/14/2023, 11:54 AM  Clinical Narrative:     CSW met with patient at bedside. CSW reviewed bed offers for SNF. Patient advised that she would like to go to [facility]. Shara will be started for [facility].     Barriers to Discharge: No Barriers Identified               Expected Discharge Plan and Services     Post Acute Care Choice: Skilled Nursing Facility Living arrangements for the past 2 months: Single Family Home Expected Discharge Date: 10/14/23                                     Social Drivers of Health (SDOH) Interventions SDOH Screenings   Food Insecurity: No Food Insecurity (10/11/2023)  Housing: Low Risk  (10/11/2023)  Transportation Needs: No Transportation Needs (10/11/2023)  Utilities: Not At Risk (10/11/2023)  Financial Resource Strain: Low Risk  (06/29/2023)   Received from [facility]  Recent Concern: Financial Resource Strain - High Risk (05/25/2023)   Received from [facility]  Social Connections: Unknown (10/11/2023)  Tobacco Use: High Risk (10/10/2023)    Readmission Risk Interventions     No data to display

## 2023-10-15 DIAGNOSIS — S2249XA Multiple fractures of ribs, unspecified side, initial encounter for closed fracture: Secondary | ICD-10-CM | POA: Diagnosis not present

## 2023-10-15 DIAGNOSIS — R2681 Unsteadiness on feet: Secondary | ICD-10-CM | POA: Diagnosis not present

## 2023-10-15 DIAGNOSIS — R42 Dizziness and giddiness: Secondary | ICD-10-CM | POA: Diagnosis not present

## 2023-10-15 DIAGNOSIS — R296 Repeated falls: Secondary | ICD-10-CM | POA: Diagnosis not present

## 2023-10-15 LAB — GLUCOSE, CAPILLARY: Glucose-Capillary: 106 mg/dL — ABNORMAL HIGH (ref 70–99)

## 2023-10-15 NOTE — Plan of Care (Signed)

## 2023-10-15 NOTE — Plan of Care (Signed)
  Problem: Education: Goal: Knowledge of General Education information will improve Description: Including pain rating scale, medication(s)/side effects and non-pharmacologic comfort measures Outcome: Progressing   Problem: Health Behavior/Discharge Planning: Goal: Ability to manage health-related needs will improve Outcome: Progressing   Problem: Clinical Measurements: Goal: Ability to maintain clinical measurements within normal limits will improve Outcome: Progressing Goal: Will remain free from infection Outcome: Progressing Goal: Diagnostic test results will improve Outcome: Progressing Goal: Respiratory complications will improve Outcome: Progressing Goal: Cardiovascular complication will be avoided Outcome: Progressing   Problem: Elimination: Goal: Will not experience complications related to bowel motility Outcome: Progressing Goal: Will not experience complications related to urinary retention Outcome: Progressing   Problem: Pain Managment: Goal: General experience of comfort will improve and/or be controlled Outcome: Progressing   Problem: Safety: Goal: Ability to remain free from injury will improve Outcome: Progressing

## 2023-10-15 NOTE — Progress Notes (Signed)
 Progress Note   Patient: Lynn Norton FMW:[id number] DOB: 10-24-1950 DOA: 10/10/2023     0 DOS: the patient was seen and examined on 10/15/2023   Brief hospital course: Taken from prior notes.  Lynn Norton is a 73 y.o. female with medical history significant of  hypertension, GERD, rheumatoid arthritis, tobacco abuse, alcohol abuse in remission, PAD, CKD-4, UTI, E. coli bacteremia, who presents with fall.   Family reports multiple falls over the past 5 to 6 weeks.  Apparently she was not using her walker.  On presentation stable vital, labs with mild leukocytosis at 10.9, sodium 130.  Chest x-ray showed rib fracture. CT of head is negative for acute intracranial abnormalities. CT of C-spine negative for acute injury. CT of maxillofacial image negative for acute injury. X-ray of right hand negative for acute bony fracture. MRI of the brain negative for acute stroke.   8/20: Vital stable.  PT is recommending SNF.  Elevated vitamin D  level at 124, UA was not consistent with UTI and sodium improved to 134.  8/21: Remained hemodynamically stable, awaiting placement.  8/22: Remained hemodynamically stable, pending insurance authorization for rehab.  8/23:Remained stable, pending insurance for SNF.  Assessment and Plan: * Closed rib fracture Likely secondary to falls.  No significant pain. x-ray showed acute mildly displaced fractures of the lateral right tenth rib and anterior left sixth rib and lateral left tenth rib.  -Continue with supportive care -Incentive spirometry  Fall at home, initial encounter PT is recommending SNF TOC was consulted-awaiting placement  Rheumatoid arthritis [facility]) Patient states she is not taking prednisone  currently. -pt is on methotrexate  weekly - Continue folic acid   Tobacco use - Nicotine  patch as needed  Hyponatremia Seems chronic.  Improved to 134 s/p normal saline -Continue with salt tablets  HTN (hypertension) Blood pressure currently  within goal. - Continue with amlodipine  -As needed hydralazine   CKD (chronic kidney disease) stage 4, GFR 15-29 ml/min (HCC) CKD stage IV was listed in her chart but renal function has been improved with 3 GFR above 60 today. - Continue to monitor -Avoid nephrotoxin  Protein-calorie malnutrition, severe (HCC) Estimated body mass index is 15.89 kg/m as calculated from the following:   Height as of 10/22/22: 5' 2 (1.575 m).   Weight as of this encounter: 39.4 kg.   - Dietitian consult      Subjective: Patient was lying comfortably in bed when seen today.  No new concern.  Denies any significant pain.  Physical Exam: Vitals:   10/14/23 1630 10/14/23 1941 10/15/23 0333 10/15/23 0743  BP: 131/61 137/71 121/77 (!) 118/93  Pulse: 89 77 82 100  Resp: 18 16 16 16   Temp: 97.8 F (36.6 C) 98.1 F (36.7 C) 98.3 F (36.8 C) 98.5 F (36.9 C)  TempSrc:  Oral Oral Oral  SpO2: 95% 99% 98% 96%  Weight:      Height:       General.  Malnourished elderly lady, in no acute distress. Pulmonary.  Lungs clear bilaterally, normal respiratory effort. CV.  Regular rate and rhythm, no JVD, rub or murmur. Abdomen.  Soft, nontender, nondistended, BS positive. CNS.  Alert and oriented .  No focal neurologic deficit. Extremities.  No edema, no cyanosis, pulses intact and symmetrical. Psychiatry.  Judgment and insight appears normal.      Data Reviewed: Prior data reviewed  Family Communication: Discussed with patient  Disposition: Status is: Observation The patient remains OBS appropriate and will d/c before 2 midnights.  Planned Discharge Destination: Skilled  nursing facility  Time spent: 40 minutes  This record has been created using Conservation officer, historic buildings. Errors have been sought and corrected,but may not always be located. Such creation errors do not reflect on the standard of care.   Author: Amaryllis Dare, MD 10/15/2023 2:50 PM  For on call review [email or web address].

## 2023-10-16 DIAGNOSIS — S2249XA Multiple fractures of ribs, unspecified side, initial encounter for closed fracture: Secondary | ICD-10-CM | POA: Diagnosis not present

## 2023-10-16 DIAGNOSIS — R42 Dizziness and giddiness: Secondary | ICD-10-CM | POA: Diagnosis not present

## 2023-10-16 DIAGNOSIS — R2681 Unsteadiness on feet: Secondary | ICD-10-CM | POA: Diagnosis not present

## 2023-10-16 DIAGNOSIS — R296 Repeated falls: Secondary | ICD-10-CM | POA: Diagnosis not present

## 2023-10-16 NOTE — Progress Notes (Signed)
 Progress Note   Patient: Lynn Norton FMW:[id number] DOB: [birth year] DOA: 10/10/2023     0 DOS: the patient was seen and examined on 10/16/2023   Brief hospital course: Taken from prior notes.  Lynn Norton is a 73 y.o. female with medical history significant of  hypertension, GERD, rheumatoid arthritis, tobacco abuse, alcohol abuse in remission, PAD, CKD-4, UTI, E. coli bacteremia, who presents with fall.   Family reports multiple falls over the past 5 to 6 weeks.  Apparently she was not using her walker.  On presentation stable vital, labs with mild leukocytosis at 10.9, sodium 130.  Chest x-ray showed rib fracture. CT of head is negative for acute intracranial abnormalities. CT of C-spine negative for acute injury. CT of maxillofacial image negative for acute injury. X-ray of right hand negative for acute bony fracture. MRI of the brain negative for acute stroke.   8/20: Vital stable.  PT is recommending SNF.  Elevated vitamin D  level at 124, UA was not consistent with UTI and sodium improved to 134.  8/21: Remained hemodynamically stable, awaiting placement.  8/22: Remained hemodynamically stable, pending insurance authorization for rehab.  8/23:Remained stable, pending insurance for SNF.  Assessment and Plan: * Closed rib fracture Likely secondary to falls.  No significant pain. x-ray showed acute mildly displaced fractures of the lateral right tenth rib and anterior left sixth rib and lateral left tenth rib.  -Continue with supportive care -Incentive spirometry  Fall at home, initial encounter PT is recommending SNF TOC was consulted-awaiting placement  Rheumatoid arthritis [facility]) Patient states she is not taking prednisone  currently. -pt is on methotrexate  weekly - Continue folic acid   Tobacco use - Nicotine  patch as needed  Hyponatremia Seems chronic.  Improved to 134 s/p normal saline -Continue with salt tablets  HTN (hypertension) Blood pressure currently  within goal. - Continue with amlodipine  -As needed hydralazine   CKD (chronic kidney disease) stage 4, GFR 15-29 ml/min (HCC) CKD stage IV was listed in her chart but renal function has been improved with 3 GFR above 60 today. - Continue to monitor -Avoid nephrotoxin  Protein-calorie malnutrition, severe (HCC) Estimated body mass index is 15.89 kg/m as calculated from the following:   Height as of 10/22/22: 5' 2 (1.575 m).   Weight as of this encounter: 39.4 kg.   - Dietitian consult      Subjective: Patient was sleeping when seen today.  No new concern.  Easily arousable.  Physical Exam: Vitals:   10/15/23 1519 10/15/23 1913 10/16/23 0338 10/16/23 0907  BP: 131/64 137/65 125/74 (!) 142/75  Pulse: 87 90 95 86  Resp: 18 18 16 17   Temp: 98.5 F (36.9 C) 98.4 F (36.9 C) 98.2 F (36.8 C) 99.3 F (37.4 C)  TempSrc: Oral Oral Oral Oral  SpO2: 97% 99% 93% 94%  Weight:      Height:       General.  Frail and malnourished elderly lady, in no acute distress. Pulmonary.  Lungs clear bilaterally, normal respiratory effort. CV.  Regular rate and rhythm, no JVD, rub or murmur. Abdomen.  Soft, nontender, nondistended, BS positive. CNS.  Somnolent.  No focal neurologic deficit. Extremities.  No edema, pulses intact and symmetrical. Psychiatry.  Judgment and insight appears normal.    Data Reviewed: Prior data reviewed  Family Communication: Discussed with patient  Disposition: Status is: Observation The patient remains OBS appropriate and will d/c before 2 midnights.  Planned Discharge Destination: Skilled nursing facility  Time spent: 40 minutes  This record  has been created using Conservation officer, historic buildings. Errors have been sought and corrected,but may not always be located. Such creation errors do not reflect on the standard of care.   Author: Amaryllis Dare, MD 10/16/2023 2:37 PM  For on call review [email or web address].

## 2023-10-16 NOTE — Plan of Care (Signed)
  Problem: Education: Goal: Knowledge of General Education information will improve Description: Including pain rating scale, medication(s)/side effects and non-pharmacologic comfort measures Outcome: Progressing   Problem: Health Behavior/Discharge Planning: Goal: Ability to manage health-related needs will improve Outcome: Progressing   Problem: Activity: Goal: Risk for activity intolerance will decrease Outcome: Progressing   Problem: Nutrition: Goal: Adequate nutrition will be maintained Outcome: Progressing   Problem: Elimination: Goal: Will not experience complications related to bowel motility Outcome: Progressing Goal: Will not experience complications related to urinary retention Outcome: Progressing   Problem: Safety: Goal: Ability to remain free from injury will improve Outcome: Progressing   Problem: Skin Integrity: Goal: Risk for impaired skin integrity will decrease Outcome: Progressing   

## 2023-10-16 NOTE — Progress Notes (Signed)
 Physical Therapy Treatment Patient Details Name: Lynn Norton MRN: [id number] DOB: 10-27-1950 Today's Date: 10/16/2023   History of Present Illness 73 y.o. female with medical history significant of  hypertension, GERD, rheumatoid arthritis, tobacco abuse, alcohol abuse in remission, PAD, CKD-4, UTI, E. coli bacteremia, falls, who presents with fall.    PT Comments  Pt in bed.  Asking for help to go to the bathroom and for a new gown (hers is visibly soiled from lunch).  She is assisted with min a for bed mobility and gait.  14' to bathroom then 25' to door before returning to bed.  Encouraged pt to walk further but she declined and initiated return to bed.  Encouraged OOB to chair but again elects to return to supine.  Overall gait is somewhat improved but still requires +1 hands on for safety at all times.     If plan is discharge home, recommend the following: A little help with walking and/or transfers;A little help with bathing/dressing/bathroom;Assistance with cooking/housework;Direct supervision/assist for medications management;Assist for transportation;Help with stairs or ramp for entrance   Can travel by private vehicle        Equipment Recommendations       Recommendations for Other Services       Precautions / Restrictions Precautions Precautions: Fall Recall of Precautions/Restrictions: Impaired Restrictions Weight Bearing Restrictions Per Provider Order: No     Mobility  Bed Mobility Overal bed mobility: Needs Assistance Bed Mobility: Supine to Sit, Sit to Supine     Supine to sit: Min assist Sit to supine: Min assist     Patient Response: Cooperative  Transfers Overall transfer level: Needs assistance Equipment used: Rolling walker (2 wheels) Transfers: Sit to/from Stand Sit to Stand: Min assist                Ambulation/Gait Ambulation/Gait assistance: Editor, commissioning (Feet): 25 Feet Assistive device: Rolling walker (2 wheels) Gait  Pattern/deviations: Step-through pattern, Decreased step length - right, Decreased step length - left, Shuffle Gait velocity: dec         Stairs             Wheelchair Mobility     Tilt Bed Tilt Bed Patient Response: Cooperative  Modified Rankin (Stroke Patients Only)       Balance Overall balance assessment: Needs assistance Sitting-balance support: Feet supported, Single extremity supported Sitting balance-Leahy Scale: Fair     Standing balance support: Bilateral upper extremity supported, Reliant on assistive device for balance, Single extremity supported, During functional activity Standing balance-Leahy Scale: Poor Standing balance comment: BUE support while walking with RW and +1 hands on assist at all times                            Communication Communication Communication: No apparent difficulties  Cognition Arousal: Alert Behavior During Therapy: WFL for tasks assessed/performed   PT - Cognitive impairments: Safety/Judgement, Problem solving, Sequencing                       PT - Cognition Comments: seems more engaged and orientated this session Following commands: Impaired Following commands impaired: Follows one step commands with increased time, Follows multi-step commands inconsistently    Cueing Cueing Techniques: Verbal cues, Gestural cues, Tactile cues, Visual cues  Exercises      General Comments        Pertinent Vitals/Pain Pain Assessment Pain Assessment: No/denies pain  Home Living                          Prior Function            PT Goals (current goals can now be found in the care plan section) Progress towards PT goals: Progressing toward goals    Frequency    Min 2X/week      PT Plan      Co-evaluation              AM-PAC PT 6 Clicks Mobility   Outcome Measure  Help needed turning from your back to your side while in a flat bed without using bedrails?: A  Little Help needed moving from lying on your back to sitting on the side of a flat bed without using bedrails?: A Little Help needed moving to and from a bed to a chair (including a wheelchair)?: A Little Help needed standing up from a chair using your arms (e.g., wheelchair or bedside chair)?: A Little Help needed to walk in hospital room?: A Little Help needed climbing 3-5 steps with a railing? : A Lot 6 Click Score: 17    End of Session Equipment Utilized During Treatment: Gait belt Activity Tolerance: Patient tolerated treatment well Patient left: in bed;with bed alarm set;with call bell/phone within reach Nurse Communication: Mobility status PT Visit Diagnosis: Unsteadiness on feet (R26.81);Repeated falls (R29.6);Muscle weakness (generalized) (M62.81);Difficulty in walking, not elsewhere classified (R26.2);Pain Pain - Right/Left: Left Pain - part of body: Arm     Time: 1445-1453 PT Time Calculation (min) (ACUTE ONLY): 8 min  Charges:    $Gait Training: 8-22 mins PT General Charges $$ ACUTE PT VISIT: 1 Visit                   Lauraine Gills, PTA 10/16/23, 3:17 PM

## 2023-10-16 NOTE — Plan of Care (Signed)

## 2023-10-17 ENCOUNTER — Ambulatory Visit: Admission: RE | Admit: 2023-10-17 | Source: Ambulatory Visit

## 2023-10-17 DIAGNOSIS — W19XXXA Unspecified fall, initial encounter: Secondary | ICD-10-CM | POA: Diagnosis not present

## 2023-10-17 DIAGNOSIS — S2249XA Multiple fractures of ribs, unspecified side, initial encounter for closed fracture: Secondary | ICD-10-CM | POA: Diagnosis not present

## 2023-10-17 DIAGNOSIS — R42 Dizziness and giddiness: Secondary | ICD-10-CM | POA: Diagnosis not present

## 2023-10-17 DIAGNOSIS — M069 Rheumatoid arthritis, unspecified: Secondary | ICD-10-CM | POA: Diagnosis not present

## 2023-10-17 MED ORDER — NICOTINE POLACRILEX 2 MG MT GUM: 2.0000 mg | CHEWING_GUM | OROMUCOSAL | Status: DC | PRN

## 2023-10-17 MED ORDER — OXYCODONE-ACETAMINOPHEN 5-325 MG PO TABS: 1.0000 | ORAL_TABLET | Freq: Four times a day (QID) | ORAL | 0 refills | Status: DC | PRN

## 2023-10-17 MED ORDER — IBUPROFEN 600 MG PO TABS: 600.0000 mg | ORAL_TABLET | Freq: Three times a day (TID) | ORAL | Status: AC | PRN

## 2023-10-17 MED ORDER — ENSURE PLUS HIGH PROTEIN PO LIQD: 237.0000 mL | Freq: Two times a day (BID) | ORAL | Status: AC

## 2023-10-17 MED ORDER — SODIUM CHLORIDE 1 G PO TABS: 1.0000 g | ORAL_TABLET | Freq: Two times a day (BID) | ORAL | Status: AC

## 2023-10-17 MED ORDER — DM-GUAIFENESIN ER 30-600 MG PO TB12: 1.0000 | ORAL_TABLET | Freq: Two times a day (BID) | ORAL | Status: AC | PRN

## 2023-10-17 MED ORDER — METHOCARBAMOL 500 MG PO TABS: 500.0000 mg | ORAL_TABLET | Freq: Three times a day (TID) | ORAL | Status: AC | PRN

## 2023-10-17 MED ORDER — LIDOCAINE 5 % EX PTCH: 1.0000 | MEDICATED_PATCH | Freq: Every day | CUTANEOUS | Status: DC

## 2023-10-17 NOTE — Plan of Care (Signed)
   Problem: Education: Goal: Knowledge of General Education information will improve Description: Including pain rating scale, medication(s)/side effects and non-pharmacologic comfort measures Outcome: Progressing   Problem: Clinical Measurements: Goal: Will remain free from infection Outcome: Progressing Goal: Diagnostic test results will improve Outcome: Progressing

## 2023-10-17 NOTE — Progress Notes (Signed)
 Physical Therapy Treatment Patient Details Name: Lynn Norton MRN: [id number] DOB: Sep 09, 1950 Today's Date: 10/17/2023   History of Present Illness 73 y.o. female with medical history significant of  hypertension, GERD, rheumatoid arthritis, tobacco abuse, alcohol abuse in remission, PAD, CKD-4, UTI, E. coli bacteremia, falls, who presents with fall.    PT Comments  Pt received upright in bed agreeable to PT services. Able to exit bed at supervision. HHA+1 for STS and ambulating ~40' to bathroom. Pt very unsteady in sagittal plane needing regular minA and HHA for steadying. Able to urinate independently, CGA for for pericare. Additional minA via HHA for STS then provided RW. Able to ambulate to doorway of room to bedside. Needs 2-3 min seated rest to recover due to LE fatigue then ambulates around bed to recliner. With RW pt mostly CGA, needs PRN minA on RW for L veering with poor carryover. Pt in recliner with all needs in reach. Pt remains a very high falls risk but does have improved endurance for further gait distances this date. D/c recs remain appropriate.    If plan is discharge home, recommend the following: A little help with walking and/or transfers;A little help with bathing/dressing/bathroom;Assistance with cooking/housework;Direct supervision/assist for medications management;Assist for transportation;Help with stairs or ramp for entrance   Can travel by private vehicle     Yes  Equipment Recommendations  Other (comment) (TBD by next venue of care)    Recommendations for Other Services       Precautions / Restrictions Precautions Precautions: Fall Recall of Precautions/Restrictions: Impaired Restrictions Weight Bearing Restrictions Per Provider Order: No     Mobility  Bed Mobility Overal bed mobility: Needs Assistance Bed Mobility: Supine to Sit     Supine to sit: Supervision, HOB elevated, Used rails       Patient Response: Cooperative, Flat  affect  Transfers Overall transfer level: Needs assistance Equipment used: 1 person hand held assist Transfers: Sit to/from Stand Sit to Stand: Min assist                Ambulation/Gait Ambulation/Gait assistance: Min assist Gait Distance (Feet): 80 Feet (40' HHA with seated bathroom break. additional 58' RW.) Assistive device: Rolling walker (2 wheels), 1 person hand held assist Gait Pattern/deviations: Step-through pattern, Decreased step length - right, Decreased step length - left, Shuffle       General Gait Details: improved gait with RW compared to HHA. Notable veering and A/P instability ambulating. Does have L veering with RW but able to at least ambulate step through pattern with minA on RW and CGA.   Stairs             Wheelchair Mobility     Tilt Bed Tilt Bed Patient Response: Cooperative, Flat affect  Modified Rankin (Stroke Patients Only)       Balance Overall balance assessment: Needs assistance Sitting-balance support: Feet supported, Single extremity supported Sitting balance-Leahy Scale: Fair     Standing balance support: Bilateral upper extremity supported, Reliant on assistive device for balance, Single extremity supported, During functional activity Standing balance-Leahy Scale: Poor Standing balance comment: BUE support while walking with RW and +1 hands on assist at all times                            Communication Communication Communication: No apparent difficulties  Cognition Arousal: Alert Behavior During Therapy: WFL for tasks assessed/performed   PT - Cognitive impairments: Safety/Judgement, Problem solving, Sequencing  Following commands: Impaired Following commands impaired: Follows one step commands with increased time, Follows multi-step commands inconsistently    Cueing Cueing Techniques: Verbal cues, Gestural cues, Tactile cues, Visual cues  Exercises      General  Comments        Pertinent Vitals/Pain Pain Assessment Pain Assessment: No/denies pain    Home Living                          Prior Function            PT Goals (current goals can now be found in the care plan section) Acute Rehab PT Goals Patient Stated Goal: none stated Progress towards PT goals: Progressing toward goals    Frequency    Min 2X/week      PT Plan      Co-evaluation              AM-PAC PT 6 Clicks Mobility   Outcome Measure  Help needed turning from your back to your side while in a flat bed without using bedrails?: A Little Help needed moving from lying on your back to sitting on the side of a flat bed without using bedrails?: A Little Help needed moving to and from a bed to a chair (including a wheelchair)?: A Little Help needed standing up from a chair using your arms (e.g., wheelchair or bedside chair)?: A Little Help needed to walk in hospital room?: A Lot Help needed climbing 3-5 steps with a railing? : A Lot 6 Click Score: 16    End of Session Equipment Utilized During Treatment: Gait belt Activity Tolerance: Patient tolerated treatment well Patient left: in chair;with call bell/phone within reach;with chair alarm set Nurse Communication: Mobility status PT Visit Diagnosis: Unsteadiness on feet (R26.81);Repeated falls (R29.6);Muscle weakness (generalized) (M62.81);Difficulty in walking, not elsewhere classified (R26.2);Pain     Time: 8685-8668 PT Time Calculation (min) (ACUTE ONLY): 17 min  Charges:    $Gait Training: 8-22 mins PT General Charges $$ ACUTE PT VISIT: 1 Visit                     Dorina HERO. Fairly IV, PT, DPT Physical Therapist- [place]  [facility] 10/17/2023, 3:19 PM

## 2023-10-17 NOTE — Plan of Care (Signed)
  Problem: Education: Goal: Knowledge of General Education information will improve Description: Including pain rating scale, medication(s)/side effects and non-pharmacologic comfort measures 10/17/2023 1444 by Joshua Andrez PARAS, LPN Outcome: Adequate for Discharge 10/17/2023 1444 by Joshua Andrez PARAS, LPN Outcome: Adequate for Discharge 10/17/2023 0721 by Joshua Andrez PARAS, LPN Outcome: Progressing   Problem: Health Behavior/Discharge Planning: Goal: Ability to manage health-related needs will improve 10/17/2023 1444 by Joshua Andrez PARAS, LPN Outcome: Adequate for Discharge 10/17/2023 1444 by Joshua Andrez PARAS, LPN Outcome: Adequate for Discharge   Problem: Clinical Measurements: Goal: Ability to maintain clinical measurements within normal limits will improve 10/17/2023 1444 by Joshua Andrez PARAS, LPN Outcome: Adequate for Discharge 10/17/2023 1444 by Joshua Andrez PARAS, LPN Outcome: Adequate for Discharge Goal: Will remain free from infection 10/17/2023 1444 by Joshua Andrez PARAS, LPN Outcome: Adequate for Discharge 10/17/2023 1444 by Joshua Andrez PARAS, LPN Outcome: Adequate for Discharge 10/17/2023 0721 by Joshua Andrez PARAS, LPN Outcome: Progressing Goal: Diagnostic test results will improve 10/17/2023 1444 by Joshua Andrez PARAS, LPN Outcome: Adequate for Discharge 10/17/2023 1444 by Joshua Andrez PARAS, LPN Outcome: Adequate for Discharge 10/17/2023 0721 by Joshua Andrez PARAS, LPN Outcome: Progressing Goal: Respiratory complications will improve 10/17/2023 1444 by Joshua Andrez PARAS, LPN Outcome: Adequate for Discharge 10/17/2023 1444 by Joshua Andrez PARAS, LPN Outcome: Adequate for Discharge Goal: Cardiovascular complication will be avoided 10/17/2023 1444 by Joshua Andrez PARAS, LPN Outcome: Adequate for Discharge 10/17/2023 1444 by Joshua Andrez PARAS, LPN Outcome: Adequate for Discharge   Problem: Activity: Goal: Risk for activity intolerance will decrease 10/17/2023 1444 by Joshua Andrez PARAS, LPN Outcome: Adequate for Discharge 10/17/2023  1444 by Joshua Andrez PARAS, LPN Outcome: Adequate for Discharge   Problem: Nutrition: Goal: Adequate nutrition will be maintained 10/17/2023 1444 by Joshua Andrez PARAS, LPN Outcome: Adequate for Discharge 10/17/2023 1444 by Joshua Andrez PARAS, LPN Outcome: Adequate for Discharge   Problem: Coping: Goal: Level of anxiety will decrease 10/17/2023 1444 by Joshua Andrez PARAS, LPN Outcome: Adequate for Discharge 10/17/2023 1444 by Joshua Andrez PARAS, LPN Outcome: Adequate for Discharge   Problem: Elimination: Goal: Will not experience complications related to bowel motility 10/17/2023 1444 by Joshua Andrez PARAS, LPN Outcome: Adequate for Discharge 10/17/2023 1444 by Joshua Andrez PARAS, LPN Outcome: Adequate for Discharge Goal: Will not experience complications related to urinary retention 10/17/2023 1444 by Joshua Andrez PARAS, LPN Outcome: Adequate for Discharge 10/17/2023 1444 by Joshua Andrez PARAS, LPN Outcome: Adequate for Discharge   Problem: Pain Managment: Goal: General experience of comfort will improve and/or be controlled 10/17/2023 1444 by Joshua Andrez PARAS, LPN Outcome: Adequate for Discharge 10/17/2023 1444 by Joshua Andrez PARAS, LPN Outcome: Adequate for Discharge   Problem: Safety: Goal: Ability to remain free from injury will improve 10/17/2023 1444 by Joshua Andrez PARAS, LPN Outcome: Adequate for Discharge 10/17/2023 1444 by Joshua Andrez PARAS, LPN Outcome: Adequate for Discharge   Problem: Skin Integrity: Goal: Risk for impaired skin integrity will decrease 10/17/2023 1444 by Joshua Andrez PARAS, LPN Outcome: Adequate for Discharge 10/17/2023 1444 by Joshua Andrez PARAS, LPN Outcome: Adequate for Discharge

## 2023-10-17 NOTE — Discharge Summary (Signed)
 Physician Discharge Summary   Patient: Lynn Norton MRN: [id number] DOB: 1950/12/06  Admit date:     10/10/2023  Discharge date: 10/17/23  Discharge Physician: Amaryllis Dare   PCP: [facility]   Recommendations at discharge:  Please obtain CBC and BMP on follow-up Follow-up with primary care provider Follow-up with rheumatology for her rheumatoid arthritis  Discharge Diagnoses: Principal Problem:   Closed rib fracture Active Problems:   Fall at home, initial encounter   Rheumatoid arthritis (HCC)   Hyponatremia   Tobacco use   HTN (hypertension)   CKD (chronic kidney disease) stage 4, GFR 15-29 ml/min (HCC)   Protein-calorie malnutrition, severe (HCC)   Lightheadedness   Gait instability   Hospital Course: Taken from prior notes.  Lynn Norton is a 73 y.o. female with medical history significant of  hypertension, GERD, rheumatoid arthritis, tobacco abuse, alcohol abuse in remission, PAD, CKD-4, UTI, E. coli bacteremia, who presents with fall.   Family reports multiple falls over the past 5 to 6 weeks.  Apparently she was not using her walker.  On presentation stable vital, labs with mild leukocytosis at 10.9, sodium 130.  Chest x-ray showed rib fracture. CT of head is negative for acute intracranial abnormalities. CT of C-spine negative for acute injury. CT of maxillofacial image negative for acute injury. X-ray of right hand negative for acute bony fracture. MRI of the brain negative for acute stroke.   8/20: Vital stable.  PT is recommending SNF.  Elevated vitamin D  level at 124, UA was not consistent with UTI and sodium improved to 134.  8/21: Remained hemodynamically stable, awaiting placement.  8/22: Remained hemodynamically stable, pending insurance authorization for rehab.  8/23:Remained stable, pending insurance for SNF.  8/24: Patient remained hemodynamically stable and is being discharged to SNF for rehab.  Patient is given some pain medication  to use only if simple Tylenol  does not help.  Please avoid constipation while using opioids.  She will continue on current medications and need to have a close follow-up with her providers for further assistance.  Assessment and Plan: * Closed rib fracture Likely secondary to falls.  No significant pain. x-ray showed acute mildly displaced fractures of the lateral right tenth rib and anterior left sixth rib and lateral left tenth rib.  -Continue with supportive care -Incentive spirometry  Fall at home, initial encounter PT is recommending SNF TOC was consulted-awaiting placement  Rheumatoid arthritis [employer]) Patient states she is not taking prednisone  currently. -pt is on methotrexate  weekly - Continue folic acid   Tobacco use - Nicotine  patch as needed  Hyponatremia Seems chronic.  Improved to 134 s/p normal saline -Continue with salt tablets  HTN (hypertension) Blood pressure currently within goal. - Continue with amlodipine  -As needed hydralazine   CKD (chronic kidney disease) stage 4, GFR 15-29 ml/min (HCC) CKD stage IV was listed in her chart but renal function has been improved with 3 GFR above 60 today. - Continue to monitor -Avoid nephrotoxin  Protein-calorie malnutrition, severe (HCC) Estimated body mass index is 15.89 kg/m as calculated from the following:   Height as of 10/22/22: 5' 2 (1.575 m).   Weight as of this encounter: 39.4 kg.   - Dietitian consult      Pain control - [place]  Controlled Substance Reporting System database was reviewed. and patient was instructed, not to drive, operate heavy machinery, perform activities at heights, swimming or participation in water activities or provide baby-sitting services while on Pain, Sleep and Anxiety Medications; until their outpatient Physician  has advised to do so again. Also recommended to not to take more than prescribed Pain, Sleep and Anxiety Medications.  Consultants: None Procedures performed:  None Disposition: Skilled nursing facility Diet recommendation:  Discharge Diet Orders (From admission, onward)     Start     Ordered   10/17/23 0000  Diet - low sodium heart healthy        10/17/23 1427           Regular diet DISCHARGE MEDICATION: Allergies as of 10/17/2023       Reactions   Sulfa Antibiotics Other (See Comments), Itching   Ceftriaxone Rash   Penicillins Rash, Other (See Comments)   Rocephin [ceftriaxone] Hives   Spoke with patient. States she had hives that spread in response to rocephin   Sulfa Antibiotics    Penicillins Itching        Medication List     STOP taking these medications    doxycycline 100 MG tablet Commonly known as: VIBRA-TABS   HYDROcodone -acetaminophen  5-325 MG tablet Commonly known as: NORCO/VICODIN   meloxicam 15 MG tablet Commonly known as: MOBIC       TAKE these medications    acetaminophen  325 MG tablet Commonly known as: TYLENOL  Take 650 mg by mouth every 6 (six) hours as needed for mild pain (pain score 1-3) or moderate pain (pain score 4-6).   albuterol  108 (90 Base) MCG/ACT inhaler Commonly known as: VENTOLIN  HFA Inhale 1-2 puffs into the lungs 4 (four) times daily as needed for wheezing or shortness of breath.   amLODipine  10 MG tablet Commonly known as: NORVASC  Take 1 tablet (10 mg total) by mouth daily. What changed: how much to take   dextromethorphan-guaiFENesin  30-600 MG 12hr tablet Commonly known as: MUCINEX  DM Take 1 tablet by mouth 2 (two) times daily as needed for cough.   docusate sodium  100 MG capsule Commonly known as: COLACE Take 1 capsule (100 mg total) by mouth 2 (two) times daily.   feeding supplement Liqd Take 237 mLs by mouth 2 (two) times daily between meals.   folic acid  1 MG tablet Commonly known as: FOLVITE  Take 1 mg by mouth daily.   ibuprofen  600 MG tablet Commonly known as: ADVIL  Take 1 tablet (600 mg total) by mouth every 8 (eight) hours as needed for mild pain (pain  score 1-3) or fever (temp > 38.3 Celsius).   lidocaine  5 % Commonly known as: LIDODERM  Place 1 patch onto the skin at bedtime. Remove & Discard patch within 12 hours or as directed by MD   methocarbamol  500 MG tablet Commonly known as: ROBAXIN  Take 1 tablet (500 mg total) by mouth every 8 (eight) hours as needed for muscle spasms.   methotrexate  2.5 MG tablet Commonly known as: RHEUMATREX Take 20 mg by mouth every Thursday.   multivitamin with minerals Tabs tablet Take 1 tablet by mouth daily.   nicotine  21 mg/24hr patch Commonly known as: NICODERM CQ  - dosed in mg/24 hours Place 1 patch (21 mg total) onto the skin daily.   nicotine  polacrilex 2 MG gum Commonly known as: NICORETTE  Take 1 each (2 mg total) by mouth as needed for smoking cessation.   omeprazole  20 MG tablet Commonly known as: PRILOSEC  OTC Take 20 mg by mouth daily.   oxyCODONE -acetaminophen  5-325 MG tablet Commonly known as: PERCOCET/ROXICET Take 1 tablet by mouth every 6 (six) hours as needed for moderate pain (pain score 4-6).   PARoxetine  10 MG tablet Commonly known as: PAXIL  Take 10  mg by mouth daily.   predniSONE  5 MG tablet Commonly known as: DELTASONE  Take 5 mg by mouth daily.   sodium chloride  1 g tablet Take 1 tablet (1 g total) by mouth 2 (two) times daily with a meal.   thiamine  100 MG tablet Commonly known as: Vitamin B-1 Take 1 tablet (100 mg total) by mouth daily.   Turmeric 500 MG Caps Take 500 mg by mouth daily.   Vitamin D  (Ergocalciferol ) 1.25 MG (50000 UNIT) Caps capsule Commonly known as: DRISDOL Take 50,000 Units by mouth once a week.        Contact information for follow-up providers     [facility]. Schedule an appointment as soon as possible for a visit in 1 week(s).   Contact information: [address] [place] [address] [phone]              Contact information for after-discharge care     Destination     [facility] [place],  [place]   Service: Skilled Nursing Contact information: [address] [place]  [address] [phone]                    Discharge Exam: Lynn Norton   10/11/23 1247  Weight: 39.4 kg   General.  Frail and severely malnourished elderly lady, in no acute distress. Pulmonary.  Lungs clear bilaterally, normal respiratory effort. CV.  Regular rate and rhythm, no JVD, rub or murmur. Abdomen.  Soft, nontender, nondistended, BS positive. CNS.  Alert and oriented .  No focal neurologic deficit. Extremities.  No edema, no cyanosis, pulses intact and symmetrical.  Condition at discharge: stable  The results of significant diagnostics from this hospitalization (including imaging, microbiology, ancillary and laboratory) are listed below for reference.   Imaging Studies: MR BRAIN WO CONTRAST Result Date: 10/10/2023 EXAM: MRI BRAIN WITHOUT CONTRAST 10/10/2023 10:22:50 PM TECHNIQUE: Multiplanar multisequence MRI of the head/brain was performed without the administration of intravenous contrast. COMPARISON: Same day CT head. CLINICAL HISTORY: Sudden onset vertigo, dizziness 1 week ago, frequent falls. Found by family in the floor today. Pt has had multiple falls per family like 5-6 a week. Pt has large bruise and hematoma to right side forehead. FINDINGS: BRAIN AND VENTRICLES: No acute infarct. No intracranial hemorrhage. No mass. No midline shift. No hydrocephalus. The sella is unremarkable. Normal flow voids. Extensive T2/FLAIR hyperintensity throughout the periventricular and subcortical white matter likely reflecting severe chronic microvascular ischemic changes. Similar signal abnormality in the pons. Generalized parenchymal volume loss. ORBITS: Bilateral lens replacement. SINUSES AND MASTOIDS: No acute abnormality. BONES AND SOFT TISSUES: Normal marrow signal. Right frontal scalp hematoma with surrounding soft tissue swelling. IMPRESSION: 1. No acute intracranial abnormality.  2. Extensive chronic microvascular ischemic changes. 3. Generalized parenchymal volume loss. 4. Right frontal scalp hematoma with surrounding soft tissue swelling. Electronically signed by: Donnice Mania MD 10/10/2023 10:32 PM EDT RP Workstation: [id number]   DG Hand 2 View Right Result Date: 10/10/2023 CLINICAL DATA:  possible fracture. EXAM: RIGHT HAND - 2 VIEW COMPARISON:  October 26, 2022 FINDINGS: Diffuse osteopenia.No acute fracture or dislocation. Severe joint space loss of the first carpometacarpal and triscaphe joints. Severe joint space loss also noted throughout the carpometacarpal joints, most pronounced in the first through fourth digits. Moderate to severe radiocarpal joint space loss. Moderate to severe joint space loss throughout the DIP joints of the second through fifth digits. Palmar subluxation throughout the MCP joints. Peripheral vascular atherosclerosis remote, healed fracture of the distal  ulnar shaft with screw and plate fixation. IMPRESSION: 1. Diffuse osteopenia. No acute, displaced fracture or dislocation. If concern persists for underlying fracture, a follow-up CT of the hand may be of benefit for further characterization. 2. Fairly severe palmar subluxation of the second through fourth MCP joints, worrisome for rheumatoid arthritis. Electronically Signed   By: Rogelia Myers M.D.   On: 10/10/2023 18:07   CT Head Wo Contrast Result Date: 10/10/2023 CLINICAL DATA:  Fall. Head and neck pain. Right scalp and face hematoma. EXAM: CT HEAD WITHOUT CONTRAST CT MAXILLOFACIAL WITHOUT CONTRAST CT CERVICAL SPINE WITHOUT CONTRAST TECHNIQUE: Multidetector CT imaging of the head, cervical spine, and maxillofacial structures were performed using the standard protocol without intravenous contrast. Multiplanar CT image reconstructions of the cervical spine and maxillofacial structures were also generated. RADIATION DOSE REDUCTION: This exam was performed according to the departmental  dose-optimization program which includes automated exposure control, adjustment of the mA and/or kV according to patient size and/or use of iterative reconstruction technique. COMPARISON:  10/22/2022 FINDINGS: CT HEAD FINDINGS Brain: No evidence of intracranial hemorrhage, acute infarction, hydrocephalus, extra-axial collection, or mass lesion/mass effect. Mild diffuse cerebral atrophy and severe chronic small vessel disease show no significant change. Vascular:  No hyperdense vessel or other acute findings. Skull: No evidence of fracture or other significant bone abnormality. Sinuses/Orbits:  No acute findings. Other: Small acute right frontal scalp hematoma. CT MAXILLOFACIAL FINDINGS Osseous: No acute fracture or other significant osseous abnormality. Orbits: No fracture identified. Unremarkable appearance of globes and intraorbital anatomy. Sinuses:  No air-fluid levels or other acute findings. Soft tissues:  No acute findings. CT CERVICAL SPINE FINDINGS Alignment: Normal. Skull base and vertebrae: No acute fracture. No primary bone lesion or focal pathologic process. Soft tissues and spinal canal: No prevertebral fluid or swelling. No visible canal hematoma. Disc levels: Severe degenerative disc disease is seen from levels of C4-T1, with mild degenerative disc disease at C2-3 and C3-4. Mild-to-moderate facet DJD is seen at several levels bilaterally. Upper chest: No acute findings. Other: None. IMPRESSION: Small acute right frontal scalp hematoma. No evidence of skull fracture or acute intracranial abnormality. Stable cerebral atrophy and chronic small vessel disease. No evidence of orbital or facial bone fracture. No evidence of cervical spine fracture or subluxation. Degenerative spondylosis, as described above. Electronically Signed   By: Norleen DELENA Kil M.D.   On: 10/10/2023 17:19   CT Cervical Spine Wo Contrast Result Date: 10/10/2023 CLINICAL DATA:  Fall. Head and neck pain. Right scalp and face hematoma.  EXAM: CT HEAD WITHOUT CONTRAST CT MAXILLOFACIAL WITHOUT CONTRAST CT CERVICAL SPINE WITHOUT CONTRAST TECHNIQUE: Multidetector CT imaging of the head, cervical spine, and maxillofacial structures were performed using the standard protocol without intravenous contrast. Multiplanar CT image reconstructions of the cervical spine and maxillofacial structures were also generated. RADIATION DOSE REDUCTION: This exam was performed according to the departmental dose-optimization program which includes automated exposure control, adjustment of the mA and/or kV according to patient size and/or use of iterative reconstruction technique. COMPARISON:  10/22/2022 FINDINGS: CT HEAD FINDINGS Brain: No evidence of intracranial hemorrhage, acute infarction, hydrocephalus, extra-axial collection, or mass lesion/mass effect. Mild diffuse cerebral atrophy and severe chronic small vessel disease show no significant change. Vascular:  No hyperdense vessel or other acute findings. Skull: No evidence of fracture or other significant bone abnormality. Sinuses/Orbits:  No acute findings. Other: Small acute right frontal scalp hematoma. CT MAXILLOFACIAL FINDINGS Osseous: No acute fracture or other significant osseous abnormality. Orbits: No fracture identified. Unremarkable appearance  of globes and intraorbital anatomy. Sinuses:  No air-fluid levels or other acute findings. Soft tissues:  No acute findings. CT CERVICAL SPINE FINDINGS Alignment: Normal. Skull base and vertebrae: No acute fracture. No primary bone lesion or focal pathologic process. Soft tissues and spinal canal: No prevertebral fluid or swelling. No visible canal hematoma. Disc levels: Severe degenerative disc disease is seen from levels of C4-T1, with mild degenerative disc disease at C2-3 and C3-4. Mild-to-moderate facet DJD is seen at several levels bilaterally. Upper chest: No acute findings. Other: None. IMPRESSION: Small acute right frontal scalp hematoma. No evidence of  skull fracture or acute intracranial abnormality. Stable cerebral atrophy and chronic small vessel disease. No evidence of orbital or facial bone fracture. No evidence of cervical spine fracture or subluxation. Degenerative spondylosis, as described above. Electronically Signed   By: Norleen DELENA Kil M.D.   On: 10/10/2023 17:19   CT Maxillofacial Wo Contrast Result Date: 10/10/2023 CLINICAL DATA:  Fall. Head and neck pain. Right scalp and face hematoma. EXAM: CT HEAD WITHOUT CONTRAST CT MAXILLOFACIAL WITHOUT CONTRAST CT CERVICAL SPINE WITHOUT CONTRAST TECHNIQUE: Multidetector CT imaging of the head, cervical spine, and maxillofacial structures were performed using the standard protocol without intravenous contrast. Multiplanar CT image reconstructions of the cervical spine and maxillofacial structures were also generated. RADIATION DOSE REDUCTION: This exam was performed according to the departmental dose-optimization program which includes automated exposure control, adjustment of the mA and/or kV according to patient size and/or use of iterative reconstruction technique. COMPARISON:  10/22/2022 FINDINGS: CT HEAD FINDINGS Brain: No evidence of intracranial hemorrhage, acute infarction, hydrocephalus, extra-axial collection, or mass lesion/mass effect. Mild diffuse cerebral atrophy and severe chronic small vessel disease show no significant change. Vascular:  No hyperdense vessel or other acute findings. Skull: No evidence of fracture or other significant bone abnormality. Sinuses/Orbits:  No acute findings. Other: Small acute right frontal scalp hematoma. CT MAXILLOFACIAL FINDINGS Osseous: No acute fracture or other significant osseous abnormality. Orbits: No fracture identified. Unremarkable appearance of globes and intraorbital anatomy. Sinuses:  No air-fluid levels or other acute findings. Soft tissues:  No acute findings. CT CERVICAL SPINE FINDINGS Alignment: Normal. Skull base and vertebrae: No acute fracture.  No primary bone lesion or focal pathologic process. Soft tissues and spinal canal: No prevertebral fluid or swelling. No visible canal hematoma. Disc levels: Severe degenerative disc disease is seen from levels of C4-T1, with mild degenerative disc disease at C2-3 and C3-4. Mild-to-moderate facet DJD is seen at several levels bilaterally. Upper chest: No acute findings. Other: None. IMPRESSION: Small acute right frontal scalp hematoma. No evidence of skull fracture or acute intracranial abnormality. Stable cerebral atrophy and chronic small vessel disease. No evidence of orbital or facial bone fracture. No evidence of cervical spine fracture or subluxation. Degenerative spondylosis, as described above. Electronically Signed   By: Norleen DELENA Kil M.D.   On: 10/10/2023 17:19   DG Chest 2 View Result Date: 10/10/2023 CLINICAL DATA:  Fall. EXAM: CHEST - 2 VIEW COMPARISON:  09/14/2006. FINDINGS: Bilateral lung fields are clear. Bilateral costophrenic angles are clear. Normal cardio-mediastinal silhouette. No acute osseous abnormalities. There is acute mildly displaced fracture of the lateral right tenth rib. There are also acute mildly displaced fractures of the anterior left sixth rib and lateral left tenth rib. Right shoulder arthroplasty noted. The soft tissues are within normal limits. IMPRESSION: *No active cardiopulmonary disease. Acute mildly displaced fractures of the lateral right tenth rib and anterior left sixth rib and lateral left tenth rib. Electronically  Signed   By: Ree Molt M.D.   On: 10/10/2023 13:53    Microbiology: Results for orders placed or performed during the hospital encounter of 06/13/21  Urine Culture     Status: Abnormal   Collection Time: 06/13/21 10:05 AM   Specimen: Urine, Random  Result Value Ref Range Status   Specimen Description   Final    URINE, RANDOM Performed at [facility], [address] [place], [place] [address]    Special Requests   Final     NONE Performed at [facility], [address] [place], [place] [address]    Culture >=100,000 COLONIES/mL ESCHERICHIA COLI (A)  Final   Report Status 06/16/2021 FINAL  Final   Organism ID, Bacteria ESCHERICHIA COLI (A)  Final      Susceptibility   Escherichia coli - MIC*    AMPICILLIN >=32 RESISTANT Resistant     CEFAZOLIN  <=4 SENSITIVE Sensitive     CEFEPIME <=0.12 SENSITIVE Sensitive     CEFTRIAXONE <=0.25 SENSITIVE Sensitive     CIPROFLOXACIN <=0.25 SENSITIVE Sensitive     GENTAMICIN <=1 SENSITIVE Sensitive     IMIPENEM <=0.25 SENSITIVE Sensitive     NITROFURANTOIN <=16 SENSITIVE Sensitive     TRIMETH/SULFA <=20 SENSITIVE Sensitive     AMPICILLIN/SULBACTAM 8 SENSITIVE Sensitive     PIP/TAZO <=4 SENSITIVE Sensitive     * >=100,000 COLONIES/mL ESCHERICHIA COLI  Blood culture (routine x 2)     Status: None   Collection Time: 06/13/21 10:35 AM   Specimen: BLOOD  Result Value Ref Range Status   Specimen Description BLOOD RIGHT ANTECUBITAL  Final   Special Requests   Final    BOTTLES DRAWN AEROBIC AND ANAEROBIC Blood Culture adequate volume   Culture   Final    NO GROWTH 5 DAYS Performed at [facility], [address] [place], [place] [address]    Report Status 06/18/2021 FINAL  Final  Blood culture (routine x 2)     Status: None   Collection Time: 06/13/21 10:35 AM   Specimen: BLOOD  Result Value Ref Range Status   Specimen Description BLOOD BLOOD RIGHT WRIST  Final   Special Requests   Final    BOTTLES DRAWN AEROBIC AND ANAEROBIC Blood Culture adequate volume   Culture   Final    NO GROWTH 5 DAYS Performed at [facility], [address] [place], [place] [address]    Report Status 06/18/2021 FINAL  Final    Labs: CBC: Recent Labs  Lab 10/11/23 0443 10/12/23 0431  WBC 8.1 9.5  HGB 11.9* 11.4*  HCT 34.4* 34.0*  MCV 93.7 95.5  PLT 339 366   Basic Metabolic Panel: Recent Labs  Lab 10/11/23 0443 10/12/23 0431  NA 130* 134*   K 3.6 3.9  CL 96* 98  CO2 26 23  GLUCOSE 75 99  BUN 12 38*  CREATININE 0.62 0.71  CALCIUM 8.6* 8.6*  MG  --  1.9  PHOS  --  4.2   Liver Function Tests: No results for input(s): AST, ALT, ALKPHOS, BILITOT, PROT, ALBUMIN in the last 168 hours. CBG: Recent Labs  Lab 10/15/23 0744  GLUCAP 106*    Discharge time spent: greater than 30 minutes.  This record has been created using Conservation officer, historic buildings. Errors have been sought and corrected,but may not always be located. Such creation errors do not reflect on the standard of care.   Signed: Amaryllis Dare, MD [facility] 10/17/2023

## 2023-10-17 NOTE — TOC Transition Note (Signed)
 Transition of Care [facility]) - Discharge Note   Patient Details  Name: Lynn Norton MRN: [id number] Date of Birth: 08/07/1950  [facility] of [facility] CM/SW Contact:  [place] L [place]a Ponder, LCSW Phone Number: 10/17/2023, 2:54 PM   Clinical Narrative:     CSW received auth for patient. Patient is going to [facility]. [facility] was scheduled. Patients HCPOA was contacted and notified.   TOC signing off  Final next level of care: Skilled Nursing Facility Barriers to Discharge: No Barriers Identified   Patient Goals and CMS Choice Patient states their goals for this hospitalization and ongoing recovery are:: To DC to rehab prior to going back home          Discharge Placement                       Discharge Plan and Services Additional resources added to the After Visit Summary for       Post Acute Care Choice: Skilled Nursing Facility                               Social Drivers of Health (SDOH) Interventions SDOH Screenings   Food Insecurity: No Food Insecurity (10/11/2023)  Housing: Low Risk  (10/11/2023)  Transportation Needs: No Transportation Needs (10/11/2023)  Utilities: Not At Risk (10/11/2023)  Financial Resource Strain: Low Risk  (06/29/2023)   Received from [facility]  Recent Concern: Financial Resource Strain - High Risk (05/25/2023)   Received from [facility]  Social Connections: Unknown (10/11/2023)  Tobacco Use: High Risk (10/10/2023)     Readmission Risk Interventions     No data to display

## 2023-10-17 NOTE — Plan of Care (Signed)

## 2023-11-10 ENCOUNTER — Emergency Department

## 2023-11-10 ENCOUNTER — Other Ambulatory Visit: Payer: Self-pay

## 2023-11-10 ENCOUNTER — Emergency Department
Admission: EM | Admit: 2023-11-10 | Discharge: 2023-11-10 | Disposition: A | Discharge status: Home / Self Care | Attending: Emergency Medicine | Admitting: Emergency Medicine

## 2023-11-10 DIAGNOSIS — I129 Hypertensive chronic kidney disease with stage 1 through stage 4 chronic kidney disease, or unspecified chronic kidney disease: Secondary | ICD-10-CM | POA: Diagnosis not present

## 2023-11-10 DIAGNOSIS — S2232XA Fracture of one rib, left side, initial encounter for closed fracture: Secondary | ICD-10-CM | POA: Insufficient documentation

## 2023-11-10 DIAGNOSIS — W07XXXA Fall from chair, initial encounter: Secondary | ICD-10-CM | POA: Insufficient documentation

## 2023-11-10 DIAGNOSIS — S299XXA Unspecified injury of thorax, initial encounter: Secondary | ICD-10-CM | POA: Diagnosis present

## 2023-11-10 DIAGNOSIS — N189 Chronic kidney disease, unspecified: Secondary | ICD-10-CM | POA: Insufficient documentation

## 2023-11-10 MED ORDER — OXYCODONE HCL 5 MG PO TABS
5.0000 mg | ORAL_TABLET | Freq: Once | ORAL | Status: AC
  Administered 2023-11-10: 5 mg via ORAL
  Filled 2023-11-10: qty 1

## 2023-11-10 MED ORDER — OXYCODONE HCL 5 MG PO TABS: 5.0000 mg | ORAL_TABLET | Freq: Three times a day (TID) | ORAL | 0 refills | Status: DC | PRN

## 2023-11-10 NOTE — ED Provider Notes (Signed)
 [facility] Provider Note    Event Date/Time   First MD Initiated Contact with Patient 11/10/23 1346     (approximate)   History   Fall   HPI  Lynn Norton is a 73 y.o. female history of RA, arthritis, hypertension, CKD, EtOH abuse presents emergency department with concerns of left rib pain.  Patient fell against a chair.  States having pain to the left side.  Denies abdominal pain.  No head injury or LOC.  Is not on blood thinners.  States she did not hit her head      Physical Exam   Triage Vital Signs: ED Triage Vitals  Encounter Vitals Group     BP 11/10/23 1258 109/68     Girls Systolic BP Percentile --      Girls Diastolic BP Percentile --      Boys Systolic BP Percentile --      Boys Diastolic BP Percentile --      Pulse Rate 11/10/23 1258 93     Resp 11/10/23 1258 18     Temp 11/10/23 1258 98.2 F (36.8 C)     Temp Source 11/10/23 1258 Oral     SpO2 11/10/23 1258 96 %     Weight 11/10/23 1257 108 lb (49 kg)     Height 11/10/23 1257 5' (1.524 m)     Head Circumference --      Peak Flow --      Pain Score 11/10/23 1256 3     Pain Loc --      Pain Education --      Exclude from Growth Chart --     Most recent vital signs: Vitals:   11/10/23 1258 11/10/23 1315  BP: 109/68   Pulse: 93   Resp: 18   Temp: 98.2 F (36.8 C)   SpO2: 96% 96%     General: Awake, no distress.   CV:  Good peripheral perfusion.  Resp:  Normal effort.  Left ribs tender, lungs clear Abd:  No distention.  Nontender Other:      ED Results / Procedures / Treatments   Labs (all labs ordered are listed, but only abnormal results are displayed) Labs Reviewed - No data to display   EKG     RADIOLOGY Chest x-ray CT chest    PROCEDURES:   Procedures  Critical Care:  no Chief Complaint  Patient presents with   Fall      MEDICATIONS ORDERED IN ED: Medications  oxyCODONE  (Oxy IR/ROXICODONE ) immediate release tablet 5 mg (5 mg  Oral Given 11/10/23 1434)     IMPRESSION / MDM / ASSESSMENT AND PLAN / ED COURSE  I reviewed the triage vital signs and the nursing notes.                              Differential diagnosis includes, but is not limited to, rib fracture, pneumothorax, contusion, fall  Patient's presentation is most consistent with acute illness / injury with system symptoms.  Medications given : Oxycodone  5 mg p.o.  Chest x-ray was independently reviewed interpreted by me as having the old fractures noted.  However due to the patient's new fall along with pain in the sensitivity of a plain x-ray we will go ahead and order CT chest   CT of the chest shows a fracture of the left 12th rib.  Remote healing fractures noted at other ribs.  This was  independently reviewed interpreted by me by reading radiologist report  I did explain these findings to the patient and her family member.  She was given oxycodone  while here in the ED for pain.  Will send a prescription for oxycodone  to her pharmacy.  No red flags to indicate any abdominal type workup for ruptured spleen etc.  She is nontender on the abdomen so I feel this would not be an issue at this time.  Do not feel the patient needs admission as she is a resident at a nursing facility that we will be able to help her.  She is in agreement treatment plan.  Was discharged stable condition.   FINAL CLINICAL IMPRESSION(S) / ED DIAGNOSES   Final diagnoses:  Closed fracture of one rib of left side, initial encounter     Rx / DC Orders   ED Discharge Orders          Ordered    oxyCODONE  (ROXICODONE ) 5 MG immediate release tablet  Every 8 hours PRN        11/10/23 1451             Note:  This document was prepared using Dragon voice recognition software and may include unintentional dictation errors.    Gasper Devere ORN, PA-C 11/10/23 1534    Floy Roberts, MD 11/10/23 [phone]

## 2023-11-10 NOTE — Discharge Instructions (Signed)
 Your 12th rib on the left side is broken.  You have other rib fractures that are healing. You also have pulmonary hypertension noted on your chest CT.  You need to follow-up with your regular doctor concerning this.  If you begin to have chest pain/shortness of breath or difficulty breathing please return emergency department.

## 2023-11-10 NOTE — ED Triage Notes (Signed)
 First Nurse Note:  Pt via [facility] from [employer]. States she had a mechanical fall trying to get in the chair. Chair hit the L side. Pt c/o L rib pain, abrasion to the L side. Denies any other pain. Denies LOC. Pt is A&Ox4 and NAD   EMS reports 127/56 BP, 90 HR, 97% on RA

## 2023-11-10 NOTE — ED Triage Notes (Signed)
 See first nurse note.

## 2023-11-11 ENCOUNTER — Other Ambulatory Visit: Payer: Self-pay

## 2023-11-11 ENCOUNTER — Emergency Department

## 2023-11-11 ENCOUNTER — Emergency Department
Admission: EM | Admit: 2023-11-11 | Discharge: 2023-11-12 | Disposition: A | Discharge status: Home / Self Care | Attending: Emergency Medicine | Admitting: Emergency Medicine

## 2023-11-11 DIAGNOSIS — N189 Chronic kidney disease, unspecified: Secondary | ICD-10-CM | POA: Diagnosis not present

## 2023-11-11 DIAGNOSIS — W19XXXA Unspecified fall, initial encounter: Secondary | ICD-10-CM | POA: Insufficient documentation

## 2023-11-11 DIAGNOSIS — S2242XA Multiple fractures of ribs, left side, initial encounter for closed fracture: Secondary | ICD-10-CM | POA: Insufficient documentation

## 2023-11-11 DIAGNOSIS — I129 Hypertensive chronic kidney disease with stage 1 through stage 4 chronic kidney disease, or unspecified chronic kidney disease: Secondary | ICD-10-CM | POA: Diagnosis not present

## 2023-11-11 DIAGNOSIS — S299XXA Unspecified injury of thorax, initial encounter: Secondary | ICD-10-CM | POA: Diagnosis present

## 2023-11-11 DIAGNOSIS — S0990XA Unspecified injury of head, initial encounter: Secondary | ICD-10-CM | POA: Diagnosis not present

## 2023-11-11 DIAGNOSIS — R300 Dysuria: Secondary | ICD-10-CM | POA: Diagnosis not present

## 2023-11-11 LAB — URINALYSIS, COMPLETE (UACMP) WITH MICROSCOPIC
Bilirubin Urine: NEGATIVE
Glucose, UA: NEGATIVE mg/dL
Hgb urine dipstick: NEGATIVE
Ketones, ur: NEGATIVE mg/dL
Nitrite: NEGATIVE
Protein, ur: NEGATIVE mg/dL
Specific Gravity, Urine: 1.01 (ref 1.005–1.030)
pH: 6 (ref 5.0–8.0)

## 2023-11-11 LAB — COMPREHENSIVE METABOLIC PANEL WITH GFR
ALT: 23 U/L (ref 0–44)
AST: 34 U/L (ref 15–41)
Albumin: 3.6 g/dL (ref 3.5–5.0)
Alkaline Phosphatase: 55 U/L (ref 38–126)
Anion gap: 11 (ref 5–15)
BUN: 13 mg/dL (ref 8–23)
CO2: 28 mmol/L (ref 22–32)
Calcium: 8.9 mg/dL (ref 8.9–10.3)
Chloride: 95 mmol/L — ABNORMAL LOW (ref 98–111)
Creatinine, Ser: 0.69 mg/dL (ref 0.44–1.00)
GFR, Estimated: >60 mL/min (ref >60)
Glucose, Bld: 89 mg/dL (ref 70–99)
Potassium: 4 mmol/L (ref 3.5–5.1)
Sodium: 134 mmol/L — ABNORMAL LOW (ref 135–145)
Total Bilirubin: 0.9 mg/dL (ref 0.0–1.2)
Total Protein: 6.4 g/dL — ABNORMAL LOW (ref 6.5–8.1)

## 2023-11-11 LAB — CBC WITH DIFFERENTIAL/PLATELET
Abs Immature Granulocytes: 0.04 K/uL (ref 0.00–0.07)
Basophils Absolute: 0 K/uL (ref 0.0–0.1)
Basophils Relative: 0 %
Eosinophils Absolute: 0 K/uL (ref 0.0–0.5)
Eosinophils Relative: 0 %
HCT: 34.4 % — ABNORMAL LOW (ref 36.0–46.0)
Hemoglobin: 11.4 g/dL — ABNORMAL LOW (ref 12.0–15.0)
Immature Granulocytes: 0 %
Lymphocytes Relative: 5 %
Lymphs Abs: 0.5 K/uL — ABNORMAL LOW (ref 0.7–4.0)
MCH: 32.7 pg (ref 26.0–34.0)
MCHC: 33.1 g/dL (ref 30.0–36.0)
MCV: 98.6 fL (ref 80.0–100.0)
Monocytes Absolute: 0.8 K/uL (ref 0.1–1.0)
Monocytes Relative: 7 %
Neutro Abs: 10 K/uL — ABNORMAL HIGH (ref 1.7–7.7)
Neutrophils Relative %: 88 %
Platelets: 261 K/uL (ref 150–400)
RBC: 3.49 MIL/uL — ABNORMAL LOW (ref 3.87–5.11)
RDW: 16.2 % — ABNORMAL HIGH (ref 11.5–15.5)
WBC: 11.4 K/uL — ABNORMAL HIGH (ref 4.0–10.5)
nRBC: 0 % (ref 0.0–0.2)

## 2023-11-11 LAB — RESP PANEL BY RT-PCR (RSV, FLU A&B, COVID)  RVPGX2
Influenza A by PCR: NEGATIVE
Influenza B by PCR: NEGATIVE
Resp Syncytial Virus by PCR: NEGATIVE
SARS Coronavirus 2 by RT PCR: NEGATIVE

## 2023-11-11 LAB — TROPONIN I (HIGH SENSITIVITY): Troponin I (High Sensitivity): 9 ng/L (ref <18)

## 2023-11-11 MED ORDER — FOSFOMYCIN TROMETHAMINE 3 G PO PACK
3.0000 g | PACK | Freq: Once | ORAL | Status: AC
  Administered 2023-11-11: 3 g via ORAL
  Filled 2023-11-11: qty 3

## 2023-11-11 NOTE — ED Triage Notes (Signed)
 Patient states she fell while trying to get into her bed and fell on left side; was seen here yesterday for a fall.

## 2023-11-11 NOTE — ED Provider Notes (Signed)
 [facility] Provider Note    Event Date/Time   First MD Initiated Contact with Patient 11/11/23 1850     (approximate)   History   Fall   HPI  Nafeesah Lapaglia is a 73 y.o. female with history of hypertension, CKD, gait instability, alcohol abuse in remission who comes in with concerns for a fall.  Patient reports having a fall where she just slid out of her bed.  She denies any injuries from it.  She does report recently being in the emergency room and diagnosed with a rib fracture but she has not picked up the pain medications.  She denies currently drinking denies any chest pain, abdominal pain or any other concerns    Physical Exam   Triage Vital Signs: ED Triage Vitals  Encounter Vitals Group     BP 11/11/23 1702 137/69     Girls Systolic BP Percentile --      Girls Diastolic BP Percentile --      Boys Systolic BP Percentile --      Boys Diastolic BP Percentile --      Pulse Rate 11/11/23 1702 91     Resp 11/11/23 1702 18     Temp 11/11/23 1702 98.6 F (37 C)     Temp Source 11/11/23 1702 Oral     SpO2 11/11/23 1702 98 %     Weight --      Height --      Head Circumference --      Peak Flow --      Pain Score 11/11/23 1701 0     Pain Loc --      Pain Education --      Exclude from Growth Chart --     Most recent vital signs: Vitals:   11/11/23 1702 11/11/23 1930  BP: 137/69 139/76  Pulse: 91 81  Resp: 18 18  Temp: 98.6 F (37 C)   SpO2: 98% 97%     General: Awake, no distress.  CV:  Good peripheral perfusion.  Resp:  Normal effort.  Abd:  No distention.  Other:  No chest wall tenderness no abdominal tenderness able to lift both legs up off the bed.  Moving arms without any issues.   ED Results / Procedures / Treatments   Labs (all labs ordered are listed, but only abnormal results are displayed) Labs Reviewed  CBC WITH DIFFERENTIAL/PLATELET - Abnormal; Notable for the following components:      Result Value   WBC 11.4  (*)    RBC 3.49 (*)    Hemoglobin 11.4 (*)    HCT 34.4 (*)    RDW 16.2 (*)    Neutro Abs 10.0 (*)    Lymphs Abs 0.5 (*)    All other components within normal limits  COMPREHENSIVE METABOLIC PANEL WITH GFR - Abnormal; Notable for the following components:   Sodium 134 (*)    Chloride 95 (*)    Total Protein 6.4 (*)    All other components within normal limits  RESP PANEL BY RT-PCR (RSV, FLU A&B, COVID)  RVPGX2  URINALYSIS, COMPLETE (UACMP) WITH MICROSCOPIC  TROPONIN I (HIGH SENSITIVITY)     EKG  My interpretation of EKG:  Sinus rhythm 72 without any ST elevation or T wave versions, right bundle branch block with left anterior fascicular block  RADIOLOGY I have reviewed the xray personally and interpreted and left lateral rib fractures   PROCEDURES:  Critical Care performed: No  Procedures  MEDICATIONS ORDERED IN ED: Medications - No data to display   IMPRESSION / MDM / ASSESSMENT AND PLAN / ED COURSE  I reviewed the triage vital signs and the nursing notes.   Patient's presentation is most consistent with acute presentation with potential threat to life or bodily function.   Patient comes in for another fall.  Workup was done to make sure that there was no pneumothorax given known rib fractures from yesterday and CT head and neck were repeated to make sure no intracranial hemorrhage, cervical fracture and will check basic labs, urine to look for causes for recurrent falls  CT head and neck are negative. X-ray of the ribs do show multiple rib fractures and she had a CT scan just done yesterday that only showed 1 acute 1.  Troponin is negative.  CBC shows stable hemoglobin.  CMP is reassuring.  UA is shows slight WBCs we will send for culture but really no known symptoms other than these recurrent falls.  We can send for culture and give a dose of fosfomycin  COVID, flu are negative.  Did discuss with the radiologist and these fractures that were seen on today's  x-ray were actually there yesterday on the CT scan but just a little bit harder to see.  I did repeat evaluated her and she really does not have any pain over this area I considered admission for patient initially when I was told patient was from home I did put her up for admission but I later did find out that patient was already from a facility therefore given her pain is well-controlled and she is up and ambulatory I do not feel like she will need to be admitted to the hospital for these known rib fractures that occurred yesterday.  Her repeat abdominal exam is soft and nontender and low suspicion for acute abdominal process.  We attempted to call the facility to try to get some or collateral information but were unable to reach them.  The patient is on the cardiac monitor to evaluate for evidence of arrhythmia and/or significant heart rate changes.      FINAL CLINICAL IMPRESSION(S) / ED DIAGNOSES   Final diagnoses:  Fall, initial encounter  Closed fracture of multiple ribs of left side, initial encounter     Rx / DC Orders   ED Discharge Orders     None        Note:  This document was prepared using Dragon voice recognition software and may include unintentional dictation errors.   Ernest Ronal BRAVO, MD 11/11/23 2241

## 2023-11-11 NOTE — ED Notes (Signed)
 Pt ask for something to eat advised NPO

## 2023-11-11 NOTE — ED Notes (Signed)
 Pt has been ambulatory going to the toilet with minimal assistance. Pt gets up and stabilizes herself, then ambulates to the toilet. This RN has given standby assist while pt ambulates.

## 2023-11-11 NOTE — Discharge Instructions (Addendum)
 Use incentive spirometer to help keep the lungs open.  There were no injuries from today's fall actually did discuss with the radiologist who thought that these rib fractures were actually from yesterday's fall and they were on the CT scan.  Given she is not hypoxic there is no indication for admission it is about keeping her pain well-controlled and keeping her lungs open with incentive spirometer.  We have given her a dose of antibiotics in case she did have a UTI her urine culture is pending.    BONES AND SOFT TISSUES: Nondisplaced left lateral 6th and 7th rib fractures. Nondisplaced left lateral 10th and 11th rib fractures.   IMPRESSION: 1. Nondisplaced left lateral rib fractures, as above. 2. No pneumothorax.

## 2023-11-11 NOTE — ED Notes (Signed)
 Pt requested that her POA, [employer], be updated about plan of care. This RN called [employer] and updated him about patient's plan to be discharged. POA has no other questions or concerns at this time.

## 2023-11-13 LAB — URINE CULTURE: Culture: <10000 — AB

## 2023-12-24 ENCOUNTER — Emergency Department
Admission: EM | Admit: 2023-12-24 | Discharge: 2023-12-24 | Disposition: A | Discharge status: Home / Self Care | Attending: Emergency Medicine | Admitting: Emergency Medicine

## 2023-12-24 ENCOUNTER — Encounter: Payer: Self-pay | Admitting: Emergency Medicine

## 2023-12-24 ENCOUNTER — Other Ambulatory Visit: Payer: Self-pay

## 2023-12-24 DIAGNOSIS — T148XXA Other injury of unspecified body region, initial encounter: Secondary | ICD-10-CM

## 2023-12-24 DIAGNOSIS — W010XXA Fall on same level from slipping, tripping and stumbling without subsequent striking against object, initial encounter: Secondary | ICD-10-CM | POA: Insufficient documentation

## 2023-12-24 DIAGNOSIS — S59912A Unspecified injury of left forearm, initial encounter: Secondary | ICD-10-CM | POA: Diagnosis present

## 2023-12-24 DIAGNOSIS — W19XXXA Unspecified fall, initial encounter: Secondary | ICD-10-CM

## 2023-12-24 DIAGNOSIS — S51812A Laceration without foreign body of left forearm, initial encounter: Secondary | ICD-10-CM | POA: Diagnosis not present

## 2023-12-24 NOTE — Discharge Instructions (Signed)

## 2023-12-24 NOTE — ED Provider Notes (Signed)
 [facility] Provider Note    Event Date/Time   First MD Initiated Contact with Patient 12/24/23 [phone]     (approximate)   History   Fall   HPI Lynn Norton is a 73 y.o. female who presents for evaluation from her nursing facility after a fall.  The patient states that she got up to go to the bathroom and the trip.  She admits to falls frequently.  She denies having any pain at this time.  She has a bandage on her left arm where she has a skin tear.  She denies headache, neck pain, chest pain, shortness of breath, nausea, vomiting, abdominal pain, and pain in her hips or legs.     Physical Exam   Triage Vital Signs: ED Triage Vitals [12/24/23 0555]  Encounter Vitals Group     BP (!) 142/87     Girls Systolic BP Percentile      Girls Diastolic BP Percentile      Boys Systolic BP Percentile      Boys Diastolic BP Percentile      Pulse Rate 91     Resp (!) 33     Temp      Temp src      SpO2 100 %     Weight 49 kg (108 lb 0.4 oz)     Height 1.524 m (5')     Head Circumference      Peak Flow      Pain Score 0     Pain Loc      Pain Education      Exclude from Growth Chart     Most recent vital signs: Vitals:   12/24/23 0555  BP: (!) 142/87  Pulse: 91  Resp: (!) 33  SpO2: 100%    General: Elderly but pleasant and conversant, awake and alert. HEENT: No evidence of acute trauma to her head or her face.  She has what appears to be a subacute contusion to the right side of her forehead but nothing new.  No tenderness to palpation of the cervical spine and no pain or tenderness with flexion, extension, and rotation of the head and neck from side-to-side. CV:  Good peripheral perfusion.  Resp:  Normal effort. Speaking easily and comfortably, no accessory muscle usage nor intercostal retractions.  Lungs are clear to auscultation bilaterally.  Patient has an occasional cough. Abd:  No distention.  No tenderness to palpation of the  abdomen. Other:  Patient has no tenderness to palpation and manipulation of her pelvis, nor of passive range of motion of her hips, knees, and ankles.  No palpable deformities to her joints.  She has a sizable but superficial skin tear to her left forearm, no contamination, and I covered it back up with petroleum infused bandage and gauze.   ED Results / Procedures / Treatments   Labs (all labs ordered are listed, but only abnormal results are displayed) Labs Reviewed - No data to display    PROCEDURES:  Critical Care performed: No  Procedures    IMPRESSION / MDM / ASSESSMENT AND PLAN / ED COURSE  I reviewed the triage vital signs and the nursing notes.                              Differential diagnosis includes, but is not limited to, mechanical fall, contusion, fracture, dislocation, skin tear.  Patient's presentation is most consistent with acute  presentation with potential threat to life or bodily function.   Interventions/Medications given:  Medications - No data to display  (Note:  hospital course my include additional interventions and/or labs/studies not listed above.)   Patient's exam is very reassuring.  She has a documented respiratory rate of 33 but that is not accurate.  Her lungs are clear and she reports no respiratory distress even though she has an occasional cough.  She describes well that she had a mechanical fall.  She has no evidence of traumatic injury except for the superficial skin tear to her left forearm that we will need to heal by secondary intention.  No indication for imaging at this time based on [place] head CT rules and Nexus criteria.  Patient is appropriate for discharge and outpatient follow-up.         FINAL CLINICAL IMPRESSION(S) / ED DIAGNOSES   Final diagnoses:  Fall, initial encounter  Skin avulsion     Rx / DC Orders   ED Discharge Orders     None        Note:  This document was prepared using Dragon voice  recognition software and may include unintentional dictation errors.   Gordan Huxley, MD 12/24/23 [phone]

## 2023-12-24 NOTE — ED Triage Notes (Signed)
 Pt to ED via [facility] from the [facility]. Pt denies hitting her head, skin tear to L forearm. Pt denies any pain on arrival. Pt on 2L [place] and is 99-100%. Pt arrives alert, confused.

## 2023-12-24 NOTE — Progress Notes (Signed)
 Given age and fall/weakness covid and flu swab ordered

## 2023-12-30 ENCOUNTER — Other Ambulatory Visit: Payer: Self-pay

## 2023-12-30 ENCOUNTER — Inpatient Hospital Stay
Admission: EM | Admit: 2023-12-30 | Discharge: 2024-01-04 | DRG: 183 | Disposition: A | Discharge status: Skilled Nursing Facility | Source: Skilled Nursing Facility | Attending: Internal Medicine | Admitting: Internal Medicine

## 2023-12-30 ENCOUNTER — Emergency Department

## 2023-12-30 ENCOUNTER — Encounter: Payer: Self-pay | Admitting: Hospitalist

## 2023-12-30 DIAGNOSIS — S2243XA Multiple fractures of ribs, bilateral, initial encounter for closed fracture: Secondary | ICD-10-CM | POA: Diagnosis not present

## 2023-12-30 DIAGNOSIS — R2689 Other abnormalities of gait and mobility: Secondary | ICD-10-CM | POA: Diagnosis present

## 2023-12-30 DIAGNOSIS — S2241XA Multiple fractures of ribs, right side, initial encounter for closed fracture: Principal | ICD-10-CM

## 2023-12-30 DIAGNOSIS — Z720 Tobacco use: Secondary | ICD-10-CM

## 2023-12-30 DIAGNOSIS — M069 Rheumatoid arthritis, unspecified: Secondary | ICD-10-CM | POA: Diagnosis present

## 2023-12-30 DIAGNOSIS — J439 Emphysema, unspecified: Secondary | ICD-10-CM | POA: Diagnosis present

## 2023-12-30 DIAGNOSIS — S2239XA Fracture of one rib, unspecified side, initial encounter for closed fracture: Secondary | ICD-10-CM | POA: Diagnosis present

## 2023-12-30 DIAGNOSIS — J9611 Chronic respiratory failure with hypoxia: Secondary | ICD-10-CM | POA: Insufficient documentation

## 2023-12-30 DIAGNOSIS — R296 Repeated falls: Secondary | ICD-10-CM | POA: Diagnosis present

## 2023-12-30 DIAGNOSIS — S2220XA Unspecified fracture of sternum, initial encounter for closed fracture: Secondary | ICD-10-CM | POA: Diagnosis present

## 2023-12-30 DIAGNOSIS — Z8249 Family history of ischemic heart disease and other diseases of the circulatory system: Secondary | ICD-10-CM

## 2023-12-30 DIAGNOSIS — K219 Gastro-esophageal reflux disease without esophagitis: Secondary | ICD-10-CM | POA: Diagnosis present

## 2023-12-30 DIAGNOSIS — Z79899 Other long term (current) drug therapy: Secondary | ICD-10-CM

## 2023-12-30 DIAGNOSIS — R079 Chest pain, unspecified: Secondary | ICD-10-CM

## 2023-12-30 DIAGNOSIS — Z880 Allergy status to penicillin: Secondary | ICD-10-CM

## 2023-12-30 DIAGNOSIS — Z888 Allergy status to other drugs, medicaments and biological substances status: Secondary | ICD-10-CM

## 2023-12-30 DIAGNOSIS — W19XXXA Unspecified fall, initial encounter: Secondary | ICD-10-CM | POA: Diagnosis present

## 2023-12-30 DIAGNOSIS — J441 Chronic obstructive pulmonary disease with (acute) exacerbation: Secondary | ICD-10-CM | POA: Diagnosis not present

## 2023-12-30 DIAGNOSIS — R2681 Unsteadiness on feet: Secondary | ICD-10-CM | POA: Diagnosis present

## 2023-12-30 DIAGNOSIS — I129 Hypertensive chronic kidney disease with stage 1 through stage 4 chronic kidney disease, or unspecified chronic kidney disease: Secondary | ICD-10-CM | POA: Diagnosis present

## 2023-12-30 DIAGNOSIS — J9601 Acute respiratory failure with hypoxia: Secondary | ICD-10-CM

## 2023-12-30 DIAGNOSIS — W19XXXD Unspecified fall, subsequent encounter: Secondary | ICD-10-CM

## 2023-12-30 DIAGNOSIS — J9621 Acute and chronic respiratory failure with hypoxia: Secondary | ICD-10-CM | POA: Diagnosis present

## 2023-12-30 DIAGNOSIS — Z882 Allergy status to sulfonamides status: Secondary | ICD-10-CM

## 2023-12-30 DIAGNOSIS — Z881 Allergy status to other antibiotic agents status: Secondary | ICD-10-CM

## 2023-12-30 DIAGNOSIS — N182 Chronic kidney disease, stage 2 (mild): Secondary | ICD-10-CM | POA: Diagnosis present

## 2023-12-30 DIAGNOSIS — Y92129 Unspecified place in nursing home as the place of occurrence of the external cause: Secondary | ICD-10-CM

## 2023-12-30 DIAGNOSIS — I745 Embolism and thrombosis of iliac artery: Secondary | ICD-10-CM | POA: Diagnosis present

## 2023-12-30 DIAGNOSIS — Z66 Do not resuscitate: Secondary | ICD-10-CM | POA: Diagnosis present

## 2023-12-30 DIAGNOSIS — R636 Underweight: Secondary | ICD-10-CM | POA: Diagnosis present

## 2023-12-30 DIAGNOSIS — Z1152 Encounter for screening for COVID-19: Secondary | ICD-10-CM

## 2023-12-30 DIAGNOSIS — Z681 Body mass index (BMI) 19 or less, adult: Secondary | ICD-10-CM

## 2023-12-30 DIAGNOSIS — Z9181 History of falling: Secondary | ICD-10-CM

## 2023-12-30 LAB — CBC WITH DIFFERENTIAL/PLATELET
Abs Immature Granulocytes: 0.03 K/uL (ref 0.00–0.07)
Basophils Absolute: 0 K/uL (ref 0.0–0.1)
Basophils Relative: 0 %
Eosinophils Absolute: 0.1 K/uL (ref 0.0–0.5)
Eosinophils Relative: 1 %
HCT: 38.3 % (ref 36.0–46.0)
Hemoglobin: 12.4 g/dL (ref 12.0–15.0)
Immature Granulocytes: 0 %
Lymphocytes Relative: 16 %
Lymphs Abs: 1.7 K/uL (ref 0.7–4.0)
MCH: 33.9 pg (ref 26.0–34.0)
MCHC: 32.4 g/dL (ref 30.0–36.0)
MCV: 104.6 fL — ABNORMAL HIGH (ref 80.0–100.0)
Monocytes Absolute: 1.3 K/uL — ABNORMAL HIGH (ref 0.1–1.0)
Monocytes Relative: 13 %
Neutro Abs: 7.3 K/uL (ref 1.7–7.7)
Neutrophils Relative %: 70 %
Platelets: 307 K/uL (ref 150–400)
RBC: 3.66 MIL/uL — ABNORMAL LOW (ref 3.87–5.11)
RDW: 16 % — ABNORMAL HIGH (ref 11.5–15.5)
WBC: 10.5 K/uL (ref 4.0–10.5)
nRBC: 0 % (ref 0.0–0.2)

## 2023-12-30 LAB — COMPREHENSIVE METABOLIC PANEL WITH GFR
ALT: 20 U/L (ref 0–44)
AST: 27 U/L (ref 15–41)
Albumin: 3.9 g/dL (ref 3.5–5.0)
Alkaline Phosphatase: 63 U/L (ref 38–126)
Anion gap: 12 (ref 5–15)
BUN: 23 mg/dL (ref 8–23)
CO2: 28 mmol/L (ref 22–32)
Calcium: 8.9 mg/dL (ref 8.9–10.3)
Chloride: 101 mmol/L (ref 98–111)
Creatinine, Ser: 0.91 mg/dL (ref 0.44–1.00)
GFR, Estimated: >60 mL/min (ref >60)
Glucose, Bld: 103 mg/dL — ABNORMAL HIGH (ref 70–99)
Potassium: 4.1 mmol/L (ref 3.5–5.1)
Sodium: 141 mmol/L (ref 135–145)
Total Bilirubin: 0.7 mg/dL (ref 0.0–1.2)
Total Protein: 7 g/dL (ref 6.5–8.1)

## 2023-12-30 LAB — RESP PANEL BY RT-PCR (RSV, FLU A&B, COVID)  RVPGX2
Influenza A by PCR: NEGATIVE
Influenza B by PCR: NEGATIVE
Resp Syncytial Virus by PCR: NEGATIVE
SARS Coronavirus 2 by RT PCR: NEGATIVE

## 2023-12-30 LAB — LIPASE, BLOOD: Lipase: 35 U/L (ref 11–51)

## 2023-12-30 LAB — MRSA NEXT GEN BY PCR, NASAL: MRSA by PCR Next Gen: NOT DETECTED

## 2023-12-30 LAB — TROPONIN I (HIGH SENSITIVITY): Troponin I (High Sensitivity): 13 ng/L (ref <18)

## 2023-12-30 LAB — BRAIN NATRIURETIC PEPTIDE: B Natriuretic Peptide: 148 pg/mL — ABNORMAL HIGH (ref 0.0–100.0)

## 2023-12-30 MED ORDER — METHYLPREDNISOLONE SODIUM SUCC 40 MG IJ SOLR
40.0000 mg | Freq: Two times a day (BID) | INTRAMUSCULAR | Status: DC
  Administered 2023-12-30 – 2024-01-01 (×4): 40 mg via INTRAVENOUS
  Filled 2023-12-30 (×5): qty 1

## 2023-12-30 MED ORDER — ACETAMINOPHEN 325 MG PO TABS: 650.0000 mg | ORAL_TABLET | Freq: Four times a day (QID) | ORAL | Status: DC | PRN

## 2023-12-30 MED ORDER — ONDANSETRON HCL 4 MG/2ML IJ SOLN
4.0000 mg | Freq: Once | INTRAMUSCULAR | Status: AC
  Administered 2023-12-30: 4 mg via INTRAVENOUS
  Filled 2023-12-30: qty 2

## 2023-12-30 MED ORDER — ACETAMINOPHEN 650 MG RE SUPP: 650.0000 mg | Freq: Four times a day (QID) | RECTAL | Status: DC | PRN

## 2023-12-30 MED ORDER — DOCUSATE SODIUM 100 MG PO CAPS
100.0000 mg | ORAL_CAPSULE | Freq: Two times a day (BID) | ORAL | Status: DC
  Administered 2023-12-30 – 2024-01-03 (×8): 100 mg via ORAL
  Filled 2023-12-30 (×10): qty 1

## 2023-12-30 MED ORDER — IOHEXOL 350 MG/ML SOLN
75.0000 mL | Freq: Once | INTRAVENOUS | Status: AC | PRN
  Administered 2023-12-30: 75 mL via INTRAVENOUS

## 2023-12-30 MED ORDER — ONDANSETRON HCL 4 MG/2ML IJ SOLN: 4.0000 mg | Freq: Four times a day (QID) | INTRAMUSCULAR | Status: DC | PRN

## 2023-12-30 MED ORDER — METHOTREXATE SODIUM 2.5 MG PO TABS: 20.0000 mg | ORAL_TABLET | ORAL | Status: DC

## 2023-12-30 MED ORDER — IPRATROPIUM-ALBUTEROL 0.5-2.5 (3) MG/3ML IN SOLN
3.0000 mL | Freq: Once | RESPIRATORY_TRACT | Status: AC
  Administered 2023-12-30: 3 mL via RESPIRATORY_TRACT
  Filled 2023-12-30: qty 3

## 2023-12-30 MED ORDER — ENSURE PLUS HIGH PROTEIN PO LIQD
237.0000 mL | Freq: Two times a day (BID) | ORAL | Status: DC
  Administered 2023-12-30 – 2024-01-03 (×8): 237 mL via ORAL

## 2023-12-30 MED ORDER — FENTANYL CITRATE (PF) 50 MCG/ML IJ SOSY
50.0000 ug | PREFILLED_SYRINGE | Freq: Once | INTRAMUSCULAR | Status: AC
  Administered 2023-12-30: 50 ug via INTRAVENOUS
  Filled 2023-12-30: qty 1

## 2023-12-30 MED ORDER — NICOTINE 21 MG/24HR TD PT24
21.0000 mg | MEDICATED_PATCH | Freq: Every day | TRANSDERMAL | Status: DC
  Administered 2023-12-30 – 2024-01-03 (×5): 21 mg via TRANSDERMAL
  Filled 2023-12-30 (×5): qty 1

## 2023-12-30 MED ORDER — IPRATROPIUM-ALBUTEROL 0.5-2.5 (3) MG/3ML IN SOLN: 3.0000 mL | RESPIRATORY_TRACT | Status: DC | PRN

## 2023-12-30 MED ORDER — SODIUM CHLORIDE 0.9 % IV SOLN
500.0000 mg | INTRAVENOUS | Status: AC
  Administered 2023-12-30: 500 mg via INTRAVENOUS
  Filled 2023-12-30: qty 5

## 2023-12-30 MED ORDER — PAROXETINE HCL 10 MG PO TABS
10.0000 mg | ORAL_TABLET | Freq: Every day | ORAL | Status: DC
  Administered 2023-12-30 – 2024-01-03 (×5): 10 mg via ORAL
  Filled 2023-12-30 (×5): qty 1

## 2023-12-30 MED ORDER — ENOXAPARIN SODIUM 40 MG/0.4ML IJ SOSY
40.0000 mg | PREFILLED_SYRINGE | INTRAMUSCULAR | Status: DC
  Administered 2023-12-30 – 2024-01-03 (×5): 40 mg via SUBCUTANEOUS
  Filled 2023-12-30 (×5): qty 0.4

## 2023-12-30 MED ORDER — METHOCARBAMOL 500 MG PO TABS
500.0000 mg | ORAL_TABLET | Freq: Three times a day (TID) | ORAL | Status: DC | PRN
Start: 2023-12-30 — End: 2024-01-04
  Administered 2023-12-31: 500 mg via ORAL
  Filled 2023-12-30: qty 1

## 2023-12-30 MED ORDER — MORPHINE SULFATE (PF) 4 MG/ML IV SOLN
4.0000 mg | Freq: Once | INTRAVENOUS | Status: AC
  Administered 2023-12-30: 4 mg via INTRAVENOUS
  Filled 2023-12-30: qty 1

## 2023-12-30 MED ORDER — MORPHINE SULFATE (PF) 2 MG/ML IV SOLN
2.0000 mg | INTRAVENOUS | Status: DC | PRN
  Administered 2023-12-30 – 2024-01-03 (×10): 2 mg via INTRAVENOUS
  Filled 2023-12-30 (×11): qty 1

## 2023-12-30 MED ORDER — FOLIC ACID 1 MG PO TABS
1.0000 mg | ORAL_TABLET | Freq: Every day | ORAL | Status: DC
  Administered 2023-12-30 – 2024-01-03 (×5): 1 mg via ORAL
  Filled 2023-12-30 (×5): qty 1

## 2023-12-30 MED ORDER — PANTOPRAZOLE SODIUM 40 MG IV SOLR
40.0000 mg | Freq: Two times a day (BID) | INTRAVENOUS | Status: DC
  Administered 2023-12-30 – 2024-01-03 (×9): 40 mg via INTRAVENOUS
  Filled 2023-12-30 (×10): qty 10

## 2023-12-30 MED ORDER — SODIUM CHLORIDE 1 G PO TABS
1.0000 g | ORAL_TABLET | Freq: Two times a day (BID) | ORAL | Status: DC
  Administered 2023-12-30 – 2024-01-03 (×9): 1 g via ORAL
  Filled 2023-12-30 (×9): qty 1

## 2023-12-30 MED ORDER — OXYCODONE HCL 5 MG PO TABS
5.0000 mg | ORAL_TABLET | ORAL | Status: DC | PRN
  Administered 2023-12-31 – 2024-01-03 (×2): 5 mg via ORAL
  Filled 2023-12-30 (×2): qty 1

## 2023-12-30 MED ORDER — AZITHROMYCIN 500 MG PO TABS
500.0000 mg | ORAL_TABLET | Freq: Every day | ORAL | Status: AC
  Administered 2023-12-31 – 2024-01-01 (×2): 500 mg via ORAL
  Filled 2023-12-30 (×2): qty 1

## 2023-12-30 MED ORDER — METHYLPREDNISOLONE SODIUM SUCC 125 MG IJ SOLR
125.0000 mg | Freq: Once | INTRAMUSCULAR | Status: AC
  Administered 2023-12-30: 125 mg via INTRAVENOUS
  Filled 2023-12-30: qty 2

## 2023-12-30 NOTE — ED Provider Notes (Signed)
 [facility] Provider Note    Event Date/Time   First MD Initiated Contact with Patient 12/30/23 [phone]     (approximate)   History   Rib Injury   HPI  Lynn Norton is a 73 y.o. female with history of hypertension, chronic kidney disease, rheumatoid arthritis, previous tobacco use who presents to the emergency department from her nursing facility complaining of right rib and upper abdominal pain.  Patient states she has had this pain since a previous fall and was diagnosed with a rib fracture.  She is unable to tell me when this occurred but it looks like the last time she was diagnosed with a rib fracture in our system was in September 2025.  She was seen December 24, 2023 after a fall but was not having any rib pain at that time.  She is tachypneic here with rhonchorous breath sounds.  Denies history of asthma, COPD, CHF.  Denies history of PE or DVT.  Does not wear oxygen chronically.  No fevers but does have a productive cough.  No vomiting or diarrhea.  No urinary symptoms.   History provided by patient and EMS.    Past Medical History:  Diagnosis Date   Arthritis    CKD (chronic kidney disease), stage IV (HCC)    HTN (hypertension)    RA (rheumatoid arthritis) (HCC)    Tobacco abuse     Past Surgical History:  Procedure Laterality Date   ORIF ELBOW FRACTURE Right 04/27/2021   Procedure: OPEN REDUCTION INTERNAL FIXATION (ORIF) ELBOW/OLECRANON FRACTURE;  Surgeon: Kathlynn Sharper, MD;  Location: [facility] ORS;  Service: Orthopedics;  Laterality: Right;   ORIF WRIST FRACTURE Right 10/27/2022   Procedure: OPEN REDUCTION INTERNAL FIXATION (ORIF) WRIST FRACTURE;  Surgeon: Edie Norleen PARAS, MD;  Location: [facility] ORS;  Service: Orthopedics;  Laterality: Right;    MEDICATIONS:  Prior to Admission medications   Medication Sig Start Date End Date Taking? Authorizing Provider  acetaminophen  (TYLENOL ) 325 MG tablet Take 650 mg by mouth every 6 (six) hours as needed for mild  pain (pain score 1-3) or moderate pain (pain score 4-6).    [provider]  albuterol  (VENTOLIN  HFA) 108 (90 Base) MCG/ACT inhaler Inhale 1-2 puffs into the lungs 4 (four) times daily as needed for wheezing or shortness of breath.    [provider]  amLODipine  (NORVASC ) 10 MG tablet Take 1 tablet (10 mg total) by mouth daily. Patient taking differently: Take 5 mg by mouth daily. 10/30/22   Patel, Sona, MD  dextromethorphan-guaiFENesin  (MUCINEX  DM) 30-600 MG 12hr tablet Take 1 tablet by mouth 2 (two) times daily as needed for cough. 10/17/23   Amin, Sumayya, MD  docusate sodium  (COLACE) 100 MG capsule Take 1 capsule (100 mg total) by mouth 2 (two) times daily. Patient not taking: Reported on 10/11/2023 10/29/22   Patel, Sona, MD  feeding supplement (ENSURE PLUS HIGH PROTEIN) LIQD Take 237 mLs by mouth 2 (two) times daily between meals. 10/17/23   Caleen Qualia, MD  folic acid  (FOLVITE ) 1 MG tablet Take 1 mg by mouth daily.    [provider]  ibuprofen  (ADVIL ) 600 MG tablet Take 1 tablet (600 mg total) by mouth every 8 (eight) hours as needed for mild pain (pain score 1-3) or fever (temp > 38.3 Celsius). 10/17/23   Caleen Qualia, MD  lidocaine  (LIDODERM ) 5 % Place 1 patch onto the skin at bedtime. Remove & Discard patch within 12 hours or as directed by MD 10/17/23  Caleen Qualia, MD  methocarbamol  (ROBAXIN ) 500 MG tablet Take 1 tablet (500 mg total) by mouth every 8 (eight) hours as needed for muscle spasms. 10/17/23   Caleen Qualia, MD  methotrexate  (RHEUMATREX) 2.5 MG tablet Take 20 mg by mouth every Thursday.    [provider]  Multiple Vitamin (MULTIVITAMIN WITH MINERALS) TABS tablet Take 1 tablet by mouth daily. 10/30/22   Patel, Sona, MD  nicotine  (NICODERM CQ  - DOSED IN MG/24 HOURS) 21 mg/24hr patch Place 1 patch (21 mg total) onto the skin daily. Patient not taking: Reported on 10/11/2023 10/30/22   Patel, Sona, MD  nicotine  polacrilex (NICORETTE ) 2 MG gum Take 1 each  (2 mg total) by mouth as needed for smoking cessation. 10/17/23   Caleen Qualia, MD  omeprazole  (PRILOSEC  OTC) 20 MG tablet Take 20 mg by mouth daily.    [provider]  oxyCODONE  (ROXICODONE ) 5 MG immediate release tablet Take 1 tablet (5 mg total) by mouth every 8 (eight) hours as needed. 11/10/23 11/09/24  Fisher, Devere ORN, PA-C  oxyCODONE -acetaminophen  (PERCOCET/ROXICET) 5-325 MG tablet Take 1 tablet by mouth every 6 (six) hours as needed for moderate pain (pain score 4-6). 10/17/23   Amin, Sumayya, MD  PARoxetine  (PAXIL ) 10 MG tablet Take 10 mg by mouth daily.    [provider]  predniSONE  (DELTASONE ) 5 MG tablet Take 5 mg by mouth daily. Patient not taking: Reported on 10/11/2023    [provider]  sodium chloride  1 g tablet Take 1 tablet (1 g total) by mouth 2 (two) times daily with a meal. 10/17/23   Caleen Qualia, MD  thiamine  (VITAMIN B-1) 100 MG tablet Take 1 tablet (100 mg total) by mouth daily. 10/30/22   Patel, Sona, MD  Turmeric 500 MG CAPS Take 500 mg by mouth daily.    [provider]  Vitamin D , Ergocalciferol , (DRISDOL) 1.25 MG (50000 UNIT) CAPS capsule Take 50,000 Units by mouth once a week.    [provider]    Physical Exam   Triage Vital Signs: ED Triage Vitals [12/30/23 0511]  Encounter Vitals Group     BP (!) 145/80     Girls Systolic BP Percentile      Girls Diastolic BP Percentile      Boys Systolic BP Percentile      Boys Diastolic BP Percentile      Pulse      Resp 17     Temp 98.8 F (37.1 C)     Temp Source Oral     SpO2 95 %     Weight      Height      Head Circumference      Peak Flow      Pain Score      Pain Loc      Pain Education      Exclude from Growth Chart     Most recent vital signs: Vitals:   12/30/23 0511 12/30/23 0520  BP: (!) 145/80   Pulse: 92   Resp: 17   Temp: 98.8 F (37.1 C) 99 F (37.2 C)  SpO2: 95%      CONSTITUTIONAL: Alert, oriented x 3, responds appropriately to  questions.  Thin, elderly, chronically ill-appearing HEAD: Normocephalic; atraumatic EYES: Conjunctivae clear, PERRL, EOMI ENT: normal nose; no rhinorrhea; moist mucous membranes; pharynx without lesions noted; no dental injury; no septal hematoma, no epistaxis; no facial deformity or bony tenderness NECK: Supple, no midline spinal tenderness, step-off or deformity; trachea midline  CARD: RRR; S1 and S2 appreciated; no murmurs, no clicks, no rubs, no gallops RESP: Patient is tachypneic.  She has rhonchorous breath sounds diffusely.  Speaking in truncated sentences.  No hypoxia.  No wheezing. CHEST:  chest wall stable, no crepitus or ecchymosis or deformity, nontender to palpation; no flail chest ABD/GI: Non-distended; soft, non-tender, no rebound, no guarding; no ecchymosis or other lesions noted PELVIS:  stable, nontender to palpation BACK:  The back appears normal; no midline spinal tenderness, step-off or deformity EXT: Normal ROM in all joints; no edema; normal capillary refill; no cyanosis, no bony tenderness or bony deformity of patient's extremities, no joint effusions, compartments are soft, extremities are warm and well-perfused, no ecchymosis, no calf tenderness or calf swelling, superficial skin tears noted to the left upper extremity SKIN: Normal color for age and race; warm NEURO: No facial asymmetry, normal speech, moving all extremities equally, ambulates with assistance  ED Results / Procedures / Treatments   LABS: (all labs ordered are listed, but only abnormal results are displayed) Labs Reviewed  CBC WITH DIFFERENTIAL/PLATELET - Abnormal; Notable for the following components:      Result Value   RBC 3.66 (*)    MCV 104.6 (*)    RDW 16.0 (*)    Monocytes Absolute 1.3 (*)    All other components within normal limits  COMPREHENSIVE METABOLIC PANEL WITH GFR - Abnormal; Notable for the following components:   Glucose, Bld 103 (*)    All other components within normal limits   BRAIN NATRIURETIC PEPTIDE - Abnormal; Notable for the following components:   B Natriuretic Peptide 148.0 (*)    All other components within normal limits  RESP PANEL BY RT-PCR (RSV, FLU A&B, COVID)  RVPGX2  LIPASE, BLOOD  TROPONIN I (HIGH SENSITIVITY)     EKG:  EKG Interpretation Date/Time:  Friday December 30 2023 05:29:05 EST Ventricular Rate:  74 PR Interval:  125 QRS Duration:  114 QT Interval:  395 QTC Calculation: 439 R Axis:   81  Text Interpretation: Sinus rhythm Ventricular premature complex Probable left atrial enlargement Incomplete right bundle branch block Baseline wander in lead(s) V4 Confirmed by Neomi Neptune [phone]) on 12/30/2023 5:46:40 AM          RADIOLOGY: My personal review and interpretation of imaging: 3 acute right-sided rib fractures.  No pneumothorax.  No PE.  I have personally reviewed all radiology reports. CT ABDOMEN PELVIS W CONTRAST Result Date: 12/30/2023 EXAM: CT ABDOMEN AND PELVIS WITH CONTRAST 12/30/2023 06:27:48 AM TECHNIQUE: CT of the abdomen and pelvis was performed with the administration of 75 mL iohexol (OMNIPAQUE) 350 MG/ML injection. Multiplanar reformatted images are provided for review. Automated exposure control, iterative reconstruction, and/or weight-based adjustment of the mA/kV was utilized to reduce the radiation dose to as low as reasonably achievable. COMPARISON: CTA chest 12/30/2023 reported separately. Non-contrast CT abdomen and pelvis 06/13/2021. CLINICAL HISTORY: 73 year old female. Recent fall, has right upper quadrant abdominal pain on exam. FINDINGS: LOWER CHEST: CTA chest is reported separately today. LIVER: Liver enhancement remains within normal limits. GALLBLADDER AND BILE DUCTS: Evidence of a 6 mm gallstone in the neck of the gallbladder, but the gallbladder is nondistended, no regional inflammation. No biliary ductal dilatation. SPLEEN: No acute abnormality. PANCREAS: No acute abnormality. ADRENAL GLANDS: No acute  abnormality. KIDNEYS, URETERS AND BLADDER: Symmetric renal enhancement and contrast excretion. Normal ureters. No stones in the kidneys or ureters. No hydronephrosis. No perinephric or periureteral stranding. Diminutive, unremarkable bladder. GI AND BOWEL:  Redundant large bowel with retained stool. Sigmoid diverticulosis; no active large bowel inflammation identified. Diminutive or absent appendix. Nondilated small bowel and stomach. There is no bowel obstruction. PERITONEUM AND RETROPERITONEUM: No ascites. No free air. No pelvis free fluid. VASCULATURE: Bulky calcified aortoiliac atherosclerosis. Tortuous abdominal aorta. Occluded left common iliac artery on series 2 image 39, reconstituted left iliac artery by bifurcation on series 2 image 50. Additional bilateral iliac and bilateral proximal femoral artery atherosclerosis, but the other major arterial structures remain patent. Portal venous system appears to be patent. LYMPH NODES: No lymphadenopathy. REPRODUCTIVE ORGANS: No acute abnormality. BONES AND SOFT TISSUES: Advanced lumbar spine degeneration with evidence of multilevel developing degenerative interbody ankylosis (most notably L4-L5). Maintained lumbar vertebral height. Underlying moderate chronic dextroconvex lumbar scoliosis. Chronic pubic symphysis and bilateral hip degeneration. Chronic right sacral ala fracture with sclerosis is stable. Interval healing of left acetabular and inferior pubic ramus fractures. No focal soft tissue abnormality. IMPRESSION: 1. Bulky calcified aortoiliac atherosclerosis with occluded left common iliac artery but distal reconstitution. Other major arterial structures remain patent. 2. No acute or inflammatory process identified in the abdomen or pelvis. 3. Cholelithiasis. 4. CTA Chest reported separately. Electronically signed by: Helayne Hurst MD 12/30/2023 06:53 AM EST RP Workstation: [id number]   CT Angio Chest PE W and/or Wo Contrast Result Date: 12/30/2023 EXAM: CTA  CHEST 12/30/2023 06:27:48 AM TECHNIQUE: CTA of the chest was performed without and with the administration of 75 mL of intravenous contrast (iohexol (OMNIPAQUE) 350 MG/ML injection 75 mL IOHEXOL 350 MG/ML SOLN). Multiplanar reformatted images are provided for review. MIP images are provided for review. Automated exposure control, iterative reconstruction, and/or weight based adjustment of the mA/kV was utilized to reduce the radiation dose to as low as reasonably achievable. COMPARISON: Chest CT without contrast 11/10/2023. CT abdomen and pelvis today reported separately. CLINICAL HISTORY: 73 year old female with tachycardia, tachypnea, recent fall and rib injury, worsening pain, increased work of breathing, and rhonchorous breath sounds. FINDINGS: PULMONARY ARTERIES: Pulmonary arteries are adequately opacified for evaluation. Good pulmonary artery contrast timing. Marked central pulmonary artery enlargement nearly 4 cm diameter (series 6 image 204). No pulmonary artery filling defect. Respiratory motion artifact at the basal segments of both lungs. MEDIASTINUM: Heart size at the upper limits of normal. No pericardial effusion. Superimposed dilated / ectatic thoracic aorta also no thoracic aortic aneurysm or dissection. Advanced calcified thoracic aortic atherosclerosis. LYMPH NODES: No mediastinal, hilar or axillary lymphadenopathy. LUNGS AND PLEURA: Centrilobular emphysema is diffuse. Mild to moderate retained secretions in the trachea carina. Major airways remain patent. There is chronic bronchiectasis and atelectasis in the medial segment of the right middle lobe, unchanged. No new pulmonary abnormality. No evidence of pleural effusion or pneumothorax. UPPER ABDOMEN: Dedicated CT abdomen and pelvis today reported separately. SOFT TISSUES AND BONES: Partially visible right shoulder arthroplasty with streak artifact. Healing left anterior rib fracture such as the anterior left third rib on series 5 image 56.  Healing left posterior 11th and lateral 10th left rib fractures which were present in september. Lesser healing of posterior left 12th rib fracture, also present in september. Healing right posterior 12th rib fracture and lateral right 9th, 10th, and 11th rib fractures which were present in september. New and acute appearing mildly displaced right lateral 6th and 7th rib fractures. Nondisplaced right anterior 5th rib fracture is age indeterminate. Subacute appearing nondisplaced sternal fracture with some healing sagittal image 71. This appears new since september. Stable thoracic vertebral height and alignment, no significant thoracic compression.  IMPRESSION: 1. No acute pulmonary embolism identified. Chronic central pulmonary artery enlargement suggesting pulmonary hypertension. Emphysema. 2. Acute mildly displaced right lateral 6th and 7th rib fractures, nondisplaced anterior 5th rib fracture. Subacute nondisplaced sternal fracture. 3. Numerous additional healing bilateral rib fractures which were present in September. Electronically signed by: Helayne Hurst MD 12/30/2023 06:48 AM EST RP Workstation: [id number]   CT Cervical Spine Wo Contrast Result Date: 12/30/2023 EXAM: CT CERVICAL SPINE WITHOUT CONTRAST 12/30/2023 06:27:48 AM TECHNIQUE: CT of the cervical spine was performed without the administration of intravenous contrast. Multiplanar reformatted images are provided for review. Automated exposure control, iterative reconstruction, and/or weight based adjustment of the mA/kV was utilized to reduce the radiation dose to as low as reasonably achievable. COMPARISON: CT Cervical Spine 11/11/2023. Head CT reported separately today. CLINICAL HISTORY: 73 year old female with neck trauma, pain, and rib fractures. FINDINGS: CERVICAL SPINE: BONES AND ALIGNMENT: Chronic straightening and mild reversal of the normal cervical lordosis. Calcified ligamentous hypertrophy about the odontoid. No acute fracture or traumatic  malalignment. DEGENERATIVE CHANGES: Chronic severe cervical disc and endplate degeneration with evidence of developing degenerative interbody ankylosis from C4-C5 through the cervicothoracic junction. No significant cervical spinal stenosis by CT. SOFT TISSUES: Streak artifact in the non-contrast neck soft tissues. Calcified cervical carotid atherosclerosis. Negative visible non-contrast thoracic inlet. No prevertebral soft tissue swelling. IMPRESSION: 1. No acute traumatic injury identified in the cervical spine. 2. Chronic severe cervical spondylosis with developing interbody ankylosis. Electronically signed by: Helayne Hurst MD 12/30/2023 06:39 AM EST RP Workstation: [id number]   CT HEAD WO CONTRAST ( ) Result Date: 12/30/2023 EXAM: CT HEAD WITHOUT CONTRAST 12/30/2023 06:27:48 AM TECHNIQUE: CT of the head was performed without the administration of intravenous contrast. Automated exposure control, iterative reconstruction, and/or weight based adjustment of the mA/kV was utilized to reduce the radiation dose to as low as reasonably achievable. COMPARISON: Brain MRI 10/10/2023. Head CT 11/11/2023. CLINICAL HISTORY: 73 year old female with minor head trauma, pain, and rib fractures. FINDINGS: BRAIN AND VENTRICLES: No acute hemorrhage. No evidence of acute infarct. Stable brain volume. Ex vacuo appearing ventricular enlargement. Confluent bilateral cerebral white matter hypodensity is stable. Chronic dural calcification. No extra-axial collection. No mass effect or midline shift. Calcified atherosclerosis of the skull base. No suspicious intracranial vascular hyperdensity. ORBITS: No acute abnormality. SINUSES: No acute abnormality. SOFT TISSUES AND SKULL: No acute soft tissue abnormality. No skull fracture. IMPRESSION: 1. No acute intracranial abnormality  or acute traumatic injury identified. 2. Stable brain volume loss and advanced cerebral white matter disease. Electronically signed by: Helayne Hurst MD  12/30/2023 06:37 AM EST RP Workstation: [id number]     PROCEDURES:  Critical Care performed: Yes, see critical care procedure note(s)   CRITICAL CARE Performed by: Josette Sink   Total critical care time: 30 minutes  Critical care time was exclusive of separately billable procedures and treating other patients.  Critical care was necessary to treat or prevent imminent or life-threatening deterioration.  Critical care was time spent personally by me on the following activities: development of treatment plan with patient and/or surrogate as well as nursing, discussions with consultants, evaluation of patient's response to treatment, examination of patient, obtaining history from patient or surrogate, ordering and performing treatments and interventions, ordering and review of laboratory studies, ordering and review of radiographic studies, pulse oximetry and re-evaluation of patient's condition.   SABRA1-3 Lead EKG Interpretation  Performed by: Uriel Horkey, Josette SAILOR, DO Authorized by: Flara Storti, Josette SAILOR, DO     Interpretation: normal  ECG rate:  92   ECG rate assessment: normal     Rhythm: sinus rhythm     Ectopy: none     Conduction: normal       IMPRESSION / MDM / ASSESSMENT AND PLAN / ED COURSE  I reviewed the triage vital signs and the nursing notes.  Patient here with complaints of right rib and upper abdominal pain.  History of frequent falls and diagnosed with a rib fracture almost 2 months ago.  The patient is on the cardiac monitor to evaluate for evidence of arrhythmia and/or significant heart rate changes.   DIFFERENTIAL DIAGNOSIS (includes but not limited to):   Rib fracture, rib contusion, pulmonary contusion, PE, COPD exacerbation, CHF exacerbation, pneumonia, viral URI, liver laceration, bowel contusion, doubt ACS  Patient's presentation is most consistent with acute presentation with potential threat to life or bodily function.  PLAN: Will obtain labs, EKG, CTA of  the chest to evaluate for pneumonia, edema, rib fractures, pneumothorax, PE.  Also obtain a CT of her abdomen pelvis that she does have some tenderness in the right upper abdomen to further evaluate her liver and bowel in the side.  Will give breathing treatment, pain medication here.  Given she is unable to tell me when she fell, will also obtain CT of her head and cervical spine as I am not sure when her last fall was and if she hit her head or not during any of these episodes.   MEDICATIONS GIVEN IN ED: Medications  morphine  (PF) 4 MG/ML injection 4 mg (has no administration in time range)  ipratropium-albuterol  (DUONEB) 0.5-2.5 (3) MG/3ML nebulizer solution 3 mL (3 mLs Nebulization Given 12/30/23 0549)  fentaNYL  (SUBLIMAZE ) injection 50 mcg (50 mcg Intravenous Given 12/30/23 0550)  ondansetron  (ZOFRAN ) injection 4 mg (4 mg Intravenous Given 12/30/23 0550)  iohexol (OMNIPAQUE) 350 MG/ML injection 75 mL (75 mLs Intravenous Contrast Given 12/30/23 0611)  ipratropium-albuterol  (DUONEB) 0.5-2.5 (3) MG/3ML nebulizer solution 3 mL (3 mLs Nebulization Given 12/30/23 0701)  methylPREDNISolone  sodium succinate  (SOLU-MEDROL ) 125 mg/2 mL injection 125 mg (125 mg Intravenous Given 12/30/23 0702)     ED COURSE: Labs show no leukocytosis.  Normal electrolytes.  Negative troponin.  BNP only minimally elevated at 148.  CT scans reviewed and interpreted by myself and the radiologist.  Patient has acute displaced right 6th and 7th rib fractures, nondisplaced anterior fifth rib fracture and a subacute nondisplaced sternal fracture.  She also has multiple healing bilateral rib fractures that were present on imaging in September.  No PE, edema, pneumonia.  She still has increased work of breathing, rhonchorous breath sounds and wheezing here which I suspect is in part due to her COPD/emphysema.  Will continue breathing treatments and IV steroids but have recommended admission for pain control, pulmonary toilet.  She is  agreeable to this plan.  CT of her abdomen, head and cervical spine showed no acute abnormality.  She does have an occluded left common iliac artery but there is distal reconstitution.  She has equal pulses in her lower extremities and they are warm and well-perfused.   Patient has oxygen saturation in the low 80s and is now on 2 L nasal cannula.  Does not wear oxygen chronically.  Will continue breathing treatments, pain medication, oxygen as needed, incentive spirometry.   CONSULTS:  Consulted and discussed patient's case with hospitalist.  I have recommended admission and consulting physician agrees and will place admission orders.  Patient (and family if present) agree with this  plan.   I reviewed all nursing notes, vitals, pertinent previous records.  All labs, EKGs, imaging ordered have been independently reviewed and interpreted by myself.    OUTSIDE RECORDS REVIEWED: Reviewed last admission in August 2025.       FINAL CLINICAL IMPRESSION(S) / ED DIAGNOSES   Final diagnoses:  Right-sided chest pain  Multiple fractures of ribs, right side, initial encounter for closed fracture  COPD with acute exacerbation (HCC)  Acute respiratory failure with hypoxia (HCC)     Rx / DC Orders   ED Discharge Orders     None        Note:  This document was prepared using Dragon voice recognition software and may include unintentional dictation errors.   Kamylah Manzo, Josette SAILOR, DO 12/30/23 [phone]

## 2023-12-30 NOTE — ED Notes (Signed)
 Patient transported to CT

## 2023-12-30 NOTE — ED Notes (Signed)
 Attempted to wean patient off 4L [place] but patient dropped to 88% on room air.

## 2023-12-30 NOTE — H&P (Signed)
 History and Physical    Lynn Norton FMW:[id number] DOB: 06/05/1950 DOA: 12/30/2023  DOS: the patient was seen and examined on 12/30/2023  PCP: Pcp, No   Patient coming from: SNF  I have personally briefly reviewed patient's old medical records in [facility]  Chief Complaint: Right rib cage pain  HPI: Lynn Norton is a pleasant 73 y.o. female with medical history significant for multiple falls with rib fracture in the past, HTN, CKD, rheumatoid arthritis, tobacco abuse who was brought in from nursing home for right rib cage pain after a fall at the nursing home.  Patient stated that she had multiple falls in the past and had rib fractures.  She stated that she had a fall at the nursing home on December 24, 2023 since then she had right rib cage pain.  She was tachypneic and wheezing.  She does not have a history of DVT or PE.  She does not wear oxygen at the nursing home.  She denies any fevers, chills, cough, shortness of breath, nausea, vomiting, urinary discomforts.  She is not a great historian.  ED Course: Upon arrival to the ED, patient is found to be in sinus rhythm with ventricular rate of 74 on EKG, no ST elevations, CT chest showed right-sided 3 acute rib fracture, no pneumothorax no PE, CT cervical spine showed no spine fracture, CT head no acute pathology.  Patient was given morphine , oxygen, nebulization, Solu-Medrol  and hospitalist service was consulted for evaluation for admission.  Review of Systems:  ROS  All other systems negative except as noted in the HPI.  Past Medical History:  Diagnosis Date   Arthritis    CKD (chronic kidney disease), stage IV (HCC)    HTN (hypertension)    RA (rheumatoid arthritis) (HCC)    Tobacco abuse     Past Surgical History:  Procedure Laterality Date   ORIF ELBOW FRACTURE Right 04/27/2021   Procedure: OPEN REDUCTION INTERNAL FIXATION (ORIF) ELBOW/OLECRANON FRACTURE;  Surgeon: Kathlynn Sharper, MD;  Location: [facility] ORS;  Service:  Orthopedics;  Laterality: Right;   ORIF WRIST FRACTURE Right 10/27/2022   Procedure: OPEN REDUCTION INTERNAL FIXATION (ORIF) WRIST FRACTURE;  Surgeon: Edie Norleen PARAS, MD;  Location: [facility] ORS;  Service: Orthopedics;  Laterality: Right;     reports that she has been smoking cigarettes. She has a 50 pack-year smoking history. She has never used smokeless tobacco. She reports that she does not currently use alcohol. She reports that she does not use drugs.  Allergies  Allergen Reactions   Other Itching and Other (See Comments)   Sulfa Antibiotics Other (See Comments) and Itching   Ceftriaxone Rash   Penicillins Rash and Other (See Comments)   Rocephin [Ceftriaxone] Hives    Spoke with patient. States she had hives that spread in response to rocephin   Sulfa Antibiotics    Penicillins Itching    Family History  Problem Relation Age of Onset   Heart attack Brother     Prior to Admission medications   Medication Sig Start Date End Date Taking? Authorizing Provider  omeprazole  (PRILOSEC ) 20 MG capsule Take 20 mg by mouth daily. 12/27/23  Yes [provider]  acetaminophen  (TYLENOL ) 325 MG tablet Take 650 mg by mouth every 6 (six) hours as needed for mild pain (pain score 1-3) or moderate pain (pain score 4-6).    [provider]  albuterol  (VENTOLIN  HFA) 108 (90 Base) MCG/ACT inhaler Inhale 1-2 puffs into the lungs 4 (four) times daily as  needed for wheezing or shortness of breath.    [provider]  amLODipine  (NORVASC ) 10 MG tablet Take 1 tablet (10 mg total) by mouth daily. Patient taking differently: Take 5 mg by mouth daily. 10/30/22   Patel, Sona, MD  dextromethorphan-guaiFENesin  (MUCINEX  DM) 30-600 MG 12hr tablet Take 1 tablet by mouth 2 (two) times daily as needed for cough. 10/17/23   Caleen Qualia, MD  docusate sodium  (COLACE) 100 MG capsule Take 1 capsule (100 mg total) by mouth 2 (two) times daily. Patient not taking: Reported on 10/11/2023 10/29/22   Patel,  Sona, MD  feeding supplement (ENSURE PLUS HIGH PROTEIN) LIQD Take 237 mLs by mouth 2 (two) times daily between meals. 10/17/23   Caleen Qualia, MD  folic acid  (FOLVITE ) 1 MG tablet Take 1 mg by mouth daily.    [provider]  ibuprofen  (ADVIL ) 600 MG tablet Take 1 tablet (600 mg total) by mouth every 8 (eight) hours as needed for mild pain (pain score 1-3) or fever (temp > 38.3 Celsius). 10/17/23   Caleen Qualia, MD  lidocaine  (LIDODERM ) 5 % Place 1 patch onto the skin at bedtime. Remove & Discard patch within 12 hours or as directed by MD 10/17/23   Caleen Qualia, MD  methocarbamol  (ROBAXIN ) 500 MG tablet Take 1 tablet (500 mg total) by mouth every 8 (eight) hours as needed for muscle spasms. 10/17/23   Caleen Qualia, MD  methotrexate  (RHEUMATREX) 2.5 MG tablet Take 20 mg by mouth every Thursday.    [provider]  Multiple Vitamin (MULTIVITAMIN WITH MINERALS) TABS tablet Take 1 tablet by mouth daily. 10/30/22   Patel, Sona, MD  nicotine  (NICODERM CQ  - DOSED IN MG/24 HOURS) 21 mg/24hr patch Place 1 patch (21 mg total) onto the skin daily. Patient not taking: Reported on 10/11/2023 10/30/22   Patel, Sona, MD  nicotine  polacrilex (NICORETTE ) 2 MG gum Take 1 each (2 mg total) by mouth as needed for smoking cessation. 10/17/23   Caleen Qualia, MD  omeprazole  (PRILOSEC  OTC) 20 MG tablet Take 20 mg by mouth daily.    [provider]  oxyCODONE  (ROXICODONE ) 5 MG immediate release tablet Take 1 tablet (5 mg total) by mouth every 8 (eight) hours as needed. 11/10/23 11/09/24  Fisher, Devere ORN, PA-C  oxyCODONE -acetaminophen  (PERCOCET/ROXICET) 5-325 MG tablet Take 1 tablet by mouth every 6 (six) hours as needed for moderate pain (pain score 4-6). 10/17/23   Caleen Qualia, MD  PARoxetine  (PAXIL ) 10 MG tablet Take 10 mg by mouth daily.    [provider]  predniSONE  (DELTASONE ) 5 MG tablet Take 5 mg by mouth daily. Patient not taking: Reported on 10/11/2023    [provider]   sodium chloride  1 g tablet Take 1 tablet (1 g total) by mouth 2 (two) times daily with a meal. 10/17/23   Caleen Qualia, MD  thiamine  (VITAMIN B-1) 100 MG tablet Take 1 tablet (100 mg total) by mouth daily. 10/30/22   Patel, Sona, MD  Turmeric 500 MG CAPS Take 500 mg by mouth daily.    [provider]  Vitamin D , Ergocalciferol , (DRISDOL) 1.25 MG (50000 UNIT) CAPS capsule Take 50,000 Units by mouth once a week.    [provider]    Physical Exam: Vitals:   12/30/23 0800 12/30/23 0830 12/30/23 0900 12/30/23 1000  BP: 125/62 112/64 122/61 127/64  Pulse: 96 79 90 94  Resp: (!) 22 16 17 18   Temp:      TempSrc:  SpO2: 98% 96% 95% 96%    Physical Exam   Constitutional: Alert, awake, calm, comfortable HEENT: Neck supple Respiratory: Clear to auscultation B/L, no wheezing, no rales.  Cardiovascular: Regular rate and rhythm, no murmurs / rubs / gallops. No extremity edema. 2+ pedal pulses. No carotid bruits.  Abdomen: Soft, no tenderness, Bowel sounds positive.  Musculoskeletal: no clubbing / cyanosis. Good ROM, no contractures. Normal muscle tone.  Skin: no rashes, lesions, ulcers. Neurologic: CN 2-12 grossly intact. Sensation intact, No focal deficit identified Psychiatric: Alert and oriented x 3. Normal mood.    Labs on Admission: I have personally reviewed following labs and imaging studies  CBC: Recent Labs  Lab 12/30/23 0524  WBC 10.5  NEUTROABS 7.3  HGB 12.4  HCT 38.3  MCV 104.6*  PLT 307   Basic Metabolic Panel: Recent Labs  Lab 12/30/23 0524  NA 141  K 4.1  CL 101  CO2 28  GLUCOSE 103*  BUN 23  CREATININE 0.91  CALCIUM 8.9   GFR: Estimated Creatinine Clearance: 38.4 mL/min (by C-G formula based on SCr of 0.91 mg/dL). Liver Function Tests: Recent Labs  Lab 12/30/23 0524  AST 27  ALT 20  ALKPHOS 63  BILITOT 0.7  PROT 7.0  ALBUMIN 3.9   Recent Labs  Lab 12/30/23 0524  LIPASE 35   No results for input(s): AMMONIA in the last  168 hours. Coagulation Profile: No results for input(s): INR, PROTIME in the last 168 hours. Cardiac Enzymes: Recent Labs  Lab 12/30/23 0524  TROPONINIHS 13   BNP (last 3 results) Recent Labs    12/30/23 0524  BNP 148.0*   HbA1C: No results for input(s): HGBA1C in the last 72 hours. CBG: No results for input(s): GLUCAP in the last 168 hours. Lipid Profile: No results for input(s): CHOL, HDL, LDLCALC, TRIG, CHOLHDL, LDLDIRECT in the last 72 hours. Thyroid Function Tests: No results for input(s): TSH, T4TOTAL, FREET4, T3FREE, THYROIDAB in the last 72 hours. Anemia Panel: No results for input(s): VITAMINB12, FOLATE, FERRITIN, TIBC, IRON, RETICCTPCT in the last 72 hours. Urine analysis:    Component Value Date/Time   COLORURINE YELLOW (A) 11/11/2023 2023   APPEARANCEUR CLEAR (A) 11/11/2023 2023   LABSPEC 1.010 11/11/2023 2023   PHURINE 6.0 11/11/2023 2023   GLUCOSEU NEGATIVE 11/11/2023 2023   HGBUR NEGATIVE 11/11/2023 2023   BILIRUBINUR NEGATIVE 11/11/2023 2023   KETONESUR NEGATIVE 11/11/2023 2023   PROTEINUR NEGATIVE 11/11/2023 2023   NITRITE NEGATIVE 11/11/2023 2023   LEUKOCYTESUR SMALL (A) 11/11/2023 2023    Radiological Exams on Admission: I have personally reviewed images CT ABDOMEN PELVIS W CONTRAST Result Date: 12/30/2023 EXAM: CT ABDOMEN AND PELVIS WITH CONTRAST 12/30/2023 06:27:48 AM TECHNIQUE: CT of the abdomen and pelvis was performed with the administration of 75 mL iohexol (OMNIPAQUE) 350 MG/ML injection. Multiplanar reformatted images are provided for review. Automated exposure control, iterative reconstruction, and/or weight-based adjustment of the mA/kV was utilized to reduce the radiation dose to as low as reasonably achievable. COMPARISON: CTA chest 12/30/2023 reported separately. Non-contrast CT abdomen and pelvis 06/13/2021. CLINICAL HISTORY: 74 year old female. Recent fall, has right upper quadrant abdominal pain on  exam. FINDINGS: LOWER CHEST: CTA chest is reported separately today. LIVER: Liver enhancement remains within normal limits. GALLBLADDER AND BILE DUCTS: Evidence of a 6 mm gallstone in the neck of the gallbladder, but the gallbladder is nondistended, no regional inflammation. No biliary ductal dilatation. SPLEEN: No acute abnormality. PANCREAS: No acute abnormality. ADRENAL GLANDS: No acute abnormality. KIDNEYS,  URETERS AND BLADDER: Symmetric renal enhancement and contrast excretion. Normal ureters. No stones in the kidneys or ureters. No hydronephrosis. No perinephric or periureteral stranding. Diminutive, unremarkable bladder. GI AND BOWEL: Redundant large bowel with retained stool. Sigmoid diverticulosis; no active large bowel inflammation identified. Diminutive or absent appendix. Nondilated small bowel and stomach. There is no bowel obstruction. PERITONEUM AND RETROPERITONEUM: No ascites. No free air. No pelvis free fluid. VASCULATURE: Bulky calcified aortoiliac atherosclerosis. Tortuous abdominal aorta. Occluded left common iliac artery on series 2 image 39, reconstituted left iliac artery by bifurcation on series 2 image 50. Additional bilateral iliac and bilateral proximal femoral artery atherosclerosis, but the other major arterial structures remain patent. Portal venous system appears to be patent. LYMPH NODES: No lymphadenopathy. REPRODUCTIVE ORGANS: No acute abnormality. BONES AND SOFT TISSUES: Advanced lumbar spine degeneration with evidence of multilevel developing degenerative interbody ankylosis (most notably L4-L5). Maintained lumbar vertebral height. Underlying moderate chronic dextroconvex lumbar scoliosis. Chronic pubic symphysis and bilateral hip degeneration. Chronic right sacral ala fracture with sclerosis is stable. Interval healing of left acetabular and inferior pubic ramus fractures. No focal soft tissue abnormality. IMPRESSION: 1. Bulky calcified aortoiliac atherosclerosis with occluded  left common iliac artery but distal reconstitution. Other major arterial structures remain patent. 2. No acute or inflammatory process identified in the abdomen or pelvis. 3. Cholelithiasis. 4. CTA Chest reported separately. Electronically signed by: Helayne Hurst MD 12/30/2023 06:53 AM EST RP Workstation: [id number]   CT Angio Chest PE W and/or Wo Contrast Result Date: 12/30/2023 EXAM: CTA CHEST 12/30/2023 06:27:48 AM TECHNIQUE: CTA of the chest was performed without and with the administration of 75 mL of intravenous contrast (iohexol (OMNIPAQUE) 350 MG/ML injection 75 mL IOHEXOL 350 MG/ML SOLN). Multiplanar reformatted images are provided for review. MIP images are provided for review. Automated exposure control, iterative reconstruction, and/or weight based adjustment of the mA/kV was utilized to reduce the radiation dose to as low as reasonably achievable. COMPARISON: Chest CT without contrast 11/10/2023. CT abdomen and pelvis today reported separately. CLINICAL HISTORY: 73 year old female with tachycardia, tachypnea, recent fall and rib injury, worsening pain, increased work of breathing, and rhonchorous breath sounds. FINDINGS: PULMONARY ARTERIES: Pulmonary arteries are adequately opacified for evaluation. Good pulmonary artery contrast timing. Marked central pulmonary artery enlargement nearly 4 cm diameter (series 6 image 204). No pulmonary artery filling defect. Respiratory motion artifact at the basal segments of both lungs. MEDIASTINUM: Heart size at the upper limits of normal. No pericardial effusion. Superimposed dilated / ectatic thoracic aorta also no thoracic aortic aneurysm or dissection. Advanced calcified thoracic aortic atherosclerosis. LYMPH NODES: No mediastinal, hilar or axillary lymphadenopathy. LUNGS AND PLEURA: Centrilobular emphysema is diffuse. Mild to moderate retained secretions in the trachea carina. Major airways remain patent. There is chronic bronchiectasis and atelectasis in the  medial segment of the right middle lobe, unchanged. No new pulmonary abnormality. No evidence of pleural effusion or pneumothorax. UPPER ABDOMEN: Dedicated CT abdomen and pelvis today reported separately. SOFT TISSUES AND BONES: Partially visible right shoulder arthroplasty with streak artifact. Healing left anterior rib fracture such as the anterior left third rib on series 5 image 56. Healing left posterior 11th and lateral 10th left rib fractures which were present in september. Lesser healing of posterior left 12th rib fracture, also present in september. Healing right posterior 12th rib fracture and lateral right 9th, 10th, and 11th rib fractures which were present in september. New and acute appearing mildly displaced right lateral 6th and 7th rib fractures. Nondisplaced right anterior 5th  rib fracture is age indeterminate. Subacute appearing nondisplaced sternal fracture with some healing sagittal image 71. This appears new since september. Stable thoracic vertebral height and alignment, no significant thoracic compression. IMPRESSION: 1. No acute pulmonary embolism identified. Chronic central pulmonary artery enlargement suggesting pulmonary hypertension. Emphysema. 2. Acute mildly displaced right lateral 6th and 7th rib fractures, nondisplaced anterior 5th rib fracture. Subacute nondisplaced sternal fracture. 3. Numerous additional healing bilateral rib fractures which were present in September. Electronically signed by: Helayne Hurst MD 12/30/2023 06:48 AM EST RP Workstation: [id number]   CT Cervical Spine Wo Contrast Result Date: 12/30/2023 EXAM: CT CERVICAL SPINE WITHOUT CONTRAST 12/30/2023 06:27:48 AM TECHNIQUE: CT of the cervical spine was performed without the administration of intravenous contrast. Multiplanar reformatted images are provided for review. Automated exposure control, iterative reconstruction, and/or weight based adjustment of the mA/kV was utilized to reduce the radiation dose to as  low as reasonably achievable. COMPARISON: CT Cervical Spine 11/11/2023. Head CT reported separately today. CLINICAL HISTORY: 73 year old female with neck trauma, pain, and rib fractures. FINDINGS: CERVICAL SPINE: BONES AND ALIGNMENT: Chronic straightening and mild reversal of the normal cervical lordosis. Calcified ligamentous hypertrophy about the odontoid. No acute fracture or traumatic malalignment. DEGENERATIVE CHANGES: Chronic severe cervical disc and endplate degeneration with evidence of developing degenerative interbody ankylosis from C4-C5 through the cervicothoracic junction. No significant cervical spinal stenosis by CT. SOFT TISSUES: Streak artifact in the non-contrast neck soft tissues. Calcified cervical carotid atherosclerosis. Negative visible non-contrast thoracic inlet. No prevertebral soft tissue swelling. IMPRESSION: 1. No acute traumatic injury identified in the cervical spine. 2. Chronic severe cervical spondylosis with developing interbody ankylosis. Electronically signed by: Helayne Hurst MD 12/30/2023 06:39 AM EST RP Workstation: [id number]   CT HEAD WO CONTRAST ( ) Result Date: 12/30/2023 EXAM: CT HEAD WITHOUT CONTRAST 12/30/2023 06:27:48 AM TECHNIQUE: CT of the head was performed without the administration of intravenous contrast. Automated exposure control, iterative reconstruction, and/or weight based adjustment of the mA/kV was utilized to reduce the radiation dose to as low as reasonably achievable. COMPARISON: Brain MRI 10/10/2023. Head CT 11/11/2023. CLINICAL HISTORY: 73 year old female with minor head trauma, pain, and rib fractures. FINDINGS: BRAIN AND VENTRICLES: No acute hemorrhage. No evidence of acute infarct. Stable brain volume. Ex vacuo appearing ventricular enlargement. Confluent bilateral cerebral white matter hypodensity is stable. Chronic dural calcification. No extra-axial collection. No mass effect or midline shift. Calcified atherosclerosis of the skull base. No  suspicious intracranial vascular hyperdensity. ORBITS: No acute abnormality. SINUSES: No acute abnormality. SOFT TISSUES AND SKULL: No acute soft tissue abnormality. No skull fracture. IMPRESSION: 1. No acute intracranial abnormality  or acute traumatic injury identified. 2. Stable brain volume loss and advanced cerebral white matter disease. Electronically signed by: Helayne Hurst MD 12/30/2023 06:37 AM EST RP Workstation: [id number]    EKG: My personal interpretation of EKG shows: Sinus rhythm at 74 bpm no ST elevation    Assessment/Plan Principal Problem:   COPD exacerbation (HCC) Active Problems:   Closed rib fracture   Rheumatoid arthritis (HCC)   Tobacco use   Gait instability   Falls, subsequent encounter    Assessment and Plan: 73 year old female with history of hypertension, CKD, rheumatoid arthritis, tobacco abuse, history of multiple falls in the past who was brought in for right-sided rib cage pain.  1.  Frequent fall/repeated rib fracture/new rib fracture on right side 5-6-7 -She will be placed in observation - She does not have a pneumothorax, hemothorax - She will be given pain medications -  Incentive spirometry - PT/OT for fall  2.  Hypoxemia likely due to pain - Will give her incentive spirometry - Pain medications - Give oxygen to maintain saturation more than 90%, will continue to wean as able  3.  COPD exacerbation - She will be given steroid IV and nebulization - Continue oxygen to maintain saturation more than 90%  4.  HTN/rheumatoid arthritis - Resume home medication  5.  Chronic tobacco abuse - She will be given nicotine  patch  6.  History of malnutrition - Continue Ensure supplementation  7.  GERD - Patient normally takes omeprazole  - While she is in the hospital and using a steroid, she will be placed on Protonix  IV twice daily     DVT prophylaxis: Lovenox  Code Status: DNR/DNI(Do NOT Intubate) Family Communication: None Disposition Plan:  Back to skilled nursing facility Consults called: PT/OT Admission status: Observation, Telemetry bed   Nena Rebel, MD [facility] 12/30/2023, 10:48 AM

## 2023-12-30 NOTE — Evaluation (Signed)
 Physical Therapy Evaluation Patient Details Name: Lynn Norton MRN: [id number] DOB: 08/24/1950 Today's Date: 12/30/2023  History of Present Illness  73 y.o. female who was brought in from nursing home for right rib cage pain after a fall at the nursing home. CT angio chest negative for PE and subacute nondisplaced sternal fracture. Current MD assessment: COPD exacerbation, frequent falls with repeated rib fxs and acute mildly displaced right lateral 6th and 7th rib fractures and nondisplaced anterior 5th rib fracture. PMH of multiple falls with rib fracture in the past, HTN, CKD, rheumatoid arthritis, tobacco abuse.   Clinical Impression  Pt admitted with above diagnosis. Pt currently with functional limitations due to the deficits listed below (see PT Problem List). Pt received upright in bed agreeable to PT/OT co-eval due to likely poor pain tolerance for multiple rib fractures. Pt poor historian stating she lives alone but per EMR lives at ALF.   To date, pt reliant on [facility] CGA to minA for supine to sitting EOB and HHA+2 for STS and ambulation to [facility]. Pt urinates and able to perform pericare independently. Performs STS and ambulates back to recliner. Notable gait abnormality with step to pattern and LLE maintained in extension at knee and circumductory gait in swing phase. Unsure if this is new or pt's baseline. Pt set up in recliner with lunch tray with all needs in reach. If pt has needed 24/7 assist for ALF, pt safe to return to home disposition as pt is at a very high falls risk. If 24/7 isn't attainable, recommend PT <3 hours/day to address deficits.      If plan is discharge home, recommend the following: A little help with walking and/or transfers;A little help with bathing/dressing/bathroom;Assistance with cooking/housework;Help with stairs or ramp for entrance;Direct supervision/assist for medications management   Can travel by private vehicle   Yes    Equipment Recommendations  Other (comment) (TBD by next venue of care)  Recommendations for Other Services       Functional Status Assessment Patient has had a recent decline in their functional status and demonstrates the ability to make significant improvements in function in a reasonable and predictable amount of time.     Precautions / Restrictions Precautions Precautions: Fall Recall of Precautions/Restrictions: Impaired Restrictions Weight Bearing Restrictions Per Provider Order: No      Mobility  Bed Mobility Overal bed mobility: Needs Assistance Bed Mobility: Supine to Sit     Supine to sit: Contact guard, Min assist     General bed mobility comments: reached for therapists hands to pull self up and forward to EOB Patient Response: Flat affect  Transfers Overall transfer level: Needs assistance Equipment used: 2 person hand held assist Transfers: Sit to/from Stand Sit to Stand: Min assist, +2 safety/equipment           General transfer comment: HHA x2 to stand from EOB and ambulate to [facility] and back    Ambulation/Gait Ambulation/Gait assistance: Contact guard assist Gait Distance (Feet): 30 Feet Assistive device: 1 person hand held assist Gait Pattern/deviations: Step-to pattern       General Gait Details: Maintains LLE in extension. Circumduction of LLE in swing phase. generally waddling gait needing atleast single HHA.  Stairs            Wheelchair Mobility     Tilt Bed Tilt Bed Patient Response: Flat affect  Modified Rankin (Stroke Patients Only)       Balance Overall balance assessment: Needs assistance Sitting-balance support: Feet supported Sitting balance-Leahy  Scale: Fair     Standing balance support: Single extremity supported, Bilateral upper extremity supported, During functional activity Standing balance-Leahy Scale: Poor Standing balance comment: +1-2 HHA to maintain balance                             Pertinent Vitals/Pain Pain  Assessment Pain Assessment: Faces Faces Pain Scale: Hurts a little bit Pain Location: ribs Pain Descriptors / Indicators: Sore Pain Intervention(s): Limited activity within patient's tolerance, Repositioned    Home Living Family/patient expects to be discharged to:: Assisted living Living Arrangements: Other (Comment)               Home Equipment: Cane - single Librarian, Academic (2 wheels);Cane - quad;Shower seat - built in;Grab bars - tub/shower Additional Comments: No family/caregivers present to verify information provided.    Prior Function Prior Level of Function : Patient poor historian/Family not available;History of Falls (last six months)               ADLs Comments: pt reports living alone, but per chart she lives at [facility]     Extremity/Trunk Assessment   Upper Extremity Assessment Upper Extremity Assessment: Generalized weakness    Lower Extremity Assessment Lower Extremity Assessment: Generalized weakness       Communication   Communication Communication: No apparent difficulties    Cognition Arousal: Alert Behavior During Therapy: WFL for tasks assessed/performed   PT - Cognitive impairments: Difficult to assess, No family/caregiver present to determine baseline                         Following commands: Impaired Following commands impaired: Follows one step commands with increased time     Cueing Cueing Techniques: Verbal cues     General Comments General comments (skin integrity, edema, etc.): Maintains SPO2 >/= 92% during activity on 2.5 L/min    Exercises     Assessment/Plan    PT Assessment Patient needs continued PT services  PT Problem List Decreased strength;Decreased activity tolerance;Decreased balance       PT Treatment Interventions DME instruction;Gait training;Patient/family education;Stair training;Functional mobility training;Therapeutic activities;Therapeutic exercise;Balance  training;Neuromuscular re-education    PT Goals (Current goals can be found in the Care Plan section)  Acute Rehab PT Goals PT Goal Formulation: Patient unable to participate in goal setting Time For Goal Achievement: 01/13/24 Potential to Achieve Goals: Good    Frequency Min 2X/week     Co-evaluation   Reason for Co-Treatment: For patient/therapist safety PT goals addressed during session: Mobility/safety with mobility OT goals addressed during session: ADL's and self-care       AM-PAC PT 6 Clicks Mobility  Outcome Measure Help needed turning from your back to your side while in a flat bed without using bedrails?: A Lot Help needed moving from lying on your back to sitting on the side of a flat bed without using bedrails?: A Lot Help needed moving to and from a bed to a chair (including a wheelchair)?: A Little Help needed standing up from a chair using your arms (e.g., wheelchair or bedside chair)?: A Lot Help needed to walk in hospital room?: A Lot Help needed climbing 3-5 steps with a railing? : A Lot 6 Click Score: 13    End of Session Equipment Utilized During Treatment: Gait belt Activity Tolerance: Patient tolerated treatment well Patient left: in chair;with call bell/phone within reach;with chair alarm set Nurse  Communication: Mobility status PT Visit Diagnosis: Unsteadiness on feet (R26.81);Other abnormalities of gait and mobility (R26.89);Muscle weakness (generalized) (M62.81);History of falling (Z91.81)    Time: 8845-8792 PT Time Calculation (min) (ACUTE ONLY): 13 min   Charges:   PT Evaluation $PT Eval Moderate Complexity: 1 Mod   PT General Charges $$ ACUTE PT VISIT: 1 Visit        Delesha Pohlman M. Fairly IV, PT, DPT Physical Therapist- [place]  [facility] 12/30/2023, 2:46 PM

## 2023-12-30 NOTE — Evaluation (Signed)
 Occupational Therapy Evaluation Patient Details Name: Lynn Norton MRN: [id number] DOB: 1950/03/23 Today's Date: 12/30/2023   History of Present Illness   73 y.o. female who was brought in from nursing home for right rib cage pain after a fall at the nursing home. CT angio chest negative for PE and subacute nondisplaced sternal fracture. Current MD assessment: COPD exacerbation, frequent falls with repeated rib fxs and acute mildly displaced right lateral 6th and 7th rib fractures and nondisplaced anterior 5th rib fracture. PMH of multiple falls with rib fracture in the past, HTN, CKD, rheumatoid arthritis, tobacco abuse     Clinical Impressions Pt was seen for OT/PT co-evaluation this date to maximize pt/therapist safety. She is a poor historian, chart states she is from [facility]. Pt presents with deficits in strength, balance, pain management and safety awareness, affecting safe and optimal ADL completion. Pt currently requires Min A via HHA for bed mobility. Min A X1-2 via HHA x1-2, pt reaching for bed rail and fairly unsteady. Utilized BSC for toileting tasks, Max A for clothing management, SBA for seated peri-care. Pt on 2.5L 02 via [place] and maintained sp02 >90% throughout session. Anticipate pt not far from her baseline and could return to facility with follow up therapy services. Pt would benefit from skilled OT services to address noted impairments and functional limitations to maximize safety and independence while minimizing future risk of falls, injury, and readmission. Do anticipate the need for follow up OT services upon acute hospital DC.      If plan is discharge home, recommend the following:   A little help with walking and/or transfers;A little help with bathing/dressing/bathroom;Direct supervision/assist for financial management;Supervision due to cognitive status;Direct supervision/assist for medications management     Functional Status Assessment   Patient  has had a recent decline in their functional status and demonstrates the ability to make significant improvements in function in a reasonable and predictable amount of time.     Equipment Recommendations   Other (comment)     Recommendations for Other Services         Precautions/Restrictions   Precautions Precautions: Fall Recall of Precautions/Restrictions: Impaired Restrictions Weight Bearing Restrictions Per Provider Order: No     Mobility Bed Mobility Overal bed mobility: Needs Assistance Bed Mobility: Supine to Sit     Supine to sit: Contact guard, Min assist     General bed mobility comments: reached for therapists hands to pull self up and forward to EOB    Transfers Overall transfer level: Needs assistance Equipment used: 2 person hand held assist Transfers: Sit to/from Stand Sit to Stand: Min assist, +2 safety/equipment           General transfer comment: HHA x2 to stand from EOB and ambulate to [facility] and back      Balance Overall balance assessment: Needs assistance Sitting-balance support: Feet supported Sitting balance-Leahy Scale: Fair     Standing balance support: Single extremity supported, Bilateral upper extremity supported, During functional activity Standing balance-Leahy Scale: Poor Standing balance comment: +1-2 HHA to maintain balance                           ADL either performed or assessed with clinical judgement   ADL Overall ADL's : Needs assistance/impaired                         Toilet Transfer: BSC/3in1;Minimal assistance;+2 for safety/equipment Statistician  Details (indicate cue type and reason): HHA x2 to ambulate to [facility] and back to recliner Toileting- Clothing Manipulation and Hygiene: Supervision/safety;Sitting/lateral lean Toileting - Clothing Manipulation Details (indicate cue type and reason): seated peri-care     Functional mobility during ADLs: Contact guard assist;Cueing for  safety;Minimal assistance;+2 for safety/equipment       Vision         Perception         Praxis         Pertinent Vitals/Pain Pain Assessment Pain Assessment: Faces Faces Pain Scale: Hurts a little bit Pain Location: ribs Pain Descriptors / Indicators: Sore Pain Intervention(s): Monitored during session, Repositioned     Extremity/Trunk Assessment Upper Extremity Assessment Upper Extremity Assessment: Generalized weakness   Lower Extremity Assessment Lower Extremity Assessment: Generalized weakness       Communication Communication Communication: No apparent difficulties   Cognition Arousal: Alert Behavior During Therapy: WFL for tasks assessed/performed Cognition: Cognition impaired   Orientation impairments: Time, Place                           Following commands: Impaired Following commands impaired: Follows one step commands with increased time     Cueing  General Comments   Cueing Techniques: Verbal cues  sp02 >90% on 2.5L during activity   Exercises     Shoulder Instructions      Home Living Family/patient expects to be discharged to:: Assisted living Living Arrangements: Other (Comment)                               Additional Comments: No family/caregivers present to verify information provided.      Prior Functioning/Environment Prior Level of Function : Patient poor historian/Family not available;History of Falls (last six months)               ADLs Comments: pt reports living alone, but per chart she lives at [facility]    OT Problem List: Decreased strength;Decreased activity tolerance;Impaired balance (sitting and/or standing);Decreased safety awareness;Pain   OT Treatment/Interventions: Self-care/ADL training;Therapeutic exercise;Balance training;Therapeutic activities;DME and/or AE instruction;Patient/family education      OT Goals(Current goals can be found in the care plan section)    Acute Rehab OT Goals OT Goal Formulation: Patient unable to participate in goal setting Time For Goal Achievement: 01/13/24 Potential to Achieve Goals: Fair ADL Goals Pt Will Perform Lower Body Bathing: with contact guard assist;sitting/lateral leans;sit to/from stand Pt Will Perform Lower Body Dressing: with contact guard assist;sitting/lateral leans;sit to/from stand Pt Will Transfer to Toilet: with supervision;with contact guard assist;ambulating   OT Frequency:  Min 2X/week    Co-evaluation PT/OT/SLP Co-Evaluation/Treatment: Yes Reason for Co-Treatment: For patient/therapist safety PT goals addressed during session: Mobility/safety with mobility OT goals addressed during session: ADL's and self-care      AM-PAC OT 6 Clicks Daily Activity     Outcome Measure Help from another person eating meals?: A Little Help from another person taking care of personal grooming?: A Little Help from another person toileting, which includes using toliet, bedpan, or urinal?: A Lot Help from another person bathing (including washing, rinsing, drying)?: A Lot Help from another person to put on and taking off regular upper body clothing?: A Little Help from another person to put on and taking off regular lower body clothing?: A Lot 6 Click Score: 15   End of Session Equipment Utilized During  Treatment: Gait belt Nurse Communication: Mobility status  Activity Tolerance: Patient tolerated treatment well Patient left: in chair;with call bell/phone within reach;with chair alarm set  OT Visit Diagnosis: Other abnormalities of gait and mobility (R26.89);Muscle weakness (generalized) (M62.81);Unsteadiness on feet (R26.81)                Time: 8846-8787 OT Time Calculation (min): 19 min Charges:  OT General Charges $OT Visit: 1 Visit OT Evaluation $OT Eval Moderate Complexity: 1 Mod Allaya Abbasi, OTR/L 12/30/23, 1:19 PM  Eivan Gallina E Wacey Zieger 12/30/2023, 1:12 PM

## 2023-12-30 NOTE — ED Notes (Signed)
 Pt O2 saturation low 80's. Placed on 4LPM by nasal cannula

## 2023-12-30 NOTE — ED Notes (Signed)
 Provided patient with incentive spirometer and demonstrated use; educated on importance of using twice every two hours. Patient verbalized understanding.

## 2023-12-30 NOTE — ED Triage Notes (Signed)
 Pt presents via EMS from the [facility] c/o right sided ribcage pain. EMS reports pt has known right sided ribcage fracture. Pain on palpation to right sided ribcage.

## 2023-12-31 DIAGNOSIS — J9621 Acute and chronic respiratory failure with hypoxia: Secondary | ICD-10-CM | POA: Diagnosis present

## 2023-12-31 DIAGNOSIS — J441 Chronic obstructive pulmonary disease with (acute) exacerbation: Secondary | ICD-10-CM | POA: Diagnosis present

## 2023-12-31 DIAGNOSIS — Z1152 Encounter for screening for COVID-19: Secondary | ICD-10-CM | POA: Diagnosis not present

## 2023-12-31 DIAGNOSIS — S2220XA Unspecified fracture of sternum, initial encounter for closed fracture: Secondary | ICD-10-CM | POA: Diagnosis present

## 2023-12-31 DIAGNOSIS — R296 Repeated falls: Secondary | ICD-10-CM | POA: Diagnosis present

## 2023-12-31 DIAGNOSIS — Z9181 History of falling: Secondary | ICD-10-CM | POA: Diagnosis not present

## 2023-12-31 DIAGNOSIS — I745 Embolism and thrombosis of iliac artery: Secondary | ICD-10-CM | POA: Diagnosis present

## 2023-12-31 DIAGNOSIS — Z881 Allergy status to other antibiotic agents status: Secondary | ICD-10-CM | POA: Diagnosis not present

## 2023-12-31 DIAGNOSIS — R2689 Other abnormalities of gait and mobility: Secondary | ICD-10-CM | POA: Diagnosis present

## 2023-12-31 DIAGNOSIS — Z79899 Other long term (current) drug therapy: Secondary | ICD-10-CM | POA: Diagnosis not present

## 2023-12-31 DIAGNOSIS — Z681 Body mass index (BMI) 19 or less, adult: Secondary | ICD-10-CM | POA: Diagnosis not present

## 2023-12-31 DIAGNOSIS — R636 Underweight: Secondary | ICD-10-CM | POA: Diagnosis present

## 2023-12-31 DIAGNOSIS — I129 Hypertensive chronic kidney disease with stage 1 through stage 4 chronic kidney disease, or unspecified chronic kidney disease: Secondary | ICD-10-CM | POA: Diagnosis present

## 2023-12-31 DIAGNOSIS — W19XXXA Unspecified fall, initial encounter: Secondary | ICD-10-CM | POA: Diagnosis present

## 2023-12-31 DIAGNOSIS — Z8249 Family history of ischemic heart disease and other diseases of the circulatory system: Secondary | ICD-10-CM | POA: Diagnosis not present

## 2023-12-31 DIAGNOSIS — Z66 Do not resuscitate: Secondary | ICD-10-CM | POA: Diagnosis present

## 2023-12-31 DIAGNOSIS — J439 Emphysema, unspecified: Secondary | ICD-10-CM | POA: Diagnosis present

## 2023-12-31 DIAGNOSIS — K219 Gastro-esophageal reflux disease without esophagitis: Secondary | ICD-10-CM | POA: Diagnosis present

## 2023-12-31 DIAGNOSIS — Z720 Tobacco use: Secondary | ICD-10-CM | POA: Diagnosis not present

## 2023-12-31 DIAGNOSIS — Z888 Allergy status to other drugs, medicaments and biological substances status: Secondary | ICD-10-CM | POA: Diagnosis not present

## 2023-12-31 DIAGNOSIS — Z880 Allergy status to penicillin: Secondary | ICD-10-CM | POA: Diagnosis not present

## 2023-12-31 DIAGNOSIS — S2243XA Multiple fractures of ribs, bilateral, initial encounter for closed fracture: Secondary | ICD-10-CM | POA: Diagnosis present

## 2023-12-31 DIAGNOSIS — Z882 Allergy status to sulfonamides status: Secondary | ICD-10-CM | POA: Diagnosis not present

## 2023-12-31 DIAGNOSIS — M069 Rheumatoid arthritis, unspecified: Secondary | ICD-10-CM | POA: Diagnosis present

## 2023-12-31 DIAGNOSIS — Y92129 Unspecified place in nursing home as the place of occurrence of the external cause: Secondary | ICD-10-CM | POA: Diagnosis not present

## 2023-12-31 DIAGNOSIS — N182 Chronic kidney disease, stage 2 (mild): Secondary | ICD-10-CM | POA: Diagnosis present

## 2023-12-31 LAB — BASIC METABOLIC PANEL WITH GFR
Anion gap: 12 (ref 5–15)
BUN: 31 mg/dL — ABNORMAL HIGH (ref 8–23)
CO2: 26 mmol/L (ref 22–32)
Calcium: 8.8 mg/dL — ABNORMAL LOW (ref 8.9–10.3)
Chloride: 98 mmol/L (ref 98–111)
Creatinine, Ser: 0.91 mg/dL (ref 0.44–1.00)
GFR, Estimated: >60 mL/min (ref >60)
Glucose, Bld: 117 mg/dL — ABNORMAL HIGH (ref 70–99)
Potassium: 5 mmol/L (ref 3.5–5.1)
Sodium: 136 mmol/L (ref 135–145)

## 2023-12-31 LAB — CBC
HCT: 34.5 % — ABNORMAL LOW (ref 36.0–46.0)
Hemoglobin: 11.3 g/dL — ABNORMAL LOW (ref 12.0–15.0)
MCH: 33.6 pg (ref 26.0–34.0)
MCHC: 32.8 g/dL (ref 30.0–36.0)
MCV: 102.7 fL — ABNORMAL HIGH (ref 80.0–100.0)
Platelets: 248 K/uL (ref 150–400)
RBC: 3.36 MIL/uL — ABNORMAL LOW (ref 3.87–5.11)
RDW: 15.7 % — ABNORMAL HIGH (ref 11.5–15.5)
WBC: 13.1 K/uL — ABNORMAL HIGH (ref 4.0–10.5)
nRBC: 0 % (ref 0.0–0.2)

## 2023-12-31 LAB — PROTIME-INR
INR: 1 (ref 0.8–1.2)
Prothrombin Time: 13.3 s (ref 11.4–15.2)

## 2023-12-31 MED ORDER — AMLODIPINE BESYLATE 10 MG PO TABS
10.0000 mg | ORAL_TABLET | Freq: Every day | ORAL | Status: DC
  Administered 2023-12-31 – 2024-01-03 (×4): 10 mg via ORAL
  Filled 2023-12-31 (×4): qty 1

## 2023-12-31 NOTE — Progress Notes (Signed)
 Mobility Specialist - Progress Note     12/31/23 1149  Mobility  Activity Ambulated with assistance;Stood at bedside;Dangled on edge of bed;Pivoted/transferred to/from [facility]  Level of Assistance Contact guard assist, steadying assist  Assistive Device Front wheel walker  Distance Ambulated (ft) 7 ft  Range of Motion/Exercises Active  Activity Response Tolerated well  Mobility Referral Yes  Mobility visit 1 Mobility   Pt resting in bed on 2.5L upon entry. Pt STS and ambulates in room to [facility] CGA at end of bed. Pt returned to recliner and left with needs in reach. Pt chair alarm activated.   Guido Rumble Mobility Specialist 12/31/23, 12:16 PM

## 2023-12-31 NOTE — Progress Notes (Signed)
 PROGRESS NOTE    Lynn Norton  FMW:[id number] DOB: [birth year] DOA: 12/30/2023 PCP: Pcp, No    Assessment & Plan:   Principal Problem:   COPD exacerbation (HCC) Active Problems:   Closed rib fracture   Rheumatoid arthritis (HCC)   Tobacco use   Gait instability   Falls, subsequent encounter  Assessment and Plan: Frequent falls & generalized weakness: PT recs SNF. Pt is currently refusing SNF  Rib fractures: of right side 5-7. Likely secondary to frequent falls. Encourage incentive spirometry. Oxy, morphine  prn for pain    Hypoxemia: etiology unclear. Saturating in 90s on RA currently. Encourage incentive spirometry. Resolved   COPD exacerbation: continue on IV steroids, azithromycin, bronchodilators & encourage incentive spirometry    Rheumatoid arthritis: continue on home dose of methotrexate   HTN: restart home dose of amlodipine     Chronic tobacco abuse: nicotine  patch to prevent w/drawl.    Hx of malnutrition: continue on po nutritional supplements    GERD: continue on PPI       DVT prophylaxis: lovenox   Code Status: DNR Family Communication: pt does not want me to call any friends and/or family  Disposition Plan: PT recs SNF  Level of care: Telemetry  Status is: Inpatient Remains inpatient appropriate because: severity of illness, still w/ a lot of rib pain     Consultants:    Procedures:   Antimicrobials: azithromycin    Subjective: Pt c/o rib pain   Objective: Vitals:   12/30/23 1645 12/30/23 1942 12/31/23 0007 12/31/23 0825  BP: (!) 120/58 122/74 (!) 150/78 (!) 153/70  Pulse: 76 73 71 74  Resp: 14 15  18   Temp: 97.7 F (36.5 C) (!) 97.4 F (36.3 C) 98 F (36.7 C) 97.9 F (36.6 C)  TempSrc:    Oral  SpO2: 99% 99% 96% 97%  Weight:      Height:        Intake/Output Summary (Last 24 hours) at 12/31/2023 0857 Last data filed at 12/30/2023 1700 Gross per 24 hour  Intake 430.18 ml  Output --  Net 430.18 ml   Filed Weights    12/30/23 1152  Weight: 39.5 kg    Examination:  General exam: Appears calm and comfortable  Respiratory system: diminished breath sounds b/l  Cardiovascular system: S1 & S2+. No rubs, gallops or clicks.  Gastrointestinal system: Abdomen is nondistended, soft and nontender. Normal bowel sounds heard. Central nervous system: Alert and alert. Moves all extremities  Psychiatry: Judgement and insight appears at baseline. Flat mood and affect    Data Reviewed: I have personally reviewed following labs and imaging studies  CBC: Recent Labs  Lab 12/30/23 0524 12/31/23 0809  WBC 10.5 13.1*  NEUTROABS 7.3  --   HGB 12.4 11.3*  HCT 38.3 34.5*  MCV 104.6* 102.7*  PLT 307 248   Basic Metabolic Panel: Recent Labs  Lab 12/30/23 0524 12/31/23 0809  NA 141 136  K 4.1 5.0  CL 101 98  CO2 28 26  GLUCOSE 103* 117*  BUN 23 31*  CREATININE 0.91 0.91  CALCIUM 8.9 8.8*   GFR: Estimated Creatinine Clearance: 33.3 mL/min (by C-G formula based on SCr of 0.91 mg/dL). Liver Function Tests: Recent Labs  Lab 12/30/23 0524  AST 27  ALT 20  ALKPHOS 63  BILITOT 0.7  PROT 7.0  ALBUMIN 3.9   Recent Labs  Lab 12/30/23 0524  LIPASE 35   No results for input(s): AMMONIA in the last 168 hours. Coagulation Profile: Recent Labs  Lab  12/31/23 0809  INR 1.0   Cardiac Enzymes: No results for input(s): CKTOTAL, CKMB, CKMBINDEX, TROPONINI in the last 168 hours. BNP (last 3 results) No results for input(s): PROBNP in the last 8760 hours. HbA1C: No results for input(s): HGBA1C in the last 72 hours. CBG: No results for input(s): GLUCAP in the last 168 hours. Lipid Profile: No results for input(s): CHOL, HDL, LDLCALC, TRIG, CHOLHDL, LDLDIRECT in the last 72 hours. Thyroid Function Tests: No results for input(s): TSH, T4TOTAL, FREET4, T3FREE, THYROIDAB in the last 72 hours. Anemia Panel: No results for input(s): VITAMINB12, FOLATE, FERRITIN,  TIBC, IRON, RETICCTPCT in the last 72 hours. Sepsis Labs: No results for input(s): PROCALCITON, LATICACIDVEN in the last 168 hours.  Recent Results (from the past 240 hours)  Resp panel by RT-PCR (RSV, Flu A&B, Covid) Anterior Nasal Swab     Status: None   Collection Time: 12/30/23  5:24 AM   Specimen: Anterior Nasal Swab  Result Value Ref Range Status   SARS Coronavirus 2 by RT PCR NEGATIVE NEGATIVE Final    Comment: (NOTE) SARS-CoV-2 target nucleic acids are NOT DETECTED.  The SARS-CoV-2 RNA is generally detectable in upper respiratory specimens during the acute phase of infection. The lowest concentration of SARS-CoV-2 viral copies this assay can detect is 138 copies/mL. A negative result does not preclude SARS-Cov-2 infection and should not be used as the sole basis for treatment or other patient management decisions. A negative result may occur with  improper specimen collection/handling, submission of specimen other than nasopharyngeal swab, presence of viral mutation(s) within the areas targeted by this assay, and inadequate number of viral copies(<138 copies/mL). A negative result must be combined with clinical observations, patient history, and epidemiological information. The expected result is Negative.  Fact Sheet for Patients:  bloggercourse.com  Fact Sheet for Healthcare Providers:  seriousbroker.it  This test is no t yet approved or cleared by the [place]  FDA and  has been authorized for detection and/or diagnosis of SARS-CoV-2 by FDA under an Emergency Use Authorization (EUA). This EUA will remain  in effect (meaning this test can be used) for the duration of the COVID-19 declaration under Section 564(b)(1) of the Act, 21 U.S.C.section 360bbb-3(b)(1), unless the authorization is terminated  or revoked sooner.       Influenza A by PCR NEGATIVE NEGATIVE Final   Influenza B by PCR NEGATIVE  NEGATIVE Final    Comment: (NOTE) The Xpert Xpress SARS-CoV-2/FLU/RSV plus assay is intended as an aid in the diagnosis of influenza from Nasopharyngeal swab specimens and should not be used as a sole basis for treatment. Nasal washings and aspirates are unacceptable for Xpert Xpress SARS-CoV-2/FLU/RSV testing.  Fact Sheet for Patients: bloggercourse.com  Fact Sheet for Healthcare Providers: seriousbroker.it  This test is not yet approved or cleared by the [place]  FDA and has been authorized for detection and/or diagnosis of SARS-CoV-2 by FDA under an Emergency Use Authorization (EUA). This EUA will remain in effect (meaning this test can be used) for the duration of the COVID-19 declaration under Section 564(b)(1) of the Act, 21 U.S.C. section 360bbb-3(b)(1), unless the authorization is terminated or revoked.     Resp Syncytial Virus by PCR NEGATIVE NEGATIVE Final    Comment: (NOTE) Fact Sheet for Patients: bloggercourse.com  Fact Sheet for Healthcare Providers: seriousbroker.it  This test is not yet approved or cleared by the [place]  FDA and has been authorized for detection and/or diagnosis of SARS-CoV-2 by FDA under an Emergency Use Authorization (EUA).  This EUA will remain in effect (meaning this test can be used) for the duration of the COVID-19 declaration under Section 564(b)(1) of the Act, 21 U.S.C. section 360bbb-3(b)(1), unless the authorization is terminated or revoked.  Performed at [facility], [address] [place], [place] [address]   MRSA Next Gen by PCR, Nasal     Status: None   Collection Time: 12/30/23 11:23 AM   Specimen: Nasal Mucosa; Nasal Swab  Result Value Ref Range Status   MRSA by PCR Next Gen NOT DETECTED NOT DETECTED Final    Comment: (NOTE) The GeneXpert MRSA Assay (FDA approved for NASAL specimens only), is one  component of a comprehensive MRSA colonization surveillance program. It is not intended to diagnose MRSA infection nor to guide or monitor treatment for MRSA infections. Test performance is not FDA approved in patients less than 58 years old. Performed at [facility], [address], [place], [place] [address]          Radiology Studies: CT ABDOMEN PELVIS W CONTRAST Result Date: 12/30/2023 EXAM: CT ABDOMEN AND PELVIS WITH CONTRAST 12/30/2023 06:27:48 AM TECHNIQUE: CT of the abdomen and pelvis was performed with the administration of 75 mL iohexol (OMNIPAQUE) 350 MG/ML injection. Multiplanar reformatted images are provided for review. Automated exposure control, iterative reconstruction, and/or weight-based adjustment of the mA/kV was utilized to reduce the radiation dose to as low as reasonably achievable. COMPARISON: CTA chest 12/30/2023 reported separately. Non-contrast CT abdomen and pelvis 06/13/2021. CLINICAL HISTORY: 73 year old female. Recent fall, has right upper quadrant abdominal pain on exam. FINDINGS: LOWER CHEST: CTA chest is reported separately today. LIVER: Liver enhancement remains within normal limits. GALLBLADDER AND BILE DUCTS: Evidence of a 6 mm gallstone in the neck of the gallbladder, but the gallbladder is nondistended, no regional inflammation. No biliary ductal dilatation. SPLEEN: No acute abnormality. PANCREAS: No acute abnormality. ADRENAL GLANDS: No acute abnormality. KIDNEYS, URETERS AND BLADDER: Symmetric renal enhancement and contrast excretion. Normal ureters. No stones in the kidneys or ureters. No hydronephrosis. No perinephric or periureteral stranding. Diminutive, unremarkable bladder. GI AND BOWEL: Redundant large bowel with retained stool. Sigmoid diverticulosis; no active large bowel inflammation identified. Diminutive or absent appendix. Nondilated small bowel and stomach. There is no bowel obstruction. PERITONEUM AND RETROPERITONEUM: No ascites. No  free air. No pelvis free fluid. VASCULATURE: Bulky calcified aortoiliac atherosclerosis. Tortuous abdominal aorta. Occluded left common iliac artery on series 2 image 39, reconstituted left iliac artery by bifurcation on series 2 image 50. Additional bilateral iliac and bilateral proximal femoral artery atherosclerosis, but the other major arterial structures remain patent. Portal venous system appears to be patent. LYMPH NODES: No lymphadenopathy. REPRODUCTIVE ORGANS: No acute abnormality. BONES AND SOFT TISSUES: Advanced lumbar spine degeneration with evidence of multilevel developing degenerative interbody ankylosis (most notably L4-L5). Maintained lumbar vertebral height. Underlying moderate chronic dextroconvex lumbar scoliosis. Chronic pubic symphysis and bilateral hip degeneration. Chronic right sacral ala fracture with sclerosis is stable. Interval healing of left acetabular and inferior pubic ramus fractures. No focal soft tissue abnormality. IMPRESSION: 1. Bulky calcified aortoiliac atherosclerosis with occluded left common iliac artery but distal reconstitution. Other major arterial structures remain patent. 2. No acute or inflammatory process identified in the abdomen or pelvis. 3. Cholelithiasis. 4. CTA Chest reported separately. Electronically signed by: Helayne Hurst MD 12/30/2023 06:53 AM EST RP Workstation: [id number]   CT Angio Chest PE W and/or Wo Contrast Result Date: 12/30/2023 EXAM: CTA CHEST 12/30/2023 06:27:48 AM TECHNIQUE: CTA of the chest  was performed without and with the administration of 75 mL of intravenous contrast (iohexol (OMNIPAQUE) 350 MG/ML injection 75 mL IOHEXOL 350 MG/ML SOLN). Multiplanar reformatted images are provided for review. MIP images are provided for review. Automated exposure control, iterative reconstruction, and/or weight based adjustment of the mA/kV was utilized to reduce the radiation dose to as low as reasonably achievable. COMPARISON: Chest CT without  contrast 11/10/2023. CT abdomen and pelvis today reported separately. CLINICAL HISTORY: 73 year old female with tachycardia, tachypnea, recent fall and rib injury, worsening pain, increased work of breathing, and rhonchorous breath sounds. FINDINGS: PULMONARY ARTERIES: Pulmonary arteries are adequately opacified for evaluation. Good pulmonary artery contrast timing. Marked central pulmonary artery enlargement nearly 4 cm diameter (series 6 image 204). No pulmonary artery filling defect. Respiratory motion artifact at the basal segments of both lungs. MEDIASTINUM: Heart size at the upper limits of normal. No pericardial effusion. Superimposed dilated / ectatic thoracic aorta also no thoracic aortic aneurysm or dissection. Advanced calcified thoracic aortic atherosclerosis. LYMPH NODES: No mediastinal, hilar or axillary lymphadenopathy. LUNGS AND PLEURA: Centrilobular emphysema is diffuse. Mild to moderate retained secretions in the trachea carina. Major airways remain patent. There is chronic bronchiectasis and atelectasis in the medial segment of the right middle lobe, unchanged. No new pulmonary abnormality. No evidence of pleural effusion or pneumothorax. UPPER ABDOMEN: Dedicated CT abdomen and pelvis today reported separately. SOFT TISSUES AND BONES: Partially visible right shoulder arthroplasty with streak artifact. Healing left anterior rib fracture such as the anterior left third rib on series 5 image 56. Healing left posterior 11th and lateral 10th left rib fractures which were present in september. Lesser healing of posterior left 12th rib fracture, also present in september. Healing right posterior 12th rib fracture and lateral right 9th, 10th, and 11th rib fractures which were present in september. New and acute appearing mildly displaced right lateral 6th and 7th rib fractures. Nondisplaced right anterior 5th rib fracture is age indeterminate. Subacute appearing nondisplaced sternal fracture with some  healing sagittal image 71. This appears new since september. Stable thoracic vertebral height and alignment, no significant thoracic compression. IMPRESSION: 1. No acute pulmonary embolism identified. Chronic central pulmonary artery enlargement suggesting pulmonary hypertension. Emphysema. 2. Acute mildly displaced right lateral 6th and 7th rib fractures, nondisplaced anterior 5th rib fracture. Subacute nondisplaced sternal fracture. 3. Numerous additional healing bilateral rib fractures which were present in September. Electronically signed by: Helayne Hurst MD 12/30/2023 06:48 AM EST RP Workstation: [id number]   CT Cervical Spine Wo Contrast Result Date: 12/30/2023 EXAM: CT CERVICAL SPINE WITHOUT CONTRAST 12/30/2023 06:27:48 AM TECHNIQUE: CT of the cervical spine was performed without the administration of intravenous contrast. Multiplanar reformatted images are provided for review. Automated exposure control, iterative reconstruction, and/or weight based adjustment of the mA/kV was utilized to reduce the radiation dose to as low as reasonably achievable. COMPARISON: CT Cervical Spine 11/11/2023. Head CT reported separately today. CLINICAL HISTORY: 73 year old female with neck trauma, pain, and rib fractures. FINDINGS: CERVICAL SPINE: BONES AND ALIGNMENT: Chronic straightening and mild reversal of the normal cervical lordosis. Calcified ligamentous hypertrophy about the odontoid. No acute fracture or traumatic malalignment. DEGENERATIVE CHANGES: Chronic severe cervical disc and endplate degeneration with evidence of developing degenerative interbody ankylosis from C4-C5 through the cervicothoracic junction. No significant cervical spinal stenosis by CT. SOFT TISSUES: Streak artifact in the non-contrast neck soft tissues. Calcified cervical carotid atherosclerosis. Negative visible non-contrast thoracic inlet. No prevertebral soft tissue swelling. IMPRESSION: 1. No acute traumatic injury identified in the  cervical spine. 2. Chronic severe cervical spondylosis with developing interbody ankylosis. Electronically signed by: Helayne Hurst MD 12/30/2023 06:39 AM EST RP Workstation: [id number]   CT HEAD WO CONTRAST ( ) Result Date: 12/30/2023 EXAM: CT HEAD WITHOUT CONTRAST 12/30/2023 06:27:48 AM TECHNIQUE: CT of the head was performed without the administration of intravenous contrast. Automated exposure control, iterative reconstruction, and/or weight based adjustment of the mA/kV was utilized to reduce the radiation dose to as low as reasonably achievable. COMPARISON: Brain MRI 10/10/2023. Head CT 11/11/2023. CLINICAL HISTORY: 73 year old female with minor head trauma, pain, and rib fractures. FINDINGS: BRAIN AND VENTRICLES: No acute hemorrhage. No evidence of acute infarct. Stable brain volume. Ex vacuo appearing ventricular enlargement. Confluent bilateral cerebral white matter hypodensity is stable. Chronic dural calcification. No extra-axial collection. No mass effect or midline shift. Calcified atherosclerosis of the skull base. No suspicious intracranial vascular hyperdensity. ORBITS: No acute abnormality. SINUSES: No acute abnormality. SOFT TISSUES AND SKULL: No acute soft tissue abnormality. No skull fracture. IMPRESSION: 1. No acute intracranial abnormality  or acute traumatic injury identified. 2. Stable brain volume loss and advanced cerebral white matter disease. Electronically signed by: Helayne Hurst MD 12/30/2023 06:37 AM EST RP Workstation: [id number]        Scheduled Meds:  azithromycin  500 mg Oral Daily   docusate sodium   100 mg Oral BID   enoxaparin  (LOVENOX ) injection  40 mg Subcutaneous Q24H   feeding supplement  237 mL Oral BID BM   folic acid   1 mg Oral Daily   [START ON 01/05/2024] methotrexate   20 mg Oral Q Thu   methylPREDNISolone  (SOLU-MEDROL ) injection  40 mg Intravenous Q12H   nicotine   21 mg Transdermal Daily   pantoprazole  (PROTONIX ) IV  40 mg Intravenous Q12H    PARoxetine   10 mg Oral Daily   sodium chloride   1 g Oral BID WC   Continuous Infusions:   LOS: 0 days      Anthony CHRISTELLA Pouch, MD [facility] Pager [phone]  If 7PM-7AM, please contact night-coverage [email or web address] 12/31/2023, 8:57 AM

## 2023-12-31 NOTE — Plan of Care (Signed)
  Problem: Safety: Goal: Ability to remain free from injury will improve Outcome: Progressing   Problem: Coping: Goal: Level of anxiety will decrease Outcome: Progressing   Problem: Nutrition: Goal: Adequate nutrition will be maintained Outcome: Progressing   Problem: Clinical Measurements: Goal: Ability to maintain clinical measurements within normal limits will improve Outcome: Progressing Goal: Respiratory complications will improve Outcome: Progressing

## 2024-01-01 DIAGNOSIS — J441 Chronic obstructive pulmonary disease with (acute) exacerbation: Secondary | ICD-10-CM | POA: Diagnosis not present

## 2024-01-01 LAB — BASIC METABOLIC PANEL WITH GFR
Anion gap: 9 (ref 5–15)
BUN: 42 mg/dL — ABNORMAL HIGH (ref 8–23)
CO2: 30 mmol/L (ref 22–32)
Calcium: 8.9 mg/dL (ref 8.9–10.3)
Chloride: 96 mmol/L — ABNORMAL LOW (ref 98–111)
Creatinine, Ser: 0.83 mg/dL (ref 0.44–1.00)
GFR, Estimated: >60 mL/min (ref >60)
Glucose, Bld: 94 mg/dL (ref 70–99)
Potassium: 5.1 mmol/L (ref 3.5–5.1)
Sodium: 135 mmol/L (ref 135–145)

## 2024-01-01 LAB — CBC
HCT: 33.8 % — ABNORMAL LOW (ref 36.0–46.0)
Hemoglobin: 11.3 g/dL — ABNORMAL LOW (ref 12.0–15.0)
MCH: 33.7 pg (ref 26.0–34.0)
MCHC: 33.4 g/dL (ref 30.0–36.0)
MCV: 100.9 fL — ABNORMAL HIGH (ref 80.0–100.0)
Platelets: 291 K/uL (ref 150–400)
RBC: 3.35 MIL/uL — ABNORMAL LOW (ref 3.87–5.11)
RDW: 15.7 % — ABNORMAL HIGH (ref 11.5–15.5)
WBC: 12.7 K/uL — ABNORMAL HIGH (ref 4.0–10.5)
nRBC: 0 % (ref 0.0–0.2)

## 2024-01-01 MED ORDER — GUAIFENESIN ER 600 MG PO TB12
600.0000 mg | ORAL_TABLET | Freq: Two times a day (BID) | ORAL | Status: DC
  Administered 2024-01-01 – 2024-01-03 (×6): 600 mg via ORAL
  Filled 2024-01-01 (×6): qty 1

## 2024-01-01 MED ORDER — METHYLPREDNISOLONE SODIUM SUCC 40 MG IJ SOLR
40.0000 mg | INTRAMUSCULAR | Status: DC
  Administered 2024-01-02 – 2024-01-03 (×2): 40 mg via INTRAVENOUS
  Filled 2024-01-01 (×2): qty 1

## 2024-01-01 NOTE — Progress Notes (Addendum)
 PROGRESS NOTE   HPI was taken from Dr. Roann:  Lynn Norton is a pleasant 73 y.o. female with medical history significant for multiple falls with rib fracture in the past, HTN, CKD, rheumatoid arthritis, tobacco abuse who was brought in from nursing home for right rib cage pain after a fall at the nursing home.  Patient stated that she had multiple falls in the past and had rib fractures.  She stated that she had a fall at the nursing home on December 24, 2023 since then she had right rib cage pain.  She was tachypneic and wheezing.  She does not have a history of DVT or PE.  She does not wear oxygen at the nursing home.  She denies any fevers, chills, cough, shortness of breath, nausea, vomiting, urinary discomforts.  She is not a great historian.   ED Course: Upon arrival to the ED, patient is found to be in sinus rhythm with ventricular rate of 74 on EKG, no ST elevations, CT chest showed right-sided 3 acute rib fracture, no pneumothorax no PE, CT cervical spine showed no spine fracture, CT head no acute pathology.  Patient was given morphine , oxygen, nebulization, Solu-Medrol  and hospitalist service was consulted for evaluation for admission.     Dan Dissinger  FMW:[id number] DOB: 1950-08-01 DOA: 12/30/2023 PCP: Pcp, No    Assessment & Plan:   Principal Problem:   COPD exacerbation (HCC) Active Problems:   Closed rib fracture   Rheumatoid arthritis (HCC)   Tobacco use   Gait instability   Falls, subsequent encounter  Assessment and Plan: Frequent falls & generalized weakness: PT recs SNF. Pt is currently refusing SNF  Rib fractures: of right side 5-7. Likely secondary to frequent falls. Encourage incentive spirometry. Oxy, morphine  prn   Sternal fracture: subacute & nondisplaced as per CTA. Continue w/ supportive care. Oxy, morphine  prn for pain    Hypoxemia: etiology unclear. Saturating in 90s on RA currently. Encourage incentive spirometry. Resolved   COPD exacerbation:  completed azithromycin course. Started steroid taper, continue on bronchodilators & encourage incentive spirometry    Rheumatoid arthritis: continue on home dose of methotrexate    HTN: continue on home dose of amlodipine     Chronic tobacco abuse: nicotine  patch to prevent w/drawl.    Hx of malnutrition: continue on po nutritional supplements    GERD: continue on PPI       DVT prophylaxis: lovenox   Code Status: DNR Family Communication: pt does not want me to call any friends and/or family  Disposition Plan: PT recs SNF  Level of care: Telemetry  Status is: Inpatient Remains inpatient appropriate because: severity of illness, still w/ a lot of rib pain & pain w/ taking deep breaths    Consultants:    Procedures:   Antimicrobials:    Subjective: Pt c/o rib pain still   Objective: Vitals:   12/31/23 1637 12/31/23 1955 01/01/24 0012 01/01/24 0854  BP: (!) 156/72 (!) 153/72 135/66 (!) 169/81  Pulse: 72 71 72 77  Resp: 18 18 17 16   Temp: 98.3 F (36.8 C) 97.8 F (36.6 C) 98.4 F (36.9 C) 98.2 F (36.8 C)  TempSrc: Oral     SpO2: 98% 98% 97% 100%  Weight:      Height:        Intake/Output Summary (Last 24 hours) at 01/01/2024 0859 Last data filed at 12/31/2023 1900 Gross per 24 hour  Intake 540 ml  Output --  Net 540 ml   [employer]  12/30/23 1152  Weight: 39.5 kg    Examination:  General exam: Appears uncomfortable  Respiratory system: decreased breath sounds b/l Cardiovascular system: S1/S2+. No rubs or clicks   Gastrointestinal system: Abd is soft, NT, ND & hypoactive bowel sounds Central nervous system: alert & awake. Moves all extremities   Psychiatry: judgement and insight appears at baseline. Flat mood and affect     Data Reviewed: I have personally reviewed following labs and imaging studies  CBC: Recent Labs  Lab 12/30/23 0524 12/31/23 0809 01/01/24 0527  WBC 10.5 13.1* 12.7*  NEUTROABS 7.3  --   --   HGB 12.4 11.3* 11.3*   HCT 38.3 34.5* 33.8*  MCV 104.6* 102.7* 100.9*  PLT 307 248 291   Basic Metabolic Panel: Recent Labs  Lab 12/30/23 0524 12/31/23 0809 01/01/24 0527  NA 141 136 135  K 4.1 5.0 5.1  CL 101 98 96*  CO2 28 26 30   GLUCOSE 103* 117* 94  BUN 23 31* 42*  CREATININE 0.91 0.91 0.83  CALCIUM 8.9 8.8* 8.9   GFR: Estimated Creatinine Clearance: 36.5 mL/min (by C-G formula based on SCr of 0.83 mg/dL). Liver Function Tests: Recent Labs  Lab 12/30/23 0524  AST 27  ALT 20  ALKPHOS 63  BILITOT 0.7  PROT 7.0  ALBUMIN 3.9   Recent Labs  Lab 12/30/23 0524  LIPASE 35   No results for input(s): AMMONIA in the last 168 hours. Coagulation Profile: Recent Labs  Lab 12/31/23 0809  INR 1.0   Cardiac Enzymes: No results for input(s): CKTOTAL, CKMB, CKMBINDEX, TROPONINI in the last 168 hours. BNP (last 3 results) No results for input(s): PROBNP in the last 8760 hours. HbA1C: No results for input(s): HGBA1C in the last 72 hours. CBG: No results for input(s): GLUCAP in the last 168 hours. Lipid Profile: No results for input(s): CHOL, HDL, LDLCALC, TRIG, CHOLHDL, LDLDIRECT in the last 72 hours. Thyroid Function Tests: No results for input(s): TSH, T4TOTAL, FREET4, T3FREE, THYROIDAB in the last 72 hours. Anemia Panel: No results for input(s): VITAMINB12, FOLATE, FERRITIN, TIBC, IRON, RETICCTPCT in the last 72 hours. Sepsis Labs: No results for input(s): PROCALCITON, LATICACIDVEN in the last 168 hours.  Recent Results (from the past 240 hours)  Resp panel by RT-PCR (RSV, Flu A&B, Covid) Anterior Nasal Swab     Status: None   Collection Time: 12/30/23  5:24 AM   Specimen: Anterior Nasal Swab  Result Value Ref Range Status   SARS Coronavirus 2 by RT PCR NEGATIVE NEGATIVE Final    Comment: (NOTE) SARS-CoV-2 target nucleic acids are NOT DETECTED.  The SARS-CoV-2 RNA is generally detectable in upper respiratory specimens during  the acute phase of infection. The lowest concentration of SARS-CoV-2 viral copies this assay can detect is 138 copies/mL. A negative result does not preclude SARS-Cov-2 infection and should not be used as the sole basis for treatment or other patient management decisions. A negative result may occur with  improper specimen collection/handling, submission of specimen other than nasopharyngeal swab, presence of viral mutation(s) within the areas targeted by this assay, and inadequate number of viral copies(<138 copies/mL). A negative result must be combined with clinical observations, patient history, and epidemiological information. The expected result is Negative.  Fact Sheet for Patients:  bloggercourse.com  Fact Sheet for Healthcare Providers:  seriousbroker.it  This test is no t yet approved or cleared by the [place]  FDA and  has been authorized for detection and/or diagnosis of SARS-CoV-2 by FDA under an  Emergency Use Authorization (EUA). This EUA will remain  in effect (meaning this test can be used) for the duration of the COVID-19 declaration under Section 564(b)(1) of the Act, 21 U.S.C.section 360bbb-3(b)(1), unless the authorization is terminated  or revoked sooner.       Influenza A by PCR NEGATIVE NEGATIVE Final   Influenza B by PCR NEGATIVE NEGATIVE Final    Comment: (NOTE) The Xpert Xpress SARS-CoV-2/FLU/RSV plus assay is intended as an aid in the diagnosis of influenza from Nasopharyngeal swab specimens and should not be used as a sole basis for treatment. Nasal washings and aspirates are unacceptable for Xpert Xpress SARS-CoV-2/FLU/RSV testing.  Fact Sheet for Patients: bloggercourse.com  Fact Sheet for Healthcare Providers: seriousbroker.it  This test is not yet approved or cleared by the [place]  FDA and has been authorized for detection and/or  diagnosis of SARS-CoV-2 by FDA under an Emergency Use Authorization (EUA). This EUA will remain in effect (meaning this test can be used) for the duration of the COVID-19 declaration under Section 564(b)(1) of the Act, 21 U.S.C. section 360bbb-3(b)(1), unless the authorization is terminated or revoked.     Resp Syncytial Virus by PCR NEGATIVE NEGATIVE Final    Comment: (NOTE) Fact Sheet for Patients: bloggercourse.com  Fact Sheet for Healthcare Providers: seriousbroker.it  This test is not yet approved or cleared by the [place]  FDA and has been authorized for detection and/or diagnosis of SARS-CoV-2 by FDA under an Emergency Use Authorization (EUA). This EUA will remain in effect (meaning this test can be used) for the duration of the COVID-19 declaration under Section 564(b)(1) of the Act, 21 U.S.C. section 360bbb-3(b)(1), unless the authorization is terminated or revoked.  Performed at [facility], [address] [place], [place] [address]   MRSA Next Gen by PCR, Nasal     Status: None   Collection Time: 12/30/23 11:23 AM   Specimen: Nasal Mucosa; Nasal Swab  Result Value Ref Range Status   MRSA by PCR Next Gen NOT DETECTED NOT DETECTED Final    Comment: (NOTE) The GeneXpert MRSA Assay (FDA approved for NASAL specimens only), is one component of a comprehensive MRSA colonization surveillance program. It is not intended to diagnose MRSA infection nor to guide or monitor treatment for MRSA infections. Test performance is not FDA approved in patients less than 87 years old. Performed at [facility], [address] [place], [place] [address]          Radiology Studies: No results found.       Scheduled Meds:  amLODipine   10 mg Oral Daily   docusate sodium   100 mg Oral BID   enoxaparin  (LOVENOX ) injection  40 mg Subcutaneous Q24H   feeding supplement  237 mL Oral BID BM   folic acid    1 mg Oral Daily   guaiFENesin   600 mg Oral BID   [START ON 01/05/2024] methotrexate   20 mg Oral Q Thu   methylPREDNISolone  (SOLU-MEDROL ) injection  40 mg Intravenous Q12H   nicotine   21 mg Transdermal Daily   pantoprazole  (PROTONIX ) IV  40 mg Intravenous Q12H   PARoxetine   10 mg Oral Daily   sodium chloride   1 g Oral BID WC   Continuous Infusions:   LOS: 1 day      Anthony CHRISTELLA Pouch, MD [facility] Pager [phone]  If 7PM-7AM, please contact night-coverage [email or web address] 01/01/2024, 8:59 AM

## 2024-01-01 NOTE — Plan of Care (Signed)
  Problem: Activity: Goal: Risk for activity intolerance will decrease Outcome: Progressing   Problem: Pain Managment: Goal: General experience of comfort will improve and/or be controlled Outcome: Progressing   Problem: Safety: Goal: Ability to remain free from injury will improve Outcome: Progressing   Problem: Skin Integrity: Goal: Risk for impaired skin integrity will decrease Outcome: Progressing

## 2024-01-02 DIAGNOSIS — J441 Chronic obstructive pulmonary disease with (acute) exacerbation: Secondary | ICD-10-CM | POA: Diagnosis not present

## 2024-01-02 LAB — CBC
HCT: 35.5 % — ABNORMAL LOW (ref 36.0–46.0)
Hemoglobin: 11.8 g/dL — ABNORMAL LOW (ref 12.0–15.0)
MCH: 33.2 pg (ref 26.0–34.0)
MCHC: 33.2 g/dL (ref 30.0–36.0)
MCV: 100 fL (ref 80.0–100.0)
Platelets: 285 K/uL (ref 150–400)
RBC: 3.55 MIL/uL — ABNORMAL LOW (ref 3.87–5.11)
RDW: 15.2 % (ref 11.5–15.5)
WBC: 8.7 K/uL (ref 4.0–10.5)
nRBC: 0 % (ref 0.0–0.2)

## 2024-01-02 LAB — BASIC METABOLIC PANEL WITH GFR
Anion gap: 9 (ref 5–15)
BUN: 33 mg/dL — ABNORMAL HIGH (ref 8–23)
CO2: 29 mmol/L (ref 22–32)
Calcium: 8.5 mg/dL — ABNORMAL LOW (ref 8.9–10.3)
Chloride: 97 mmol/L — ABNORMAL LOW (ref 98–111)
Creatinine, Ser: 0.88 mg/dL (ref 0.44–1.00)
GFR, Estimated: >60 mL/min (ref >60)
Glucose, Bld: 77 mg/dL (ref 70–99)
Potassium: 4 mmol/L (ref 3.5–5.1)
Sodium: 135 mmol/L (ref 135–145)

## 2024-01-02 NOTE — Progress Notes (Signed)
 Progress Note    Lynn Norton  FMW:[id number] DOB: [birth year]  DOA: 12/30/2023 PCP: Pcp, No      Brief Narrative:    Medical records reviewed and are as summarized below:  Lynn Norton is a 73 y.o. female  with medical history significant for multiple falls with rib fracture in the past, HTN, CKD, rheumatoid arthritis, tobacco use disorder, who was brought from the nursing home for pain in the right side of the chest wall secondary to fall at the nursing home.  Reportedly, she fell on December 24, 2023 and since then, she has had right-sided rib pain.  She was wheezing and tachycardic on arrival.   She was admitted to the hospital for COPD exacerbation and right-sided rib fractures.     Assessment/Plan:   Principal Problem:   COPD exacerbation (HCC) Active Problems:   Closed rib fracture   Rheumatoid arthritis (HCC)   Tobacco use   Gait instability   Falls, subsequent encounter    Body mass index is 16.99 kg/m.  (Underweight)    COPD exacerbation: Continue IV Solu-Medrol  and bronchodilators.  Completed 3 days of azithromycin today. Check pulse oximetry with ambulation to determine need for home oxygen.   Right rib fractures (5-7), sternal fracture, frequent falls: Analgesics as needed for pain.  PT recommended discharge to SNF.  However, patient prefers to go home.   Rheumatoid arthritis: Continue methotrexate    Comorbidities include hypertension, tobacco use disorder (counseled to quit smoking),    Diet Order             Diet regular Room service appropriate? Yes; Fluid consistency: Thin  Diet effective now                                  Consultants: None  Procedures: None    Medications:    amLODipine   10 mg Oral Daily   docusate sodium   100 mg Oral BID   enoxaparin  (LOVENOX ) injection  40 mg Subcutaneous Q24H   feeding supplement  237 mL Oral BID BM   folic acid   1 mg Oral Daily   guaiFENesin   600 mg Oral BID    [START ON 01/05/2024] methotrexate   20 mg Oral Q Thu   methylPREDNISolone  (SOLU-MEDROL ) injection  40 mg Intravenous Q24H   nicotine   21 mg Transdermal Daily   pantoprazole  (PROTONIX ) IV  40 mg Intravenous Q12H   PARoxetine   10 mg Oral Daily   sodium chloride   1 g Oral BID WC   Continuous Infusions:   Anti-infectives (From admission, onward)    Start     Dose/Rate Route Frequency Ordered Stop   12/31/23 1015  azithromycin (ZITHROMAX) tablet 500 mg       Placed in Followed by Linked Group   500 mg Oral Daily 12/30/23 1014 01/01/24 0833   12/30/23 1015  azithromycin (ZITHROMAX) 500 mg in sodium chloride  0.9 % 250 mL IVPB       Placed in Followed by Linked Group   500 mg 250 mL/hr over 60 Minutes Intravenous Every 24 hours 12/30/23 1014 12/30/23 1331              Family Communication/Anticipated D/C date and plan/Code Status   DVT prophylaxis: enoxaparin  (LOVENOX ) injection 40 mg Start: 12/30/23 2200 SCDs Start: 12/30/23 1013     Code Status: Limited: Do not attempt resuscitation (DNR) -DNR-LIMITED -Do Not Intubate/DNI   Family Communication: None Disposition  Plan: Plan to discharge to home   Status is: Inpatient Remains inpatient appropriate because: COPD exacerbation       Subjective:   Interval events noted.  She complains of congested cough.  She still has right chest wall pain from rib fracture.  Breathing is better.  Objective:    Vitals:   01/01/24 1605 01/01/24 2155 01/02/24 0435 01/02/24 0830  BP: 124/63 (!) 148/74 (!) 167/70 (!) 140/74  Pulse: 78 78 63 66  Resp: 20 17 18    Temp: 98.4 F (36.9 C) 98.4 F (36.9 C) 98.1 F (36.7 C)   TempSrc:  Oral    SpO2: 96% 95% 97% 94%  Weight:      Height:       No data found.   Intake/Output Summary (Last 24 hours) at 01/02/2024 1500 Last data filed at 01/01/2024 2143 Gross per 24 hour  Intake 120 ml  Output --  Net 120 ml   Filed Weights   12/30/23 1152  Weight: 39.5 kg     Exam:  GEN: NAD SKIN: Warm and dry EYES: No pallor or icterus ENT: MMM CV: RRR PULM: Mild bibasilar rales ABD: soft, ND, NT, +BS CNS: AAO x 3, non focal EXT: No edema or tenderness MSK: Right chest wall tenderness       Data Reviewed:   I have personally reviewed following labs and imaging studies:  Labs: Labs show the following:   Basic Metabolic Panel: Recent Labs  Lab 12/30/23 0524 12/31/23 0809 01/01/24 0527 01/02/24 0500  NA 141 136 135 135  K 4.1 5.0 5.1 4.0  CL 101 98 96* 97*  CO2 28 26 30 29   GLUCOSE 103* 117* 94 77  BUN 23 31* 42* 33*  CREATININE 0.91 0.91 0.83 0.88  CALCIUM 8.9 8.8* 8.9 8.5*   GFR Estimated Creatinine Clearance: 34.4 mL/min (by C-G formula based on SCr of 0.88 mg/dL). Liver Function Tests: Recent Labs  Lab 12/30/23 0524  AST 27  ALT 20  ALKPHOS 63  BILITOT 0.7  PROT 7.0  ALBUMIN 3.9   Recent Labs  Lab 12/30/23 0524  LIPASE 35   No results for input(s): AMMONIA in the last 168 hours. Coagulation profile Recent Labs  Lab 12/31/23 0809  INR 1.0    CBC: Recent Labs  Lab 12/30/23 0524 12/31/23 0809 01/01/24 0527 01/02/24 0500  WBC 10.5 13.1* 12.7* 8.7  NEUTROABS 7.3  --   --   --   HGB 12.4 11.3* 11.3* 11.8*  HCT 38.3 34.5* 33.8* 35.5*  MCV 104.6* 102.7* 100.9* 100.0  PLT 307 248 291 285   Cardiac Enzymes: No results for input(s): CKTOTAL, CKMB, CKMBINDEX, TROPONINI in the last 168 hours. BNP (last 3 results) No results for input(s): PROBNP in the last 8760 hours. CBG: No results for input(s): GLUCAP in the last 168 hours. D-Dimer: No results for input(s): DDIMER in the last 72 hours. Hgb A1c: No results for input(s): HGBA1C in the last 72 hours. Lipid Profile: No results for input(s): CHOL, HDL, LDLCALC, TRIG, CHOLHDL, LDLDIRECT in the last 72 hours. Thyroid function studies: No results for input(s): TSH, T4TOTAL, T3FREE, THYROIDAB in the last 72  hours.  Invalid input(s): FREET3 Anemia work up: No results for input(s): VITAMINB12, FOLATE, FERRITIN, TIBC, IRON, RETICCTPCT in the last 72 hours. Sepsis Labs: Recent Labs  Lab 12/30/23 0524 12/31/23 0809 01/01/24 0527 01/02/24 0500  WBC 10.5 13.1* 12.7* 8.7    Microbiology Recent Results (from the past 240 hours)  Resp  panel by RT-PCR (RSV, Flu A&B, Covid) Anterior Nasal Swab     Status: None   Collection Time: 12/30/23  5:24 AM   Specimen: Anterior Nasal Swab  Result Value Ref Range Status   SARS Coronavirus 2 by RT PCR NEGATIVE NEGATIVE Final    Comment: (NOTE) SARS-CoV-2 target nucleic acids are NOT DETECTED.  The SARS-CoV-2 RNA is generally detectable in upper respiratory specimens during the acute phase of infection. The lowest concentration of SARS-CoV-2 viral copies this assay can detect is 138 copies/mL. A negative result does not preclude SARS-Cov-2 infection and should not be used as the sole basis for treatment or other patient management decisions. A negative result may occur with  improper specimen collection/handling, submission of specimen other than nasopharyngeal swab, presence of viral mutation(s) within the areas targeted by this assay, and inadequate number of viral copies(<138 copies/mL). A negative result must be combined with clinical observations, patient history, and epidemiological information. The expected result is Negative.  Fact Sheet for Patients:  bloggercourse.com  Fact Sheet for Healthcare Providers:  seriousbroker.it  This test is no t yet approved or cleared by the [place]  FDA and  has been authorized for detection and/or diagnosis of SARS-CoV-2 by FDA under an Emergency Use Authorization (EUA). This EUA will remain  in effect (meaning this test can be used) for the duration of the COVID-19 declaration under Section 564(b)(1) of the Act, 21 U.S.C.section  360bbb-3(b)(1), unless the authorization is terminated  or revoked sooner.       Influenza A by PCR NEGATIVE NEGATIVE Final   Influenza B by PCR NEGATIVE NEGATIVE Final    Comment: (NOTE) The Xpert Xpress SARS-CoV-2/FLU/RSV plus assay is intended as an aid in the diagnosis of influenza from Nasopharyngeal swab specimens and should not be used as a sole basis for treatment. Nasal washings and aspirates are unacceptable for Xpert Xpress SARS-CoV-2/FLU/RSV testing.  Fact Sheet for Patients: bloggercourse.com  Fact Sheet for Healthcare Providers: seriousbroker.it  This test is not yet approved or cleared by the [place]  FDA and has been authorized for detection and/or diagnosis of SARS-CoV-2 by FDA under an Emergency Use Authorization (EUA). This EUA will remain in effect (meaning this test can be used) for the duration of the COVID-19 declaration under Section 564(b)(1) of the Act, 21 U.S.C. section 360bbb-3(b)(1), unless the authorization is terminated or revoked.     Resp Syncytial Virus by PCR NEGATIVE NEGATIVE Final    Comment: (NOTE) Fact Sheet for Patients: bloggercourse.com  Fact Sheet for Healthcare Providers: seriousbroker.it  This test is not yet approved or cleared by the [place]  FDA and has been authorized for detection and/or diagnosis of SARS-CoV-2 by FDA under an Emergency Use Authorization (EUA). This EUA will remain in effect (meaning this test can be used) for the duration of the COVID-19 declaration under Section 564(b)(1) of the Act, 21 U.S.C. section 360bbb-3(b)(1), unless the authorization is terminated or revoked.  Performed at [facility], [address] [place], [place] [address]   MRSA Next Gen by PCR, Nasal     Status: None   Collection Time: 12/30/23 11:23 AM   Specimen: Nasal Mucosa; Nasal Swab  Result Value Ref Range  Status   MRSA by PCR Next Gen NOT DETECTED NOT DETECTED Final    Comment: (NOTE) The GeneXpert MRSA Assay (FDA approved for NASAL specimens only), is one component of a comprehensive MRSA colonization surveillance program. It is not intended to diagnose MRSA infection nor to guide or  monitor treatment for MRSA infections. Test performance is not FDA approved in patients less than 57 years old. Performed at [facility], [address] [place], [place] [address]     Procedures and diagnostic studies:  No results found.             LOS: 2 days   Rahim Astorga  [facility]   Pager on [email or web address]. If 7PM-7AM, please contact night-coverage at [email or web address]     01/02/2024, 3:00 PM

## 2024-01-02 NOTE — Plan of Care (Signed)

## 2024-01-02 NOTE — Progress Notes (Signed)
 Physical Therapy Treatment Patient Details Name: Lynn Norton MRN: [id number] DOB: 1950/11/23 Today's Date: 01/02/2024   History of Present Illness 73 y.o. female who was brought in from nursing home for right rib cage pain after a fall at the nursing home. CT angio chest negative for PE and subacute nondisplaced sternal fracture. Current MD assessment: COPD exacerbation, frequent falls with repeated rib fxs and acute mildly displaced right lateral 6th and 7th rib fractures and nondisplaced anterior 5th rib fracture. PMH of multiple falls with rib fracture in the past, HTN, CKD, rheumatoid arthritis, tobacco abuse.    PT Comments  Pt is progressing with mobility performing at an overall Min A+1 with RW.  Pt continues to experience limitations to mobility, demonstrating decreased activity tolerance, general weakness, and significant pain in ribs.  Continue to recommend post acute rehab <3 hours therapy/day upon d/c.     If plan is discharge home, recommend the following: A little help with walking and/or transfers;A little help with bathing/dressing/bathroom;Assistance with cooking/housework;Help with stairs or ramp for entrance;Direct supervision/assist for medications management   Can travel by private vehicle     Yes  Equipment Recommendations       Recommendations for Other Services       Precautions / Restrictions Precautions Precautions: Fall Recall of Precautions/Restrictions: Impaired Restrictions Weight Bearing Restrictions Per Provider Order: No     Mobility  Bed Mobility Overal bed mobility: Needs Assistance Bed Mobility: Supine to Sit     Supine to sit: Contact guard, Min assist     General bed mobility comments: cues to use hand rail for supine to sit.    Transfers Overall transfer level: Needs assistance Equipment used: Rolling walker (2 wheels) Transfers: Sit to/from Stand, Bed to chair/wheelchair/BSC Sit to Stand: Min assist   Step pivot transfers: Min  assist            Ambulation/Gait Ambulation/Gait assistance: Contact guard assist Gait Distance (Feet): 20 Feet Assistive device: Rolling walker (2 wheels) Gait Pattern/deviations: Step-to pattern, Decreased stride length, Drifts right/left, Trunk flexed, Narrow base of support       General Gait Details: Maintains LLE in extension. Circumduction of LLE in swing phase. generally waddling gait.  Pt had multple stops to adjustRW position.  Required verbal encouragement to ambulate from bed to door and back; self limiting.  3L O2 donned  SPO2> 90%.   Stairs             Wheelchair Mobility     Tilt Bed    Modified Rankin (Stroke Patients Only)       Balance Overall balance assessment: Needs assistance Sitting-balance support: Feet supported Sitting balance-Leahy Scale: Fair     Standing balance support: Single extremity supported, Bilateral upper extremity supported, During functional activity Standing balance-Leahy Scale: Poor Standing balance comment: LOB during sit<>stand (1-0 UE support 2/2 having difficulty reaching for RW )off to right side requiring Min A to maintain balance                            Communication Communication Communication: No apparent difficulties  Cognition Arousal: Alert Behavior During Therapy: WFL for tasks assessed/performed   PT - Cognitive impairments: Difficult to assess, No family/caregiver present to determine baseline                         Following commands: Impaired Following commands impaired: Follows one step commands with increased time  Cueing Cueing Techniques: Verbal cues  Exercises      General Comments        Pertinent Vitals/Pain Pain Assessment Faces Pain Scale: Hurts whole lot Pain Location: ribs Pain Descriptors / Indicators: Sore Pain Intervention(s): Limited activity within patient's tolerance, Premedicated before session, Monitored during session    Home Living                           Prior Function            PT Goals (current goals can now be found in the care plan section) Acute Rehab PT Goals PT Goal Formulation: Patient unable to participate in goal setting Time For Goal Achievement: 01/13/24 Potential to Achieve Goals: Good Progress towards PT goals: Progressing toward goals    Frequency    Min 2X/week      PT Plan      Co-evaluation              AM-PAC PT 6 Clicks Mobility   Outcome Measure  Help needed turning from your back to your side while in a flat bed without using bedrails?: A Lot Help needed moving from lying on your back to sitting on the side of a flat bed without using bedrails?: A Lot Help needed moving to and from a bed to a chair (including a wheelchair)?: A Little Help needed standing up from a chair using your arms (e.g., wheelchair or bedside chair)?: A Lot Help needed to walk in hospital room?: A Little Help needed climbing 3-5 steps with a railing? : A Lot 6 Click Score: 14    End of Session Equipment Utilized During Treatment: Gait belt Activity Tolerance: Patient tolerated treatment well Patient left: in bed;with bed alarm set Nurse Communication: Mobility status PT Visit Diagnosis: Unsteadiness on feet (R26.81);Other abnormalities of gait and mobility (R26.89);Muscle weakness (generalized) (M62.81);History of falling (Z91.81)     Time: 8969-8951 PT Time Calculation (min) (ACUTE ONLY): 18 min  Charges:    $Therapeutic Activity: 23-37 mins PT General Charges $$ ACUTE PT VISIT: 1 Visit                     Harland Irving, PTA  01/02/24, 11:05 AM

## 2024-01-02 NOTE — Progress Notes (Signed)
 Occupational Therapy Treatment Patient Details Name: Lynn Norton MRN: [id number] DOB: 08/23/1950 Today's Date: 01/02/2024   History of present illness 73 y.o. female who was brought in from nursing home for right rib cage pain after a fall at the nursing home. CT angio chest negative for PE and subacute nondisplaced sternal fracture. Current MD assessment: COPD exacerbation, frequent falls with repeated rib fxs and acute mildly displaced right lateral 6th and 7th rib fractures and nondisplaced anterior 5th rib fracture. PMH of multiple falls with rib fracture in the past, HTN, CKD, rheumatoid arthritis, tobacco abuse.   OT comments  Pt seen for OT treatment on this date. Upon arrival to room pt semi supine in bed, agreeable to tx. Pt eager to get to the [facility] on arrival to room, supervision for lateral/lean pericare, 1 HHA throughout ambulation around bed and into recliner. Pt on 1.5L via [place] at the end of session, spo2 levels low 90s. Pt making good progress toward goals, will continue to follow POC. Discharge recommendation remains appropriate.        If plan is discharge home, recommend the following:  A little help with walking and/or transfers;A little help with bathing/dressing/bathroom;Direct supervision/assist for financial management;Supervision due to cognitive status;Direct supervision/assist for medications management   Equipment Recommendations  Other (comment)       Precautions / Restrictions Precautions Precautions: Fall Recall of Precautions/Restrictions: Impaired Restrictions Weight Bearing Restrictions Per Provider Order: No       Mobility Bed Mobility Overal bed mobility: Needs Assistance Bed Mobility: Supine to Sit     Supine to sit: Min assist     General bed mobility comments: Verbal cues for technique, urgency to get to toilet - disreguarded attempt to educate log rolling for comfort    Transfers Overall transfer level: Needs assistance Equipment used:  1 person hand held assist Transfers: Sit to/from Stand, Bed to chair/wheelchair/BSC Sit to Stand: Min assist Stand pivot transfers: Min assist   Step pivot transfers: Min assist     General transfer comment: 1 HHA ambulate around bed into recliner from [facility], reaching with RUE to furniture. Delines RW use at this time.     Balance Overall balance assessment: Needs assistance Sitting-balance support: Feet supported Sitting balance-Leahy Scale: Fair Sitting balance - Comments:  (Steady reaching within BOS)   Standing balance support: Single extremity supported, Bilateral upper extremity supported, During functional activity Standing balance-Leahy Scale: Poor                             ADL either performed or assessed with clinical judgement   ADL Overall ADL's : Needs assistance/impaired Eating/Feeding: Sitting;Modified independent   Grooming: Wash/dry hands;Standing;Contact guard assist                   Toilet Transfer: Minimal assistance;BSC/3in1;Cueing for safety;Stand-pivot   Toileting- Clothing Manipulation and Hygiene: Supervision/safety;Sitting/lateral lean       Functional mobility during ADLs: Minimal assistance General ADL Comments: 1 HHA utilized during transfers for ADL completion, seated lateral lean pericare with supervision     Communication Communication Communication: No apparent difficulties   Cognition Arousal: Alert Behavior During Therapy: WFL for tasks assessed/performed Cognition: Cognition impaired   Orientation impairments: Time, Therapist, Music functioning impairment (select all impairments): Problem solving, Reasoning, Initiation                   Following commands: Impaired Following commands  impaired: Follows multi-step commands with increased time      Cueing   Cueing Techniques: Verbal cues  Exercises Exercises: Other exercises Other Exercises Other Exercises: Edu: Role of OT, incentive  spiromotor frequency/duration           General Comments Pt on RA on arrival to room as oxygen was not turned on, oxygen Donald securly in Pt nose (spo2 level 81%). Pt placed on 1.5L with a quick recovery to 90s. - RN notified    Pertinent Vitals/ Pain       Pain Assessment Pain Assessment: Faces Faces Pain Scale: Hurts even more Pain Location: ribs Pain Descriptors / Indicators: Sore, Sharp Pain Intervention(s): Limited activity within patient's tolerance, Repositioned                                                          Frequency  Min 2X/week        Progress Toward Goals  OT Goals(current goals can now be found in the care plan section)  Progress towards OT goals: Progressing toward goals  Acute Rehab OT Goals OT Goal Formulation: Patient unable to participate in goal setting Time For Goal Achievement: 01/13/24 Potential to Achieve Goals: Fair ADL Goals Pt Will Perform Lower Body Bathing: with contact guard assist;sitting/lateral leans;sit to/from stand Pt Will Perform Lower Body Dressing: with contact guard assist;sitting/lateral leans;sit to/from stand Pt Will Transfer to Toilet: with supervision;with contact guard assist;ambulating   AM-PAC OT 6 Clicks Daily Activity     Outcome Measure   Help from another person eating meals?: A Little Help from another person taking care of personal grooming?: A Little Help from another person toileting, which includes using toliet, bedpan, or urinal?: A Lot Help from another person bathing (including washing, rinsing, drying)?: A Lot Help from another person to put on and taking off regular upper body clothing?: A Little Help from another person to put on and taking off regular lower body clothing?: A Lot 6 Click Score: 15    End of Session Equipment Utilized During Treatment: Gait belt  OT Visit Diagnosis: Other abnormalities of gait and mobility (R26.89);Muscle weakness (generalized)  (M62.81);Unsteadiness on feet (R26.81)   Activity Tolerance Patient tolerated treatment well   Patient Left in chair;with call bell/phone within reach;with chair alarm set   Nurse Communication Mobility status        Time: 8661-8646 OT Time Calculation (min): 15 min  Charges: OT General Charges $OT Visit: 1 Visit OT Treatments $Self Care/Home Management : 8-22 mins  Larraine Colas M.S. OTR/L  01/02/24, 2:28 PM

## 2024-01-03 DIAGNOSIS — J9611 Chronic respiratory failure with hypoxia: Secondary | ICD-10-CM | POA: Insufficient documentation

## 2024-01-03 DIAGNOSIS — J441 Chronic obstructive pulmonary disease with (acute) exacerbation: Secondary | ICD-10-CM | POA: Diagnosis not present

## 2024-01-03 NOTE — Care Management Important Message (Signed)
 Important Message  Patient Details  Name: Lynn Norton MRN: [id number] Date of Birth: [birth year]   Important Message Given:  Yes - Medicare IM     Liara Holm W, CMA 01/03/2024, 11:39 AM

## 2024-01-03 NOTE — Progress Notes (Signed)
 Pt's O2 Sat was observed during ambulation on 11/10 & today. Yesterday, during ambulation, O2 Sats ranged from 81% - 87% on RA.  When Pt was sitting, O2 Sats ranged from 91% - 93% on RA, 97% with O2 lpm. Today during ambulation, O2 Sats were 87% on RA. When Pt was sitting, O2 Sats were 93% on RA, 97% on O2 2LPM. MD made aware.

## 2024-01-03 NOTE — TOC Transition Note (Signed)
 Transition of Care [facility]) - Discharge Note   Patient Details  Name: Neera Teng MRN: [id number] Date of Birth: Jul 09, 1950  [facility] CM/SW Contact:  Daved JONETTA Hamilton, RN Phone Number: 01/03/2024, 4:42 PM   Clinical Narrative:     Patient will DC to: [facility] Anticipated DC date: 01/03/2024 Family notified: Lynwood McIver HCPOA, lvmm Transport by: Zona  Per MD patient ready for DC to ALF. RN, patient, patient's family, and facility notified of DC. Discharge Summary and FL2 sent to facility. DC packet on chart. Ambulance transport requested for patient.   TOC signing off.   Final next level of care: Assisted Living Barriers to Discharge: Barriers Resolved   Patient Goals and CMS Choice            Discharge Placement                  Name of family member notified: [employer] [facility], left VMM Patient and family notified of of transfer: 01/03/24  Discharge Plan and Services Additional resources added to the After Visit Summary for     Discharge Planning Services: CM Consult            DME Arranged: Oxygen DME Agency: Other - Comment Jeoffrey) Date DME Agency Contacted: 01/03/24 Time DME Agency Contacted: [phone] Representative spoke with at DME Agency: Rosina Fish            Social Drivers of Health (SDOH) Interventions SDOH Screenings   Food Insecurity: No Food Insecurity (12/30/2023)  Housing: Low Risk  (12/30/2023)  Transportation Needs: No Transportation Needs (12/30/2023)  Utilities: Not At Risk (12/30/2023)  Financial Resource Strain: Low Risk  (06/29/2023)   Received [facility]  Recent Concern: Financial Resource Strain - High Risk (05/25/2023)   Received from [facility]  Social Connections: Moderately Isolated (12/30/2023)  Tobacco Use: High Risk (12/30/2023)     Readmission Risk Interventions     No data to display

## 2024-01-03 NOTE — Discharge Summary (Signed)
 Physician Discharge Summary   Patient: Lynn Norton MRN: [id number] DOB: 1950-05-18  Admit date:     12/30/2023  Discharge date: 01/03/24  Discharge Physician: AIDA CHO   PCP: Pcp, No   Recommendations at discharge:   Follow-up with PCP in 1 week  Discharge Diagnoses: Principal Problem:   COPD exacerbation (HCC) Active Problems:   Closed rib fracture   Rheumatoid arthritis (HCC)   Tobacco use   Gait instability   Falls, subsequent encounter   Chronic hypoxic respiratory failure (HCC)  Resolved Problems:   * No resolved hospital problems. *  Hospital Course:  Lynn Norton is a 73 y.o. female  with medical history significant for multiple falls with rib fracture in the past, HTN, CKD, rheumatoid arthritis, tobacco use disorder, who was brought from the nursing home for pain in the right side of the chest wall secondary to fall at the nursing home.  Reportedly, she fell on December 24, 2023 and since then, she has had right-sided rib pain.  She was wheezing and tachycardic on arrival.     She was admitted to the hospital for COPD exacerbation and right-sided rib fractures.   Assessment and Plan:  COPD exacerbation: Completed 5 days of IV Solu-Medrol .  Completed 3 days of azithromycin.   Chronic hypoxic respiratory failure: Oxygen saturation with ambulation was 87% on room air.  Oxygen saturation was 7% on 2 L of oxygen. She will be discharged on home oxygen, 2 L/min.     Right rib fractures (5-7), sternal fracture, frequent falls: Analgesics as needed for pain.  PT recommended discharge to SNF.  However, patient prefers to go home.     Rheumatoid arthritis: Continue methotrexate      Comorbidities include hypertension, tobacco use disorder (counseled to quit smoking),   Her condition has improved and she is deemed stable for discharge to home today.  I obtained permission from the patient to talk to Mr. Lynn Norton, HPOA, to discuss the plan of care.  I called  his phone at [phone] but the call went to voicemail.  I left a voice message for him to call back with questions.         Consultants: None Procedures performed: None Disposition: Home health Diet recommendation:  Discharge Diet Orders (From admission, onward)     Start     Ordered   01/03/24 0000  Diet - low sodium heart healthy        01/03/24 1102           Cardiac diet DISCHARGE MEDICATION: Allergies as of 01/03/2024       Reactions   Other Itching, Other (See Comments)   Sulfa Antibiotics Other (See Comments), Itching   Ceftriaxone Rash   Penicillins Rash, Other (See Comments)   Rocephin [ceftriaxone] Hives   Spoke with patient. States she had hives that spread in response to rocephin   Sulfa Antibiotics    Penicillins Itching        Medication List     STOP taking these medications    lidocaine  5 % Commonly known as: LIDODERM    multivitamin with minerals Tabs tablet   nicotine  21 mg/24hr patch Commonly known as: NICODERM CQ  - dosed in mg/24 hours   nicotine  polacrilex 2 MG gum Commonly known as: NICORETTE    omeprazole  20 MG tablet Commonly known as: PRILOSEC  OTC   oxyCODONE  5 MG immediate release tablet Commonly known as: Roxicodone    oxyCODONE -acetaminophen  5-325 MG tablet Commonly known as: PERCOCET/ROXICET   predniSONE  5  MG tablet Commonly known as: DELTASONE    Turmeric 500 MG Caps       TAKE these medications    acetaminophen  325 MG tablet Commonly known as: TYLENOL  Take 650 mg by mouth every 6 (six) hours as needed for mild pain (pain score 1-3) or moderate pain (pain score 4-6).   albuterol  108 (90 Base) MCG/ACT inhaler Commonly known as: VENTOLIN  HFA Inhale 1-2 puffs into the lungs 4 (four) times daily as needed for wheezing or shortness of breath.   amLODipine  10 MG tablet Commonly known as: NORVASC  Take 1 tablet (10 mg total) by mouth daily.   dextromethorphan-guaiFENesin  30-600 MG 12hr tablet Commonly known  as: MUCINEX  DM Take 1 tablet by mouth 2 (two) times daily as needed for cough.   docusate sodium  100 MG capsule Commonly known as: COLACE Take 1 capsule (100 mg total) by mouth 2 (two) times daily.   feeding supplement Liqd Take 237 mLs by mouth 2 (two) times daily between meals.   folic acid  1 MG tablet Commonly known as: FOLVITE  Take 1 mg by mouth daily.   ibuprofen  600 MG tablet Commonly known as: ADVIL  Take 1 tablet (600 mg total) by mouth every 8 (eight) hours as needed for mild pain (pain score 1-3) or fever (temp > 38.3 Celsius). What changed: when to take this   methocarbamol  500 MG tablet Commonly known as: ROBAXIN  Take 1 tablet (500 mg total) by mouth every 8 (eight) hours as needed for muscle spasms.   methotrexate  2.5 MG tablet Commonly known as: RHEUMATREX Take 20 mg by mouth every Thursday.   multivitamins ther. w/minerals Tabs tablet Take 1 tablet by mouth daily.   naloxone 4 MG/0.1ML Liqd nasal spray kit Commonly known as: NARCAN Place 1 spray into the nose 3 (three) times daily as needed.   omeprazole  20 MG capsule Commonly known as: PRILOSEC  Take 20 mg by mouth daily.   PARoxetine  10 MG tablet Commonly known as: PAXIL  Take 10 mg by mouth daily.   sodium chloride  1 g tablet Take 1 tablet (1 g total) by mouth 2 (two) times daily with a meal.   thiamine  100 MG tablet Commonly known as: Vitamin B-1 Take 1 tablet (100 mg total) by mouth daily.   Vitamin D  (Ergocalciferol ) 1.25 MG (50000 UNIT) Caps capsule Commonly known as: DRISDOL Take 1,000 Units by mouth daily.               Durable Medical Equipment  (From admission, onward)           Start     Ordered   01/03/24 0000  For home use only DME oxygen       Question Answer Comment  Length of Need Lifetime   Mode or (Route) Nasal cannula   Liters per Minute 2   Frequency Continuous (stationary and portable oxygen unit needed)   Oxygen conserving device Yes   Oxygen delivery  system: Gas   Oxygen delivery system: Portable concentrator (POC)      01/03/24 1104            Discharge Exam: Filed Weights   12/30/23 1152  Weight: 39.5 kg   GEN: NAD SKIN: Warm and dry EYES: No pallor or icterus ENT: MMM CV: RRR PULM: CTA B ABD: soft, ND, NT, +BS CNS: AAO x 3, non focal EXT: No edema or tenderness MSK: Right chest wall tenderness  Condition at discharge: good  The results of significant diagnostics from this hospitalization (including imaging, microbiology,  ancillary and laboratory) are listed below for reference.   Imaging Studies: CT ABDOMEN PELVIS W CONTRAST Result Date: 12/30/2023 EXAM: CT ABDOMEN AND PELVIS WITH CONTRAST 12/30/2023 06:27:48 AM TECHNIQUE: CT of the abdomen and pelvis was performed with the administration of 75 mL iohexol (OMNIPAQUE) 350 MG/ML injection. Multiplanar reformatted images are provided for review. Automated exposure control, iterative reconstruction, and/or weight-based adjustment of the mA/kV was utilized to reduce the radiation dose to as low as reasonably achievable. COMPARISON: CTA chest 12/30/2023 reported separately. Non-contrast CT abdomen and pelvis 06/13/2021. CLINICAL HISTORY: 73 year old female. Recent fall, has right upper quadrant abdominal pain on exam. FINDINGS: LOWER CHEST: CTA chest is reported separately today. LIVER: Liver enhancement remains within normal limits. GALLBLADDER AND BILE DUCTS: Evidence of a 6 mm gallstone in the neck of the gallbladder, but the gallbladder is nondistended, no regional inflammation. No biliary ductal dilatation. SPLEEN: No acute abnormality. PANCREAS: No acute abnormality. ADRENAL GLANDS: No acute abnormality. KIDNEYS, URETERS AND BLADDER: Symmetric renal enhancement and contrast excretion. Normal ureters. No stones in the kidneys or ureters. No hydronephrosis. No perinephric or periureteral stranding. Diminutive, unremarkable bladder. GI AND BOWEL: Redundant large bowel with  retained stool. Sigmoid diverticulosis; no active large bowel inflammation identified. Diminutive or absent appendix. Nondilated small bowel and stomach. There is no bowel obstruction. PERITONEUM AND RETROPERITONEUM: No ascites. No free air. No pelvis free fluid. VASCULATURE: Bulky calcified aortoiliac atherosclerosis. Tortuous abdominal aorta. Occluded left common iliac artery on series 2 image 39, reconstituted left iliac artery by bifurcation on series 2 image 50. Additional bilateral iliac and bilateral proximal femoral artery atherosclerosis, but the other major arterial structures remain patent. Portal venous system appears to be patent. LYMPH NODES: No lymphadenopathy. REPRODUCTIVE ORGANS: No acute abnormality. BONES AND SOFT TISSUES: Advanced lumbar spine degeneration with evidence of multilevel developing degenerative interbody ankylosis (most notably L4-L5). Maintained lumbar vertebral height. Underlying moderate chronic dextroconvex lumbar scoliosis. Chronic pubic symphysis and bilateral hip degeneration. Chronic right sacral ala fracture with sclerosis is stable. Interval healing of left acetabular and inferior pubic ramus fractures. No focal soft tissue abnormality. IMPRESSION: 1. Bulky calcified aortoiliac atherosclerosis with occluded left common iliac artery but distal reconstitution. Other major arterial structures remain patent. 2. No acute or inflammatory process identified in the abdomen or pelvis. 3. Cholelithiasis. 4. CTA Chest reported separately. Electronically signed by: Helayne Hurst MD 12/30/2023 06:53 AM EST RP Workstation: [id number]   CT Angio Chest PE W and/or Wo Contrast Result Date: 12/30/2023 EXAM: CTA CHEST 12/30/2023 06:27:48 AM TECHNIQUE: CTA of the chest was performed without and with the administration of 75 mL of intravenous contrast (iohexol (OMNIPAQUE) 350 MG/ML injection 75 mL IOHEXOL 350 MG/ML SOLN). Multiplanar reformatted images are provided for review. MIP images are  provided for review. Automated exposure control, iterative reconstruction, and/or weight based adjustment of the mA/kV was utilized to reduce the radiation dose to as low as reasonably achievable. COMPARISON: Chest CT without contrast 11/10/2023. CT abdomen and pelvis today reported separately. CLINICAL HISTORY: 73 year old female with tachycardia, tachypnea, recent fall and rib injury, worsening pain, increased work of breathing, and rhonchorous breath sounds. FINDINGS: PULMONARY ARTERIES: Pulmonary arteries are adequately opacified for evaluation. Good pulmonary artery contrast timing. Marked central pulmonary artery enlargement nearly 4 cm diameter (series 6 image 204). No pulmonary artery filling defect. Respiratory motion artifact at the basal segments of both lungs. MEDIASTINUM: Heart size at the upper limits of normal. No pericardial effusion. Superimposed dilated / ectatic thoracic aorta also no thoracic  aortic aneurysm or dissection. Advanced calcified thoracic aortic atherosclerosis. LYMPH NODES: No mediastinal, hilar or axillary lymphadenopathy. LUNGS AND PLEURA: Centrilobular emphysema is diffuse. Mild to moderate retained secretions in the trachea carina. Major airways remain patent. There is chronic bronchiectasis and atelectasis in the medial segment of the right middle lobe, unchanged. No new pulmonary abnormality. No evidence of pleural effusion or pneumothorax. UPPER ABDOMEN: Dedicated CT abdomen and pelvis today reported separately. SOFT TISSUES AND BONES: Partially visible right shoulder arthroplasty with streak artifact. Healing left anterior rib fracture such as the anterior left third rib on series 5 image 56. Healing left posterior 11th and lateral 10th left rib fractures which were present in september. Lesser healing of posterior left 12th rib fracture, also present in september. Healing right posterior 12th rib fracture and lateral right 9th, 10th, and 11th rib fractures which were present  in september. New and acute appearing mildly displaced right lateral 6th and 7th rib fractures. Nondisplaced right anterior 5th rib fracture is age indeterminate. Subacute appearing nondisplaced sternal fracture with some healing sagittal image 71. This appears new since september. Stable thoracic vertebral height and alignment, no significant thoracic compression. IMPRESSION: 1. No acute pulmonary embolism identified. Chronic central pulmonary artery enlargement suggesting pulmonary hypertension. Emphysema. 2. Acute mildly displaced right lateral 6th and 7th rib fractures, nondisplaced anterior 5th rib fracture. Subacute nondisplaced sternal fracture. 3. Numerous additional healing bilateral rib fractures which were present in September. Electronically signed by: Helayne Hurst MD 12/30/2023 06:48 AM EST RP Workstation: [id number]   CT Cervical Spine Wo Contrast Result Date: 12/30/2023 EXAM: CT CERVICAL SPINE WITHOUT CONTRAST 12/30/2023 06:27:48 AM TECHNIQUE: CT of the cervical spine was performed without the administration of intravenous contrast. Multiplanar reformatted images are provided for review. Automated exposure control, iterative reconstruction, and/or weight based adjustment of the mA/kV was utilized to reduce the radiation dose to as low as reasonably achievable. COMPARISON: CT Cervical Spine 11/11/2023. Head CT reported separately today. CLINICAL HISTORY: 73 year old female with neck trauma, pain, and rib fractures. FINDINGS: CERVICAL SPINE: BONES AND ALIGNMENT: Chronic straightening and mild reversal of the normal cervical lordosis. Calcified ligamentous hypertrophy about the odontoid. No acute fracture or traumatic malalignment. DEGENERATIVE CHANGES: Chronic severe cervical disc and endplate degeneration with evidence of developing degenerative interbody ankylosis from C4-C5 through the cervicothoracic junction. No significant cervical spinal stenosis by CT. SOFT TISSUES: Streak artifact in the  non-contrast neck soft tissues. Calcified cervical carotid atherosclerosis. Negative visible non-contrast thoracic inlet. No prevertebral soft tissue swelling. IMPRESSION: 1. No acute traumatic injury identified in the cervical spine. 2. Chronic severe cervical spondylosis with developing interbody ankylosis. Electronically signed by: Helayne Hurst MD 12/30/2023 06:39 AM EST RP Workstation: [id number]   CT HEAD WO CONTRAST ( ) Result Date: 12/30/2023 EXAM: CT HEAD WITHOUT CONTRAST 12/30/2023 06:27:48 AM TECHNIQUE: CT of the head was performed without the administration of intravenous contrast. Automated exposure control, iterative reconstruction, and/or weight based adjustment of the mA/kV was utilized to reduce the radiation dose to as low as reasonably achievable. COMPARISON: Brain MRI 10/10/2023. Head CT 11/11/2023. CLINICAL HISTORY: 73 year old female with minor head trauma, pain, and rib fractures. FINDINGS: BRAIN AND VENTRICLES: No acute hemorrhage. No evidence of acute infarct. Stable brain volume. Ex vacuo appearing ventricular enlargement. Confluent bilateral cerebral white matter hypodensity is stable. Chronic dural calcification. No extra-axial collection. No mass effect or midline shift. Calcified atherosclerosis of the skull base. No suspicious intracranial vascular hyperdensity. ORBITS: No acute abnormality. SINUSES: No acute abnormality. SOFT TISSUES AND  SKULL: No acute soft tissue abnormality. No skull fracture. IMPRESSION: 1. No acute intracranial abnormality  or acute traumatic injury identified. 2. Stable brain volume loss and advanced cerebral white matter disease. Electronically signed by: Helayne Hurst MD 12/30/2023 06:37 AM EST RP Workstation: [id number]    Microbiology: Results for orders placed or performed during the hospital encounter of 12/30/23  Resp panel by RT-PCR (RSV, Flu A&B, Covid) Anterior Nasal Swab     Status: None   Collection Time: 12/30/23  5:24 AM   Specimen:  Anterior Nasal Swab  Result Value Ref Range Status   SARS Coronavirus 2 by RT PCR NEGATIVE NEGATIVE Final    Comment: (NOTE) SARS-CoV-2 target nucleic acids are NOT DETECTED.  The SARS-CoV-2 RNA is generally detectable in upper respiratory specimens during the acute phase of infection. The lowest concentration of SARS-CoV-2 viral copies this assay can detect is 138 copies/mL. A negative result does not preclude SARS-Cov-2 infection and should not be used as the sole basis for treatment or other patient management decisions. A negative result may occur with  improper specimen collection/handling, submission of specimen other than nasopharyngeal swab, presence of viral mutation(s) within the areas targeted by this assay, and inadequate number of viral copies(<138 copies/mL). A negative result must be combined with clinical observations, patient history, and epidemiological information. The expected result is Negative.  Fact Sheet for Patients:  bloggercourse.com  Fact Sheet for Healthcare Providers:  seriousbroker.it  This test is no t yet approved or cleared by the [place]  FDA and  has been authorized for detection and/or diagnosis of SARS-CoV-2 by FDA under an Emergency Use Authorization (EUA). This EUA will remain  in effect (meaning this test can be used) for the duration of the COVID-19 declaration under Section 564(b)(1) of the Act, 21 U.S.C.section 360bbb-3(b)(1), unless the authorization is terminated  or revoked sooner.       Influenza A by PCR NEGATIVE NEGATIVE Final   Influenza B by PCR NEGATIVE NEGATIVE Final    Comment: (NOTE) The Xpert Xpress SARS-CoV-2/FLU/RSV plus assay is intended as an aid in the diagnosis of influenza from Nasopharyngeal swab specimens and should not be used as a sole basis for treatment. Nasal washings and aspirates are unacceptable for Xpert Xpress SARS-CoV-2/FLU/RSV testing.  Fact  Sheet for Patients: bloggercourse.com  Fact Sheet for Healthcare Providers: seriousbroker.it  This test is not yet approved or cleared by the [place]  FDA and has been authorized for detection and/or diagnosis of SARS-CoV-2 by FDA under an Emergency Use Authorization (EUA). This EUA will remain in effect (meaning this test can be used) for the duration of the COVID-19 declaration under Section 564(b)(1) of the Act, 21 U.S.C. section 360bbb-3(b)(1), unless the authorization is terminated or revoked.     Resp Syncytial Virus by PCR NEGATIVE NEGATIVE Final    Comment: (NOTE) Fact Sheet for Patients: bloggercourse.com  Fact Sheet for Healthcare Providers: seriousbroker.it  This test is not yet approved or cleared by the [place]  FDA and has been authorized for detection and/or diagnosis of SARS-CoV-2 by FDA under an Emergency Use Authorization (EUA). This EUA will remain in effect (meaning this test can be used) for the duration of the COVID-19 declaration under Section 564(b)(1) of the Act, 21 U.S.C. section 360bbb-3(b)(1), unless the authorization is terminated or revoked.  Performed at [facility], [address] [place], [place] [address]   MRSA Next Gen by PCR, Nasal     Status: None   Collection Time: 12/30/23 11:23  AM   Specimen: Nasal Mucosa; Nasal Swab  Result Value Ref Range Status   MRSA by PCR Next Gen NOT DETECTED NOT DETECTED Final    Comment: (NOTE) The GeneXpert MRSA Assay (FDA approved for NASAL specimens only), is one component of a comprehensive MRSA colonization surveillance program. It is not intended to diagnose MRSA infection nor to guide or monitor treatment for MRSA infections. Test performance is not FDA approved in patients less than 66 years old. Performed at [facility], [address] [place], [place] [address]      Labs: CBC: Recent Labs  Lab 12/30/23 0524 12/31/23 0809 01/01/24 0527 01/02/24 0500  WBC 10.5 13.1* 12.7* 8.7  NEUTROABS 7.3  --   --   --   HGB 12.4 11.3* 11.3* 11.8*  HCT 38.3 34.5* 33.8* 35.5*  MCV 104.6* 102.7* 100.9* 100.0  PLT 307 248 291 285   Basic Metabolic Panel: Recent Labs  Lab 12/30/23 0524 12/31/23 0809 01/01/24 0527 01/02/24 0500  NA 141 136 135 135  K 4.1 5.0 5.1 4.0  CL 101 98 96* 97*  CO2 28 26 30 29   GLUCOSE 103* 117* 94 77  BUN 23 31* 42* 33*  CREATININE 0.91 0.91 0.83 0.88  CALCIUM 8.9 8.8* 8.9 8.5*   Liver Function Tests: Recent Labs  Lab 12/30/23 0524  AST 27  ALT 20  ALKPHOS 63  BILITOT 0.7  PROT 7.0  ALBUMIN 3.9   CBG: No results for input(s): GLUCAP in the last 168 hours.  Discharge time spent: greater than 30 minutes.  Signed: AIDA CHO, MD [facility] 01/03/2024

## 2024-01-03 NOTE — NC FL2 (Signed)
 [place]  MEDICAID FL2 LEVEL OF CARE FORM     IDENTIFICATION  Patient Name: Lynn Norton Birthdate: [birth year] Sex: female Admission Date (Current Location): 12/30/2023  [facility] and [place] Number:  Chiropodist and Address:  [facility], [address], [place], [place] [address]      Provider Number: [id number]  Attending Physician Name and Address:  Jens Durand, MD  Relative Name and Phone Number:  [employer] [facility]) [phone]    Current Level of Care: Hospital Recommended Level of Care: Assisted Living Facility Prior Approval Number:    Date Approved/Denied:   PASRR Number: [id number]  Discharge Plan:  (ALF)    Current Diagnoses: Patient Active Problem List   Diagnosis Date Noted   Chronic hypoxic respiratory failure (HCC) 01/03/2024   COPD exacerbation (HCC) 12/30/2023   Falls, subsequent encounter 12/30/2023   Lightheadedness 10/12/2023   Gait instability 10/12/2023   Closed rib fracture 10/11/2023   Protein-calorie malnutrition, severe 10/11/2023   Acute hyponatremia 10/23/2022   Fall at home, initial encounter 10/22/2022   Encephalopathy 10/22/2022   Alcohol abuse 10/22/2022   UTI (urinary tract infection) 06/13/2021   CKD (chronic kidney disease) stage 4, GFR 15-29 ml/min (HCC) 06/13/2021   Protein-calorie malnutrition, moderate 06/13/2021   Closed fracture of pubic ramus-left  06/13/2021   Lower back pain 06/13/2021   HTN (hypertension) 04/26/2021   Rheumatoid arthritis (HCC) 04/24/2021   Tobacco use 04/24/2021   Bacteremia due to Escherichia coli 04/23/2021   Sepsis (HCC) 04/22/2021   Closed fracture dislocation of right elbow 04/22/2021   AKI (acute kidney injury) 04/22/2021   Hyponatremia 04/22/2021   Frequent falls 04/22/2021   Hypertension 03/16/2018   PAD (peripheral artery disease) 03/16/2018   Tobacco use disorder 08/12/2014   Unspecified osteoarthritis, unspecified site 11/03/2011     Orientation RESPIRATION BLADDER Height & Weight     Self, Place  Normal Continent Weight: 39.5 kg Height:  5' (152.4 cm)  BEHAVIORAL SYMPTOMS/MOOD NEUROLOGICAL BOWEL NUTRITION STATUS      Continent Diet (regular)  AMBULATORY STATUS COMMUNICATION OF NEEDS Skin   Limited Assist Verbally Skin abrasions, Bruising (skin tear- left arm, ecchymosis bilateral lower leg)                       Personal Care Assistance Level of Assistance  Bathing, Dressing Bathing Assistance: Limited assistance   Dressing Assistance: Limited assistance     Functional Limitations Info             SPECIAL CARE FACTORS FREQUENCY  PT (By licensed PT), OT (By licensed OT)     PT Frequency: 3 x week OT Frequency: 3 x week            Contractures Contractures Info: Not present    Additional Factors Info  Code Status, Allergies Code Status Info: DRN- Limited DNI Allergies Info: Other, Sulfa Antibiotics, Ceftriaxone, Penicillins, Rocephin (Ceftriaxone), Sulfa Antibiotics, Penicillins               Discharge Medications:  Allergies as of 01/03/2024         Reactions    Other Itching, Other (See Comments)    Sulfa Antibiotics Other (See Comments), Itching    Ceftriaxone Rash    Penicillins Rash, Other (See Comments)    Rocephin [ceftriaxone] Hives    Spoke with patient. States she had hives that spread in response to rocephin    Sulfa Antibiotics      Penicillins Itching  Medication List       STOP taking these medications     lidocaine  5 % Commonly known as: LIDODERM     multivitamin with minerals Tabs tablet    nicotine  21 mg/24hr patch Commonly known as: NICODERM CQ  - dosed in mg/24 hours    nicotine  polacrilex 2 MG gum Commonly known as: NICORETTE     omeprazole  20 MG tablet Commonly known as: PRILOSEC  OTC    oxyCODONE  5 MG immediate release tablet Commonly known as: Roxicodone     oxyCODONE -acetaminophen  5-325 MG tablet Commonly known as:  PERCOCET/ROXICET    predniSONE  5 MG tablet Commonly known as: DELTASONE     Turmeric 500 MG Caps           TAKE these medications     acetaminophen  325 MG tablet Commonly known as: TYLENOL  Take 650 mg by mouth every 6 (six) hours as needed for mild pain (pain score 1-3) or moderate pain (pain score 4-6).    albuterol  108 (90 Base) MCG/ACT inhaler Commonly known as: VENTOLIN  HFA Inhale 1-2 puffs into the lungs 4 (four) times daily as needed for wheezing or shortness of breath.    amLODipine  10 MG tablet Commonly known as: NORVASC  Take 1 tablet (10 mg total) by mouth daily.    dextromethorphan-guaiFENesin  30-600 MG 12hr tablet Commonly known as: MUCINEX  DM Take 1 tablet by mouth 2 (two) times daily as needed for cough.    docusate sodium  100 MG capsule Commonly known as: COLACE Take 1 capsule (100 mg total) by mouth 2 (two) times daily.    feeding supplement Liqd Take 237 mLs by mouth 2 (two) times daily between meals.    folic acid  1 MG tablet Commonly known as: FOLVITE  Take 1 mg by mouth daily.    ibuprofen  600 MG tablet Commonly known as: ADVIL  Take 1 tablet (600 mg total) by mouth every 8 (eight) hours as needed for mild pain (pain score 1-3) or fever (temp > 38.3 Celsius). What changed: when to take this    methocarbamol  500 MG tablet Commonly known as: ROBAXIN  Take 1 tablet (500 mg total) by mouth every 8 (eight) hours as needed for muscle spasms.    methotrexate  2.5 MG tablet Commonly known as: RHEUMATREX Take 20 mg by mouth every Thursday.    multivitamins ther. w/minerals Tabs tablet Take 1 tablet by mouth daily.    naloxone 4 MG/0.1ML Liqd nasal spray kit Commonly known as: NARCAN Place 1 spray into the nose 3 (three) times daily as needed.    omeprazole  20 MG capsule Commonly known as: PRILOSEC  Take 20 mg by mouth daily.    PARoxetine  10 MG tablet Commonly known as: PAXIL  Take 10 mg by mouth daily.    sodium chloride  1 g tablet Take 1 tablet  (1 g total) by mouth 2 (two) times daily with a meal.    thiamine  100 MG tablet Commonly known as: Vitamin B-1 Take 1 tablet (100 mg total) by mouth daily.    Vitamin D  (Ergocalciferol ) 1.25 MG (50000 UNIT) Caps capsule Commonly known as: DRISDOL Take 1,000 Units by mouth daily.    Please see discharge summary for a list of discharge medications.  Relevant Imaging Results:  Relevant Lab Results:   Additional Information SS# [id number]  Daved JONETTA Hamilton, RN

## 2024-01-03 NOTE — TOC Initial Note (Signed)
 Transition of Care [facility]) - Initial/Assessment Note    Patient Details  Name: Lynn Norton MRN: [id number] Date of Birth: 02/03/1951  Transition of [facility]) CM/SW Contact:    Daved JONETTA Hamilton, RN Phone Number: 01/03/2024, 4:22 PM  Clinical Narrative:                  Patient resides at [facility] and is current with services through [facility]  Expected Discharge Plan: Assisted Living Barriers to Discharge: Barriers Resolved   Patient Goals and CMS Choice            Expected Discharge Plan and Services   Discharge Planning Services: CM Consult   Living arrangements for the past 2 months: Assisted Living Facility Expected Discharge Date: 01/03/24               DME Arranged: Oxygen DME Agency: Other - Comment [facility]) Date DME Agency Contacted: 01/03/24 Time DME Agency Contacted: [phone] Representative spoke with at DME Agency: Rosina Fish            Prior Living Arrangements/Services Living arrangements for the past 2 months: Assisted Living Facility Lives with:: Other (Comment) (ALF) Patient language and need for interpreter reviewed:: Yes Do you feel safe going back to the place where you live?: Yes      Need for Family Participation in Patient Care: No (Comment) Care giver support system in place?: Yes (comment) (patient has services with [facility], lives at ALF) Current home services: Hospice Criminal Activity/Legal Involvement Pertinent to Current Situation/Hospitalization: No - Comment as needed  Activities of Daily Living   ADL Screening (condition at time of admission) Independently performs ADLs?: No Does the patient have a NEW difficulty with bathing/dressing/toileting/self-feeding that is expected to last >3 days?: Yes (Initiates electronic notice to provider for possible OT consult) Does the patient have a NEW difficulty with getting in/out of bed, walking, or climbing stairs that is expected to last >3 days?: Yes (Initiates  electronic notice to provider for possible PT consult) Does the patient have a NEW difficulty with communication that is expected to last >3 days?: Yes (Initiates electronic notice to provider for possible SLP consult) Is the patient deaf or have difficulty hearing?: No Does the patient have difficulty seeing, even when wearing glasses/contacts?: No Does the patient have difficulty concentrating, remembering, or making decisions?: No  Permission Sought/Granted Permission sought to share information with : Case Manager, Magazine Features Editor Permission granted to share information with : Yes, Verbal Permission Granted  Share Information with NAME: TOC, [place], [facility] [employer] granted to share info w AGENCY: WONDA Purchase, [facility] [employer] granted to share info w Relationship: TOC, [place], [facility] [place]     Emotional Assessment Appearance:: Appears stated age Attitude/Demeanor/Rapport: Engaged Affect (typically observed): Appropriate Orientation: : Oriented to Self, Oriented to Place Alcohol / Substance Use: Not Applicable Psych Involvement: No (comment)  Admission diagnosis:  COPD exacerbation (HCC) [J44.1] Acute respiratory failure with hypoxia (HCC) [J96.01] COPD with acute exacerbation (HCC) [J44.1] Right-sided chest pain [R07.9] Multiple fractures of ribs, right side, initial encounter for closed fracture [S22.41XA] Patient Active Problem List   Diagnosis Date Noted   Chronic hypoxic respiratory failure (HCC) 01/03/2024   COPD exacerbation (HCC) 12/30/2023   Falls, subsequent encounter 12/30/2023   Lightheadedness 10/12/2023   Gait instability 10/12/2023   Closed rib fracture 10/11/2023   Protein-calorie malnutrition, severe 10/11/2023   Acute hyponatremia 10/23/2022   Fall at home, initial encounter  10/22/2022   Encephalopathy 10/22/2022   Alcohol abuse 10/22/2022   UTI (urinary tract infection) 06/13/2021   CKD (chronic kidney  disease) stage 4, GFR 15-29 ml/min (HCC) 06/13/2021   Protein-calorie malnutrition, moderate 06/13/2021   Closed fracture of pubic ramus-left  06/13/2021   Lower back pain 06/13/2021   HTN (hypertension) 04/26/2021   Rheumatoid arthritis (HCC) 04/24/2021   Tobacco use 04/24/2021   Bacteremia due to Escherichia coli 04/23/2021   Sepsis (HCC) 04/22/2021   Closed fracture dislocation of right elbow 04/22/2021   AKI (acute kidney injury) 04/22/2021   Hyponatremia 04/22/2021   Frequent falls 04/22/2021   Hypertension 03/16/2018   PAD (peripheral artery disease) 03/16/2018   Tobacco use disorder 08/12/2014   Unspecified osteoarthritis, unspecified site 11/03/2011   PCP:  Pcp, No Pharmacy:   [facility]) - [place], [place] - [address] [facility] [address] [place] [address] [place] [address] Phone: [phone] Fax: [phone]  [id number] - [place], [place] - [address] [place] [address] Phone: [phone] Fax: [phone]  [id number] - [place], [place] - [address] AT [facility] [address] [facility] [address] [place] [place] [address] Phone: [phone] Fax: [phone]     Social Drivers of Health (SDOH) Social History: SDOH Screenings   Food Insecurity: No Food Insecurity (12/30/2023)  Housing: Low Risk  (12/30/2023)  Transportation Needs: No Transportation Needs (12/30/2023)  Utilities: Not At Risk (12/30/2023)  Financial Resource Strain: Low Risk  (06/29/2023)   Received from [facility]  Recent Concern: Financial Resource Strain - High Risk (05/25/2023)   Received from [facility]  Social Connections: Moderately Isolated (12/30/2023)  Tobacco Use: High Risk (12/30/2023)   SDOH Interventions:     Readmission Risk Interventions     No data to display

## 2024-01-03 NOTE — Plan of Care (Signed)
  Problem: Clinical Measurements: Goal: Ability to maintain clinical measurements within normal limits will improve Outcome: Progressing Goal: Will remain free from infection Outcome: Progressing Goal: Diagnostic test results will improve Outcome: Progressing Goal: Respiratory complications will improve Outcome: Progressing Goal: Cardiovascular complication will be avoided Outcome: Progressing   Problem: Activity: Goal: Risk for activity intolerance will decrease Outcome: Progressing   Problem: Nutrition: Goal: Adequate nutrition will be maintained Outcome: Progressing   Problem: Coping: Goal: Level of anxiety will decrease Outcome: Progressing   Problem: Elimination: Goal: Will not experience complications related to bowel motility Outcome: Progressing Goal: Will not experience complications related to urinary retention Outcome: Progressing   Problem: Pain Managment: Goal: General experience of comfort will improve and/or be controlled Outcome: Progressing   Problem: Safety: Goal: Ability to remain free from injury will improve Outcome: Progressing   Problem: Skin Integrity: Goal: Risk for impaired skin integrity will decrease Outcome: Progressing   Problem: Activity: Goal: Ability to tolerate increased activity will improve Outcome: Progressing   Problem: Respiratory: Goal: Ability to maintain a clear airway will improve Outcome: Progressing Goal: Levels of oxygenation will improve Outcome: Progressing Goal: Ability to maintain adequate ventilation will improve Outcome: Progressing

## 2024-01-22 ENCOUNTER — Encounter: Payer: Self-pay | Admitting: Emergency Medicine

## 2024-01-22 ENCOUNTER — Emergency Department

## 2024-01-22 ENCOUNTER — Other Ambulatory Visit: Payer: Self-pay

## 2024-01-22 ENCOUNTER — Emergency Department
Admission: EM | Admit: 2024-01-22 | Discharge: 2024-01-23 | Disposition: A | Discharge status: Home / Self Care | Attending: Emergency Medicine | Admitting: Emergency Medicine

## 2024-01-22 DIAGNOSIS — W19XXXA Unspecified fall, initial encounter: Secondary | ICD-10-CM

## 2024-01-22 DIAGNOSIS — M25562 Pain in left knee: Secondary | ICD-10-CM | POA: Diagnosis not present

## 2024-01-22 DIAGNOSIS — S59912A Unspecified injury of left forearm, initial encounter: Secondary | ICD-10-CM | POA: Diagnosis present

## 2024-01-22 DIAGNOSIS — S0003XA Contusion of scalp, initial encounter: Secondary | ICD-10-CM | POA: Insufficient documentation

## 2024-01-22 DIAGNOSIS — Z23 Encounter for immunization: Secondary | ICD-10-CM | POA: Diagnosis not present

## 2024-01-22 DIAGNOSIS — I129 Hypertensive chronic kidney disease with stage 1 through stage 4 chronic kidney disease, or unspecified chronic kidney disease: Secondary | ICD-10-CM | POA: Insufficient documentation

## 2024-01-22 DIAGNOSIS — W01198A Fall on same level from slipping, tripping and stumbling with subsequent striking against other object, initial encounter: Secondary | ICD-10-CM | POA: Insufficient documentation

## 2024-01-22 DIAGNOSIS — N189 Chronic kidney disease, unspecified: Secondary | ICD-10-CM | POA: Diagnosis not present

## 2024-01-22 DIAGNOSIS — S51812A Laceration without foreign body of left forearm, initial encounter: Secondary | ICD-10-CM | POA: Diagnosis not present

## 2024-01-22 MED ORDER — TETANUS-DIPHTH-ACELL PERTUSSIS 5-2-15.5 LF-MCG/0.5 IM SUSP
0.5000 mL | Freq: Once | INTRAMUSCULAR | Status: AC
  Administered 2024-01-22: 0.5 mL via INTRAMUSCULAR
  Filled 2024-01-22: qty 0.5

## 2024-01-22 MED ORDER — LIDOCAINE HCL (PF) 1 % IJ SOLN
5.0000 mL | Freq: Once | INTRAMUSCULAR | Status: AC
  Administered 2024-01-22: 5 mL
  Filled 2024-01-22: qty 5

## 2024-01-22 NOTE — Discharge Instructions (Signed)
 Please make sure to keep your laceration site dry for 2 days.  Please do not soak your arm in any body of water until you get your sutures removed.  See your primary care doctor to get your sutures removed in 10 days.

## 2024-01-22 NOTE — ED Notes (Addendum)
 This RN called pt's contact Lynn Norton to arrange transport. Norton mentioned that [employer] should take her. Norton provided me with a number to call [phone]

## 2024-01-22 NOTE — ED Provider Notes (Signed)
 [facility] Provider Note    Event Date/Time   First MD Initiated Contact with Patient 01/22/24 1946     (approximate)   History   Fall   HPI  Lynn Norton is a 73 y.o. female history of hypertension, CKD, arthritis, presenting with a fall.  States that she remembers what happens, got up and tripped, fell onto her left side, has a laceration to her left elbow.  She denies any bony tenderness there, no chest pain or shortness of breath, no lightheadedness or prodromal symptoms prior to the fall.  States that she has new bruising to her right knee with mild tenderness to the anterior knee.  Also sustain a hematoma to the left forehead.  No back pain.  No new weakness or numbness.  Per independent history from EMS, she is coming from [facility], staff states that patient got up to go to the restroom, tripped and fell, hit her head.  Has a hematoma to her left side of her head.  No blood thinners.  Has a skin tear to the right forearm and also bruising to the left knee.  Is supposed to be on oxygen, desatted 92% and route and placed on 2 L.  Blood glucose was then was 128.  On independent review, she was admitted in early November for COPD exacerbation, was discharged on 2 L baseline oxygen.  Has had frequent falls in the past, has right sided rib fractures.     Physical Exam   Triage Vital Signs: ED Triage Vitals  Encounter Vitals Group     BP 01/22/24 1934 128/69     Girls Systolic BP Percentile --      Girls Diastolic BP Percentile --      Boys Systolic BP Percentile --      Boys Diastolic BP Percentile --      Pulse Rate 01/22/24 1934 65     Resp 01/22/24 1934 18     Temp 01/22/24 1934 97.7 F (36.5 C)     Temp Source 01/22/24 1934 Oral     SpO2 01/22/24 1934 99 %     Weight 01/22/24 1935 117 lb (53.1 kg)     Height 01/22/24 1935 5' (1.524 m)     Head Circumference --      Peak Flow --      Pain Score 01/22/24 1938 3     Pain Loc --       Pain Education --      Exclude from Growth Chart --     Most recent vital signs: Vitals:   01/22/24 1934  BP: 128/69  Pulse: 65  Resp: 18  Temp: 97.7 F (36.5 C)  SpO2: 99%     General: Awake, no distress.  CV:  Good peripheral perfusion.  Resp:  Normal effort.  No thoracic cage tenderness Abd:  No distention.  Soft nontender Other:  She has a hematoma to the left forehead, no other palpable scalp tenderness or deformities, no facial deformities or tenderness, no midline spinal tenderness, able to fully range all 4 extremities, she does have ecchymoses as well as a 8 cm laceration to her left proximal forearm.  There is no active bleeding at this time.  She has bruising to her left knee with some tenderness on palpation to her anterior knee.  She has bruising to her left shin without bony tenderness, states that this is old.   ED Results / Procedures / Treatments  Labs (all labs ordered are listed, but only abnormal results are displayed) Labs Reviewed - No data to display    RADIOLOGY On my independent interpretation, CT head without obvious intracranial hemorrhage   PROCEDURES:  Critical Care performed: No  .Laceration Repair  Date/Time: 01/22/2024 9:33 PM  Performed by: Waymond Lorelle Cummins, MD Authorized by: Waymond Lorelle Cummins, MD   Laceration details:    Location:  Shoulder/arm   Shoulder/arm location:  L lower arm   Length (cm):  8 Exploration:    Hemostasis achieved with:  Direct pressure   Wound exploration: wound explored through full range of motion   Treatment:    Area cleansed with:  Saline   Amount of cleaning:  Standard   Irrigation solution:  Sterile saline   Irrigation method:  Syringe and pressure wash   Debridement:  None Skin repair:    Repair method:  Sutures   Suture size:  4-0   Suture material:  Nylon   Suture technique:  Simple interrupted   Number of sutures:  10 Approximation:    Approximation:  Close Repair type:    Repair type:   Simple Post-procedure details:    Dressing:  Non-adherent dressing   Procedure completion:  Tolerated well, no immediate complications    MEDICATIONS ORDERED IN ED: Medications  Tdap (ADACEL) injection 0.5 mL (has no administration in time range)  lidocaine  (PF) (XYLOCAINE ) 1 % injection 5 mL (5 mLs Other Given by Other 01/22/24 2132)     IMPRESSION / MDM / ASSESSMENT AND PLAN / ED COURSE  I reviewed the triage vital signs and the nursing notes.                              Differential diagnosis includes, but is not limited to, mechanical fall, consider fracture, dislocation, contusion, strain, sprain, intracranial hemorrhage.  Will get CT head, cervical spine, x-rays of her arm and her knee.  Tdap.  Will plan to suture her laceration.  Patient's presentation is most consistent with acute presentation with potential threat to life or bodily function.  Independent interpretation of imaging below.  On reassessment patient is asymptomatic at this time.  Did discuss with her about imaging results including incidental findings.  Her laceration was repaired.  Discussed with her about post laceration care.  Instructed her to follow-up primary care to get her sutures removed in 10 days.  Otherwise considered but no indication for inpatient admission at this time, she is safe for outpatient management.  She is satting 99% on room air.  Discharge with strict return precautions.    Clinical Course as of 01/22/24 2134  [place] Jan 22, 2024  2037 DG Forearm Left IMPRESSION: 1. No definite acute fracture or dislocation. 2. Remote fractures at the bases of the 3rd and 4th proximal phalanxes and distal radial metadiaphysis. 3. Advanced degenerative changes throughout the proximal hand/wrist, most marked at the [facility] and STT joints.   [TT]  2037 DG Elbow Complete Left 1. No acute abnormality.  [TT]  2038 DG Knee Complete 4 Views Left IMPRESSION: 1. No acute osseous abnormality. 2. Lateral  compartment arthroplasty without complication. 3. 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   [TT]  2041 CT Cervical Spine Wo Contrast 1. No acute abnormality of the cervical spine.  [TT]  2041 CT Head Wo Contrast IMPRESSION: 1. No acute intracranial abnormality. 2. Left frontal and posterior parietal scalp hematomas.   [TT]  Clinical Course User Index [TT] Waymond Lorelle Cummins, MD     FINAL CLINICAL IMPRESSION(S) / ED DIAGNOSES   Final diagnoses:  Fall, initial encounter  Hematoma of scalp, initial encounter  Laceration of left forearm, initial encounter  Acute pain of left knee     Rx / DC Orders   ED Discharge Orders     None        Note:  This document was prepared using Dragon voice recognition software and may include unintentional dictation errors.    Waymond Lorelle Cummins, MD 01/22/24 [phone]

## 2024-01-22 NOTE — ED Notes (Signed)
 This RN attempted to call [facility] [place] to arrange transport for this pt. No answer at this time and was unable to leave a voicemail

## 2024-01-22 NOTE — ED Notes (Signed)
 This RN called [facility] at this time. I spoke with Clancy Silvan, Med Tech. I gave Silvan a report on the patient and inquired about transportation. Silvan said transportation will not be available until 0800.

## 2024-01-22 NOTE — ED Notes (Addendum)
 This RN called pt's POA [employer]. Mclver stated he would not be able to pick pt up at this time. Mclver said he would be available early morning if needed.

## 2024-01-22 NOTE — ED Triage Notes (Signed)
 Pt arrives via [facility] from the [place] [facility] Per their report, the staff called out got up to go the restroom, tripped on the floor, hit head, unknown LOC. Hematoma to the left side of the head. No blood thinner use. Skin tear to the left forearm area. Left knee pain with bruising. Able to stand and pivot with assist. En route vitals 142/65, hr 74. Pt wears O2 PRN, initial sats 99, dropped to 92 en route, applied 2liters- O2 improved to 96%, 128 cbg

## 2024-01-23 NOTE — ED Notes (Signed)
 Called [facility] spoke with Pam; transport is here to pickup pt

## 2024-01-26 ENCOUNTER — Other Ambulatory Visit: Payer: Self-pay

## 2024-01-26 ENCOUNTER — Emergency Department
Admission: EM | Admit: 2024-01-26 | Discharge: 2024-01-26 | Disposition: A | Discharge status: Home / Self Care | Attending: Emergency Medicine | Admitting: Emergency Medicine

## 2024-01-26 DIAGNOSIS — W19XXXA Unspecified fall, initial encounter: Secondary | ICD-10-CM | POA: Diagnosis not present

## 2024-01-26 DIAGNOSIS — S51012D Laceration without foreign body of left elbow, subsequent encounter: Secondary | ICD-10-CM | POA: Insufficient documentation

## 2024-01-26 DIAGNOSIS — T8130XA Disruption of wound, unspecified, initial encounter: Secondary | ICD-10-CM

## 2024-01-26 DIAGNOSIS — T8131XA Disruption of external operation (surgical) wound, not elsewhere classified, initial encounter: Secondary | ICD-10-CM | POA: Diagnosis not present

## 2024-01-26 DIAGNOSIS — S59902D Unspecified injury of left elbow, subsequent encounter: Secondary | ICD-10-CM | POA: Diagnosis present

## 2024-01-26 NOTE — ED Notes (Signed)
 Attempted to call pt's POA [employer], no answer at this time.

## 2024-01-26 NOTE — ED Notes (Signed)
 Patient assisted patient to hallway bathroom and back. Patient did void.

## 2024-01-26 NOTE — ED Notes (Signed)
 This RN assisted pt to wheelchair to and potty and back to bed.

## 2024-01-26 NOTE — ED Notes (Addendum)
 This RN attempted to call the [facility] to give report to nursing staff, no answer at this time, no voicemail in place to leave a message.

## 2024-01-26 NOTE — ED Notes (Signed)
 This RN called the [facility]. Gave report. They advised their transport person leaves at 3pm. This patient assisted pt to wheelchair and to bathroom and back to bed and was given some water.

## 2024-01-26 NOTE — ED Triage Notes (Addendum)
 BIBEMS from the [facility] d/t unwitnessed fall. Pt denies LOC, hitting head, and denies blood thinners. Pt complaining of L elbow pain/ bleeding, bleeding controlled w/ bandage. Stiches on L elbow from previous fall, pt believes they may have ripped some stitches out when they fell. Pt is on hospice care d/t frequent falls per facility.  EMS VS:  128/69, 54 HR, 94% on RA

## 2024-01-26 NOTE — ED Notes (Signed)
 [facility] called spoke with Lynn Norton regarding transfer back to [facility]

## 2024-01-26 NOTE — ED Provider Notes (Signed)
 [facility] Provider Note   Event Date/Time   First MD Initiated Contact with Patient 01/26/24 1309     (approximate) History  Fall  HPI Lynn Norton is a 73 y.o. female who presents via EMS after a mechanical fall from standing in which the patient states she lost her balance.  Patient denies any head trauma or loss of consciousness.  Patient states that she has falls like this frequently and is the reason she has a laceration to the left elbow that she is concerned about here today.  Patient states that she fell again on the left side and feels that she has popped some of the stitches.  Patient had significant amount of bleeding per staff and EMS however it is hemostatic at this time. ROS: Patient currently denies any vision changes, tinnitus, difficulty speaking, facial droop, sore throat, chest pain, shortness of breath, abdominal pain, nausea/vomiting/diarrhea, dysuria, or weakness/numbness/paresthesias in any extremity   Physical Exam  Triage Vital Signs: ED Triage Vitals  Encounter Vitals Group     BP 01/26/24 1311 131/71     Girls Systolic BP Percentile --      Girls Diastolic BP Percentile --      Boys Systolic BP Percentile --      Boys Diastolic BP Percentile --      Pulse Rate 01/26/24 1311 83     Resp 01/26/24 1311 20     Temp 01/26/24 1311 97.8 F (36.6 C)     Temp Source 01/26/24 1311 Oral     SpO2 01/26/24 1311 97 %     Weight 01/26/24 1313 96 lb (43.5 kg)     Height 01/26/24 1313 5' (1.524 m)     Head Circumference --      Peak Flow --      Pain Score 01/26/24 1312 3     Pain Loc --      Pain Education --      Exclude from Growth Chart --    Most recent vital signs: Vitals:   01/26/24 1311  BP: 131/71  Pulse: 83  Resp: 20  Temp: 97.8 F (36.6 C)  SpO2: 97%   General: Awake, oriented x4. CV:  Good peripheral perfusion. Resp:  Normal effort. Abd:  No distention. Other:  Elderly well-developed, well-nourished Caucasian  female resting comfortably in no acute distress.  There is a previously sutured curvilinear laceration over the posterior aspect of the left elbow that is approximately 5 cm in length.  The mid aspect of this laceration does have a small amount (1-2 cm) of dehiscence ED Results / Procedures / Treatments  Labs (all labs ordered are listed, but only abnormal results are displayed) Labs Reviewed - No data to display  PROCEDURES: Critical Care performed: No Procedures MEDICATIONS ORDERED IN ED: Medications - No data to display IMPRESSION / MDM / ASSESSMENT AND PLAN / ED COURSE  I reviewed the triage vital signs and the nursing notes.                             The patient is on the cardiac monitor to evaluate for evidence of arrhythmia and/or significant heart rate changes. Patient's presentation is most consistent with acute presentation with potential threat to life or bodily function. Patient is a 73 year old female who presents after mechanical fall onto the left elbow suffering a small wound dehiscence.  Patient's laceration was repaired at bedside with Steri-Strips as  patient's initial injury was over 24 hours prior to arrival.  I discussed this plan with patient who agreed.  Patient does not meet criteria for head CT at this time as she does not have any head trauma or loss of consciousness.  Patient has had multiple falls over the last few years and therefore I did not feel strongly about additional workup for this patient. Patient was given strict return precautions and all questions were answered prior to discharge.  Dispo: Discharge home with PCP follow-up   FINAL CLINICAL IMPRESSION(S) / ED DIAGNOSES   Final diagnoses:  Fall, initial encounter  Wound dehiscence  Elbow laceration, left, subsequent encounter   Rx / DC Orders   ED Discharge Orders     None      Note:  This document was prepared using Dragon voice recognition software and may include unintentional dictation  errors.   Camaryn Lumbert K, MD 01/26/24 [phone]

## 2024-02-02 ENCOUNTER — Emergency Department

## 2024-02-02 ENCOUNTER — Other Ambulatory Visit: Payer: Self-pay

## 2024-02-02 ENCOUNTER — Emergency Department: Admission: EM | Admit: 2024-02-02 | Discharge: 2024-02-03 | Disposition: A | Discharge status: Home / Self Care

## 2024-02-02 DIAGNOSIS — M542 Cervicalgia: Secondary | ICD-10-CM | POA: Insufficient documentation

## 2024-02-02 DIAGNOSIS — T8130XA Disruption of wound, unspecified, initial encounter: Secondary | ICD-10-CM | POA: Insufficient documentation

## 2024-02-02 DIAGNOSIS — N189 Chronic kidney disease, unspecified: Secondary | ICD-10-CM | POA: Insufficient documentation

## 2024-02-02 DIAGNOSIS — W1830XA Fall on same level, unspecified, initial encounter: Secondary | ICD-10-CM | POA: Diagnosis not present

## 2024-02-02 DIAGNOSIS — W19XXXA Unspecified fall, initial encounter: Secondary | ICD-10-CM

## 2024-02-02 DIAGNOSIS — S0990XA Unspecified injury of head, initial encounter: Secondary | ICD-10-CM

## 2024-02-02 DIAGNOSIS — M25512 Pain in left shoulder: Secondary | ICD-10-CM | POA: Diagnosis not present

## 2024-02-02 DIAGNOSIS — I129 Hypertensive chronic kidney disease with stage 1 through stage 4 chronic kidney disease, or unspecified chronic kidney disease: Secondary | ICD-10-CM | POA: Diagnosis not present

## 2024-02-02 DIAGNOSIS — T07XXXA Unspecified multiple injuries, initial encounter: Secondary | ICD-10-CM

## 2024-02-02 DIAGNOSIS — S0083XA Contusion of other part of head, initial encounter: Secondary | ICD-10-CM | POA: Insufficient documentation

## 2024-02-02 NOTE — ED Triage Notes (Signed)
 Pt arrives via [facility] from the [facility] [place] where they had an unwitnessed fall and hit their head, there is a visible hematoma, and is holding their left elbow where pt has had recent trauma and per first nurse's note, pt had some stiches popped loose. Pt is a hospice pt and on 2L chronically. Pt is A&Ox3 but does have some confusion about timeline of events today.

## 2024-02-02 NOTE — Discharge Instructions (Signed)
 Follow-up with your regular doctor as needed.  Keep the area where the laceration opened is dry as possible.  Steri-Strips will fall off on their own

## 2024-02-02 NOTE — ED Notes (Signed)
 Charge RN made aware patient does not have a ride back to her facility tonight.

## 2024-02-02 NOTE — ED Provider Notes (Signed)
 [facility] Provider Note    Event Date/Time   First MD Initiated Contact with Patient 02/02/24 1619     (approximate)   History   Fall   HPI  Lynn Norton is a 73 y.o. female history of RA, CKD, hypertension presents emergency department after a fall.  Patient lives at the [place] [facility] [address]  The fall was unwitnessed.  States she did hit her head as she has a large hematoma to the back of the head.  Also complaining of left elbow and left shoulder pain.  No LOC.      Physical Exam   Triage Vital Signs: ED Triage Vitals  Encounter Vitals Group     BP 02/02/24 1418 (!) 84/65     Girls Systolic BP Percentile --      Girls Diastolic BP Percentile --      Boys Systolic BP Percentile --      Boys Diastolic BP Percentile --      Pulse Rate 02/02/24 1418 75     Resp 02/02/24 1418 17     Temp 02/02/24 1418 97.6 F (36.4 C)     Temp Source 02/02/24 1418 Oral     SpO2 02/02/24 1418 100 %     Weight 02/02/24 1426 100 lb (45.4 kg)     Height 02/02/24 1426 5' (1.524 m)     Head Circumference --      Peak Flow --      Pain Score 02/02/24 1419 0     Pain Loc --      Pain Education --      Exclude from Growth Chart --     Most recent vital signs: Vitals:   02/02/24 1429 02/02/24 1736  BP: 129/61 (!) 147/61  Pulse:  75  Resp:  18  Temp:  97.8 F (36.6 C)  SpO2:  99%     General: Awake, no distress.   CV:  Good peripheral perfusion.  Resp:  Normal effort.  Abd:  No distention.   Other:  Left shoulder tender, left elbow with sutures noted, some tenderness, 1 area of the sutures is opened, no bleeding noted.  Back of the skull is large hematoma, C-spine mildly tender, cranial nerves II through XII grossly intact, patient is able to answer all questions.   ED Results / Procedures / Treatments   Labs (all labs ordered are listed, but only abnormal results are displayed) Labs Reviewed - No data to display   EKG     RADIOLOGY CT head,  C-spine, x-ray left shoulder and left elbow    PROCEDURES:   Procedures  Critical Care:  no Chief Complaint  Patient presents with   Fall      MEDICATIONS ORDERED IN ED: Medications - No data to display   IMPRESSION / MDM / ASSESSMENT AND PLAN / ED COURSE  I reviewed the triage vital signs and the nursing notes.                              Differential diagnosis includes, but is not limited to, subdural, SAH, contusion, hematoma, fracture, laceration, dehiscence of wound  Patient's presentation is most consistent with acute illness / injury with system symptoms.   X-ray left shoulder, left elbow independent review interpretation by me as being negative for any acute abnormality  CT C-spine, CT head   CT C-spine, CT head, independent review interpretation by me as  being negative for any acute abnormality  I did explain findings to the patient.  Steri-Strips were applied to the area of dehiscence on the elbow.  This should help secure the area so that it will not continue to tear.  Nursing staff to apply bandage.  Nursing staff to call for transport for the patient back to the [place].  The patient is in agreement with all the treatment plan.  She is discharged stable condition.  I do not see any red flags to warrant any further workup.   FINAL CLINICAL IMPRESSION(S) / ED DIAGNOSES   Final diagnoses:  Fall, initial encounter  Minor head injury, initial encounter  Multiple contusions     Rx / DC Orders   ED Discharge Orders     None        Note:  This document was prepared using Dragon voice recognition software and may include unintentional dictation errors.    Gasper Devere ORN, PA-C 02/02/24 1820    Clarine Ozell LABOR, MD 02/03/24 [phone]

## 2024-02-02 NOTE — ED Notes (Signed)
Dressing applied to left elbow.

## 2024-02-02 NOTE — ED Triage Notes (Signed)
 First Nurse Note:  Pt via [facility] from [facility]. Pt had an unwitnessed fall. Pt has a hematoma to the back and L elbow stitches have popped loose. Denies any blood thinner. Pt is a hospice pt. Pt is A&Ox4 and NAD, 2L [place] chronically.

## 2024-02-03 NOTE — ED Notes (Signed)
 This tech assisted pt to toilet and up to sink to brush teeth. Pt is back in bed with no further needs.

## 2024-02-03 NOTE — ED Notes (Signed)
 Just received a call from [facility] [place]. Informed that transportation is at an appt with another resident. Transportation to call this RN when on the way to pick up pt.

## 2024-02-03 NOTE — ED Notes (Signed)
 This RN has attempted multiple calls to facility about coming to pick up pt from ER, with no response.

## 2024-02-03 NOTE — ED Notes (Signed)
 This tech assisted pt. to the restroom at this time.

## 2024-02-03 NOTE — ED Notes (Signed)
 This RN attempted once again to contact transportation services with [facility] to pick up pt. Left message with facility.

## 2024-02-17 ENCOUNTER — Emergency Department
Admission: EM | Admit: 2024-02-17 | Discharge: 2024-02-17 | Disposition: A | Discharge status: Home / Self Care | Attending: Emergency Medicine | Admitting: Emergency Medicine

## 2024-02-17 ENCOUNTER — Emergency Department

## 2024-02-17 ENCOUNTER — Other Ambulatory Visit: Payer: Self-pay

## 2024-02-17 DIAGNOSIS — I129 Hypertensive chronic kidney disease with stage 1 through stage 4 chronic kidney disease, or unspecified chronic kidney disease: Secondary | ICD-10-CM | POA: Insufficient documentation

## 2024-02-17 DIAGNOSIS — W19XXXA Unspecified fall, initial encounter: Secondary | ICD-10-CM | POA: Diagnosis not present

## 2024-02-17 DIAGNOSIS — N189 Chronic kidney disease, unspecified: Secondary | ICD-10-CM | POA: Diagnosis not present

## 2024-02-17 DIAGNOSIS — S51012A Laceration without foreign body of left elbow, initial encounter: Secondary | ICD-10-CM | POA: Diagnosis not present

## 2024-02-17 DIAGNOSIS — S51812A Laceration without foreign body of left forearm, initial encounter: Secondary | ICD-10-CM | POA: Insufficient documentation

## 2024-02-17 DIAGNOSIS — R82998 Other abnormal findings in urine: Secondary | ICD-10-CM | POA: Diagnosis not present

## 2024-02-17 DIAGNOSIS — S59902A Unspecified injury of left elbow, initial encounter: Secondary | ICD-10-CM | POA: Diagnosis present

## 2024-02-17 DIAGNOSIS — S0990XA Unspecified injury of head, initial encounter: Secondary | ICD-10-CM | POA: Diagnosis not present

## 2024-02-17 DIAGNOSIS — J449 Chronic obstructive pulmonary disease, unspecified: Secondary | ICD-10-CM | POA: Diagnosis not present

## 2024-02-17 LAB — URINALYSIS, W/ REFLEX TO CULTURE (INFECTION SUSPECTED)
Bilirubin Urine: NEGATIVE
Glucose, UA: NEGATIVE mg/dL
Ketones, ur: NEGATIVE mg/dL
Nitrite: NEGATIVE
Protein, ur: 100 mg/dL — AB
Specific Gravity, Urine: 1.014 (ref 1.005–1.030)
pH: 5 (ref 5.0–8.0)

## 2024-02-17 LAB — CBC WITH DIFFERENTIAL/PLATELET
Abs Immature Granulocytes: 0.08 K/uL — ABNORMAL HIGH (ref 0.00–0.07)
Basophils Absolute: 0 K/uL (ref 0.0–0.1)
Basophils Relative: 0 %
Eosinophils Absolute: 0 K/uL (ref 0.0–0.5)
Eosinophils Relative: 0 %
HCT: 32.8 % — ABNORMAL LOW (ref 36.0–46.0)
Hemoglobin: 10.8 g/dL — ABNORMAL LOW (ref 12.0–15.0)
Immature Granulocytes: 0 %
Lymphocytes Relative: 3 %
Lymphs Abs: 0.4 K/uL — ABNORMAL LOW (ref 0.7–4.0)
MCH: 34.1 pg — ABNORMAL HIGH (ref 26.0–34.0)
MCHC: 32.9 g/dL (ref 30.0–36.0)
MCV: 103.5 fL — ABNORMAL HIGH (ref 80.0–100.0)
Monocytes Absolute: 1.2 K/uL — ABNORMAL HIGH (ref 0.1–1.0)
Monocytes Relative: 7 %
Neutro Abs: 16.1 K/uL — ABNORMAL HIGH (ref 1.7–7.7)
Neutrophils Relative %: 90 %
Platelets: 236 K/uL (ref 150–400)
RBC: 3.17 MIL/uL — ABNORMAL LOW (ref 3.87–5.11)
RDW: 15.9 % — ABNORMAL HIGH (ref 11.5–15.5)
WBC: 17.8 K/uL — ABNORMAL HIGH (ref 4.0–10.5)
nRBC: 0 % (ref 0.0–0.2)

## 2024-02-17 LAB — COMPREHENSIVE METABOLIC PANEL WITH GFR
ALT: 26 U/L (ref 0–44)
AST: 51 U/L — ABNORMAL HIGH (ref 15–41)
Albumin: 4 g/dL (ref 3.5–5.0)
Alkaline Phosphatase: 71 U/L (ref 38–126)
Anion gap: 12 (ref 5–15)
BUN: 23 mg/dL (ref 8–23)
CO2: 26 mmol/L (ref 22–32)
Calcium: 8.7 mg/dL — ABNORMAL LOW (ref 8.9–10.3)
Chloride: 101 mmol/L (ref 98–111)
Creatinine, Ser: 0.87 mg/dL (ref 0.44–1.00)
GFR, Estimated: >60 mL/min
Glucose, Bld: 107 mg/dL — ABNORMAL HIGH (ref 70–99)
Potassium: 3.9 mmol/L (ref 3.5–5.1)
Sodium: 138 mmol/L (ref 135–145)
Total Bilirubin: 0.3 mg/dL (ref 0.0–1.2)
Total Protein: 6.5 g/dL (ref 6.5–8.1)

## 2024-02-17 MED ORDER — IPRATROPIUM-ALBUTEROL 0.5-2.5 (3) MG/3ML IN SOLN
3.0000 mL | Freq: Once | RESPIRATORY_TRACT | Status: AC
  Administered 2024-02-17: 3 mL via RESPIRATORY_TRACT
  Filled 2024-02-17: qty 3

## 2024-02-17 MED ORDER — ASPIRIN 81 MG PO TBEC
81.0000 mg | DELAYED_RELEASE_TABLET | Freq: Once | ORAL | Status: AC
  Administered 2024-02-17: 81 mg via ORAL
  Filled 2024-02-17: qty 1

## 2024-02-17 NOTE — ED Notes (Signed)
 [facility] called for transport to [facility] , spoke with [employer]

## 2024-02-17 NOTE — ED Provider Notes (Signed)
 "  [facility] Provider Note   Event Date/Time   First MD Initiated Contact with Patient 02/17/24 1135     (approximate) History  Fall  HPI Lynn Norton is a 73 y.o. 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  Staff at her long-term care facility found her on the ground this morning with a small skin tear to the left forearm and left shoulder redness.  EMS found patient to be in the low 80s on room air that improved on 2 L nasal cannula and was given 1 DuoNeb treatment.  Patient denies any complaints at this time and states that she did not fall ROS: Patient currently denies any vision changes, tinnitus, difficulty speaking, facial droop, sore throat, chest pain, shortness of breath, abdominal pain, nausea/vomiting/diarrhea, dysuria, or weakness/numbness/paresthesias in any extremity   Physical Exam  Triage Vital Signs: ED Triage Vitals  Encounter Vitals Group     BP      Girls Systolic BP Percentile      Girls Diastolic BP Percentile      Boys Systolic BP Percentile      Boys Diastolic BP Percentile      Pulse      Resp      Temp      Temp src      SpO2      Weight      Height      Head Circumference      Peak Flow      Pain Score      Pain Loc      Pain Education      Exclude from Growth Chart    Most recent vital signs: Vitals:   02/17/24 1140  BP: 124/74  Pulse: 87  Resp: 20  Temp: 98.1 F (36.7 C)  SpO2: 100%   General: Awake, oriented x4. CV:  Good peripheral perfusion. Resp:  Normal effort.  Expiratory wheezing over bilateral lung fields Abd:  No distention. Other:  Elderly well-developed, well-nourished Caucasian female resting comfortably in no acute distress.  3 cm curvilinear skin tear to the left dorsal forearm ED Results / Procedures / Treatments  Labs (all labs ordered are listed, but only abnormal  results are displayed) Labs Reviewed  COMPREHENSIVE METABOLIC PANEL WITH GFR - Abnormal; Notable for the following components:      Result Value   Glucose, Bld 107 (*)    Calcium 8.7 (*)    AST 51 (*)    All other components within normal limits  CBC WITH DIFFERENTIAL/PLATELET - Abnormal; Notable for the following components:   WBC 17.8 (*)    RBC 3.17 (*)    Hemoglobin 10.8 (*)    HCT 32.8 (*)    MCV 103.5 (*)    MCH 34.1 (*)    RDW 15.9 (*)    Neutro Abs 16.1 (*)    Lymphs Abs 0.4 (*)    Monocytes Absolute 1.2 (*)    Abs Immature Granulocytes 0.08 (*)    All other components within normal limits  URINALYSIS, W/ REFLEX TO CULTURE (INFECTION SUSPECTED)   EKG ED ECG REPORT I, Artist MARLA Kerns, the attending physician, personally viewed and interpreted this ECG. Date: 02/17/2024 EKG Time: 1154 Rate: 107 Rhythm: Tachycardic sinus rhythm QRS Axis: normal Intervals: Right bundle branch block ST/T Wave abnormalities: normal Narrative Interpretation: Tachycardic sinus rhythm with  RBBB.  No evidence of acute ischemia RADIOLOGY ED MD interpretation: CT of the head without contrast interpreted by me shows no evidence of acute abnormalities including no intracerebral hemorrhage, obvious masses, or significant edema CT of the cervical spine interpreted by me does not show any evidence of acute abnormalities including no acute fracture, malalignment, height loss, or dislocation X-ray of the chest and left shoulder does not show any evidence of acute abnormalities - All radiology independently interpreted and agree with radiology assessment Official radiology report(s): CT Head Wo Contrast Result Date: 02/17/2024 EXAM: CT HEAD AND CERVICAL SPINE 02/17/2024 12:34:11 PM TECHNIQUE: CT of the head and cervical spine was performed without the administration of intravenous contrast. Multiplanar reformatted images are provided for review. Automated exposure control, iterative reconstruction,  and/or weight based adjustment of the mA/kV was utilized to reduce the radiation dose to as low as reasonably achievable. COMPARISON: CT head and cervical spine 02/02/2024. CLINICAL HISTORY: Head trauma, minor (Age >= 65y). FINDINGS: CT HEAD BRAIN AND VENTRICLES: No acute intracranial hemorrhage. No mass effect or midline shift. No abnormal extra-axial fluid collection. No evidence of acute infarct. No hydrocephalus. 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 ORBITS: No acute abnormality. SINUSES AND MASTOIDS: No acute abnormality. SOFT TISSUES AND SKULL: Minimal residual soft tissue swelling along the right parietal scalp. No acute skull fracture. CT CERVICAL SPINE BONES AND ALIGNMENT: Unchanged 3 mm degenerative anterolisthesis of C4 on C5. No acute fracture or traumatic malalignment. DEGENERATIVE CHANGES: Unchanged multilevel cervical degenerative disc disease without high grade spinal canal stenosis. SOFT TISSUES: No prevertebral soft tissue swelling. IMPRESSION: 1. No acute intracranial abnormality. 2. No acute fracture or traumatic malalignment of the cervical spine. 3. 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 Electronically signed by: Ryan Chess MD 02/17/2024 12:41 PM EST RP Workstation: [id number]   CT Cervical Spine Wo Contrast Result Date: 02/17/2024 EXAM: CT HEAD AND CERVICAL SPINE 02/17/2024 12:34:11 PM TECHNIQUE: CT of the head and cervical spine was performed without the administration of intravenous contrast. Multiplanar reformatted images are provided for review. Automated exposure control, iterative reconstruction, and/or weight based adjustment of the mA/kV was utilized to reduce the radiation dose to as low as reasonably achievable. COMPARISON: CT head and cervical spine 02/02/2024. CLINICAL HISTORY: Head trauma, minor (Age >= 65y). FINDINGS: CT HEAD BRAIN AND VENTRICLES: No acute intracranial hemorrhage. No mass effect or midline shift. No abnormal  extra-axial fluid collection. No evidence of acute infarct. No hydrocephalus. 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 ORBITS: No acute abnormality. SINUSES AND MASTOIDS: No acute abnormality. SOFT TISSUES AND SKULL: Minimal residual soft tissue swelling along the right parietal scalp. No acute skull fracture. CT CERVICAL SPINE BONES AND ALIGNMENT: Unchanged 3 mm degenerative anterolisthesis of C4 on C5. No acute fracture or traumatic malalignment. DEGENERATIVE CHANGES: Unchanged multilevel cervical degenerative disc disease without high grade spinal canal stenosis. SOFT TISSUES: No prevertebral soft tissue swelling. IMPRESSION: 1. No acute intracranial abnormality. 2. No acute fracture or traumatic malalignment of the cervical spine. 3. 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 Electronically signed by: Ryan Chess MD 02/17/2024 12:41 PM EST RP Workstation: [id number]   DG Chest Port 1 View Result Date: 02/17/2024 EXAM: 1 VIEW(S) XRAY OF THE CHEST 02/17/2024 12:25:00 PM COMPARISON: 11/11/2023 CLINICAL HISTORY: SHOB FINDINGS: LUNGS AND PLEURA: No focal pulmonary opacity. No pleural effusion. No pneumothorax. HEART AND MEDIASTINUM: Aortic atherosclerotic calcification. No acute abnormality of the cardiac and mediastinal silhouettes. BONES AND SOFT TISSUES: Right shoulder  arthroplasty. Remote right lower rib fractures. Chronic left shoulder deformity. IMPRESSION: 1. No acute cardiopulmonary abnormality. 2. Aortic Atherosclerosis (ICD10-I70.0). Electronically signed by: Ryan Chess MD 02/17/2024 12:32 PM EST RP Workstation: [id number]   DG Shoulder Left Result Date: 02/17/2024 EXAM: 4 VIEW(S) XRAY OF THE LEFT SHOULDER 02/17/2024 12:08:59 PM COMPARISON: 02/02/2024 CLINICAL HISTORY: Fall w/ shoulder injury FINDINGS: BONES AND JOINTS: Superior subluxation of humeral head abutting the undersurface of the acromion. Advanced degenerative changes of glenohumeral joint. No acute  fracture. The [facility] joint is unremarkable. SOFT TISSUES: No abnormal calcifications. Visualized lung is unremarkable. IMPRESSION: 1. No acute fracture. 2. Superior subluxation of the humeral head abutting the undersurface of the acromion. 3. Advanced degenerative changes of the glenohumeral joint. Electronically signed by: Ryan Chess MD 02/17/2024 12:31 PM EST RP Workstation: [id number]   PROCEDURES: Critical Care performed: No .Laceration Repair  Date/Time: 02/17/2024 1:23 PM  Performed by: Jossie Artist POUR, MD Authorized by: Jossie Artist POUR, MD   Consent:    Consent obtained:  Verbal   Consent given by:  Patient   Risks, benefits, and alternatives were discussed: yes     Risks discussed:  Infection, pain, retained foreign body, need for additional repair, poor cosmetic result, tendon damage, vascular damage, poor wound healing and nerve damage   Alternatives discussed:  No treatment, delayed treatment, observation and referral Universal protocol:    Immediately prior to procedure, a time out was called: yes     Patient identity confirmed:  Verbally with patient Anesthesia:    Anesthesia method:  Local infiltration Laceration details:    Location:  Shoulder/arm   Shoulder/arm location:  L lower arm   Length (cm):  3   Depth (mm):  3 Pre-procedure details:    Preparation:  Patient was prepped and draped in usual sterile fashion Exploration:    Wound exploration: entire depth of wound visualized     Contaminated: no   Treatment:    Area cleansed with:  Povidone-iodine and saline   Amount of cleaning:  Standard   Irrigation solution:  Sterile saline   Irrigation method:  Syringe Skin repair:    Repair method:  Steri-Strips   Number of Steri-Strips:  4 Approximation:    Approximation:  Close Repair type:    Repair type:  Simple Post-procedure details:    Dressing:  Antibiotic ointment and non-adherent dressing   Procedure completion:  Tolerated well, no immediate  complications .Laceration Repair  Date/Time: 02/17/2024 1:23 PM  Performed by: Jossie Artist POUR, MD Authorized by: Jossie Artist POUR, MD   Consent:    Consent obtained:  Verbal   Consent given by:  Patient   Risks, benefits, and alternatives were discussed: yes     Risks discussed:  Infection, pain, retained foreign body, need for additional repair, poor cosmetic result, tendon damage, vascular damage, poor wound healing and nerve damage   Alternatives discussed:  No treatment, delayed treatment, observation and referral Universal protocol:    Immediately prior to procedure, a time out was called: yes     Patient identity confirmed:  Verbally with patient Anesthesia:    Anesthesia method:  Local infiltration Laceration details:    Location:  Shoulder/arm   Shoulder/arm location:  L elbow   Length (cm):  3   Depth (mm):  3 Pre-procedure details:    Preparation:  Patient was prepped and draped in usual sterile fashion Exploration:    Wound exploration: entire depth of wound visualized     Contaminated: no  Treatment:    Area cleansed with:  Povidone-iodine and saline   Amount of cleaning:  Standard   Irrigation solution:  Sterile saline   Irrigation method:  Syringe Skin repair:    Repair method:  Steri-Strips   Number of Steri-Strips:  5 Approximation:    Approximation:  Close Post-procedure details:    Dressing:  Antibiotic ointment and non-adherent dressing   Procedure completion:  Tolerated well, no immediate complications  MEDICATIONS ORDERED IN ED: Medications - No data to display IMPRESSION / MDM / ASSESSMENT AND PLAN / ED COURSE  I reviewed the triage vital signs and the nursing notes.                             The patient is on the cardiac monitor to evaluate for evidence of arrhythmia and/or significant heart rate changes. Patient's presentation is most consistent with acute presentation with potential threat to life or bodily function. Patient is a  73 year old female who presents after a presumed fall suffering a skin tear to the left forearm and left shoulder injury DDx: Intracranial hemorrhage, humerus fracture, skin tear, UTI Plan: CBC, CMP, urinalysis, x-ray of the chest and shoulder, head CT, neck CT  Patient did not show any evidence of acute abnormalities and radiologic studies.  Patient's breathing is at her baseline per patient's son who is now here stating that she has recurrent falls.  Patient's skin tears repaired at bedside without any complication.  Patient is reportedly up-to-date on her tetanus vaccination.  Patient agrees with plan for discharge home at this time with outpatient PCP follow-up as needed.  Patient given strict return precautions and all questions answered prior to discharge  Dispo: Discharge home with PCP follow-up as needed   FINAL CLINICAL IMPRESSION(S) / ED DIAGNOSES   Final diagnoses:  Fall, initial encounter  Skin tear of left forearm without complication, initial encounter  Skin tear of left elbow without complication, initial encounter   Rx / DC Orders   ED Discharge Orders     None      Note:  This document was prepared using Dragon voice recognition software and may include unintentional dictation errors.   Jossie Artist POUR, MD 02/17/24 1325  "

## 2024-02-17 NOTE — ED Notes (Signed)
 At this time, this EDT, helped this pt use the BR in the room. Pt needed moderate assistance walking and had trouble following directions. Pt got back to bed with moderate assist and is now resting, pt has no other needs at this time. Call light within reach & bed alarm is on.

## 2024-02-17 NOTE — ED Triage Notes (Addendum)
 Pt arrives via [facility] from the [place] [facility] [place] for a possible fall. Staff at the [facility] stated pt fell but pt denies falling. Staff also states pt has a history of dementia. Pt arrives with knot on R side of head, skin tear on L forearm, and L shoulder is red. EMS heard wheezing and found pt in lows 80s Spo2 on baseline 2L O2. EMS gave 1 albuterol /duoneb treatment. No know blood thinners.  EMS vitals: 98.46F 119 CBG

## 2024-02-17 NOTE — ED Notes (Signed)
 [employer], pts emergecy contact and healthcare POA (as listed in chart) is on their way to pick pt up and take her to the [place] of [address]

## 2024-02-18 LAB — URINE CULTURE: Culture: NO GROWTH

## 2024-03-25 DEATH — deceased
